# Patient Record
Sex: Male | Born: 1975
Health system: Southern US, Community
[De-identification: ages and names within clinical notes are randomized; demographics above are authoritative.]

## PROBLEM LIST (undated history)

## (undated) ENCOUNTER — Emergency Department (HOSPITAL_COMMUNITY): Discharge status: Against Medical Advice | Payer: Self-pay

## (undated) DIAGNOSIS — G4730 Sleep apnea, unspecified: Secondary | ICD-10-CM

## (undated) DIAGNOSIS — R519 Headache, unspecified: Secondary | ICD-10-CM

## (undated) DIAGNOSIS — N179 Acute kidney failure, unspecified: Secondary | ICD-10-CM

## (undated) DIAGNOSIS — F250 Schizoaffective disorder, bipolar type: Secondary | ICD-10-CM

## (undated) DIAGNOSIS — D352 Benign neoplasm of pituitary gland: Secondary | ICD-10-CM

## (undated) DIAGNOSIS — R569 Unspecified convulsions: Secondary | ICD-10-CM

## (undated) DIAGNOSIS — E119 Type 2 diabetes mellitus without complications: Secondary | ICD-10-CM

## (undated) DIAGNOSIS — I10 Essential (primary) hypertension: Secondary | ICD-10-CM

## (undated) DIAGNOSIS — F1910 Other psychoactive substance abuse, uncomplicated: Secondary | ICD-10-CM

## (undated) DIAGNOSIS — F209 Schizophrenia, unspecified: Secondary | ICD-10-CM

## (undated) DIAGNOSIS — B1920 Unspecified viral hepatitis C without hepatic coma: Secondary | ICD-10-CM

## (undated) DIAGNOSIS — M1990 Unspecified osteoarthritis, unspecified site: Secondary | ICD-10-CM

## (undated) DIAGNOSIS — R51 Headache: Secondary | ICD-10-CM

## (undated) DIAGNOSIS — E220 Acromegaly and pituitary gigantism: Secondary | ICD-10-CM

## (undated) HISTORY — PX: SKIN BIOPSY: SHX1

## (undated) HISTORY — DX: Type 2 diabetes mellitus without complications: E11.9

## (undated) HISTORY — DX: Benign neoplasm of pituitary gland: D35.2

## (undated) HISTORY — PX: PITUITARY SURGERY: SHX203

## (undated) HISTORY — DX: Unspecified osteoarthritis, unspecified site: M19.90

## (undated) HISTORY — DX: Sleep apnea, unspecified: G47.30

---

## 1993-08-30 DIAGNOSIS — F259 Schizoaffective disorder, unspecified: Secondary | ICD-10-CM

## 1993-08-30 DIAGNOSIS — G4730 Sleep apnea, unspecified: Secondary | ICD-10-CM

## 1993-08-30 HISTORY — DX: Sleep apnea, unspecified: G47.30

## 1993-08-30 HISTORY — DX: Schizoaffective disorder, unspecified: F25.9

## 2002-01-12 ENCOUNTER — Emergency Department: Admission: EM | Admit: 2002-01-12 | Discharge: 2002-01-13 | Payer: Self-pay | Admitting: *Deleted

## 2002-01-16 ENCOUNTER — Encounter: Payer: Self-pay | Admitting: *Deleted

## 2002-01-16 ENCOUNTER — Ambulatory Visit (HOSPITAL_COMMUNITY): Admission: RE | Admit: 2002-01-16 | Discharge: 2002-01-16 | Payer: Self-pay | Admitting: *Deleted

## 2002-01-22 ENCOUNTER — Encounter: Admission: RE | Admit: 2002-01-22 | Discharge: 2002-01-22 | Payer: Self-pay | Admitting: Internal Medicine

## 2002-02-12 ENCOUNTER — Encounter: Admission: RE | Admit: 2002-02-12 | Discharge: 2002-02-12 | Payer: Self-pay | Admitting: Internal Medicine

## 2002-03-01 ENCOUNTER — Encounter: Admission: RE | Admit: 2002-03-01 | Discharge: 2002-03-01 | Payer: Self-pay | Admitting: Internal Medicine

## 2002-04-11 ENCOUNTER — Encounter: Admission: RE | Admit: 2002-04-11 | Discharge: 2002-04-11 | Payer: Self-pay | Admitting: Internal Medicine

## 2002-05-27 ENCOUNTER — Emergency Department (HOSPITAL_COMMUNITY): Admission: EM | Admit: 2002-05-27 | Discharge: 2002-05-27 | Payer: Self-pay | Admitting: Emergency Medicine

## 2002-06-06 ENCOUNTER — Encounter: Admission: RE | Admit: 2002-06-06 | Discharge: 2002-06-06 | Payer: Self-pay | Admitting: Internal Medicine

## 2002-08-30 DIAGNOSIS — E220 Acromegaly and pituitary gigantism: Secondary | ICD-10-CM

## 2002-08-30 DIAGNOSIS — D352 Benign neoplasm of pituitary gland: Secondary | ICD-10-CM

## 2002-08-30 HISTORY — DX: Acromegaly and pituitary gigantism: E22.0

## 2002-08-30 HISTORY — DX: Benign neoplasm of pituitary gland: D35.2

## 2003-03-10 ENCOUNTER — Inpatient Hospital Stay (HOSPITAL_COMMUNITY): Admission: AD | Admit: 2003-03-10 | Discharge: 2003-03-12 | Payer: Self-pay | Admitting: Psychiatry

## 2003-06-26 ENCOUNTER — Emergency Department (HOSPITAL_COMMUNITY): Admission: EM | Admit: 2003-06-26 | Discharge: 2003-06-26 | Payer: Self-pay | Admitting: Emergency Medicine

## 2003-09-07 ENCOUNTER — Emergency Department (HOSPITAL_COMMUNITY): Admission: EM | Admit: 2003-09-07 | Discharge: 2003-09-07 | Payer: Self-pay | Admitting: Emergency Medicine

## 2005-04-30 ENCOUNTER — Ambulatory Visit: Payer: Self-pay | Admitting: Internal Medicine

## 2005-05-03 ENCOUNTER — Ambulatory Visit: Payer: Self-pay | Admitting: *Deleted

## 2005-12-08 DIAGNOSIS — E220 Acromegaly and pituitary gigantism: Secondary | ICD-10-CM | POA: Insufficient documentation

## 2005-12-23 DIAGNOSIS — E2740 Unspecified adrenocortical insufficiency: Secondary | ICD-10-CM | POA: Insufficient documentation

## 2008-08-30 DIAGNOSIS — E119 Type 2 diabetes mellitus without complications: Secondary | ICD-10-CM

## 2008-08-30 HISTORY — DX: Type 2 diabetes mellitus without complications: E11.9

## 2011-05-09 ENCOUNTER — Emergency Department (HOSPITAL_COMMUNITY): Payer: Self-pay

## 2011-05-09 ENCOUNTER — Emergency Department (HOSPITAL_COMMUNITY)
Admission: EM | Admit: 2011-05-09 | Discharge: 2011-05-09 | Disposition: A | Discharge status: Home / Self Care | Payer: Self-pay | Attending: Emergency Medicine | Admitting: Emergency Medicine

## 2011-05-09 DIAGNOSIS — IMO0001 Reserved for inherently not codable concepts without codable children: Secondary | ICD-10-CM | POA: Insufficient documentation

## 2011-05-09 DIAGNOSIS — I10 Essential (primary) hypertension: Secondary | ICD-10-CM | POA: Insufficient documentation

## 2011-05-09 DIAGNOSIS — R51 Headache: Secondary | ICD-10-CM | POA: Insufficient documentation

## 2011-05-09 DIAGNOSIS — N390 Urinary tract infection, site not specified: Secondary | ICD-10-CM | POA: Insufficient documentation

## 2011-05-09 DIAGNOSIS — E220 Acromegaly and pituitary gigantism: Secondary | ICD-10-CM | POA: Insufficient documentation

## 2011-05-09 DIAGNOSIS — G4730 Sleep apnea, unspecified: Secondary | ICD-10-CM | POA: Insufficient documentation

## 2011-05-09 DIAGNOSIS — E119 Type 2 diabetes mellitus without complications: Secondary | ICD-10-CM | POA: Insufficient documentation

## 2011-05-09 LAB — POCT I-STAT, CHEM 8
BUN: 14 mg/dL (ref 6–23)
Calcium, Ion: 1.22 mmol/L (ref 1.12–1.32)
Chloride: 108 mEq/L (ref 96–112)
Creatinine, Ser: 1 mg/dL (ref 0.50–1.35)
Glucose, Bld: 106 mg/dL — ABNORMAL HIGH (ref 70–99)
HCT: 44 % (ref 39.0–52.0)
Hemoglobin: 15 g/dL (ref 13.0–17.0)
Potassium: 4 mEq/L (ref 3.5–5.1)
Sodium: 143 mEq/L (ref 135–145)
TCO2: 24 mmol/L (ref 0–100)

## 2011-05-09 LAB — CBC
HCT: 40.1 % (ref 39.0–52.0)
Hemoglobin: 12.7 g/dL — ABNORMAL LOW (ref 13.0–17.0)
MCH: 23.2 pg — ABNORMAL LOW (ref 26.0–34.0)
MCHC: 31.7 g/dL (ref 30.0–36.0)
MCV: 73.2 fL — ABNORMAL LOW (ref 78.0–100.0)
Platelets: 201 10*3/uL (ref 150–400)
RBC: 5.48 MIL/uL (ref 4.22–5.81)
RDW: 14.7 % (ref 11.5–15.5)
WBC: 6.8 10*3/uL (ref 4.0–10.5)

## 2011-05-09 LAB — URINALYSIS, ROUTINE W REFLEX MICROSCOPIC
Glucose, UA: NEGATIVE mg/dL
Hgb urine dipstick: NEGATIVE
Ketones, ur: 15 mg/dL — AB
Nitrite: POSITIVE — AB
Protein, ur: NEGATIVE mg/dL
Specific Gravity, Urine: 1.035 — ABNORMAL HIGH (ref 1.005–1.030)
Urobilinogen, UA: 1 mg/dL (ref 0.0–1.0)
pH: 6 (ref 5.0–8.0)

## 2011-05-09 LAB — DIFFERENTIAL
Basophils Absolute: 0 10*3/uL (ref 0.0–0.1)
Basophils Relative: 1 % (ref 0–1)
Eosinophils Absolute: 0.2 10*3/uL (ref 0.0–0.7)
Eosinophils Relative: 3 % (ref 0–5)
Lymphocytes Relative: 27 % (ref 12–46)
Lymphs Abs: 1.8 10*3/uL (ref 0.7–4.0)
Monocytes Absolute: 0.5 10*3/uL (ref 0.1–1.0)
Monocytes Relative: 8 % (ref 3–12)
Neutro Abs: 4.2 10*3/uL (ref 1.7–7.7)
Neutrophils Relative %: 61 % (ref 43–77)

## 2011-05-09 LAB — URINE MICROSCOPIC-ADD ON

## 2011-06-29 ENCOUNTER — Emergency Department (HOSPITAL_COMMUNITY)
Admission: EM | Admit: 2011-06-29 | Discharge: 2011-06-29 | Disposition: A | Discharge status: Home / Self Care | Payer: Self-pay | Attending: Emergency Medicine | Admitting: Emergency Medicine

## 2011-06-29 ENCOUNTER — Emergency Department (HOSPITAL_COMMUNITY): Payer: Self-pay

## 2011-06-29 ENCOUNTER — Encounter (HOSPITAL_COMMUNITY): Payer: Self-pay | Admitting: Radiology

## 2011-06-29 DIAGNOSIS — E220 Acromegaly and pituitary gigantism: Secondary | ICD-10-CM | POA: Insufficient documentation

## 2011-06-29 DIAGNOSIS — E119 Type 2 diabetes mellitus without complications: Secondary | ICD-10-CM | POA: Insufficient documentation

## 2011-06-29 DIAGNOSIS — R51 Headache: Secondary | ICD-10-CM | POA: Insufficient documentation

## 2011-06-29 DIAGNOSIS — R42 Dizziness and giddiness: Secondary | ICD-10-CM | POA: Insufficient documentation

## 2011-06-29 DIAGNOSIS — I10 Essential (primary) hypertension: Secondary | ICD-10-CM | POA: Insufficient documentation

## 2011-06-29 DIAGNOSIS — Z79899 Other long term (current) drug therapy: Secondary | ICD-10-CM | POA: Insufficient documentation

## 2011-06-29 HISTORY — DX: Essential (primary) hypertension: I10

## 2011-06-29 HISTORY — DX: Acromegaly and pituitary gigantism: E22.0

## 2011-08-11 ENCOUNTER — Emergency Department (HOSPITAL_COMMUNITY)
Admission: EM | Admit: 2011-08-11 | Discharge: 2011-08-11 | Payer: Self-pay | Attending: Emergency Medicine | Admitting: Emergency Medicine

## 2011-08-11 ENCOUNTER — Encounter (HOSPITAL_COMMUNITY): Payer: Self-pay | Admitting: Emergency Medicine

## 2011-08-11 DIAGNOSIS — S61209A Unspecified open wound of unspecified finger without damage to nail, initial encounter: Secondary | ICD-10-CM | POA: Insufficient documentation

## 2011-08-11 DIAGNOSIS — S61219A Laceration without foreign body of unspecified finger without damage to nail, initial encounter: Secondary | ICD-10-CM

## 2011-08-11 DIAGNOSIS — R69 Illness, unspecified: Secondary | ICD-10-CM | POA: Insufficient documentation

## 2011-08-11 DIAGNOSIS — S41109A Unspecified open wound of unspecified upper arm, initial encounter: Secondary | ICD-10-CM | POA: Insufficient documentation

## 2011-08-11 DIAGNOSIS — IMO0002 Reserved for concepts with insufficient information to code with codable children: Secondary | ICD-10-CM | POA: Insufficient documentation

## 2011-08-11 DIAGNOSIS — S41119A Laceration without foreign body of unspecified upper arm, initial encounter: Secondary | ICD-10-CM

## 2011-08-11 MED ORDER — TETANUS-DIPHTH-ACELL PERTUSSIS 5-2.5-18.5 LF-MCG/0.5 IM SUSP
0.5000 mL | Freq: Once | INTRAMUSCULAR | Status: AC
  Administered 2011-08-11: 0.5 mL via INTRAMUSCULAR
  Filled 2011-08-11: qty 0.5
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
## 2011-08-11 NOTE — ED Notes (Signed)
MD at bedside. 
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
## 2011-08-12 ENCOUNTER — Emergency Department (HOSPITAL_COMMUNITY): Payer: Self-pay

## 2011-08-12 ENCOUNTER — Emergency Department (HOSPITAL_COMMUNITY)
Admission: EM | Admit: 2011-08-12 | Discharge: 2011-08-12 | Disposition: A | Discharge status: Home / Self Care | Payer: Self-pay | Attending: Emergency Medicine | Admitting: Emergency Medicine

## 2011-08-12 ENCOUNTER — Encounter (HOSPITAL_COMMUNITY): Payer: Self-pay | Admitting: *Deleted

## 2011-08-12 DIAGNOSIS — I10 Essential (primary) hypertension: Secondary | ICD-10-CM | POA: Insufficient documentation

## 2011-08-12 DIAGNOSIS — S8010XA Contusion of unspecified lower leg, initial encounter: Secondary | ICD-10-CM | POA: Insufficient documentation

## 2011-08-12 DIAGNOSIS — M25469 Effusion, unspecified knee: Secondary | ICD-10-CM | POA: Insufficient documentation

## 2011-08-12 DIAGNOSIS — IMO0002 Reserved for concepts with insufficient information to code with codable children: Secondary | ICD-10-CM | POA: Insufficient documentation

## 2011-08-12 DIAGNOSIS — S8990XA Unspecified injury of unspecified lower leg, initial encounter: Secondary | ICD-10-CM | POA: Insufficient documentation

## 2011-08-12 DIAGNOSIS — E119 Type 2 diabetes mellitus without complications: Secondary | ICD-10-CM | POA: Insufficient documentation

## 2011-08-12 DIAGNOSIS — Z86011 Personal history of benign neoplasm of the brain: Secondary | ICD-10-CM | POA: Insufficient documentation

## 2011-08-12 DIAGNOSIS — E220 Acromegaly and pituitary gigantism: Secondary | ICD-10-CM | POA: Insufficient documentation

## 2011-08-12 DIAGNOSIS — M25569 Pain in unspecified knee: Secondary | ICD-10-CM | POA: Insufficient documentation

## 2011-08-12 MED ORDER — HYDROCODONE-ACETAMINOPHEN 5-325 MG PO TABS: 1.0000 | ORAL_TABLET | Freq: Four times a day (QID) | ORAL | Status: DC | PRN
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
## 2011-08-12 NOTE — ED Notes (Signed)
Right knee swollen and painful, skin tear to the left upper arm, c/o left rib pain
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
## 2011-08-14 ENCOUNTER — Encounter (HOSPITAL_COMMUNITY): Payer: Self-pay

## 2011-08-14 ENCOUNTER — Emergency Department (HOSPITAL_COMMUNITY): Payer: Self-pay

## 2011-08-14 ENCOUNTER — Emergency Department (HOSPITAL_COMMUNITY)
Admission: EM | Admit: 2011-08-14 | Discharge: 2011-08-14 | Disposition: A | Discharge status: Home / Self Care | Payer: Self-pay | Attending: Emergency Medicine | Admitting: Emergency Medicine

## 2011-08-14 DIAGNOSIS — S8990XA Unspecified injury of unspecified lower leg, initial encounter: Secondary | ICD-10-CM | POA: Insufficient documentation

## 2011-08-14 DIAGNOSIS — M25569 Pain in unspecified knee: Secondary | ICD-10-CM | POA: Insufficient documentation

## 2011-08-14 DIAGNOSIS — M2540 Effusion, unspecified joint: Secondary | ICD-10-CM | POA: Insufficient documentation

## 2011-08-14 DIAGNOSIS — S8991XA Unspecified injury of right lower leg, initial encounter: Secondary | ICD-10-CM

## 2011-08-14 DIAGNOSIS — M7989 Other specified soft tissue disorders: Secondary | ICD-10-CM | POA: Insufficient documentation

## 2011-08-14 DIAGNOSIS — S99929A Unspecified injury of unspecified foot, initial encounter: Secondary | ICD-10-CM | POA: Insufficient documentation

## 2011-08-14 DIAGNOSIS — R079 Chest pain, unspecified: Secondary | ICD-10-CM | POA: Insufficient documentation

## 2011-08-14 DIAGNOSIS — S20219A Contusion of unspecified front wall of thorax, initial encounter: Secondary | ICD-10-CM | POA: Insufficient documentation

## 2011-08-14 DIAGNOSIS — E119 Type 2 diabetes mellitus without complications: Secondary | ICD-10-CM | POA: Insufficient documentation

## 2011-08-14 DIAGNOSIS — E220 Acromegaly and pituitary gigantism: Secondary | ICD-10-CM | POA: Insufficient documentation

## 2011-08-14 DIAGNOSIS — I10 Essential (primary) hypertension: Secondary | ICD-10-CM | POA: Insufficient documentation

## 2011-08-14 MED ORDER — OXYCODONE-ACETAMINOPHEN 5-325 MG PO TABS: 2.0000 | ORAL_TABLET | ORAL | Status: DC | PRN

## 2011-08-14 MED ORDER — OXYCODONE-ACETAMINOPHEN 5-325 MG PO TABS: 2.0000 | ORAL_TABLET | ORAL | Status: AC | PRN

## 2011-08-14 MED ORDER — OXYCODONE-ACETAMINOPHEN 5-325 MG PO TABS
2.0000 | ORAL_TABLET | Freq: Once | ORAL | Status: AC
  Administered 2011-08-14: 2 via ORAL
  Filled 2011-08-14: qty 2

## 2011-08-14 NOTE — ED Notes (Signed)
Dr Radford Pax aware pt called RN to room requesting Percocet 7.5mg  for pain.
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
## 2014-10-21 ENCOUNTER — Ambulatory Visit: Payer: Self-pay | Attending: Family Medicine | Admitting: Family Medicine

## 2014-10-21 ENCOUNTER — Encounter: Payer: Self-pay | Admitting: Family Medicine

## 2014-10-21 VITALS — BP 133/101 | HR 85 | Temp 98.2°F | Resp 20 | Ht 75.0 in | Wt 339.0 lb

## 2014-10-21 DIAGNOSIS — F32A Depression, unspecified: Secondary | ICD-10-CM

## 2014-10-21 DIAGNOSIS — M1990 Unspecified osteoarthritis, unspecified site: Secondary | ICD-10-CM

## 2014-10-21 DIAGNOSIS — IMO0002 Reserved for concepts with insufficient information to code with codable children: Secondary | ICD-10-CM | POA: Insufficient documentation

## 2014-10-21 DIAGNOSIS — G4730 Sleep apnea, unspecified: Secondary | ICD-10-CM

## 2014-10-21 DIAGNOSIS — E220 Acromegaly and pituitary gigantism: Secondary | ICD-10-CM

## 2014-10-21 DIAGNOSIS — Z6841 Body Mass Index (BMI) 40.0 and over, adult: Secondary | ICD-10-CM

## 2014-10-21 DIAGNOSIS — E119 Type 2 diabetes mellitus without complications: Secondary | ICD-10-CM

## 2014-10-21 DIAGNOSIS — F329 Major depressive disorder, single episode, unspecified: Secondary | ICD-10-CM

## 2014-10-21 DIAGNOSIS — IMO0001 Reserved for inherently not codable concepts without codable children: Secondary | ICD-10-CM

## 2014-10-21 DIAGNOSIS — R030 Elevated blood-pressure reading, without diagnosis of hypertension: Secondary | ICD-10-CM

## 2014-10-21 DIAGNOSIS — I10 Essential (primary) hypertension: Secondary | ICD-10-CM

## 2014-10-21 DIAGNOSIS — E1165 Type 2 diabetes mellitus with hyperglycemia: Secondary | ICD-10-CM | POA: Insufficient documentation

## 2014-10-21 LAB — LIPID PANEL
Cholesterol: 146 mg/dL (ref 0–200)
HDL: 33 mg/dL — ABNORMAL LOW (ref >=40)
LDL Cholesterol: 96 mg/dL (ref 0–99)
Total CHOL/HDL Ratio: 4.4 Ratio
Triglycerides: 84 mg/dL (ref <150)
VLDL: 17 mg/dL (ref 0–40)

## 2014-10-21 LAB — CBC
HCT: 43.9 % (ref 39.0–52.0)
Hemoglobin: 13.6 g/dL (ref 13.0–17.0)
MCH: 23.2 pg — ABNORMAL LOW (ref 26.0–34.0)
MCHC: 31 g/dL (ref 30.0–36.0)
MCV: 74.9 fL — ABNORMAL LOW (ref 78.0–100.0)
MPV: 10.1 fL (ref 8.6–12.4)
Platelets: 228 10*3/uL (ref 150–400)
RBC: 5.86 MIL/uL — ABNORMAL HIGH (ref 4.22–5.81)
RDW: 15.2 % (ref 11.5–15.5)
WBC: 7.5 10*3/uL (ref 4.0–10.5)

## 2014-10-21 LAB — COMPLETE METABOLIC PANEL WITH GFR
ALT: 17 U/L (ref 0–53)
AST: 19 U/L (ref 0–37)
Albumin: 4 g/dL (ref 3.5–5.2)
Alkaline Phosphatase: 54 U/L (ref 39–117)
BUN: 16 mg/dL (ref 6–23)
CO2: 25 mEq/L (ref 19–32)
Calcium: 9.2 mg/dL (ref 8.4–10.5)
Chloride: 103 mEq/L (ref 96–112)
Creat: 0.88 mg/dL (ref 0.50–1.35)
GFR, Est African American: >89 mL/min
GFR, Est Non African American: >89 mL/min
Glucose, Bld: 144 mg/dL — ABNORMAL HIGH (ref 70–99)
Potassium: 4.3 mEq/L (ref 3.5–5.3)
Sodium: 137 mEq/L (ref 135–145)
Total Bilirubin: 0.7 mg/dL (ref 0.2–1.2)
Total Protein: 6.3 g/dL (ref 6.0–8.3)

## 2014-10-21 LAB — POCT GLYCOSYLATED HEMOGLOBIN (HGB A1C): Hemoglobin A1C: 8.1

## 2014-10-21 LAB — GLUCOSE, POCT (MANUAL RESULT ENTRY): POC Glucose: 157 mg/dl — AB (ref 70–99)

## 2014-10-21 MED ORDER — GABAPENTIN 400 MG PO CAPS: 400.0000 mg | ORAL_CAPSULE | Freq: Three times a day (TID) | ORAL | Status: DC

## 2014-10-21 MED ORDER — PREDNISONE 5 MG PO TABS: 7.5000 mg | ORAL_TABLET | Freq: Every day | ORAL | Status: DC

## 2014-10-21 MED ORDER — CLONIDINE HCL 0.1 MG PO TABS
0.1000 mg | ORAL_TABLET | Freq: Once | ORAL | Status: AC
Start: 2014-10-21 — End: 2014-10-21
  Administered 2014-10-21: 0.1 mg via ORAL

## 2014-10-21 MED ORDER — FUROSEMIDE 40 MG PO TABS: 40.0000 mg | ORAL_TABLET | Freq: Every day | ORAL | Status: DC

## 2014-10-21 MED ORDER — LOSARTAN POTASSIUM 50 MG PO TABS: 50.0000 mg | ORAL_TABLET | Freq: Every day | ORAL | Status: DC

## 2014-10-21 MED ORDER — METFORMIN HCL 500 MG PO TABS: 500.0000 mg | ORAL_TABLET | Freq: Two times a day (BID) | ORAL | Status: DC

## 2014-10-21 MED ORDER — SERTRALINE HCL 100 MG PO TABS: 100.0000 mg | ORAL_TABLET | Freq: Every day | ORAL | Status: DC

## 2014-10-21 MED ORDER — VERAPAMIL HCL ER 240 MG PO TBCR: 240.0000 mg | EXTENDED_RELEASE_TABLET | Freq: Every day | ORAL | Status: DC

## 2014-10-21 MED ORDER — MELOXICAM 15 MG PO TABS: 15.0000 mg | ORAL_TABLET | Freq: Every day | ORAL | Status: DC

## 2014-10-21 NOTE — Assessment & Plan Note (Signed)
Sleep apnea: will obtain records. Getting a new machine w/o insurance will take some time. Please apply for [employer] discount and orange card.

## 2014-10-21 NOTE — Assessment & Plan Note (Addendum)
DM2: A1c goal < 7 Increase metformin to 500 mg twice daily after meals Low carb diet Regular exercise

## 2014-10-21 NOTE — Assessment & Plan Note (Signed)
Patient signed release for medical records

## 2014-10-21 NOTE — Patient Instructions (Addendum)
Mr. Nicolosi,  Thank you for coming in today. It was a pleasure meeting you. I look forward to being your primary doctor.  1. HTN: BP goal < 140/90 Continue lasix, verapamil Add losartan 50 mg daily for BP control If needed I will add back the clonidine since you took it before.   2. DM2: A1c goal < 7 Increase metformin to 500 mg twice daily after meals Low carb diet Regular exercise   3. Sleep apnea: will obtain records. Getting a new machine w/o insurance will take some time. Please apply for [employer] discount and orange card.   Please apply for [employer] discount and orange card, you can also inquire if any of your medications are on the PASS (medications assistance) list.  Abilify is on PASS, so please get a PASS application from the pharmacy.   F/u in 2 weeks for RN BP check F/u with me in 6 weeks  Dr. Adrian Blackwater

## 2014-10-21 NOTE — Assessment & Plan Note (Signed)
HTN: BP goal < 140/90 Continue lasix, verapamil Add losartan 50 mg daily for BP control If needed I will add back the clonidine since you took it before.

## 2014-10-21 NOTE — Progress Notes (Signed)
   Subjective:    Patient ID: Duane Salazar, male    DOB: Mar 13, 1976, 39 y.o.   MRN: [id number] CC: establish care, continue medications, need CPAPhx of acromegaly, DJD, HTN, DM2 HPI 38 yo M establish care:  1. 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 Diagnosis and treatment in [place]. Records at [facility]. Patient on chronic steroid therapy with prednisone 7.5 mg daily since treatment.   2. DM2: taking metformin 500 mg daily. Denies HA, CP, SOB. Has polyuria and polydipsia at times.   3. HTN: taking lasix 40 mg daily and verapamil 240 mg daily. Also took clonidine 0.3 mg BID while incarcerated. ROS as per above.   4. Sleep apnea: without CPAP since release from jail 2 weeks ago.   Soc Hx: current smoker 8 cigs per day  Med Hx: acromegaly dx in 2004  Fam Hx: cancer of unknown type in maternal uncle and MGM Review of Systems As per HPI     Objective:   Physical Exam BP 136/101 mmHg  Pulse 79  Temp(Src) 98.2 F (36.8 C)  Resp 20  Ht 6\' 3"  (1.905 m)  Wt 339 lb (153.769 kg)  BMI 42.37 kg/m2  SpO2 99% General appearance: alert, cooperative and no distress  Lungs: clear to auscultation bilaterally Heart: regular rate and rhythm, S1, S2 normal, no murmur, click, rub or gallop Extremities: extremities normal, atraumatic, no cyanosis or edema  Treated HTN with clonidine 0.1 mg x 1, repeat 133/101 Lab Results  Component Value Date   HGBA1C 8.1 10/21/2014   CBG 152    Assessment & Plan:

## 2014-10-22 LAB — MICROALBUMIN / CREATININE URINE RATIO
Creatinine, Urine: 238 mg/dL
Microalb Creat Ratio: 16 mg/g (ref 0.0–30.0)
Microalb, Ur: 3.8 mg/dL — ABNORMAL HIGH (ref <2.0)

## 2014-10-22 MED ORDER — ATORVASTATIN CALCIUM 40 MG PO TABS: 40.0000 mg | ORAL_TABLET | Freq: Every day | ORAL | Status: DC

## 2014-10-22 NOTE — Addendum Note (Signed)
Addended by: Boykin Nearing on: 10/22/2014 11:17 AM   Modules accepted: Orders

## 2014-10-24 ENCOUNTER — Telehealth: Payer: Self-pay | Admitting: *Deleted

## 2014-10-24 NOTE — Telephone Encounter (Signed)
-----   Message from Minerva Ends, MD sent at 10/22/2014 11:16 AM EST ----- Normal urine microalbumin, Normal hgb with slightly low MCV, ? Iron deficiency  Recommend adding statin therapy, otherwise continue current medication regimen

## 2014-10-24 NOTE — Telephone Encounter (Signed)
Left voice message with male to return call 

## 2014-10-25 NOTE — Telephone Encounter (Signed)
Pt aware of lab results 

## 2014-11-12 ENCOUNTER — Ambulatory Visit: Payer: Self-pay

## 2014-12-19 ENCOUNTER — Ambulatory Visit: Payer: Self-pay | Attending: Family Medicine | Admitting: *Deleted

## 2014-12-19 ENCOUNTER — Other Ambulatory Visit: Payer: Self-pay

## 2014-12-19 ENCOUNTER — Telehealth: Payer: Self-pay | Admitting: Family Medicine

## 2014-12-19 VITALS — BP 148/107 | HR 81 | Temp 98.0°F | Resp 16 | Wt 322.6 lb

## 2014-12-19 DIAGNOSIS — R55 Syncope and collapse: Secondary | ICD-10-CM | POA: Insufficient documentation

## 2014-12-19 LAB — POCT URINALYSIS DIPSTICK
Bilirubin, UA: NEGATIVE
Blood, UA: NEGATIVE
Glucose, UA: NEGATIVE
Ketones, UA: NEGATIVE
Leukocytes, UA: NEGATIVE
Nitrite, UA: NEGATIVE
Protein, UA: NEGATIVE
Spec Grav, UA: 1.015
Urobilinogen, UA: 0.2
pH, UA: 6

## 2014-12-19 LAB — GLUCOSE, POCT (MANUAL RESULT ENTRY): POC Glucose: 105 mg/dl — AB (ref 70–99)

## 2014-12-19 NOTE — Telephone Encounter (Signed)
Pt returning call for nurse, pt not sure who called him.  Please f/u with pt, it is regarding upcoming imaging appt.
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
## 2014-12-19 NOTE — Patient Instructions (Addendum)
DASH Eating Plan DASH stands for "Dietary Approaches to Stop Hypertension." The DASH eating plan is a healthy eating plan that has been shown to reduce high blood pressure (hypertension). Additional health benefits may include reducing the risk of type 2 diabetes mellitus, heart disease, and stroke. The DASH eating plan may also help with weight loss. WHAT DO I NEED TO KNOW ABOUT THE DASH EATING PLAN? For the DASH eating plan, you will follow these general guidelines:  Choose foods with a percent daily value for sodium of less than 5% (as listed on the food label).  Use salt-free seasonings or herbs instead of table salt or sea salt.  Check with your health care provider or pharmacist before using salt substitutes.  Eat lower-sodium products, often labeled as "lower sodium" or "no salt added."  Eat fresh foods.  Eat more vegetables, fruits, and low-fat dairy products.  Choose whole grains. Look for the word "whole" as the first word in the ingredient list.  Choose fish and skinless chicken or turkey more often than red meat. Limit fish, poultry, and meat to 6 oz (170 g) each day.  Limit sweets, desserts, sugars, and sugary drinks.  Choose heart-healthy fats.  Limit cheese to 1 oz (28 g) per day.  Eat more home-cooked food and less restaurant, buffet, and fast food.  Limit fried foods.  Cook foods using methods other than frying.  Limit canned vegetables. If you do use them, rinse them well to decrease the sodium.  When eating at a restaurant, ask that your food be prepared with less salt, or no salt if possible. WHAT FOODS CAN I EAT? Seek help from a dietitian for individual calorie needs. Grains Whole grain or whole wheat bread. Brown rice. Whole grain or whole wheat pasta. Quinoa, bulgur, and whole grain cereals. Low-sodium cereals. Corn or whole wheat flour tortillas. Whole grain cornbread. Whole grain crackers. Low-sodium crackers. Vegetables Fresh or frozen vegetables  (raw, steamed, roasted, or grilled). Low-sodium or reduced-sodium tomato and vegetable juices. Low-sodium or reduced-sodium tomato sauce and paste. Low-sodium or reduced-sodium canned vegetables.  Fruits All fresh, canned (in natural juice), or frozen fruits. Meat and Other Protein Products Ground beef (85% or leaner), grass-fed beef, or beef trimmed of fat. Skinless chicken or turkey. Ground chicken or turkey. Pork trimmed of fat. All fish and seafood. Eggs. Dried beans, peas, or lentils. Unsalted nuts and seeds. Unsalted canned beans. Dairy Low-fat dairy products, such as skim or 1% milk, 2% or reduced-fat cheeses, low-fat ricotta or cottage cheese, or plain low-fat yogurt. Low-sodium or reduced-sodium cheeses. Fats and Oils Tub margarines without trans fats. Light or reduced-fat mayonnaise and salad dressings (reduced sodium). Avocado. Safflower, olive, or canola oils. Natural peanut or almond butter. Other Unsalted popcorn and pretzels. The items listed above may not be a complete list of recommended foods or beverages. Contact your dietitian for more options. WHAT FOODS ARE NOT RECOMMENDED? Grains White bread. White pasta. White rice. Refined cornbread. Bagels and croissants. Crackers that contain trans fat. Vegetables Creamed or fried vegetables. Vegetables in a cheese sauce. Regular canned vegetables. Regular canned tomato sauce and paste. Regular tomato and vegetable juices. Fruits Dried fruits. Canned fruit in light or heavy syrup. Fruit juice. Meat and Other Protein Products Fatty cuts of meat. Ribs, chicken wings, bacon, sausage, bologna, salami, chitterlings, fatback, hot dogs, bratwurst, and packaged luncheon meats. Salted nuts and seeds. Canned beans with salt. Dairy Whole or 2% milk, cream, half-and-half, and cream cheese. Whole-fat or sweetened yogurt. Full-fat   cheeses or blue cheese. Nondairy creamers and whipped toppings. Processed cheese, cheese spreads, or cheese  curds. Condiments Onion and garlic salt, seasoned salt, table salt, and sea salt. Canned and packaged gravies. Worcestershire sauce. Tartar sauce. Barbecue sauce. Teriyaki sauce. Soy sauce, including reduced sodium. Steak sauce. Fish sauce. Oyster sauce. Cocktail sauce. Horseradish. Ketchup and mustard. Meat flavorings and tenderizers. Bouillon cubes. Hot sauce. Tabasco sauce. Marinades. Taco seasonings. Relishes. Fats and Oils Butter, stick margarine, lard, shortening, ghee, and bacon fat. Coconut, palm kernel, or palm oils. Regular salad dressings. Other Pickles and olives. Salted popcorn and pretzels. The items listed above may not be a complete list of foods and beverages to avoid. Contact your dietitian for more information. WHERE CAN I FIND MORE INFORMATION? National Heart, Lung, and Blood Institute: travelstabloid.com Document Released: 08/05/2011 Document Revised: 12/31/2013 Document Reviewed: 06/20/2013 [facility] Patient Information 2015 [place], [place]. This information is not intended to replace advice given to you by your health care provider. Make sure you discuss any questions you have with your health care provider. Syncope Syncope is a medical term for fainting or passing out. This means you lose consciousness and drop to the ground. People are generally unconscious for less than 5 minutes. You may have some muscle twitches for up to 15 seconds before waking up and returning to normal. Syncope occurs more often in older adults, but it can happen to anyone. While most causes of syncope are not dangerous, syncope can be a sign of a serious medical problem. It is important to seek medical care.  CAUSES  Syncope is caused by a sudden drop in blood flow to the brain. The specific cause is often not determined. Factors that can bring on syncope include:  Taking medicines that lower blood pressure.  Sudden changes in posture, such as standing up  quickly.  Taking more medicine than prescribed.  Standing in one place for too long.  Seizure disorders.  Dehydration and excessive exposure to heat.  Low blood sugar (hypoglycemia).  Straining to have a bowel movement.  Heart disease, irregular heartbeat, or other circulatory problems.  Fear, emotional distress, seeing blood, or severe pain. SYMPTOMS  Right before fainting, you may:  Feel dizzy or light-headed.  Feel nauseous.  See all white or all black in your field of vision.  Have cold, clammy skin. DIAGNOSIS  Your health care provider will ask about your symptoms, perform a physical exam, and perform an electrocardiogram (ECG) to record the electrical activity of your heart. Your health care provider may also perform other heart or blood tests to determine the cause of your syncope which may include:  Transthoracic echocardiogram (TTE). During echocardiography, sound waves are used to evaluate how blood flows through your heart.  Transesophageal echocardiogram (TEE).  Cardiac monitoring. This allows your health care provider to monitor your heart rate and rhythm in real time.  Holter monitor. This is a portable device that records your heartbeat and can help diagnose heart arrhythmias. It allows your health care provider to track your heart activity for several days, if needed.  Stress tests by exercise or by giving medicine that makes the heart beat faster. TREATMENT  In most cases, no treatment is needed. Depending on the cause of your syncope, your health care provider may recommend changing or stopping some of your medicines. HOME CARE INSTRUCTIONS  Have someone stay with you until you feel stable.  Do not drive, use machinery, or play sports until your health care provider says it is okay.  Keep all follow-up appointments as directed by your health care provider.  Lie down right away if you start feeling like you might faint. Breathe deeply and steadily.  Wait until all the symptoms have passed.  Drink enough fluids to keep your urine clear or pale yellow.  If you are taking blood pressure or heart medicine, get up slowly and take several minutes to sit and then stand. This can reduce dizziness. SEEK IMMEDIATE MEDICAL CARE IF:   You have a severe headache.  You have unusual pain in the chest, abdomen, or back.  You are bleeding from your mouth or rectum, or you have black or tarry stool.  You have an irregular or very fast heartbeat.  You have pain with breathing.  You have repeated fainting or seizure-like jerking during an episode.  You faint when sitting or lying down.  You have confusion.  You have trouble walking.  You have severe weakness.  You have vision problems. If you fainted, call your local emergency services (911 in [place] Do not drive yourself to the hospital.  MAKE SURE YOU:  Understand these instructions.  Will watch your condition.  Will get help right away if you are not doing well or get worse. Document Released: 08/16/2005 Document Revised: 08/21/2013 Document Reviewed: 10/15/2011 [facility] Patient Information 2015 [place], [place]. This information is not intended to replace advice given to you by your health care provider. Make sure you discuss any questions you have with your health care provider.

## 2014-12-20 ENCOUNTER — Ambulatory Visit (HOSPITAL_COMMUNITY)
Admission: RE | Admit: 2014-12-20 | Discharge: 2014-12-20 | Disposition: A | Discharge status: Home / Self Care | Payer: Self-pay | Source: Ambulatory Visit | Attending: Family Medicine | Admitting: Family Medicine

## 2014-12-20 DIAGNOSIS — E119 Type 2 diabetes mellitus without complications: Secondary | ICD-10-CM | POA: Insufficient documentation

## 2014-12-20 DIAGNOSIS — R55 Syncope and collapse: Secondary | ICD-10-CM | POA: Insufficient documentation

## 2014-12-20 DIAGNOSIS — Z720 Tobacco use: Secondary | ICD-10-CM | POA: Insufficient documentation

## 2014-12-20 DIAGNOSIS — E220 Acromegaly and pituitary gigantism: Secondary | ICD-10-CM | POA: Insufficient documentation

## 2014-12-20 DIAGNOSIS — I10 Essential (primary) hypertension: Secondary | ICD-10-CM | POA: Insufficient documentation

## 2014-12-23 ENCOUNTER — Telehealth: Payer: Self-pay | Admitting: Clinical

## 2014-12-23 ENCOUNTER — Emergency Department (HOSPITAL_COMMUNITY): Payer: Self-pay

## 2014-12-23 ENCOUNTER — Emergency Department (HOSPITAL_COMMUNITY)
Admission: EM | Admit: 2014-12-23 | Discharge: 2014-12-23 | Disposition: A | Discharge status: Home / Self Care | Payer: Self-pay | Attending: Emergency Medicine | Admitting: Emergency Medicine

## 2014-12-23 ENCOUNTER — Encounter (HOSPITAL_COMMUNITY): Payer: Self-pay | Admitting: *Deleted

## 2014-12-23 DIAGNOSIS — R110 Nausea: Secondary | ICD-10-CM | POA: Insufficient documentation

## 2014-12-23 DIAGNOSIS — I10 Essential (primary) hypertension: Secondary | ICD-10-CM | POA: Insufficient documentation

## 2014-12-23 DIAGNOSIS — E119 Type 2 diabetes mellitus without complications: Secondary | ICD-10-CM | POA: Insufficient documentation

## 2014-12-23 DIAGNOSIS — Z7952 Long term (current) use of systemic steroids: Secondary | ICD-10-CM | POA: Insufficient documentation

## 2014-12-23 DIAGNOSIS — R51 Headache: Secondary | ICD-10-CM | POA: Insufficient documentation

## 2014-12-23 DIAGNOSIS — R55 Syncope and collapse: Secondary | ICD-10-CM | POA: Insufficient documentation

## 2014-12-23 DIAGNOSIS — I951 Orthostatic hypotension: Secondary | ICD-10-CM

## 2014-12-23 DIAGNOSIS — Z79899 Other long term (current) drug therapy: Secondary | ICD-10-CM | POA: Insufficient documentation

## 2014-12-23 DIAGNOSIS — R200 Anesthesia of skin: Secondary | ICD-10-CM

## 2014-12-23 DIAGNOSIS — Z720 Tobacco use: Secondary | ICD-10-CM | POA: Insufficient documentation

## 2014-12-23 DIAGNOSIS — G4733 Obstructive sleep apnea (adult) (pediatric): Secondary | ICD-10-CM | POA: Insufficient documentation

## 2014-12-23 DIAGNOSIS — Z8739 Personal history of other diseases of the musculoskeletal system and connective tissue: Secondary | ICD-10-CM | POA: Insufficient documentation

## 2014-12-23 DIAGNOSIS — Z791 Long term (current) use of non-steroidal anti-inflammatories (NSAID): Secondary | ICD-10-CM | POA: Insufficient documentation

## 2014-12-23 DIAGNOSIS — Z9981 Dependence on supplemental oxygen: Secondary | ICD-10-CM | POA: Insufficient documentation

## 2014-12-23 LAB — I-STAT TROPONIN, ED: Troponin i, poc: 0 ng/mL (ref 0.00–0.08)

## 2014-12-23 LAB — I-STAT CHEM 8, ED
BUN: 12 mg/dL (ref 6–23)
Calcium, Ion: 1.11 mmol/L — ABNORMAL LOW (ref 1.12–1.23)
Chloride: 101 mmol/L (ref 96–112)
Creatinine, Ser: 1.6 mg/dL — ABNORMAL HIGH (ref 0.50–1.35)
Glucose, Bld: 140 mg/dL — ABNORMAL HIGH (ref 70–99)
HCT: 44 % (ref 39.0–52.0)
Hemoglobin: 15 g/dL (ref 13.0–17.0)
Potassium: 3.5 mmol/L (ref 3.5–5.1)
Sodium: 136 mmol/L (ref 135–145)
TCO2: 15 mmol/L (ref 0–100)

## 2014-12-23 LAB — URINALYSIS, ROUTINE W REFLEX MICROSCOPIC
Bilirubin Urine: NEGATIVE
Glucose, UA: NEGATIVE mg/dL
Hgb urine dipstick: NEGATIVE
Ketones, ur: NEGATIVE mg/dL
Leukocytes, UA: NEGATIVE
Nitrite: NEGATIVE
Protein, ur: NEGATIVE mg/dL
Specific Gravity, Urine: 1.01 (ref 1.005–1.030)
Urobilinogen, UA: 0.2 mg/dL (ref 0.0–1.0)
pH: 5.5 (ref 5.0–8.0)

## 2014-12-23 LAB — COMPREHENSIVE METABOLIC PANEL
ALT: 33 U/L (ref 0–53)
AST: 25 U/L (ref 0–37)
Albumin: 3.3 g/dL — ABNORMAL LOW (ref 3.5–5.2)
Alkaline Phosphatase: 38 U/L — ABNORMAL LOW (ref 39–117)
Anion gap: 12 (ref 5–15)
BUN: 11 mg/dL (ref 6–23)
CO2: 18 mmol/L — ABNORMAL LOW (ref 19–32)
Calcium: 8.1 mg/dL — ABNORMAL LOW (ref 8.4–10.5)
Chloride: 103 mmol/L (ref 96–112)
Creatinine, Ser: 1.59 mg/dL — ABNORMAL HIGH (ref 0.50–1.35)
GFR calc Af Amer: 61 mL/min — ABNORMAL LOW (ref >90)
GFR calc non Af Amer: 53 mL/min — ABNORMAL LOW (ref >90)
Glucose, Bld: 138 mg/dL — ABNORMAL HIGH (ref 70–99)
Potassium: 3.6 mmol/L (ref 3.5–5.1)
Sodium: 133 mmol/L — ABNORMAL LOW (ref 135–145)
Total Bilirubin: 0.3 mg/dL (ref 0.3–1.2)
Total Protein: 5.3 g/dL — ABNORMAL LOW (ref 6.0–8.3)

## 2014-12-23 LAB — CBC
HCT: 40 % (ref 39.0–52.0)
Hemoglobin: 12.9 g/dL — ABNORMAL LOW (ref 13.0–17.0)
MCH: 23.9 pg — ABNORMAL LOW (ref 26.0–34.0)
MCHC: 32.3 g/dL (ref 30.0–36.0)
MCV: 74.2 fL — ABNORMAL LOW (ref 78.0–100.0)
Platelets: 198 10*3/uL (ref 150–400)
RBC: 5.39 MIL/uL (ref 4.22–5.81)
RDW: 15.1 % (ref 11.5–15.5)
WBC: 9.9 10*3/uL (ref 4.0–10.5)

## 2014-12-23 LAB — PROTIME-INR
INR: 1.02 (ref 0.00–1.49)
Prothrombin Time: 13.5 seconds (ref 11.6–15.2)

## 2014-12-23 LAB — DIFFERENTIAL
Basophils Absolute: 0 10*3/uL (ref 0.0–0.1)
Basophils Relative: 0 % (ref 0–1)
Eosinophils Absolute: 0.2 10*3/uL (ref 0.0–0.7)
Eosinophils Relative: 2 % (ref 0–5)
Lymphocytes Relative: 33 % (ref 12–46)
Lymphs Abs: 3.2 10*3/uL (ref 0.7–4.0)
Monocytes Absolute: 0.9 10*3/uL (ref 0.1–1.0)
Monocytes Relative: 9 % (ref 3–12)
Neutro Abs: 5.5 10*3/uL (ref 1.7–7.7)
Neutrophils Relative %: 56 % (ref 43–77)

## 2014-12-23 LAB — ETHANOL: Alcohol, Ethyl (B): 70 mg/dL — ABNORMAL HIGH (ref 0–9)

## 2014-12-23 LAB — CBG MONITORING, ED: Glucose-Capillary: 118 mg/dL — ABNORMAL HIGH (ref 70–99)

## 2014-12-23 LAB — APTT: aPTT: 27 seconds (ref 24–37)

## 2014-12-23 MED ORDER — KETOROLAC TROMETHAMINE 30 MG/ML IJ SOLN
30.0000 mg | Freq: Once | INTRAMUSCULAR | Status: AC
  Administered 2014-12-23: 30 mg via INTRAVENOUS
  Filled 2014-12-23: qty 1

## 2014-12-23 MED ORDER — ONDANSETRON HCL 4 MG/2ML IJ SOLN
4.0000 mg | Freq: Once | INTRAMUSCULAR | Status: AC
  Administered 2014-12-23: 4 mg via INTRAVENOUS
  Filled 2014-12-23: qty 2

## 2014-12-23 MED ORDER — SODIUM CHLORIDE 0.9 % IV BOLUS (SEPSIS)
1000.0000 mL | Freq: Once | INTRAVENOUS | Status: AC
  Administered 2014-12-23: 1000 mL via INTRAVENOUS

## 2014-12-23 MED ORDER — METOCLOPRAMIDE HCL 5 MG/ML IJ SOLN
10.0000 mg | Freq: Once | INTRAMUSCULAR | Status: AC
  Administered 2014-12-23: 10 mg via INTRAVENOUS
  Filled 2014-12-23: qty 2

## 2014-12-23 NOTE — ED Notes (Signed)
While at bedside with pt. Pt hr noted to decrease to 50. BP systolic at 70. Pt began to vomit. Pt had a period of unresponsiveness lasting approx 20 seconds. Pt then able to answer questions and follow commands. Pt vomited once after episode as well. MD aware.

## 2014-12-23 NOTE — Consult Note (Signed)
Neurology Consultation Reason for Consult: Right sided numbness Referring Physician: Kathrynn Humble, A  CC: Right sided numbness, headache  History is obtained from:patient   HPI: Duane Salazar is a 39 y.o. male with syncope earlier this evening and found to have severely low systolic during a period of unresponsiveness here(systolic 70). He was complaining of bilateral leg tingling and right sided numbness/weakness as well and a code stroke was called. He has had several episodes of syncope lately.   Of note, he has a history of pituitary tumor with acromegaly.   He is currently complaining of bifrontal headache with photophobia as well as nausea and vomiting.    LKW: 4pm tpa given?: no, mild symptoms, IC mass    ROS: A 14 point ROS was performed and is negative except as noted in the HPI.   Past Medical History  Diagnosis Date  . Brain tumor (benign) 2004  . Hypertension   . Acromegaly 2004  . Sleep apnea 1995    on CPAP  . Arthritis   . Diabetes type 2, controlled 2010    Family History: No hx similar  Social History: Tob: current smoker  Exam: Current vital signs: BP 105/55 mmHg  Pulse 63  Temp(Src) 97.9 F (36.6 C)  Resp 16  Ht 6\' 3"  (1.905 m)  Wt 151.048 kg (333 lb)  BMI 41.62 kg/m2  SpO2 92% Vital signs in last 24 hours: Temp:  [97.9 F (36.6 C)] 97.9 F (36.6 C) (04/25 1850) Pulse Rate:  [57-64] 63 (04/25 1930) Resp:  [16-18] 16 (04/25 1930) BP: (92-105)/(50-66) 105/55 mmHg (04/25 1930) SpO2:  [92 %-96 %] 92 % (04/25 1930) Weight:  [151.048 kg (333 lb)] 151.048 kg (333 lb) (04/25 1850)  Physical Exam  Constitutional: Appears well-developed and well-nourished.  Psych: Affect appropriate to situation Eyes: No scleral injection HENT: No OP obstrucion Head: Normocephalic.  Cardiovascular: Normal rate and regular rhythm.  Respiratory: Effort normal and breath sounds normal to anterior ascultation GI: Soft.  No distension. There is no tenderness.  Skin:  WDI  Neuro: Mental Status: Patient is awake, alert, oriented to person, place, month, situation. Patient is able to give a clear and coherent history. No signs of aphasia or neglect Cranial Nerves: II: Visual Fields are full. Pupils are equal, round, and reactive to light.   III,IV, VI: He refuses to look to the left, but when his head is turned causing oculocephalic to the left, he then does saccade to the left as well.  V: Facial sensation is diminished on the right.  VII: Facial movement is intact VIII: hearing is intact to voice X: Uvula elevates symmetrically XI: Shoulder shrug is symmetric. XII: tongue is midline without atrophy or fasciculations.  Motor: Tone is normal. Bulk is normal. He has giev way weakness of the right arm and bilateral legs, unable to lift his right arm, but then when doing FNF, is able to perform normally(including lifting the right arm).  Sensory: Sensation is diminished on the right.  Cerebellar: FNF  intact bilaterally   I have reviewed labs in epic and the results pertinent to this consultation are: Bmp - elevated creatingin  I have reviewed the images obtained: CT head - hyperdense pontine lesion CT 2012 - the hyperdense lesion seen on tonight's CT was present on a study form 08/12/2011(Image #11)   Impression: 39 yo M with inconsistent exam findings which could be suggestive of embellishment, but the description of his headache could suggest a possible complicated migraine. This would not  explain hypotension, but given the unresponsiveness happened in the setting of hypotension, I would strongly suspect that his syncope was related to this rather than a primary neurological cause. His headache could be orthostatic headache as well.   Given the presence on imaging in 2012, I strongly suspect that the lesion seen on tonight's CT is not acute or related to his current presentation.   I do not suspect acute ischemia or TIA as causative in the  patient's current symptoms.   Recommendations: 1) Could treat with compazine/benadryl for complicated migraine.  2) MRI brain with thin cut gradient images through the brainstem.  3) No further neurological workup needed if MRI is negative, if further neurological questions remain, please call.  4) Defer treatment/workup of hypotension to ER physicians.   Roland Rack, MD [facility] [phone]  If 7pm- 7am, please page neurology on call as listed in [place].

## 2014-12-23 NOTE — ED Notes (Signed)
Pt here from home , pt had a syncopal episode at home , pt also had one episode of vomiting

## 2014-12-23 NOTE — Code Documentation (Addendum)
Mr. Doren is a 39yo bm presenting to the [facility] via [facility] for syncope and hypotension.  He was found to have Rt side weakness,exp. Aphasia, & vertical nystagmus by the EDP.  He was taken to CT scan & a code stroke was called. LKW 1600.  On assessment by the stroke team he was found to have fluid speech, predominately rt side sensory deficit though he stated both his feet felt numb.  He stated it was difficult to hold his eyes open due to his HA that felt like a steel band across his eyes and forehead.  He initially had no difficulty holding his left leg up, but upon reassessment he said he had difficulty with both legs due to pain. NIH 4, plan MRI. He stated he recently got out of prison and has not had a physician since then.  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 Hx of HTN, acromegaly, s/p trans sphenoidal pituitary resection, HLD, & T2DM.

## 2014-12-23 NOTE — Telephone Encounter (Signed)
Pt inquired about missing med(Abilify) and CPAP availability/ [facility] recommends pt call pharmacy to obtain meds; he states he will come in to see [facility] in one week to fill out CPAP application. Pt is aware that CPAP machine will cost $100 if additional funding is unavailable.

## 2014-12-23 NOTE — ED Notes (Addendum)
Pt taken to CT by [facility] RN. Rapid response at bedside.

## 2014-12-23 NOTE — Discharge Instructions (Signed)
STOP DRINKING ALCOHOL.  HYDRATE PROPERLY.  Syncope Syncope is a medical term for fainting or passing out. This means you lose consciousness and drop to the ground. People are generally unconscious for less than 5 minutes. You may have some muscle twitches for up to 15 seconds before waking up and returning to normal. Syncope occurs more often in older adults, but it can happen to anyone. While most causes of syncope are not dangerous, syncope can be a sign of a serious medical problem. It is important to seek medical care.  CAUSES  Syncope is caused by a sudden drop in blood flow to the brain. The specific cause is often not determined. Factors that can bring on syncope include:  Taking medicines that lower blood pressure.  Sudden changes in posture, such as standing up quickly.  Taking more medicine than prescribed.  Standing in one place for too long.  Seizure disorders.  Dehydration and excessive exposure to heat.  Low blood sugar (hypoglycemia).  Straining to have a bowel movement.  Heart disease, irregular heartbeat, or other circulatory problems.  Fear, emotional distress, seeing blood, or severe pain. SYMPTOMS  Right before fainting, you may:  Feel dizzy or light-headed.  Feel nauseous.  See all white or all black in your field of vision.  Have cold, clammy skin. DIAGNOSIS  Your health care provider will ask about your symptoms, perform a physical exam, and perform an electrocardiogram (ECG) to record the electrical activity of your heart. Your health care provider may also perform other heart or blood tests to determine the cause of your syncope which may include:  Transthoracic echocardiogram (TTE). During echocardiography, sound waves are used to evaluate how blood flows through your heart.  Transesophageal echocardiogram (TEE).  Cardiac monitoring. This allows your health care provider to monitor your heart rate and rhythm in real time.  Holter monitor. This is  a portable device that records your heartbeat and can help diagnose heart arrhythmias. It allows your health care provider to track your heart activity for several days, if needed.  Stress tests by exercise or by giving medicine that makes the heart beat faster. TREATMENT  In most cases, no treatment is needed. Depending on the cause of your syncope, your health care provider may recommend changing or stopping some of your medicines. HOME CARE INSTRUCTIONS  Have someone stay with you until you feel stable.  Do not drive, use machinery, or play sports until your health care provider says it is okay.  Keep all follow-up appointments as directed by your health care provider.  Lie down right away if you start feeling like you might faint. Breathe deeply and steadily. Wait until all the symptoms have passed.  Drink enough fluids to keep your urine clear or pale yellow.  If you are taking blood pressure or heart medicine, get up slowly and take several minutes to sit and then stand. This can reduce dizziness. SEEK IMMEDIATE MEDICAL CARE IF:   You have a severe headache.  You have unusual pain in the chest, abdomen, or back.  You are bleeding from your mouth or rectum, or you have black or tarry stool.  You have an irregular or very fast heartbeat.  You have pain with breathing.  You have repeated fainting or seizure-like jerking during an episode.  You faint when sitting or lying down.  You have confusion.  You have trouble walking.  You have severe weakness.  You have vision problems. If you fainted, call your local emergency  services (911 in [place] Do not drive yourself to the hospital.  MAKE SURE YOU:  Understand these instructions.  Will watch your condition.  Will get help right away if you are not doing well or get worse. Document Released: 08/16/2005 Document Revised: 08/21/2013 Document Reviewed: 10/15/2011 [facility] Patient Information 2015 [place], [place]. This  information is not intended to replace advice given to you by your health care provider. Make sure you discuss any questions you have with your health care provider.

## 2014-12-23 NOTE — ED Notes (Signed)
Pt did receive 500 NS bolus from EMS

## 2014-12-23 NOTE — ED Notes (Signed)
Pt back to the ED

## 2014-12-23 NOTE — Telephone Encounter (Signed)
First attempt to contact pt about CPAP application

## 2014-12-23 NOTE — ED Notes (Signed)
Pt arrived to CT with RN 

## 2014-12-23 NOTE — ED Provider Notes (Signed)
CSN: [id number]     Arrival date & time 12/23/14  1842 History   First MD Initiated Contact with Patient 12/23/14 1903     Chief Complaint  Patient presents with  . Code Stroke    An emergency department physician performed an initial assessment on this suspected stroke patient at 18. (Consider location/radiation/quality/duration/timing/severity/associated sxs/prior Treatment) HPI   This is a 39 year old male, with a history of benign pituitary adenoma, acromegaly, diabetes, hypertension, recently released from jail, initially presenting with syncope. This happened 3 hours ago, at home, in his front yard.  It lasted an estimated 1 minute. He states that he drank half of a 40 ounce beer at that time. He presents now with dizziness, lightheadedness, right-sided sensory deficit. He states that all this started 3 hours ago, is persistent. He has taken no new medicines for this. He denies chest pain, shortness breath, abdominal pain. Positive for nausea. Negative for frank weakness focally. Positive for aphasia.  Past Medical History  Diagnosis Date  . Brain tumor (benign) 2004  . Hypertension   . Acromegaly 2004  . Sleep apnea 1995    on CPAP  . Arthritis   . Diabetes type 2, controlled 2010   Past Surgical History  Procedure Laterality Date  . Pituitary surgery     Family History  Problem Relation Age of Onset  . Cancer Maternal Uncle   . Cancer Maternal Grandmother   . Diabetes Neg Hx   . Heart disease Neg Hx    History  Substance Use Topics  . Smoking status: Current Every Day Smoker -- 0.50 packs/day for 20 years    Types: Cigarettes  . Smokeless tobacco: Never Used     Comment: Smoking .5 ppd  . Alcohol Use: 1.2 oz/week    0 Standard drinks or equivalent, 2 Cans of beer per week    Review of Systems  Constitutional: Negative for fever and chills.  HENT: Negative for facial swelling.   Eyes: Negative for pain and visual disturbance.  Respiratory: Negative for chest  tightness and shortness of breath.   Cardiovascular: Negative for chest pain.  Gastrointestinal: Positive for nausea.  Genitourinary: Negative for dysuria.  Musculoskeletal: Negative for myalgias and arthralgias.  Neurological: Positive for syncope and headaches.  Psychiatric/Behavioral: Negative for confusion.      Allergies  Review of patient's allergies indicates no known allergies.  Home Medications   Prior to Admission medications   Medication Sig Start Date End Date Taking? Authorizing Provider  ARIPiprazole (ABILIFY) 10 MG tablet Take 10 mg by mouth daily.   Yes Historical Provider, MD  atorvastatin (LIPITOR) 40 MG tablet Take 1 tablet (40 mg total) by mouth daily. 10/22/14  Yes Josalyn Funches, MD  gabapentin (NEURONTIN) 400 MG capsule Take 1 capsule (400 mg total) by mouth 3 (three) times daily. 10/21/14  Yes Josalyn Funches, MD  losartan (COZAAR) 50 MG tablet Take 1 tablet (50 mg total) by mouth daily. 10/21/14  Yes Josalyn Funches, MD  meloxicam (MOBIC) 15 MG tablet Take 1 tablet (15 mg total) by mouth daily. 10/21/14  Yes Boykin Nearing, MD  metFORMIN (GLUCOPHAGE) 500 MG tablet Take 1 tablet (500 mg total) by mouth 2 (two) times daily after a meal. 10/21/14  Yes Josalyn Funches, MD  OLANZapine (ZYPREXA) 5 MG tablet Take 5 mg by mouth at bedtime.   Yes Historical Provider, MD  predniSONE (DELTASONE) 5 MG tablet Take 1.5 tablets (7.5 mg total) by mouth daily with breakfast. 10/21/14  Yes Josalyn Funches,  MD  sertraline (ZOLOFT) 100 MG tablet Take 1 tablet (100 mg total) by mouth daily. 10/21/14  Yes Josalyn Funches, MD  verapamil (CALAN-SR) 240 MG CR tablet Take 1 tablet (240 mg total) by mouth daily. 10/21/14  Yes Josalyn Funches, MD   BP 118/59 mmHg  Pulse 66  Temp(Src) 98.1 F (36.7 C) (Oral)  Resp 18  Ht 6\' 3"  (1.905 m)  Wt 333 lb (151.048 kg)  BMI 41.62 kg/m2  SpO2 98% Physical Exam  Constitutional: He is oriented to person, place, and time. He appears well-developed  and well-nourished. No distress.  HENT:  Head: Normocephalic and atraumatic.  Mouth/Throat: No oropharyngeal exudate.  Eyes: Conjunctivae are normal. Pupils are equal, round, and reactive to light. No scleral icterus.  Neck: Normal range of motion. No tracheal deviation present. No thyromegaly present.  Cardiovascular: Normal rate, regular rhythm and normal heart sounds.  Exam reveals no gallop and no friction rub.   No murmur heard. Pulmonary/Chest: Effort normal and breath sounds normal. No stridor. No respiratory distress. He has no wheezes. He has no rales. He exhibits no tenderness.  Abdominal: Soft. He exhibits no distension and no mass. There is no tenderness. There is no rebound and no guarding.  Musculoskeletal: Normal range of motion. He exhibits no edema.  Neurological: He is alert and oriented to person, place, and time. He has normal strength. GCS eye subscore is 4. GCS verbal subscore is 5. GCS motor subscore is 6.  Reflex Scores:      Patellar reflexes are 2+ on the right side and 2+ on the left side. Positive for vertical nystagmus  Positive for sensory deficit to the right face, right upper cavity, right lower extremity  Positive for ataxia  Skin: Skin is warm and dry. He is not diaphoretic.    ED Course  Procedures (including critical care time) Labs Review Labs Reviewed  ETHANOL - Abnormal; Notable for the following:    Alcohol, Ethyl (B) 70 (*)    All other components within normal limits  CBC - Abnormal; Notable for the following:    Hemoglobin 12.9 (*)    MCV 74.2 (*)    MCH 23.9 (*)    All other components within normal limits  COMPREHENSIVE METABOLIC PANEL - Abnormal; Notable for the following:    Sodium 133 (*)    CO2 18 (*)    Glucose, Bld 138 (*)    Creatinine, Ser 1.59 (*)    Calcium 8.1 (*)    Total Protein 5.3 (*)    Albumin 3.3 (*)    Alkaline Phosphatase 38 (*)    GFR calc non Af Amer 53 (*)    GFR calc Af Amer 61 (*)    All other  components within normal limits  I-STAT CHEM 8, ED - Abnormal; Notable for the following:    Creatinine, Ser 1.60 (*)    Glucose, Bld 140 (*)    Calcium, Ion 1.11 (*)    All other components within normal limits  CBG MONITORING, ED - Abnormal; Notable for the following:    Glucose-Capillary 118 (*)    All other components within normal limits  URINALYSIS, ROUTINE W REFLEX MICROSCOPIC  PROTIME-INR  APTT  DIFFERENTIAL  URINE RAPID DRUG SCREEN (HOSP PERFORMED)  I-STAT TROPOININ, ED  I-STAT TROPOININ, ED  I-STAT TROPOININ, ED    Imaging Review Ct Head Wo Contrast  12/23/2014   CLINICAL DATA:  Code stroke with right-sided weakness and nausea. History of benign pituitary tumor 2000  for post surgery.  EXAM: CT HEAD WITHOUT CONTRAST  TECHNIQUE: Contiguous axial images were obtained from the base of the skull through the vertex without intravenous contrast.  COMPARISON:  12/20/2014 and 08/12/2011  FINDINGS: Ventricles, cisterns and other CSF spaces are within normal. Possible old lacunar infarct over the region inferior to the left lentiform nucleus unchanged from 2012. No evidence of focal mass, mass effect or shift of midline structures. No evidence of acute infarction. There is a tiny round 3-4 mm hyperdense focus over the junction of the right midbrain to pons. This has Hounsfield unit measurements of 50. This is not seen on the prior exams as cannot exclude a small focus of hemorrhage.  Postsurgical change compatible with prior pituitary tumor resection. Small air-fluid level over the right maxillary sinus.  IMPRESSION: Tiny round 3-4 mm hyperdense focus over the junction of the right midbrain to pons not well seen on the prior exams as cannot exclude a small focus of acute hemorrhage. Recommend repeat CT in 24 hours versus MR for further evaluation.  Postsurgical change compatible previous pituitary tumor resection.  Small air-fluid level over the right maxillary sinus which may be due to trauma  versus acute sinusitis.  Critical Value/emergent results were called by telephone at the time of interpretation on 12/23/2014 at 8:05 pm to Dr. Jennye Moccasin , who verbally acknowledged these results.   Electronically Signed   By: Marin Olp M.D.   On: 12/23/2014 20:08   Mr Brain Wo Contrast  12/23/2014   CLINICAL DATA:  39 year old diabetic hypertensive male with acromegaly and pituitary tumor (surgery 2000 with radiation) presenting with right-sided numbness and nausea. Initial encounter.  EXAM: MRI HEAD WITHOUT CONTRAST  TECHNIQUE: Multiplanar, multiecho pulse sequences of the brain and surrounding structures were obtained without intravenous contrast.  COMPARISON:  12/23/2014 CT.  01/16/2002 MR.  FINDINGS: No hemorrhage detected involving the right midbrain to pons as questioned on recent CT.  No acute infarct.  Prior pituitary surgery. Within the expanded sella, the infundibulum is draped to the right and there is soft tissue along the peripheral aspect on the right. This may represent postoperative changes/residual pituitary tissue although prior exams are not available to determine if this represents a change. Stability can be confirmed on follow-up.  Exophthalmos.  Major intracranial vascular structures are patent.  Smaller fluid level right maxillary sinus. Minimal mucosal thickening ethmoid sinus air cells.  Cervical medullary junction and pineal region unremarkable.  IMPRESSION: No hemorrhage detected involving the right midbrain to pons as questioned on recent CT.  No acute infarct.  Prior pituitary surgery. Within the expanded sella, the infundibulum is draped to the right and there is soft tissue along the peripheral aspect on the right. This may represent postoperative changes/residual pituitary tissue although prior exams are not available to determine if this represents a change. Stability can be confirmed on follow-up.  Exophthalmos.  Smaller fluid level right maxillary sinus.   Electronically  Signed   By: Genia Del M.D.   On: 12/23/2014 21:05     EKG Interpretation   Date/Time:  Monday December 23 2014 18:58:11 EDT Ventricular Rate:  60 PR Interval:  188 QRS Duration: 96 QT Interval:  430 QTC Calculation: 430 R Axis:   21 Text Interpretation:  Sinus rhythm normal intervals no acute changes  Confirmed by Kathrynn Humble, MD, Thelma Comp [phone]) on 12/23/2014 7:33:24 PM      MDM   Final diagnoses:  Orthostatic syncope    This is a 39 year old male, with  a history of benign pituitary adenoma, acromegaly, diabetes, hypertension, recently released from jail, initially presenting with syncope. This happened 3 hours ago, at home, in his front yard.  It lasted an estimated 1 minute. He states that he drank half of a 40 ounce beer at that time. He presents now with dizziness, lightheadedness, right-sided sensory deficit. He states that all this started 3 hours ago, is persistent. He has taken no new medicines for this. He denies chest pain, shortness breath, abdominal pain. Positive for nausea. Negative for frank weakness focally. Positive for aphasia.  On examination, the patient is mildly hypotensive. Remainder of vital signs are within normal limits. Patient has positive test askew, vertical nystagmus, ataxic gait, right-sided sensory deficit. Of call code stroke. Blood glucose was in the 200s. Systolic blood pressure is responding well to fluids. Patient is been taken to CT scan.  Negative for acute abnormalities on CT scan, with the exception of undifferentiated area, possibly bleed. Labs are pertinent for alcohol elevation, mild AKI. Negative for additional concerning acute abdomen allergies. Neurology recommends MRI of the brain. If within normal limits, patient is stable for discharge.  MRI is negative for possible intracranial hemorrhage appreciated on CT scan. Negative for acute infarct. Positive for normal postoperative changes associated with resection of pituitary adenoma. After  fluids, patient is now I symptomatically. I believe that this presentation represented Koplik's migraine versus orthostatic syncope. Patient ambulatory without assistance, without competition.  Pt stable for discharge, FU.  All questions answered.  Return precautions given.  I have discussed case and care has been guided by my attending physician, Dr. Kathrynn Humble.  Doy Hutching, MD 12/24/14 0025  Varney Biles, MD 12/24/14 [phone]

## 2014-12-24 ENCOUNTER — Telehealth: Payer: Self-pay | Admitting: *Deleted

## 2014-12-24 LAB — RAPID URINE DRUG SCREEN, HOSP PERFORMED
Amphetamines: NOT DETECTED
Barbiturates: NOT DETECTED
Benzodiazepines: NOT DETECTED
Cocaine: NOT DETECTED
Opiates: NOT DETECTED
Tetrahydrocannabinol: NOT DETECTED

## 2014-12-24 NOTE — Telephone Encounter (Signed)
Pt mom call stating Duane Salazar was in the Ed yesterday with Sx stroke Today he has drainage from his nose yellow color  Advised If Sx persist go to ER

## 2014-12-24 NOTE — Telephone Encounter (Signed)
Pt advised to go to ED or Urgent care if nose still draining  Was transfer to front office for appointment

## 2014-12-25 ENCOUNTER — Telehealth: Payer: Self-pay | Admitting: *Deleted

## 2014-12-25 ENCOUNTER — Other Ambulatory Visit: Payer: Self-pay | Admitting: Family Medicine

## 2014-12-25 DIAGNOSIS — G4730 Sleep apnea, unspecified: Secondary | ICD-10-CM

## 2014-12-25 NOTE — Telephone Encounter (Signed)
Unable to contact pt. No answer.

## 2014-12-25 NOTE — Telephone Encounter (Signed)
-----   Message from Boykin Nearing, MD sent at 12/23/2014  9:03 AM EDT ----- Stable CT head. No bleed of change in previous pituitary resection

## 2015-01-02 ENCOUNTER — Ambulatory Visit: Payer: Self-pay | Attending: Family Medicine | Admitting: Family Medicine

## 2015-01-02 ENCOUNTER — Encounter: Payer: Self-pay | Admitting: Family Medicine

## 2015-01-02 VITALS — BP 130/88 | HR 75 | Temp 98.4°F | Resp 16 | Ht 75.0 in | Wt 317.0 lb

## 2015-01-02 DIAGNOSIS — J320 Chronic maxillary sinusitis: Secondary | ICD-10-CM

## 2015-01-02 DIAGNOSIS — M7661 Achilles tendinitis, right leg: Secondary | ICD-10-CM

## 2015-01-02 DIAGNOSIS — Z8639 Personal history of other endocrine, nutritional and metabolic disease: Secondary | ICD-10-CM | POA: Insufficient documentation

## 2015-01-02 DIAGNOSIS — E893 Postprocedural hypopituitarism: Secondary | ICD-10-CM

## 2015-01-02 DIAGNOSIS — Z9889 Other specified postprocedural states: Secondary | ICD-10-CM

## 2015-01-02 DIAGNOSIS — R55 Syncope and collapse: Secondary | ICD-10-CM | POA: Insufficient documentation

## 2015-01-02 DIAGNOSIS — I10 Essential (primary) hypertension: Secondary | ICD-10-CM

## 2015-01-02 DIAGNOSIS — D352 Benign neoplasm of pituitary gland: Secondary | ICD-10-CM | POA: Insufficient documentation

## 2015-01-02 DIAGNOSIS — R519 Headache, unspecified: Secondary | ICD-10-CM

## 2015-01-02 DIAGNOSIS — R51 Headache: Secondary | ICD-10-CM | POA: Insufficient documentation

## 2015-01-02 DIAGNOSIS — T671XXD Heat syncope, subsequent encounter: Secondary | ICD-10-CM

## 2015-01-02 DIAGNOSIS — E119 Type 2 diabetes mellitus without complications: Secondary | ICD-10-CM

## 2015-01-02 DIAGNOSIS — B351 Tinea unguium: Secondary | ICD-10-CM

## 2015-01-02 DIAGNOSIS — G8929 Other chronic pain: Secondary | ICD-10-CM

## 2015-01-02 LAB — POCT GLYCOSYLATED HEMOGLOBIN (HGB A1C): Hemoglobin A1C: 6.7

## 2015-01-02 LAB — GLUCOSE, POCT (MANUAL RESULT ENTRY): POC Glucose: 149 mg/dl — AB (ref 70–99)

## 2015-01-02 MED ORDER — ACETAMINOPHEN-CODEINE #3 300-30 MG PO TABS: 1.0000 | ORAL_TABLET | Freq: Three times a day (TID) | ORAL | Status: DC | PRN

## 2015-01-02 MED ORDER — AMOXICILLIN-POT CLAVULANATE 500-125 MG PO TABS: 1.0000 | ORAL_TABLET | Freq: Three times a day (TID) | ORAL | Status: DC

## 2015-01-02 MED ORDER — AMOXICILLIN-POT CLAVULANATE 875-125 MG PO TABS: 1.0000 | ORAL_TABLET | Freq: Two times a day (BID) | ORAL | Status: DC

## 2015-01-02 MED ORDER — TRIAMCINOLONE ACETONIDE 55 MCG/ACT NA AERO: 2.0000 | INHALATION_SPRAY | Freq: Every day | NASAL | Status: DC

## 2015-01-02 MED ORDER — VERAPAMIL HCL ER 180 MG PO TBCR: 180.0000 mg | EXTENDED_RELEASE_TABLET | Freq: Every day | ORAL | Status: DC

## 2015-01-02 NOTE — Progress Notes (Signed)
Error

## 2015-01-02 NOTE — Progress Notes (Signed)
F/U MRI results, syncope  Stated had another episode since last visit  No injury

## 2015-01-02 NOTE — Assessment & Plan Note (Signed)
.   R achilles tendon pain: Referral to sports medicine for ultrasound Stop mobic Tylenol #3 for pain

## 2015-01-02 NOTE — Patient Instructions (Addendum)
Duane Salazar,   Thank you for coming in today.  1. Dizziness with HTN: Decrease verapamil to 180 mg daily if taking verapamil only. If you are also taking losartan, STOP losartan and continue verapamil 240 mg daily.   2 . Sinusitis: R maxillary on CT head Augmentin x 3 weeks Continue current prednisone dose Add nasacort   3. R achilles tendon pain: Referral to sports medicine for ultrasound  4. Pituitary adenoma resection now with dizziness, headache, seizure-like activity  Neurosurgery referral placed   5. Diabetes: well controlled   6. Toenail fungus: will be treated after we treat sinusitis and more pressing symptoms.   Tyelenol #3 to replace mobic for pain. Cannot do tramadol with zoloft (can lower seizure threshold)   F/u in 2 weeks with RN for BP check bring all meds  F/u with me in 6 weeks for dizziness   Dr. Adrian Blackwater

## 2015-01-02 NOTE — Assessment & Plan Note (Signed)
Pituitary adenoma resection now with dizziness, headache, ? seizure-like activity. Stable neuroimaging  Neurosurgery referral placed

## 2015-01-02 NOTE — Progress Notes (Signed)
   Subjective:    Patient ID: Duane Salazar, male    DOB: 23-Nov-1975, 39 y.o.   MRN: [id number] CC: f/u syncope, R sided weakness  HPI  1. Syncope: x 2 episodes. While standing. Patient feels dizzy. Mom witnessed last episode, said patient with clenched his fist and was foaming at the mouth. Has CT head x 2 negative for new mass, lesion, growth in residual pituitary tissues. Had MRI head x 1, negative for blood. CT head did reveal R maxillary sinusitis. No fever. Has frontal headache.  2. HTN: taking verapamil 240 mg daily. Losartan on med list but patient believes he is not taking it. Does not feel dizzy when sitting or lying down. Only when standing.   3. R Achilles tendon (AT) pain: reports torn or injured R AT while in prison during basketball injury.  No intervention. Pain is daily. Worse with walking. No improvement with mobic. Associated with weakened plantar flexion.   Soc Hx: no ETOH  Review of Systems  Constitutional: Negative for fever and chills.  Respiratory: Negative for shortness of breath.   Cardiovascular: Negative for chest pain.  Neurological: Positive for headaches.       Objective:   Physical Exam BP 130/88 mmHg  Pulse 75  Temp(Src) 98.4 F (36.9 C) (Oral)  Resp 16  Ht 6\' 3"  (1.905 m)  Wt 317 lb (143.79 kg)  BMI 39.62 kg/m2  SpO2 99%  Wt Readings from Last 3 Encounters:  01/02/15 317 lb (143.79 kg)  12/23/14 333 lb (151.048 kg)  12/19/14 322 lb 9.6 oz (146.33 kg)  General appearance: alert, cooperative and no distress Head: Normocephalic, without obvious abnormality, atraumatic Eyes: conjunctivae/corneas clear. PERRL, EOM's intact.  Ears: cerumen in both ears  Lungs: clear to auscultation bilaterally Heart: regular rate and rhythm, S1, S2 normal, no murmur, click, rub or gallop Extremities: extremities normal, atraumatic, no cyanosis or edema, fullness in R distal AT   Lab Results  Component Value Date   HGBA1C 8.1 10/21/2014   CBG 149       Assessment & Plan:

## 2015-01-02 NOTE — Assessment & Plan Note (Signed)
Dizziness with HTN: Decrease verapamil to 180 mg daily if taking verapamil only. If you are also taking losartan, STOP losartan and continue verapamil 240 mg daily.

## 2015-01-02 NOTE — Assessment & Plan Note (Signed)
Sinusitis: R maxillary on CT head Augmentin x 3 weeks Continue current prednisone dose Add nasacort   3. R achilles tendon pain: Referral to sports medicine for ultrasound  4. Pituitary adenoma resection now with dizziness, headache, seizure-like activity  Neurosurgery referral placed

## 2015-01-02 NOTE — Assessment & Plan Note (Signed)
Diabetes well controlled. 

## 2015-01-02 NOTE — Assessment & Plan Note (Signed)
Toenail fungus: will be treated after we treat sinusitis and more pressing symptoms.

## 2015-01-15 ENCOUNTER — Ambulatory Visit: Payer: Self-pay

## 2015-01-15 ENCOUNTER — Ambulatory Visit: Payer: Self-pay | Admitting: Sports Medicine

## 2015-01-20 ENCOUNTER — Ambulatory Visit: Payer: Self-pay | Attending: Family Medicine

## 2015-03-09 ENCOUNTER — Ambulatory Visit (HOSPITAL_BASED_OUTPATIENT_CLINIC_OR_DEPARTMENT_OTHER): Payer: Self-pay | Attending: Family Medicine | Admitting: Radiology

## 2015-03-09 VITALS — Ht 75.0 in | Wt 306.0 lb

## 2015-03-09 DIAGNOSIS — G4730 Sleep apnea, unspecified: Secondary | ICD-10-CM

## 2015-03-09 DIAGNOSIS — R0683 Snoring: Secondary | ICD-10-CM | POA: Insufficient documentation

## 2015-03-11 ENCOUNTER — Ambulatory Visit (INDEPENDENT_AMBULATORY_CARE_PROVIDER_SITE_OTHER): Payer: Self-pay | Admitting: Neurology

## 2015-03-11 ENCOUNTER — Encounter: Payer: Self-pay | Admitting: Neurology

## 2015-03-11 VITALS — BP 130/94 | HR 87 | Resp 18 | Ht 75.0 in | Wt 299.0 lb

## 2015-03-11 DIAGNOSIS — R55 Syncope and collapse: Secondary | ICD-10-CM | POA: Insufficient documentation

## 2015-03-11 DIAGNOSIS — D352 Benign neoplasm of pituitary gland: Secondary | ICD-10-CM | POA: Insufficient documentation

## 2015-03-11 DIAGNOSIS — E220 Acromegaly and pituitary gigantism: Secondary | ICD-10-CM

## 2015-03-11 DIAGNOSIS — G894 Chronic pain syndrome: Secondary | ICD-10-CM

## 2015-03-11 NOTE — Patient Instructions (Signed)
1. Schedule routine EEG 2. Refer to Pain Medicine for chronic pain 3. As per [place] driving laws, after an episode of loss of consciousness, one should not drive until 6 months event-free 4. Follow-up in 6 months, call for any changes

## 2015-03-11 NOTE — Progress Notes (Signed)
NEUROLOGY CONSULTATION NOTE  Duane Salazar MRN: [id number] DOB: 06-12-1976  Referring provider: Dr. Boykin Nearing Primary care provider: Dr. Boykin Nearing  Reason for consult:  Seizures, syncope  Dear Dr Adrian Blackwater:  Thank you for your kind referral of Duane Salazar for consultation of the above symptoms. Although his history is well known to you, please allow me to reiterate it for the purpose of our medical record.Records and images were personally reviewed where available.  HISTORY OF PRESENT ILLNESS: This is a 39 year old left-handed man with a history of pituitary macroadenoma with acromegaly s/p resection x 2 (2005 and 2012), schizoaffective disorder, hypertension, diabetes, sleep apnea, presenting for concern of seizure last 12/23/14. He has a history of loss of consciousness, the first episode led to the diagnosis of the pituitary macroadenoma in 2003. He had been doing well until last 12/17/14 when he passed out after standing up to get off the city bus. He recalls feeling dizzy that time. He has been diagnosed with orthostatic hypotension, and BP medications are currently being adjusted. He had another episode of loss of consciousness on 12/23/14, however this time he was sitting on a chair and did not have any premonitory symptoms. He cannot recall feeling dizzy, he woke up in the ER with right-sided weakness. On ER exam, he was noted to have sensory deficit in the right face, right arm and leg, vertical nystagmus and ataxia. He was also reporting lightheadedness and headache. He tells me witnesses had told him he looked funny then started shaking and vomiting, however I do not see this mentioned on ER records. He always feels sleep-deprived and will be getting his CPAP machine soon. He denies any alcohol intake, but per ER notes he drank half of a 40-ounce beer at that time. He denies any further episodes of loss of consciousness in the past 2-1/2 months.   He denies any prior history  of seizures. He has been told he occasionally stares off, but denies any gaps in time. He denies any olfactory/gustatory hallucinations, deja vu, rising epigastric sensation, myoclonic jerks.He has had chronic daily headaches for the past 12 years, with frontal throbbing pain usually 6 or 7 over 10 in intensity, some photophobia, no nausea/vomiting/visual obscurations. He still feels dizzy, mostly when changing from sitting to standing. He denies any blurred vision, diplopia, no further focal weakness but has occasional tingling in both hands. He mostly has joint pains and bone spurs/bone growth in his extremities. He reports being followed at [facility] until he was incarcerated. He reports being prescribed Methadone for pain at [facility] (mostly joint pain in knees/arthritis pain), but was taken off when he was incarcerated. He has now been prescribed Tylenol #3 by his PCP. He was started on gabapentin by Behavioral [facility], then continued by his PCP for pain. No bowel/bladder dysfunction, he denies any neck/back pain. He reports having a normal birth and early development. He was diagnosed with acromegaly at age 54 or 66 per patient. There is no history of febrile convulsions, CNS infections such as meningitis/encephalitis, significant traumatic brain injury, or family history of seizures.  MRI: I personally reviewed MRI brain without contrast done 12/23/14 which did not show any acute changes, hippocampi symmetric, there was prior pituitary surgery with expanded sella, the infundibulum is draped to the right and there is soft tissue along the peripheral aspect on the right. This may represent postoperative changes/residual pituitary tissue although prior exams are not available to determine if this represents a change. Stability can  be confirmed on follow-up.   PAST MEDICAL HISTORY: Past Medical History  Diagnosis Date  . Brain tumor (benign) 2004  . Hypertension Dx 2002  . Acromegaly 2004  . Sleep apnea 1995     on CPAP  . Arthritis Dx 2002  . Diabetes type 2, controlled 2010    PAST SURGICAL HISTORY: Past Surgical History  Procedure Laterality Date  . Pituitary surgery  2005 & 2012    MEDICATIONS: Current Outpatient Prescriptions on File Prior to Visit  Medication Sig Dispense Refill  . acetaminophen-codeine (TYLENOL #3) 300-30 MG per tablet Take 1 tablet by mouth every 8 (eight) hours as needed for moderate pain. 60 tablet 2  . ARIPiprazole (ABILIFY) 10 MG tablet Take 10 mg by mouth daily.    Marland Kitchen atorvastatin (LIPITOR) 40 MG tablet Take 1 tablet (40 mg total) by mouth daily. 90 tablet 3  . gabapentin (NEURONTIN) 400 MG capsule Take 1 capsule (400 mg total) by mouth 3 (three) times daily. 270 capsule 1  . metFORMIN (GLUCOPHAGE) 500 MG tablet Take 1 tablet (500 mg total) by mouth 2 (two) times daily after a meal. 180 tablet 1  . OLANZapine (ZYPREXA) 5 MG tablet Take 5 mg by mouth at bedtime.    . predniSONE (DELTASONE) 5 MG tablet Take 1.5 tablets (7.5 mg total) by mouth daily with breakfast. 135 tablet 1  . sertraline (ZOLOFT) 100 MG tablet Take 1 tablet (100 mg total) by mouth daily. 90 tablet 1  . verapamil (CALAN-SR) 180 MG CR tablet Take 1 tablet (180 mg total) by mouth at bedtime. 30 tablet 5   No current facility-administered medications on file prior to visit.    ALLERGIES: No Known Allergies  FAMILY HISTORY: Family History  Problem Relation Age of Onset  . Cancer Maternal Uncle   . Cancer Maternal Grandmother   . Heart disease Neg Hx   . Hypertension Mother   . Diabetes Mother     SOCIAL HISTORY: History   Social History  . Marital Status: Married    Spouse Name: N/A  . Number of Children: 2  . Years of Education: GED   Occupational History  . Not on file.   Social History Main Topics  . Smoking status: Current Every Day Smoker -- 0.50 packs/day for 20 years    Types: Cigarettes  . Smokeless tobacco: Never Used     Comment: Smoking .5 ppd  . Alcohol Use: 1.2  oz/week    0 Standard drinks or equivalent, 2 Cans of beer per week  . Drug Use: No  . Sexual Activity: Yes   Other Topics Concern  . Not on file   Social History Narrative   Lives with mom.   Incarcerated for 22 months in [place], [place]. From 2014-09/2014    REVIEW OF SYSTEMS: Constitutional: No fevers, chills, or sweats, no generalized fatigue, change in appetite Eyes: No visual changes, double vision, eye pain Ear, nose and throat: No hearing loss, ear pain, nasal congestion, sore throat Cardiovascular: No chest pain, palpitations Respiratory:  No shortness of breath at rest or with exertion, wheezes GastrointestinaI: No nausea, vomiting, diarrhea, abdominal pain, fecal incontinence Genitourinary:  No dysuria, urinary retention or frequency Musculoskeletal:  No neck pain, back pain Integumentary: No rash, pruritus, skin lesions Neurological: as above Psychiatric: No depression, insomnia, anxiety Endocrine: No palpitations, fatigue, diaphoresis, mood swings, change in appetite, change in weight, increased thirst Hematologic/Lymphatic:  No anemia, purpura, petechiae. Allergic/Immunologic: no itchy/runny eyes, nasal congestion, recent allergic reactions,  rashes  PHYSICAL EXAM: Filed Vitals:   03/11/15 1354  BP: 130/94  Pulse: 87  Resp: 18   General: No acute distress, acromegalic facies Head:  Normocephalic/atraumatic Eyes: Fundoscopic exam shows bilateral sharp discs, no vessel changes, exudates, or hemorrhages Neck: supple, no paraspinal tenderness, full range of motion Back: No paraspinal tenderness Heart: regular rate and rhythm Lungs: Clear to auscultation bilaterally. Vascular: No carotid bruits. Skin/Extremities: No rash, no edema Neurological Exam: Mental status: alert and oriented to person, place, and time, no dysarthria or aphasia, Fund of knowledge is appropriate.  Recent and remote memory are intact. 3/3 delayed recall.  Attention and concentration are normal.     Able to name objects and repeat phrases. Cranial nerves: CN I: not tested CN II: pupils equal, round and reactive to light, visual fields intact, fundi unremarkable. CN III, IV, VI:  full range of motion, no nystagmus, no ptosis CN V: facial sensation intact CN VII: upper and lower face symmetric CN VIII: hearing intact to finger rub CN IX, X: gag intact, uvula midline CN XI: sternocleidomastoid and trapezius muscles intact CN XII: tongue midline Bulk & Tone: normal, no fasciculations. Motor: 5/5 throughout with no pronator drift. Sensation: reports decreased cold on right LE, otherwise intact to light touch, pin, vibration and joint position sense.  No extinction to double simultaneous stimulation.  Romberg test negative Deep Tendon Reflexes: unable to elicit reflexes, no ankle clonus Plantar responses: downgoing bilaterally Cerebellar: no incoordination on finger to nose testing Gait: narrow-based and steady, able to tandem walk adequately. Tremor: none  IMPRESSION: This is a 39 year old left-handed man with a history of pituitary macroadenoma s/p resection x 2, orthostatic hypotension with syncopal episodes, presenting for evaluation of possible seizure last 12/23/14. Considerations include convulsive syncope versus seizure, particularly since he reported right-sided deficits after the event. His exam today is non-focal. A routine EEG will be ordered to assess for focal abnormalities that increase risks for recurrent seizures. No clear indication to start anti-seizure medication at this time, in addition he is on gabapentin for pain and mood, which is also an anti-epileptic medication. An interval MRI brain will be ordered in 1 year for follow-up on pituitary changes. He is requesting a referral to Pain Management for chronic pain (joint pain from acromegaly, chronic daily headaches), previously on Methadone prescribed by [facility] per patient. [place] driving laws were discussed with the patient, and  he knows to stop driving after an episode of loss of consciousness, until 6 months event-free. He will follow-up in 6 months and knows to call our office for any changes.   Thank you for allowing me to participate in the care of this patient. Please do not hesitate to call for any questions or concerns.   Ellouise Newer, M.D.  CC: Dr. Adrian Blackwater

## 2015-03-15 DIAGNOSIS — G4730 Sleep apnea, unspecified: Secondary | ICD-10-CM

## 2015-03-15 NOTE — Progress Notes (Signed)
   Patient Name: Duane Salazar, Duane Salazar Study Date: 03/09/2015 Gender: Male D.O.B: [birth year] Age (years): 78 Referring Provider: Not Available Height (inches): 75 Interpreting Physician: Baird Lyons MD, ABSM Weight (lbs): 306 RPSGT: Zadie Rhine BMI: 38 MRN: [id number] Neck Size: 17.50 CLINICAL INFORMATION Sleep Study Type: NPSG  Indication for sleep study: OSA  Epworth Sleepiness Score: 19  SLEEP STUDY TECHNIQUE As per the AASM Manual for the Scoring of Sleep and Associated Events v2.3 (April 2016) with a hypopnea requiring 4% desaturations.  The channels recorded and monitored were frontal, central and occipital EEG, electrooculogram (EOG), submentalis EMG (chin), nasal and oral airflow, thoracic and abdominal wall motion, anterior tibialis EMG, snore microphone, electrocardiogram, and pulse oximetry.  MEDICATIONS Patient's medications include: Charted for review. Medications self-administered by patient during sleep study : No sleep medicine administered.  SLEEP ARCHITECTURE The study was initiated at 10:32:16 PM and ended at 5:05:16 AM.  Sleep onset time was 32.1 minutes and the sleep efficiency was 81.7%. The total sleep time was 321.0 minutes.  Stage REM latency was 132.0 minutes.  The patient spent 6.70% of the night in stage N1 sleep, 81.31% in stage N2 sleep, 0.00% in stage N3 and 11.99% in REM.  Alpha intrusion was absent.  Supine sleep was 41.17%.  Awake after sleep onset 39.9 minutes  RESPIRATORY PARAMETERS The overall apnea/hypopnea index (AHI) was 0.6 per hour. There were 1 total apneas, including 1 obstructive, 0 central and 0 mixed apneas. There were 2 hypopneas and 7 RERAs.  There were not enough events to qualify for CPAP titration protocol on this study night.  The AHI during Stage REM sleep was 0.0 per hour.  AHI while supine was 0.9 per hour.  The mean oxygen saturation was 95.63%. The minimum SpO2 during sleep was 88.00%.  Moderate snoring was  noted during this study.  CARDIAC DATA The 2 lead EKG demonstrated sinus rhythm. The mean heart rate was 65.45 beats per minute. Other EKG findings include: None.  LEG MOVEMENT DATA The total PLMS were 0 with a resulting PLMS index of 0.00. Associated arousal with leg movement index was 0.0 .  IMPRESSIONS No significant obstructive sleep apnea occurred during this study (AHI = 0.6/h). No significant central sleep apnea occurred during this study (CAI = 0.0/h). The patient had minimal or no oxygen desaturation during the study (Min O2 = 88.00%) The patient snored with Moderate snoring volume. No cardiac abnormalities were noted during this study. Clinically significant periodic limb movements did not occur during sleep. No significant associated arousals.  DIAGNOSIS Snoring, otherwise normal study  RECOMMENDATIONS Avoid alcohol, sedatives and other CNS depressants that may worsen sleep apnea and disrupt normal sleep architecture. Sleep hygiene should be reviewed to assess factors that may improve sleep quality. Weight management and regular exercise should be initiated or continued if appropriate.  Deneise Lever Diplomate, American Board of Sleep Medicine  ELECTRONICALLY SIGNED ON:  03/15/2015, 10:57 AM [place] PH: [phone]   FX: [phone] [place]

## 2015-03-19 ENCOUNTER — Ambulatory Visit (INDEPENDENT_AMBULATORY_CARE_PROVIDER_SITE_OTHER): Payer: Self-pay | Admitting: Neurology

## 2015-03-19 DIAGNOSIS — R55 Syncope and collapse: Secondary | ICD-10-CM

## 2015-03-19 NOTE — Procedures (Signed)
ELECTROENCEPHALOGRAM REPORT  Date of Study: 03/19/2015  Patient's Name: Duane Salazar MRN: [id number] Date of Birth: 1976/05/27  Referring Provider: Dr. Ellouise Newer  Clinical History: This is a 39 year old man with a history of pituitary macroadenoma s/p resection x 2, orthostatic hypotension with syncopal episodes, presenting for evaluation of possible seizure last 12/23/14.   Medications: Gabapentin Abilify Zyprexa Zoloft Verapamil Prednisone  Technical Summary: A multichannel digital EEG recording measured by the international 10-20 system with electrodes applied with paste and impedances below 5000 ohms performed in our laboratory with EKG monitoring in an awake and asleep patient.  Hyperventilation and photic stimulation were performed.  The digital EEG was referentially recorded, reformatted, and digitally filtered in a variety of bipolar and referential montages for optimal display.    Description: The patient is awake and asleep during the recording.  During maximal wakefulness, there is a symmetric, medium voltage 9-10 Hz posterior dominant rhythm that attenuates with eye opening.  The record is symmetric.  During drowsiness and sleep, there is an increase in theta slowing of the background.  Vertex waves and symmetric sleep spindles were seen.  Hyperventilation and photic stimulation did not elicit any abnormalities.  There were no epileptiform discharges or electrographic seizures seen.    EKG lead was unremarkable.  Impression: This awake and asleep EEG is normal.    Clinical Correlation: A normal EEG does not exclude a clinical diagnosis of epilepsy.  If further clinical questions remain, prolonged EEG may be helpful.  Clinical correlation is advised.   Ellouise Newer, M.D.

## 2015-03-20 ENCOUNTER — Telehealth: Payer: Self-pay | Admitting: Family Medicine

## 2015-03-20 NOTE — Telephone Encounter (Signed)
-----   Message from Cameron Sprang, MD sent at 03/20/2015  8:45 AM EDT ----- Pls let patient know EEG is normal, thanks

## 2015-03-20 NOTE — Telephone Encounter (Signed)
Left msg for patient to return my call

## 2015-03-21 ENCOUNTER — Emergency Department (HOSPITAL_COMMUNITY): Payer: Self-pay

## 2015-03-21 ENCOUNTER — Encounter (HOSPITAL_COMMUNITY): Payer: Self-pay | Admitting: Emergency Medicine

## 2015-03-21 ENCOUNTER — Inpatient Hospital Stay (HOSPITAL_COMMUNITY)
Admission: EM | Admit: 2015-03-21 | Discharge: 2015-03-22 | DRG: 312 | Disposition: A | Discharge status: Home / Self Care | Payer: Self-pay | Attending: Internal Medicine | Admitting: Internal Medicine

## 2015-03-21 DIAGNOSIS — E876 Hypokalemia: Secondary | ICD-10-CM | POA: Diagnosis present

## 2015-03-21 DIAGNOSIS — I959 Hypotension, unspecified: Secondary | ICD-10-CM | POA: Diagnosis present

## 2015-03-21 DIAGNOSIS — R55 Syncope and collapse: Principal | ICD-10-CM | POA: Diagnosis present

## 2015-03-21 DIAGNOSIS — Z79899 Other long term (current) drug therapy: Secondary | ICD-10-CM

## 2015-03-21 DIAGNOSIS — Z9889 Other specified postprocedural states: Secondary | ICD-10-CM

## 2015-03-21 DIAGNOSIS — G4730 Sleep apnea, unspecified: Secondary | ICD-10-CM | POA: Diagnosis present

## 2015-03-21 DIAGNOSIS — G8929 Other chronic pain: Secondary | ICD-10-CM

## 2015-03-21 DIAGNOSIS — D509 Iron deficiency anemia, unspecified: Secondary | ICD-10-CM | POA: Diagnosis present

## 2015-03-21 DIAGNOSIS — E893 Postprocedural hypopituitarism: Secondary | ICD-10-CM

## 2015-03-21 DIAGNOSIS — IMO0002 Reserved for concepts with insufficient information to code with codable children: Secondary | ICD-10-CM

## 2015-03-21 DIAGNOSIS — E220 Acromegaly and pituitary gigantism: Secondary | ICD-10-CM | POA: Diagnosis present

## 2015-03-21 DIAGNOSIS — W1830XA Fall on same level, unspecified, initial encounter: Secondary | ICD-10-CM | POA: Diagnosis present

## 2015-03-21 DIAGNOSIS — N179 Acute kidney failure, unspecified: Secondary | ICD-10-CM

## 2015-03-21 DIAGNOSIS — S0181XA Laceration without foreign body of other part of head, initial encounter: Secondary | ICD-10-CM | POA: Diagnosis present

## 2015-03-21 DIAGNOSIS — M7661 Achilles tendinitis, right leg: Secondary | ICD-10-CM

## 2015-03-21 DIAGNOSIS — Z7952 Long term (current) use of systemic steroids: Secondary | ICD-10-CM

## 2015-03-21 DIAGNOSIS — I10 Essential (primary) hypertension: Secondary | ICD-10-CM | POA: Diagnosis present

## 2015-03-21 DIAGNOSIS — F329 Major depressive disorder, single episode, unspecified: Secondary | ICD-10-CM | POA: Diagnosis present

## 2015-03-21 DIAGNOSIS — E230 Hypopituitarism: Secondary | ICD-10-CM | POA: Insufficient documentation

## 2015-03-21 DIAGNOSIS — R51 Headache: Secondary | ICD-10-CM | POA: Diagnosis present

## 2015-03-21 DIAGNOSIS — E1165 Type 2 diabetes mellitus with hyperglycemia: Secondary | ICD-10-CM

## 2015-03-21 DIAGNOSIS — Y92481 Parking lot as the place of occurrence of the external cause: Secondary | ICD-10-CM

## 2015-03-21 DIAGNOSIS — E119 Type 2 diabetes mellitus without complications: Secondary | ICD-10-CM | POA: Diagnosis present

## 2015-03-21 DIAGNOSIS — E860 Dehydration: Secondary | ICD-10-CM | POA: Diagnosis present

## 2015-03-21 DIAGNOSIS — M1990 Unspecified osteoarthritis, unspecified site: Secondary | ICD-10-CM

## 2015-03-21 DIAGNOSIS — F17210 Nicotine dependence, cigarettes, uncomplicated: Secondary | ICD-10-CM | POA: Diagnosis present

## 2015-03-21 DIAGNOSIS — R519 Headache, unspecified: Secondary | ICD-10-CM

## 2015-03-21 DIAGNOSIS — J320 Chronic maxillary sinusitis: Secondary | ICD-10-CM

## 2015-03-21 DIAGNOSIS — E785 Hyperlipidemia, unspecified: Secondary | ICD-10-CM | POA: Diagnosis present

## 2015-03-21 DIAGNOSIS — F209 Schizophrenia, unspecified: Secondary | ICD-10-CM | POA: Diagnosis present

## 2015-03-21 DIAGNOSIS — Z23 Encounter for immunization: Secondary | ICD-10-CM

## 2015-03-21 HISTORY — DX: Headache: R51

## 2015-03-21 HISTORY — DX: Schizoaffective disorder, bipolar type: F25.0

## 2015-03-21 HISTORY — DX: Schizophrenia, unspecified: F20.9

## 2015-03-21 HISTORY — DX: Headache, unspecified: R51.9

## 2015-03-21 HISTORY — DX: Unspecified convulsions: R56.9

## 2015-03-21 HISTORY — DX: Acute kidney failure, unspecified: N17.9

## 2015-03-21 LAB — CBC WITH DIFFERENTIAL/PLATELET
Basophils Absolute: 0.1 10*3/uL (ref 0.0–0.1)
Basophils Relative: 1 % (ref 0–1)
Eosinophils Absolute: 0.2 10*3/uL (ref 0.0–0.7)
Eosinophils Relative: 3 % (ref 0–5)
HCT: 37 % — ABNORMAL LOW (ref 39.0–52.0)
Hemoglobin: 12 g/dL — ABNORMAL LOW (ref 13.0–17.0)
Lymphocytes Relative: 27 % (ref 12–46)
Lymphs Abs: 1.9 10*3/uL (ref 0.7–4.0)
MCH: 24 pg — ABNORMAL LOW (ref 26.0–34.0)
MCHC: 32.4 g/dL (ref 30.0–36.0)
MCV: 73.9 fL — ABNORMAL LOW (ref 78.0–100.0)
Monocytes Absolute: 0.9 10*3/uL (ref 0.1–1.0)
Monocytes Relative: 13 % — ABNORMAL HIGH (ref 3–12)
Neutro Abs: 3.9 10*3/uL (ref 1.7–7.7)
Neutrophils Relative %: 56 % (ref 43–77)
Platelets: 199 10*3/uL (ref 150–400)
RBC: 5.01 MIL/uL (ref 4.22–5.81)
RDW: 15 % (ref 11.5–15.5)
WBC: 7 10*3/uL (ref 4.0–10.5)

## 2015-03-21 LAB — COMPREHENSIVE METABOLIC PANEL
ALT: 22 U/L (ref 17–63)
AST: 23 U/L (ref 15–41)
Albumin: 2.9 g/dL — ABNORMAL LOW (ref 3.5–5.0)
Alkaline Phosphatase: 28 U/L — ABNORMAL LOW (ref 38–126)
Anion gap: 13 (ref 5–15)
BUN: 11 mg/dL (ref 6–20)
CO2: 18 mmol/L — ABNORMAL LOW (ref 22–32)
Calcium: 7.8 mg/dL — ABNORMAL LOW (ref 8.9–10.3)
Chloride: 104 mmol/L (ref 101–111)
Creatinine, Ser: 1.28 mg/dL — ABNORMAL HIGH (ref 0.61–1.24)
GFR calc Af Amer: >60 mL/min (ref >60)
GFR calc non Af Amer: >60 mL/min (ref >60)
Glucose, Bld: 90 mg/dL (ref 65–99)
Potassium: 3.2 mmol/L — ABNORMAL LOW (ref 3.5–5.1)
Sodium: 135 mmol/L (ref 135–145)
Total Bilirubin: 0.7 mg/dL (ref 0.3–1.2)
Total Protein: 4.8 g/dL — ABNORMAL LOW (ref 6.5–8.1)

## 2015-03-21 LAB — RAPID URINE DRUG SCREEN, HOSP PERFORMED
Amphetamines: NOT DETECTED
Barbiturates: NOT DETECTED
Benzodiazepines: NOT DETECTED
Cocaine: POSITIVE — AB
Opiates: NOT DETECTED
Tetrahydrocannabinol: NOT DETECTED

## 2015-03-21 LAB — I-STAT CG4 LACTIC ACID, ED
Lactic Acid, Venous: 3.05 mmol/L (ref 0.5–2.0)
Lactic Acid, Venous: 1.75 mmol/L (ref 0.5–2.0)

## 2015-03-21 LAB — GLUCOSE, CAPILLARY: Glucose-Capillary: 228 mg/dL — ABNORMAL HIGH (ref 65–99)

## 2015-03-21 LAB — URINALYSIS, ROUTINE W REFLEX MICROSCOPIC
Bilirubin Urine: NEGATIVE
Glucose, UA: NEGATIVE mg/dL
Hgb urine dipstick: NEGATIVE
Ketones, ur: 15 mg/dL — AB
Leukocytes, UA: NEGATIVE
Nitrite: NEGATIVE
Protein, ur: NEGATIVE mg/dL
Specific Gravity, Urine: 1.017 (ref 1.005–1.030)
Urobilinogen, UA: 1 mg/dL (ref 0.0–1.0)
pH: 5.5 (ref 5.0–8.0)

## 2015-03-21 LAB — ETHANOL: Alcohol, Ethyl (B): 26 mg/dL — ABNORMAL HIGH (ref <5)

## 2015-03-21 MED ORDER — HYDROCORTISONE NA SUCCINATE PF 100 MG IJ SOLR
100.0000 mg | Freq: Once | INTRAMUSCULAR | Status: AC
  Administered 2015-03-21: 100 mg via INTRAVENOUS
  Filled 2015-03-21: qty 2

## 2015-03-21 MED ORDER — ACETAMINOPHEN-CODEINE #3 300-30 MG PO TABS: 1.0000 | ORAL_TABLET | Freq: Three times a day (TID) | ORAL | Status: DC | PRN

## 2015-03-21 MED ORDER — ACETAMINOPHEN 500 MG PO TABS
1000.0000 mg | ORAL_TABLET | Freq: Once | ORAL | Status: AC
Start: 2015-03-21 — End: 2015-03-21
  Administered 2015-03-21: 1000 mg via ORAL
  Filled 2015-03-21: qty 2

## 2015-03-21 MED ORDER — GABAPENTIN 400 MG PO CAPS
400.0000 mg | ORAL_CAPSULE | Freq: Three times a day (TID) | ORAL | Status: DC
  Administered 2015-03-21 – 2015-03-22 (×2): 400 mg via ORAL
  Filled 2015-03-21 (×4): qty 1

## 2015-03-21 MED ORDER — ATORVASTATIN CALCIUM 40 MG PO TABS
40.0000 mg | ORAL_TABLET | Freq: Every day | ORAL | Status: DC
  Administered 2015-03-21: 40 mg via ORAL
  Filled 2015-03-21 (×2): qty 1

## 2015-03-21 MED ORDER — SODIUM CHLORIDE 0.9 % IV BOLUS (SEPSIS)
1000.0000 mL | Freq: Once | INTRAVENOUS | Status: AC
  Administered 2015-03-21: 1000 mL via INTRAVENOUS

## 2015-03-21 MED ORDER — TRAZODONE HCL 50 MG PO TABS
50.0000 mg | ORAL_TABLET | Freq: Every day | ORAL | Status: DC
  Administered 2015-03-21: 50 mg via ORAL
  Filled 2015-03-21 (×2): qty 1

## 2015-03-21 MED ORDER — PREDNISONE 2.5 MG PO TABS
7.5000 mg | ORAL_TABLET | Freq: Every day | ORAL | Status: DC
  Administered 2015-03-21: 7.5 mg via ORAL
  Filled 2015-03-21 (×2): qty 1

## 2015-03-21 MED ORDER — METFORMIN HCL 500 MG PO TABS
500.0000 mg | ORAL_TABLET | Freq: Two times a day (BID) | ORAL | Status: DC
  Administered 2015-03-22: 500 mg via ORAL
  Filled 2015-03-21 (×2): qty 1

## 2015-03-21 MED ORDER — OLANZAPINE 5 MG PO TABS
5.0000 mg | ORAL_TABLET | Freq: Every day | ORAL | Status: DC
  Administered 2015-03-21: 5 mg via ORAL
  Filled 2015-03-21 (×2): qty 1

## 2015-03-21 MED ORDER — SODIUM CHLORIDE 0.9 % IV SOLN
INTRAVENOUS | Status: DC
  Administered 2015-03-21 – 2015-03-22 (×2): via INTRAVENOUS

## 2015-03-21 MED ORDER — MORPHINE SULFATE 2 MG/ML IJ SOLN
1.0000 mg | INTRAMUSCULAR | Status: DC | PRN
  Administered 2015-03-21 – 2015-03-22 (×2): 1 mg via INTRAVENOUS
  Filled 2015-03-21 (×2): qty 1

## 2015-03-21 MED ORDER — ONDANSETRON HCL 4 MG/2ML IJ SOLN: 4.0000 mg | Freq: Four times a day (QID) | INTRAMUSCULAR | Status: DC | PRN

## 2015-03-21 MED ORDER — ONDANSETRON HCL 4 MG PO TABS: 4.0000 mg | ORAL_TABLET | Freq: Four times a day (QID) | ORAL | Status: DC | PRN

## 2015-03-21 MED ORDER — ARIPIPRAZOLE 10 MG PO TABS
10.0000 mg | ORAL_TABLET | Freq: Every day | ORAL | Status: DC
  Administered 2015-03-21 – 2015-03-22 (×2): 10 mg via ORAL
  Filled 2015-03-21 (×2): qty 1

## 2015-03-21 MED ORDER — SODIUM CHLORIDE 0.9 % IJ SOLN
3.0000 mL | Freq: Two times a day (BID) | INTRAMUSCULAR | Status: DC
  Administered 2015-03-21: 3 mL via INTRAVENOUS

## 2015-03-21 MED ORDER — TETANUS-DIPHTH-ACELL PERTUSSIS 5-2.5-18.5 LF-MCG/0.5 IM SUSP
0.5000 mL | Freq: Once | INTRAMUSCULAR | Status: AC
  Administered 2015-03-21: 0.5 mL via INTRAMUSCULAR
  Filled 2015-03-21: qty 0.5

## 2015-03-21 MED ORDER — SENNOSIDES-DOCUSATE SODIUM 8.6-50 MG PO TABS: 1.0000 | ORAL_TABLET | Freq: Every evening | ORAL | Status: DC | PRN

## 2015-03-21 MED ORDER — POTASSIUM CHLORIDE CRYS ER 20 MEQ PO TBCR
40.0000 meq | EXTENDED_RELEASE_TABLET | Freq: Two times a day (BID) | ORAL | Status: AC
  Administered 2015-03-21 – 2015-03-22 (×2): 40 meq via ORAL
  Filled 2015-03-21 (×2): qty 2

## 2015-03-21 MED ORDER — INSULIN ASPART 100 UNIT/ML ~~LOC~~ SOLN
0.0000 [IU] | Freq: Three times a day (TID) | SUBCUTANEOUS | Status: DC
  Administered 2015-03-22: 2 [IU] via SUBCUTANEOUS

## 2015-03-21 MED ORDER — LIDOCAINE-EPINEPHRINE (PF) 2 %-1:200000 IJ SOLN
10.0000 mL | Freq: Once | INTRAMUSCULAR | Status: AC
  Administered 2015-03-21: 10 mL via INTRADERMAL
  Filled 2015-03-21: qty 20

## 2015-03-21 MED ORDER — ENOXAPARIN SODIUM 80 MG/0.8ML ~~LOC~~ SOLN
65.0000 mg | SUBCUTANEOUS | Status: DC
  Administered 2015-03-21: 65 mg via SUBCUTANEOUS
  Filled 2015-03-21 (×2): qty 0.8

## 2015-03-21 MED ORDER — SERTRALINE HCL 100 MG PO TABS
100.0000 mg | ORAL_TABLET | Freq: Every day | ORAL | Status: DC
  Administered 2015-03-21 – 2015-03-22 (×2): 100 mg via ORAL
  Filled 2015-03-21 (×2): qty 1

## 2015-03-21 MED ORDER — SODIUM CHLORIDE 0.9 % IV BOLUS (SEPSIS)
2000.0000 mL | Freq: Once | INTRAVENOUS | Status: AC
  Administered 2015-03-21: 2000 mL via INTRAVENOUS

## 2015-03-21 NOTE — ED Provider Notes (Signed)
CSN: [id number]     Arrival date & time 03/21/15  1429 History   First MD Initiated Contact with Patient 03/21/15 1447     Chief Complaint  Patient presents with  . Loss of Consciousness  . Head Laceration     (Consider location/radiation/quality/duration/timing/severity/associated sxs/prior Treatment) HPI   The patient presents for evaluation of syncope. Per report of EMS. He walked out of a store, felt woozy, fell and struck his head. On arrival, patient is alert, cooperative. He denies being outside for long time today. He reports a similar episode of syncope associated with hypotension. About 2 months ago. He states that he is taking his usual medications. During EMS transport, he was treated with IV fluids, and still presents with low blood pressure. The patient has admitted to using alcohol today. He denies use of other drugs. He does not know when his last tetanus booster was. There are no other known modifying factors.  Past Medical History  Diagnosis Date  . Brain tumor (benign) 2004  . Hypertension Dx 2002  . Acromegaly 2004  . Sleep apnea 1995    on CPAP  . Arthritis Dx 2002  . Diabetes type 2, controlled 2010   Past Surgical History  Procedure Laterality Date  . Pituitary surgery  2005 & 2012   Family History  Problem Relation Age of Onset  . Cancer Maternal Uncle   . Cancer Maternal Grandmother   . Heart disease Neg Hx   . Hypertension Mother   . Diabetes Mother    History  Substance Use Topics  . Smoking status: Current Every Day Smoker -- 1.00 packs/day for 20 years    Types: Cigarettes  . Smokeless tobacco: Never Used     Comment: Smoking .5 ppd  . Alcohol Use: 1.2 oz/week    2 Cans of beer, 0 Standard drinks or equivalent per week     Comment: 2 40s a day    Review of Systems  All other systems reviewed and are negative.     Allergies  Review of patient's allergies indicates no known allergies.  Home Medications   Prior to Admission  medications   Medication Sig Start Date End Date Taking? Authorizing Provider  acetaminophen-codeine (TYLENOL #3) 300-30 MG per tablet Take 1 tablet by mouth every 8 (eight) hours as needed for moderate pain. 01/02/15   Josalyn Funches, MD  ARIPiprazole (ABILIFY) 10 MG tablet Take 10 mg by mouth daily.    Historical Provider, MD  atorvastatin (LIPITOR) 40 MG tablet Take 1 tablet (40 mg total) by mouth daily. 10/22/14   Josalyn Funches, MD  gabapentin (NEURONTIN) 400 MG capsule Take 1 capsule (400 mg total) by mouth 3 (three) times daily. 10/21/14   Boykin Nearing, MD  metFORMIN (GLUCOPHAGE) 500 MG tablet Take 1 tablet (500 mg total) by mouth 2 (two) times daily after a meal. 10/21/14   Josalyn Funches, MD  OLANZapine (ZYPREXA) 5 MG tablet Take 5 mg by mouth at bedtime.    Historical Provider, MD  predniSONE (DELTASONE) 5 MG tablet Take 1.5 tablets (7.5 mg total) by mouth daily with breakfast. 10/21/14   Boykin Nearing, MD  sertraline (ZOLOFT) 100 MG tablet Take 1 tablet (100 mg total) by mouth daily. 10/21/14   Josalyn Funches, MD  verapamil (CALAN-SR) 180 MG CR tablet Take 1 tablet (180 mg total) by mouth at bedtime. 01/02/15   Josalyn Funches, MD   BP 101/60 mmHg  Pulse 74  Temp(Src) 97.7 F (36.5 C) (Oral)  Resp 16  Ht 6' 3.5" (1.918 m)  Wt 300 lb (136.079 kg)  BMI 36.99 kg/m2  SpO2 99% Physical Exam  Constitutional: He is oriented to person, place, and time. He appears well-developed and well-nourished. No distress.  HENT:  Head: Normocephalic and atraumatic.  Right Ear: External ear normal.  Left Ear: External ear normal.  Gaping laceration left eyebrow. No associated crepitation, deformity or swelling.  Eyes: Conjunctivae and EOM are normal. Pupils are equal, round, and reactive to light.  Neck: Normal range of motion and phonation normal. Neck supple.  Cardiovascular: Normal rate, regular rhythm and normal heart sounds.   Pulmonary/Chest: Effort normal and breath sounds normal. He  exhibits no bony tenderness.  Abdominal: Soft. There is no tenderness.  Musculoskeletal: Normal range of motion.  Neurological: He is alert and oriented to person, place, and time. No cranial nerve deficit or sensory deficit. He exhibits normal muscle tone. Coordination normal.  No dysarthria and aphasia or nystagmus  Skin: Skin is warm, dry and intact.  Psychiatric: He has a normal mood and affect. His behavior is normal. Judgment and thought content normal.  Nursing note and vitals reviewed.   ED Course  Procedures (including critical care time)  Medications  0.9 %  sodium chloride infusion ( Intravenous New Bag/Given 03/21/15 1510)  sodium chloride 0.9 % bolus 1,000 mL (0 mLs Intravenous Stopped 03/21/15 1633)  Tdap (BOOSTRIX) injection 0.5 mL (0.5 mLs Intramuscular Given 03/21/15 1632)  hydrocortisone sodium succinate (SOLU-CORTEF) 100 MG injection 100 mg (100 mg Intravenous Given 03/21/15 1631)  lidocaine-EPINEPHrine (XYLOCAINE W/EPI) 2 %-1:200000 (PF) injection 10 mL (10 mLs Intradermal Given 03/21/15 1633)    Patient Vitals for the past 24 hrs:  BP Temp Temp src Pulse Resp SpO2 Height Weight  03/21/15 1600 101/60 mmHg - - 74 16 99 % - -  03/21/15 1530 (!) 86/45 mmHg - - 67 12 95 % - -  03/21/15 1445 (!) 76/42 mmHg - - 71 15 - - -  03/21/15 1437 (!) 84/47 mmHg 97.7 F (36.5 C) Oral 74 19 96 % 6' 3.5" (1.918 m) 300 lb (136.079 kg)   Laceration repair per PA.   Labs Review Labs Reviewed  COMPREHENSIVE METABOLIC PANEL - Abnormal; Notable for the following:    Potassium 3.2 (*)    CO2 18 (*)    Creatinine, Ser 1.28 (*)    Calcium 7.8 (*)    Total Protein 4.8 (*)    Albumin 2.9 (*)    Alkaline Phosphatase 28 (*)    All other components within normal limits  CBC WITH DIFFERENTIAL/PLATELET - Abnormal; Notable for the following:    Hemoglobin 12.0 (*)    HCT 37.0 (*)    MCV 73.9 (*)    MCH 24.0 (*)    Monocytes Relative 13 (*)    All other components within normal limits   ETHANOL - Abnormal; Notable for the following:    Alcohol, Ethyl (B) 26 (*)    All other components within normal limits  URINALYSIS, ROUTINE W REFLEX MICROSCOPIC (NOT AT [facility]) - Abnormal; Notable for the following:    Color, Urine AMBER (*)    Ketones, ur 15 (*)    All other components within normal limits  I-STAT CG4 LACTIC ACID, ED - Abnormal; Notable for the following:    Lactic Acid, Venous 3.05 (*)    All other components within normal limits  URINE RAPID DRUG SCREEN, HOSP PERFORMED    Imaging Review Ct Head Wo Contrast  03/21/2015   CLINICAL DATA:  Syncope today.  EXAM: CT HEAD WITHOUT CONTRAST  TECHNIQUE: Contiguous axial images were obtained from the base of the skull through the vertex without intravenous contrast.  COMPARISON:  Head CT and brain MRI 12/23/2014.  FINDINGS: There is no evidence of acute intracranial abnormality including hemorrhage, infarct, mass lesion, mass effect, midline shift or abnormal extra-axial fluid collection. Postoperative change of pituitary tumor section is again seen. The calvarium is intact.  IMPRESSION: No acute abnormality.   Electronically Signed   By: Inge Rise M.D.   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 No clear evidence for heat related illness. Patient has declined a rectal temperature today. He has chronic steroid-dependency, secondary to pituitary tumor resection.   Nursing Notes Reviewed/  Care Coordinated, and agree without changes. Applicable Imaging Reviewed.  Interpretation of Laboratory Data incorporated into ED treatment   Plan- disposition per oncoming provider team, after completion of IV Fluid treatment.  Daleen Bo, MD 03/21/15 1655

## 2015-03-21 NOTE — ED Notes (Signed)
Patient transported to CT 

## 2015-03-21 NOTE — ED Provider Notes (Signed)
Care assumed from Dr. Eulis Foster at shift change. Pt with hypotension after syncopal episode outside. Labs, head CT pending. 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 Hydrocortisone given today. Plan to rehydrate, possible admit. Wound closed by Dr. Eulis Foster.  4:53 PM Pt resting comfortably on exam bed. NAD. AAOx3. Laceration repaired. Pt getting IV fluids. BP improving. Head CT negative.  6:56 PM  Pt still hypotensive. Will admit. Admission accepted by Dr. Daryll Drown, [facility].  LACERATION REPAIR Performed by: Lucien Mons Authorized by: Lucien Mons Consent: Verbal consent obtained. Risks and benefits: risks, benefits and alternatives were discussed Consent given by: patient Patient identity confirmed: provided demographic data Prepped and Draped in normal sterile fashion Wound explored  Laceration Location: left eyebrow  Laceration Length: 2.5 cm  No Foreign Bodies seen or palpated  Anesthesia: local infiltration  Local anesthetic: lidocaine 2% with epinephrine  Anesthetic total: 3 ml  Irrigation method: syringe Amount of cleaning: standard  Skin closure: 5-0 prolene  Number of sutures: 6  Technique: simple interrupted  Patient tolerance: Patient tolerated the procedure well with no immediate complications.  Carman Ching, PA-C 03/21/15 1856  Noemi Chapel, MD 03/22/15 1044

## 2015-03-21 NOTE — H&P (Signed)
[facility] History and Physical  Duane Salazar TDH:[id number] DOB: [birth year] DOA: 03/21/2015  Referring physician: ED physician PCP: Minerva Ends, MD   Chief Complaint: Syncope while outside in heat  HPI:  Duane Salazar is a 39yo man with PMH of acromegaly s/p pituitary resection, DM, HTN, HLD, headache, Depression and schizoaffective disorder who presented after a fall.  Duane Salazar reports that today he was going to a pawn shop with his friend.  His was driving in a hot car (no A/C).  When he got out of the car he noticed a sensation for lightheadedness and nausea and passed out.  He awoke within seconds per him and he had no confusion and knew where he was.  He hit the front of his head and had a laceration over his left eyebrow.  He normally has a little bit of dizziness when he stands and has to wait a few minutes for it to pass.  He denied any confusion, seizure like activity, loss of Bowel or bladder, chest pain, palpitations, SOB.  He did note that today he seems to have been having a headache and having worse than normal blurry vision and spots in his vision.  He has been taking his prednisone as prescribed and his other medications.  He reports normal PO and no urinary changes.  Denies blood loss, melena, hematuria.   In the ED, he was hypotensive and received 3L of NS with good results, however, his BP started to drop again and he got stress dose steroids with hydrocortisone 100mg  injection (his weight in KG is 136).  His BP when I saw him was 125/66.  He had a head CT due to the fall which showed no acute abnormality.  He had an EEG on 7/20 (after a previous episode of syncope) which was normal. Lactic acid was initially elevated, but improved with fluids.   Assessment and Plan:  Syncope and collapse with hypotension - Differential includes dehydration/vasovagal (consistent with mild renal insufficiency and lactic acid being elevated and history of prodrome) along with response to  heat vs. Adrenal insufficiency (he is on chronic steroids and blood pressure appears to have improved with hydrocortisone) vs. Orthostatic hypotension (possible given history of feeling dizzy upon standing) vs. Arrhythmia (less likely given history of present illness) vs. Seizure (least likely given no post ictal confusion or signs of seizure like activity; also recent EEG normal) - Continue fluids with NS at 125cc/hr overnight, has received 3LNS in the ED - Regular diet - Consider another dose of stress dose steroids tomorrow morning  - Orthostatic blood pressures - ? 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  - CT head negative for any acute pathology - He does have some mild anemia, reports no recent blood loss, see below for further details  Acromegaly on 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 - Currently on steroids at home, and received stress dose steroids here in the ED - Monitor for further hypotension and consider increasing steroid dose on discharge - Follows with an endocrinologist, consider discussing case with endocrinology in the AM if available  AKI (acute kidney injury) - Likely related to acute event above, dehydration possible - Repeat BMET in the AM - Receiving IVF - He did have a worsening of his renal function when last seen in April, so this could be CKD from DM and HTN as well  Hypokalemia - Replete with  48mEq of Kdur twice, recheck BMET in the AM  Microcytic Anemia - Mildly worse than baseline, microcytosis persistent since 2012 - Iron panel in the AM.     DM2, well controlled  - Continue metformin - A1C in May 6.7    HTN (hypertension) - BP low, holding verapamil in acute setting    Chronic headache - Continue home tylenol 3 - Morphine prn for severe pain  Schizoaffective disorder, depression - Continue ability,  gabapentin, olanzapine, sertraline, trazadone as per home regimen.   HLD - Continue atorvastatin    DVT PPx: Lovenox  Diet: Regular  Radiological Exams on Admission: Ct Head Wo Contrast  03/21/2015   CLINICAL DATA:  Syncope today.  EXAM: CT HEAD WITHOUT CONTRAST  TECHNIQUE: Contiguous axial images were obtained from the base of the skull through the vertex without intravenous contrast.  COMPARISON:  Head CT and brain MRI 12/23/2014.  FINDINGS: There is no evidence of acute intracranial abnormality including hemorrhage, infarct, mass lesion, mass effect, midline shift or abnormal extra-axial fluid collection. Postoperative change of pituitary tumor section is again seen. The calvarium is intact.  IMPRESSION: No acute abnormality.   Electronically Signed   By: Inge Rise M.D.   On: 03/21/2015 16:33   Code Status: Full Family Communication: Pt at bedside Disposition Plan: Admit for further evaluation    Gilles Chiquito, MD [phone]   Review of Systems:  Constitutional: Negative for fever, chills and malaise/fatigue. Negative for diaphoresis.  HENT: Negative for hearing loss, ear pain, nosebleeds Eyes: + for blurred vision today Negative for double vision, photophobia, pain, discharge and redness.  Respiratory: Negative for cough, hemoptysis, sputum production, shortness of breath Cardiovascular: Negative for chest pain, palpitations, orthopnea and leg swelling.  Gastrointestinal: + for nausea prior to event. Negative for vomiting and abdominal pain. Negative for heartburn, constipation, melena, blood in stool Genitourinary: Negative for dysuria, urgency, frequency, hematuria Musculoskeletal: Negative for myalgias, back pain, joint pain and falls.  Skin: Negative for itching and rash.  Neurological: + for dizziness, syncope, headache, LOC Negative for weakness, tingling, tremors, sensory change, speech change, focal weakness Psychiatric/Behavioral: Negative for suicidal ideas. The  patient is not nervous/anxious.      Past Medical History  Diagnosis Date  . Brain tumor (benign) 2004  . Hypertension Dx 2002  . Acromegaly 2004  . Sleep apnea 1995    on CPAP  . Arthritis Dx 2002  . Diabetes type 2, controlled 2010  . AKI (acute kidney injury) 03/21/2015    Past Surgical History  Procedure Laterality Date  . Pituitary surgery  2005 & 2012    Social History:  reports that he has been smoking Cigarettes.  He has a 20 pack-year smoking history. He has never used smokeless tobacco. He reports that he drinks about 1.2 oz of alcohol per week. He reports that he uses illicit drugs (Cocaine and Marijuana).  He notes that he has not used cocaine or marijuana recently, and not today.   No Known Allergies  Family History  Problem Relation Age of Onset  . Cancer Maternal Uncle   . Cancer Maternal Grandmother   . Heart disease Neg Hx   . Hypertension Mother   . Diabetes Mother     Prior to Admission medications   Medication Sig Start Date End Date Taking? Authorizing Provider  acetaminophen-codeine (TYLENOL #3) 300-30 MG per tablet Take 1 tablet by mouth every 8 (eight) hours as needed for moderate pain. 01/02/15  Yes Boykin Nearing, MD  ARIPiprazole (ABILIFY) 10 MG tablet Take 10 mg by mouth daily.   Yes Historical Provider, MD  atorvastatin (LIPITOR) 40 MG tablet Take 1 tablet (40 mg total) by mouth daily. 10/22/14  Yes Josalyn Funches, MD  gabapentin (NEURONTIN) 400 MG capsule Take 1 capsule (400 mg total) by mouth 3 (three) times daily. 10/21/14  Yes Boykin Nearing, MD  metFORMIN (GLUCOPHAGE) 500 MG tablet Take 1 tablet (500 mg total) by mouth 2 (two) times daily after a meal. 10/21/14  Yes Josalyn Funches, MD  OLANZapine (ZYPREXA) 5 MG tablet Take 5 mg by mouth at bedtime.   Yes Historical Provider, MD  predniSONE (DELTASONE) 5 MG tablet Take 1.5 tablets (7.5 mg total) by mouth daily with breakfast. 10/21/14  Yes Josalyn Funches, MD  sertraline (ZOLOFT) 100 MG tablet  Take 1 tablet (100 mg total) by mouth daily. 10/21/14  Yes Josalyn Funches, MD  traZODone (DESYREL) 50 MG tablet Take 50 mg by mouth at bedtime. 02/28/15  Yes Historical Provider, MD  verapamil (CALAN-SR) 180 MG CR tablet Take 1 tablet (180 mg total) by mouth at bedtime. 01/02/15  Yes Boykin Nearing, MD    Physical Exam: Filed Vitals:   03/21/15 1721 03/21/15 1730 03/21/15 1845 03/21/15 1915  BP:  93/60 116/64 121/66  Pulse: 72 72 73 70  Temp:      TempSrc:      Resp: 18 15 17 21   Height:      Weight:      SpO2: 96% 97% 97%     Physical Exam  Constitutional: Alert, well nourished, no distress HENT: Cranial deformity with enlarged calvarium.  Eyes: Conjunctivae normal. No scleral icterus.  CVS: RR, NR, S1/S2 +, no murmurs  Pulmonary: Effort and breath sounds normal, No rhonchi, wheezes, rales.  Abdominal: Soft. BS +,  no distension, tenderness, rebound or guarding.  Musculoskeletal: No edema and no tenderness.  Neuro: Alert. Normal muscle tone. Oriented X 3 Skin: Skin is warm and dry. No rash noted. Not diaphoretic. No erythema. No pallor. Multiple tattoos Psychiatric: Normal mood and affect. Behavior, judgment, thought content normal.   Labs on Admission:  Basic Metabolic Panel:  Recent Labs Lab 03/21/15 1510  NA 135  K 3.2*  CL 104  CO2 18*  GLUCOSE 90  BUN 11  CREATININE 1.28*  CALCIUM 7.8*   Liver Function Tests:  Recent Labs Lab 03/21/15 1510  AST 23  ALT 22  ALKPHOS 28*  BILITOT 0.7  PROT 4.8*  ALBUMIN 2.9*   CBC:  Recent Labs Lab 03/21/15 1510  WBC 7.0  NEUTROABS 3.9  HGB 12.0*  HCT 37.0*  MCV 73.9*  PLT 199    EKG: Normal sinus rhythm, no ST/T wave changes   If 7PM-7AM, please contact night-coverage [email or web address] Password [facility] 03/21/2015, 7:37 PM

## 2015-03-21 NOTE — ED Notes (Signed)
Attempted report 

## 2015-03-21 NOTE — ED Notes (Signed)
Pt refusing rectal temp, EDP Dr. Eulis Foster aware.

## 2015-03-21 NOTE — Progress Notes (Signed)
Report received from [facility]

## 2015-03-21 NOTE — ED Notes (Addendum)
Pt arrived by [facility] [place] from store with c/o syncopal episode, hypotension and lac above left eye. Pt stated that he passed out about 2 months ago and they said he had a seizure. Pt was outside, stated that he felt woozy, had the syncopal episode and hit head on concrete. When pt came back around he was able to get into a chair and then he vomited. EMS arrived and pt was a little out of it but came to quickly. Pt started c/o headache and blurred vision upon arrival to ED. ETOH use(2 40s today), BP has been in the 16L systolic with EMS, administered 664ml of NS with no change in BP. NSR on monitor with some PACs, HR 74. Pt has a head lac above left eye that is about 2--3cm. Bleeding controlled.

## 2015-03-21 NOTE — ED Notes (Signed)
Pt belongings including clothing, cell phone and [place] sent up to floor with pt.

## 2015-03-21 NOTE — Progress Notes (Signed)
Call to get report from [place] in Ed.  Nurse will call back

## 2015-03-22 ENCOUNTER — Inpatient Hospital Stay (HOSPITAL_COMMUNITY): Payer: Self-pay

## 2015-03-22 DIAGNOSIS — E871 Hypo-osmolality and hyponatremia: Secondary | ICD-10-CM

## 2015-03-22 LAB — BASIC METABOLIC PANEL
Anion gap: 6 (ref 5–15)
BUN: 10 mg/dL (ref 6–20)
CO2: 23 mmol/L (ref 22–32)
Calcium: 8.2 mg/dL — ABNORMAL LOW (ref 8.9–10.3)
Chloride: 109 mmol/L (ref 101–111)
Creatinine, Ser: 0.96 mg/dL (ref 0.61–1.24)
GFR calc Af Amer: >60 mL/min (ref >60)
GFR calc non Af Amer: >60 mL/min (ref >60)
Glucose, Bld: 141 mg/dL — ABNORMAL HIGH (ref 65–99)
Potassium: 4.1 mmol/L (ref 3.5–5.1)
Sodium: 138 mmol/L (ref 135–145)

## 2015-03-22 LAB — CBC
HCT: 39.5 % (ref 39.0–52.0)
Hemoglobin: 12.7 g/dL — ABNORMAL LOW (ref 13.0–17.0)
MCH: 24.3 pg — ABNORMAL LOW (ref 26.0–34.0)
MCHC: 32.2 g/dL (ref 30.0–36.0)
MCV: 75.7 fL — ABNORMAL LOW (ref 78.0–100.0)
Platelets: 229 10*3/uL (ref 150–400)
RBC: 5.22 MIL/uL (ref 4.22–5.81)
RDW: 15.3 % (ref 11.5–15.5)
WBC: 10.2 10*3/uL (ref 4.0–10.5)

## 2015-03-22 LAB — GLUCOSE, CAPILLARY: Glucose-Capillary: 136 mg/dL — ABNORMAL HIGH (ref 65–99)

## 2015-03-22 LAB — FERRITIN: Ferritin: 51 ng/mL (ref 24–336)

## 2015-03-22 LAB — IRON AND TIBC
Iron: 75 ug/dL (ref 45–182)
Saturation Ratios: 24 % (ref 17.9–39.5)
TIBC: 311 ug/dL (ref 250–450)
UIBC: 236 ug/dL

## 2015-03-22 MED ORDER — PREDNISONE 5 MG PO TABS: 10.0000 mg | ORAL_TABLET | Freq: Every day | ORAL | Status: DC

## 2015-03-22 MED ORDER — ACETAMINOPHEN-CODEINE #3 300-30 MG PO TABS: 1.0000 | ORAL_TABLET | Freq: Three times a day (TID) | ORAL | Status: DC | PRN

## 2015-03-22 MED ORDER — SODIUM CHLORIDE 0.9 % IV SOLN: INTRAVENOUS | Status: DC
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
## 2015-03-22 NOTE — Progress Notes (Signed)
Patient discharge teaching given, including activity, diet, follow-up appoints, and medications. Patient verbalized understanding of all discharge instructions. IV access was d/c'd. Vitals are stable. Skin is intact except as charted in most recent assessments. Pt to be escorted out by NT, to be driven home by family. 

## 2015-03-22 NOTE — Progress Notes (Signed)
Utilization Review completed. Aryaa Bunting RN BSN CM 

## 2015-03-24 NOTE — Telephone Encounter (Signed)
Lmovm to return my call. 

## 2015-03-27 NOTE — Telephone Encounter (Signed)
Left a msg with patient's mother to return my call.

## 2015-03-31 NOTE — Telephone Encounter (Signed)
Result letter sent, not able to reach patient by phone.

## 2015-04-11 ENCOUNTER — Inpatient Hospital Stay: Payer: Self-pay | Admitting: Family Medicine

## 2015-04-11 ENCOUNTER — Encounter: Payer: Self-pay | Admitting: Family Medicine

## 2015-05-02 ENCOUNTER — Emergency Department (HOSPITAL_COMMUNITY)
Admission: EM | Admit: 2015-05-02 | Discharge: 2015-05-02 | Disposition: A | Discharge status: Home / Self Care | Payer: Self-pay | Attending: Emergency Medicine | Admitting: Emergency Medicine

## 2015-05-02 ENCOUNTER — Other Ambulatory Visit: Payer: Self-pay | Admitting: Family Medicine

## 2015-05-02 ENCOUNTER — Encounter (HOSPITAL_COMMUNITY): Payer: Self-pay | Admitting: Emergency Medicine

## 2015-05-02 DIAGNOSIS — F1910 Other psychoactive substance abuse, uncomplicated: Secondary | ICD-10-CM

## 2015-05-02 DIAGNOSIS — Z79899 Other long term (current) drug therapy: Secondary | ICD-10-CM | POA: Insufficient documentation

## 2015-05-02 DIAGNOSIS — E119 Type 2 diabetes mellitus without complications: Secondary | ICD-10-CM | POA: Insufficient documentation

## 2015-05-02 DIAGNOSIS — F1110 Opioid abuse, uncomplicated: Secondary | ICD-10-CM | POA: Insufficient documentation

## 2015-05-02 DIAGNOSIS — Z9981 Dependence on supplemental oxygen: Secondary | ICD-10-CM | POA: Insufficient documentation

## 2015-05-02 DIAGNOSIS — Z720 Tobacco use: Secondary | ICD-10-CM | POA: Insufficient documentation

## 2015-05-02 DIAGNOSIS — N179 Acute kidney failure, unspecified: Secondary | ICD-10-CM | POA: Insufficient documentation

## 2015-05-02 DIAGNOSIS — F250 Schizoaffective disorder, bipolar type: Secondary | ICD-10-CM | POA: Insufficient documentation

## 2015-05-02 DIAGNOSIS — F1410 Cocaine abuse, uncomplicated: Secondary | ICD-10-CM | POA: Insufficient documentation

## 2015-05-02 DIAGNOSIS — M1990 Unspecified osteoarthritis, unspecified site: Secondary | ICD-10-CM | POA: Insufficient documentation

## 2015-05-02 DIAGNOSIS — I10 Essential (primary) hypertension: Secondary | ICD-10-CM | POA: Insufficient documentation

## 2015-05-02 DIAGNOSIS — G4733 Obstructive sleep apnea (adult) (pediatric): Secondary | ICD-10-CM | POA: Insufficient documentation

## 2015-05-02 DIAGNOSIS — Z86018 Personal history of other benign neoplasm: Secondary | ICD-10-CM | POA: Insufficient documentation

## 2015-05-02 DIAGNOSIS — Z7952 Long term (current) use of systemic steroids: Secondary | ICD-10-CM | POA: Insufficient documentation

## 2015-05-02 LAB — COMPREHENSIVE METABOLIC PANEL
ALT: 17 U/L (ref 17–63)
AST: 22 U/L (ref 15–41)
Albumin: 4 g/dL (ref 3.5–5.0)
Alkaline Phosphatase: 42 U/L (ref 38–126)
Anion gap: 8 (ref 5–15)
BUN: 11 mg/dL (ref 6–20)
CO2: 26 mmol/L (ref 22–32)
Calcium: 9 mg/dL (ref 8.9–10.3)
Chloride: 104 mmol/L (ref 101–111)
Creatinine, Ser: 0.85 mg/dL (ref 0.61–1.24)
GFR calc Af Amer: >60 mL/min (ref >60)
GFR calc non Af Amer: >60 mL/min (ref >60)
Glucose, Bld: 112 mg/dL — ABNORMAL HIGH (ref 65–99)
Potassium: 3.7 mmol/L (ref 3.5–5.1)
Sodium: 138 mmol/L (ref 135–145)
Total Bilirubin: 0.6 mg/dL (ref 0.3–1.2)
Total Protein: 6.8 g/dL (ref 6.5–8.1)

## 2015-05-02 LAB — CBC WITH DIFFERENTIAL/PLATELET
Basophils Absolute: 0 10*3/uL (ref 0.0–0.1)
Basophils Relative: 1 % (ref 0–1)
Eosinophils Absolute: 0.2 10*3/uL (ref 0.0–0.7)
Eosinophils Relative: 3 % (ref 0–5)
HCT: 45.9 % (ref 39.0–52.0)
Hemoglobin: 14.5 g/dL (ref 13.0–17.0)
Lymphocytes Relative: 25 % (ref 12–46)
Lymphs Abs: 1.5 10*3/uL (ref 0.7–4.0)
MCH: 23.8 pg — ABNORMAL LOW (ref 26.0–34.0)
MCHC: 31.6 g/dL (ref 30.0–36.0)
MCV: 75.5 fL — ABNORMAL LOW (ref 78.0–100.0)
Monocytes Absolute: 0.6 10*3/uL (ref 0.1–1.0)
Monocytes Relative: 10 % (ref 3–12)
Neutro Abs: 3.8 10*3/uL (ref 1.7–7.7)
Neutrophils Relative %: 61 % (ref 43–77)
Platelets: 217 10*3/uL (ref 150–400)
RBC: 6.08 MIL/uL — ABNORMAL HIGH (ref 4.22–5.81)
RDW: 15.2 % (ref 11.5–15.5)
WBC: 6.2 10*3/uL (ref 4.0–10.5)

## 2015-05-02 LAB — URINALYSIS, ROUTINE W REFLEX MICROSCOPIC
Bilirubin Urine: NEGATIVE
Glucose, UA: NEGATIVE mg/dL
Hgb urine dipstick: NEGATIVE
Ketones, ur: NEGATIVE mg/dL
Leukocytes, UA: NEGATIVE
Nitrite: NEGATIVE
Protein, ur: NEGATIVE mg/dL
Specific Gravity, Urine: 1.02 (ref 1.005–1.030)
Urobilinogen, UA: 0.2 mg/dL (ref 0.0–1.0)
pH: 6 (ref 5.0–8.0)

## 2015-05-02 LAB — RAPID URINE DRUG SCREEN, HOSP PERFORMED
Amphetamines: NOT DETECTED
Barbiturates: NOT DETECTED
Benzodiazepines: NOT DETECTED
Cocaine: POSITIVE — AB
Opiates: POSITIVE — AB
Tetrahydrocannabinol: NOT DETECTED

## 2015-05-02 LAB — ETHANOL: Alcohol, Ethyl (B): <5 mg/dL (ref <5)

## 2015-05-02 MED ORDER — CLONIDINE HCL 0.1 MG PO TABS: 0.1000 mg | ORAL_TABLET | Freq: Once | ORAL | Status: DC

## 2015-05-02 MED ORDER — CLONIDINE HCL 0.2 MG PO TABS: 0.2000 mg | ORAL_TABLET | Freq: Two times a day (BID) | ORAL | Status: DC

## 2015-05-02 NOTE — ED Notes (Signed)
Pt reports that he is using multiple drugs including cocaine Heroin and benzos. Pt reports smoking crack and doing heroin on Sunday and OD. Pt reports friends were able to give him Narcan and he did not seek further treatment. Pt has not used in 4 days. Pt states "I want help to get off of these drugs".  Pt ambulatory. Pt has Education officer, museum with him. Pt in NAD.

## 2015-05-02 NOTE — Discharge Instructions (Signed)
You are medically cleared to proceed with detox  Opioid Withdrawal Opioids are a group of narcotic drugs. They include the street drug heroin. They also include pain medicines, such as morphine, hydrocodone, oxycodone, and fentanyl. Opioid withdrawal is a group of characteristic physical and mental signs and symptoms. It typically occurs if you have been using opioids daily for several weeks or longer and stop using or rapidly decrease use. Opioid withdrawal can also occur if you have used opioids daily for a long time and are given a medicine to block the effect.  SIGNS AND SYMPTOMS Opioid withdrawal includes three or more of the following symptoms:   Depressed, anxious, or irritable mood.  Nausea or vomiting.  Muscle aches or spasms.   Watery eyes.   Runny nose.  Dilated pupils, sweating, or hairs standing on end.  Diarrhea or intestinal cramping.  Yawning.   Fever.  Increased blood pressure.  Fast pulse.  Restlessness or trouble sleeping. These signs and symptoms occur within several hours of stopping or reducing short-acting opioids, such as heroin. They can occur within 3 days of stopping or reducing long-acting opioids, such as methadone. Withdrawal begins within minutes of receiving a drug that blocks the effects of opioids, such as naltrexone or naloxone. DIAGNOSIS  Opioid use disorder is diagnosed by your health care provider. You will be asked about your symptoms, drug and alcohol use, medical history, and use of medicines. A physical exam may be done. Lab tests may be ordered. Your health care provider may have you see a mental health professional.  TREATMENT  The treatment for opioid withdrawal is usually provided by medical doctors with special training in substance use disorders (addiction specialists). The following medicines may be included in treatment:  Opioids given in place of the abused opioid. They turn on opioid receptors in the brain and lessen or  prevent withdrawal symptoms. They are gradually decreased (opioid substitution and taper).  Non-opioids that can lessen certain opioid withdrawal symptoms. They may be used alone or with opioid substitution and taper. Successful long-term recovery usually requires medicine, counseling, and group support. HOME CARE INSTRUCTIONS   Take medicines only as directed by your health care provider.  Check with your health care provider before starting new medicines.  Keep all follow-up visits as directed by your health care provider. SEEK MEDICAL CARE IF:  You are not able to take your medicines as directed.  Your symptoms get worse.  You relapse. SEEK IMMEDIATE MEDICAL CARE IF:  You have serious thoughts about hurting yourself or others.  You have a seizure.  You lose consciousness. Document Released: 08/19/2003 Document Revised: 12/31/2013 Document Reviewed: 08/29/2013 [facility] Patient Information 2015 [place], [place]. This information is not intended to replace advice given to you by your health care provider. Make sure you discuss any questions you have with your health care provider. Substance Abuse Treatment Programs  Intensive Outpatient Programs [facility]     [address], [place]       [facility] [place] [address] [place], [place] Outpatient     (Inpatient and outpatient)     [address]           [place] [phone] (Suboxone and Methadone)  [place]  [place], [place] [address]      [phone]       [address] [place], [place]  [facility] (Outpatient/Inpatient, [place])    (insurance only) [phone]             [facility] ([place]) [place], [place]     [facility]     [address]     [place], [place]       [facility] (for caregivers and family) [phone] [address] Kristeen [place]. [place], [place]      Residential Treatment Programs [facility]      [address], [place], [place] [address]  [phone]       [facility] [address], [place], [place] [address] [phone]  [facility] [place]        [phone]       [facility] [phone]  [facility] ([place]             [place], [place] or [place] of [place], [address] [phone]  [facility]    [address]      [place], [place]       [facility] [address] [place], [place]  [place]   [address] [place], [place] [address]     [phone]      Admissions: 8am-3pm M-F  [facility] (RTS) [address] [place], [place]  [facility] Program: Residential Program [phone] Days)   [place], [place] or [phone]     [facility]: [facility] [place], [place] (Walk in Hours over the weekend or by referral)  [facility] [place], [place], [place] [address] [phone]  Crisis Mobile: [facility]:  [phone] (for crisis response 24 hours a day) [facility] Hotline:      [phone] Outpatient Psychiatry and Counseling  [facility]: Mobile Crisis Management 24 hours:  [phone]  [facility] of the [employer] sliding scale fee and walk in schedule: M-F 8am-12pm/1pm-3pm [place], [place] [address] [place], [place] [address] [phone]  [facility] (Formerly known as [facility])- new patient walk-in appointments available Monday - Friday 8am -3pm.          [address] [place], [place] [address] [phone] or crisis line- [facility]/ Intensive Outpatient Therapy Program [place], [place] [address] [place]  Crisis Services      [phone] [address] [place], [place] [address]                 [place]   [facility] [phone]. [place], [place] [address]   [employer] of Care          [address] Johnette Abraham  [place], [place] [address]       [phone]  [facility] [address] [place], [place] [address] [phone]  [facility]    [address] [place], [place] [address]     [place], [place] Joycelyn Man     [place] [address]     [phone]       [facility] [place] [address]  [facility]     [address], [place], [place], [place] [address] [place]     [address]     [place], [place] [address]     [phone]       [facility] [address] [place], [place] [address] [phone] Resources for Temporary Residential Assistance/Crisis [facility]) M-F 8am-3pm   [address], [place] [address]   [phone] Services include: laundry, barbering, support groups, case management, phone  & computer access, showers, AA/NA mtgs, mental health/substance abuse nurse, job skills class, disability information, VA assistance,  spiritual classes, etc.   [facility]   [address] [place]     [place]              [employer] (women and children)       [place]. [place], [place] [address] [phone] [email or web address] for application and process Application Required  [facility]   [address]    [place] [address]     [phone]                    [employer] [place], [place] [address] [phone]([email or web address] application appt.) Application Required  [facility] (women only)    [address]     [place], [place] [address]     [phone]      Intake starts 6pm daily Need valid ID, SSC, & Police report [employer] [address] [place], [place] [phone] Application Required  [employer] (men only)     [place].      [place], [place]       [place] (Pregnant women only) [address] [place], [place]  [facility] [place]      [place] [address]i [address].      [place],  [address]     [phone]             [place]  [place], [place] 90 day commitment/SA/Application process  [facility](men only)     [address]     [place], [name]       Check-in at [facility] of [facility] [address] [place], [place] [address] [phone] Men/Women/Women and Children must be there by 7 pm  [place], [place]

## 2015-05-02 NOTE — ED Provider Notes (Signed)
CSN: [id number]     Arrival date & time 05/02/15  1093 History   First MD Initiated Contact with Patient 05/02/15 [phone]     No chief complaint on file.    (Consider location/radiation/quality/duration/timing/severity/associated sxs/prior Treatment) HPI Comments: Patient here requesting detox from cocaine and benzodiazepine use. Patient also uses heroin. Last use was 4 days ago and has had some body aches and diarrhea which is improving. Denies any suicidal or homicidal ideations. Has not been compliant with his diabetic medication. Recently started on psychiatric medications. No command delusions. Denies any abdominal or chest pain.  The history is provided by the patient.    Past Medical History  Diagnosis Date  . Pituitary macroadenoma 2004  . Hypertension Dx 2002  . Acromegaly 2004  . Sleep apnea 1995    on CPAP  . Arthritis Dx 2002  . Diabetes type 2, controlled 2010  . AKI (acute kidney injury) 03/21/2015  . Schizophrenia   . Schizo affective schizophrenia 1995  . Seizures   . Headache    Past Surgical History  Procedure Laterality Date  . Pituitary surgery  2005 & 2012   Family History  Problem Relation Age of Onset  . Cancer Maternal Uncle   . Cancer Maternal Grandmother   . Heart disease Neg Hx   . Hypertension Mother   . Diabetes Mother    Social History  Substance Use Topics  . Smoking status: Current Every Day Smoker -- 1.00 packs/day for 20 years    Types: Cigarettes  . Smokeless tobacco: Never Used     Comment: Smoking .5 ppd  . Alcohol Use: 1.2 oz/week    2 Cans of beer, 0 Standard drinks or equivalent per week     Comment: 2 40s a day    Review of Systems  All other systems reviewed and are negative.     Allergies  Review of patient's allergies indicates no known allergies.  Home Medications   Prior to Admission medications   Medication Sig Start Date End Date Taking? Authorizing Provider  acetaminophen-codeine (TYLENOL #3) 300-30 MG per  tablet Take 1 tablet by mouth every 8 (eight) hours as needed for moderate pain. 03/22/15   Reyne Dumas, MD  ARIPiprazole (ABILIFY) 10 MG tablet Take 10 mg by mouth daily.    Historical Provider, MD  atorvastatin (LIPITOR) 40 MG tablet Take 1 tablet (40 mg total) by mouth daily. 10/22/14   Josalyn Funches, MD  gabapentin (NEURONTIN) 400 MG capsule Take 1 capsule (400 mg total) by mouth 3 (three) times daily. 10/21/14   Boykin Nearing, MD  metFORMIN (GLUCOPHAGE) 500 MG tablet Take 1 tablet (500 mg total) by mouth 2 (two) times daily after a meal. 10/21/14   Josalyn Funches, MD  OLANZapine (ZYPREXA) 5 MG tablet Take 5 mg by mouth at bedtime.    Historical Provider, MD  predniSONE (DELTASONE) 5 MG tablet Take 2 tablets (10 mg total) by mouth daily with breakfast. 03/22/15   Reyne Dumas, MD  sertraline (ZOLOFT) 100 MG tablet Take 1 tablet (100 mg total) by mouth daily. 10/21/14   Boykin Nearing, MD  traZODone (DESYREL) 50 MG tablet Take 50 mg by mouth at bedtime. 02/28/15   Historical Provider, MD  verapamil (CALAN-SR) 180 MG CR tablet Take 1 tablet (180 mg total) by mouth at bedtime. 01/02/15   Josalyn Funches, MD   BP 138/103 mmHg  Pulse 73  Temp(Src) 98.1 F (36.7 C) (Oral)  Resp 16  Ht 6' 3.5" (1.918 m)  Wt 294 lb (133.358 kg)  BMI 36.25 kg/m2  SpO2 99% Physical Exam  Constitutional: He is oriented to person, place, and time. He appears well-developed and well-nourished.  Non-toxic appearance. No distress.  HENT:  Head: Normocephalic and atraumatic.  Eyes: Conjunctivae, EOM and lids are normal. Pupils are equal, round, and reactive to light.  Neck: Normal range of motion. Neck supple. No tracheal deviation present. No thyroid mass present.  Cardiovascular: Normal rate, regular rhythm and normal heart sounds.  Exam reveals no gallop.   No murmur heard. Pulmonary/Chest: Effort normal and breath sounds normal. No stridor. No respiratory distress. He has no decreased breath sounds. He has no  wheezes. He has no rhonchi. He has no rales.  Abdominal: Soft. Normal appearance and bowel sounds are normal. He exhibits no distension. There is no tenderness. There is no rebound and no CVA tenderness.  Musculoskeletal: Normal range of motion. He exhibits no edema or tenderness.  Neurological: He is alert and oriented to person, place, and time. He has normal strength. No cranial nerve deficit or sensory deficit. GCS eye subscore is 4. GCS verbal subscore is 5. GCS motor subscore is 6.  Skin: Skin is warm and dry. No abrasion and no rash noted.  Psychiatric: He has a normal mood and affect. His speech is normal and behavior is normal. He expresses no homicidal and no suicidal ideation. He expresses no suicidal plans and no homicidal plans.  Nursing note and vitals reviewed.   ED Course  Procedures (including critical care time) Labs Review Labs Reviewed  CBC WITH DIFFERENTIAL/PLATELET  URINALYSIS, ROUTINE W REFLEX MICROSCOPIC (NOT AT [facility])  ETHANOL  URINE RAPID DRUG SCREEN, HOSP PERFORMED  COMPREHENSIVE METABOLIC PANEL    Imaging Review No results found. I have personally reviewed and evaluated these images and lab results as part of my medical decision-making.   EKG Interpretation None      MDM   Final diagnoses:  None   Lab work was reviewed and patient is medically cleared     Lacretia Leigh, MD 05/02/15 1126

## 2015-05-07 DIAGNOSIS — F1920 Other psychoactive substance dependence, uncomplicated: Secondary | ICD-10-CM | POA: Diagnosis present

## 2015-05-07 DIAGNOSIS — R45851 Suicidal ideations: Secondary | ICD-10-CM | POA: Insufficient documentation

## 2015-05-09 ENCOUNTER — Other Ambulatory Visit: Payer: Self-pay | Admitting: *Deleted

## 2015-05-09 DIAGNOSIS — E119 Type 2 diabetes mellitus without complications: Secondary | ICD-10-CM

## 2015-05-09 MED ORDER — METFORMIN HCL 500 MG PO TABS: 500.0000 mg | ORAL_TABLET | Freq: Two times a day (BID) | ORAL | Status: DC

## 2015-05-09 NOTE — Telephone Encounter (Signed)
Patient called asking for refills on medications. He received some but does not remember the names of the medications that he's missing. Patient spoke with pharmacy; patient was told that they were waiting on approval from provider. Mentioned that he has treatment coming up and needs those meds. Please f/u with patient ASAP.

## 2015-06-27 ENCOUNTER — Encounter: Payer: Self-pay | Admitting: Psychiatry

## 2015-06-27 ENCOUNTER — Inpatient Hospital Stay
Admission: EM | Admit: 2015-06-27 | Discharge: 2015-06-30 | DRG: 885 | Disposition: A | Discharge status: Home / Self Care | Payer: Medicaid Other | Source: Other Acute Inpatient Hospital | Attending: Psychiatry | Admitting: Psychiatry

## 2015-06-27 DIAGNOSIS — M1990 Unspecified osteoarthritis, unspecified site: Secondary | ICD-10-CM | POA: Diagnosis present

## 2015-06-27 DIAGNOSIS — F17200 Nicotine dependence, unspecified, uncomplicated: Secondary | ICD-10-CM | POA: Diagnosis present

## 2015-06-27 DIAGNOSIS — I10 Essential (primary) hypertension: Secondary | ICD-10-CM | POA: Diagnosis present

## 2015-06-27 DIAGNOSIS — F329 Major depressive disorder, single episode, unspecified: Secondary | ICD-10-CM | POA: Diagnosis present

## 2015-06-27 DIAGNOSIS — G4700 Insomnia, unspecified: Secondary | ICD-10-CM | POA: Diagnosis present

## 2015-06-27 DIAGNOSIS — Z8249 Family history of ischemic heart disease and other diseases of the circulatory system: Secondary | ICD-10-CM

## 2015-06-27 DIAGNOSIS — Z818 Family history of other mental and behavioral disorders: Secondary | ICD-10-CM

## 2015-06-27 DIAGNOSIS — G894 Chronic pain syndrome: Secondary | ICD-10-CM | POA: Diagnosis present

## 2015-06-27 DIAGNOSIS — F1290 Cannabis use, unspecified, uncomplicated: Secondary | ICD-10-CM | POA: Diagnosis present

## 2015-06-27 DIAGNOSIS — Z6841 Body Mass Index (BMI) 40.0 and over, adult: Secondary | ICD-10-CM

## 2015-06-27 DIAGNOSIS — R45850 Homicidal ideations: Secondary | ICD-10-CM | POA: Diagnosis present

## 2015-06-27 DIAGNOSIS — E118 Type 2 diabetes mellitus with unspecified complications: Secondary | ICD-10-CM | POA: Diagnosis present

## 2015-06-27 DIAGNOSIS — R45851 Suicidal ideations: Secondary | ICD-10-CM | POA: Diagnosis present

## 2015-06-27 DIAGNOSIS — Z7289 Other problems related to lifestyle: Secondary | ICD-10-CM

## 2015-06-27 DIAGNOSIS — R440 Auditory hallucinations: Secondary | ICD-10-CM | POA: Diagnosis present

## 2015-06-27 DIAGNOSIS — Z79899 Other long term (current) drug therapy: Secondary | ICD-10-CM | POA: Diagnosis not present

## 2015-06-27 DIAGNOSIS — F259 Schizoaffective disorder, unspecified: Principal | ICD-10-CM | POA: Diagnosis present

## 2015-06-27 DIAGNOSIS — Z833 Family history of diabetes mellitus: Secondary | ICD-10-CM | POA: Diagnosis not present

## 2015-06-27 DIAGNOSIS — Z809 Family history of malignant neoplasm, unspecified: Secondary | ICD-10-CM | POA: Diagnosis not present

## 2015-06-27 DIAGNOSIS — E119 Type 2 diabetes mellitus without complications: Secondary | ICD-10-CM

## 2015-06-27 MED ORDER — HYDROXYZINE HCL 50 MG PO TABS
50.0000 mg | ORAL_TABLET | Freq: Three times a day (TID) | ORAL | Status: DC | PRN
  Administered 2015-06-28: 50 mg via ORAL
  Filled 2015-06-27: qty 1

## 2015-06-27 MED ORDER — ALUM & MAG HYDROXIDE-SIMETH 200-200-20 MG/5ML PO SUSP: 30.0000 mL | ORAL | Status: DC | PRN

## 2015-06-27 MED ORDER — MAGNESIUM HYDROXIDE 400 MG/5ML PO SUSP: 30.0000 mL | Freq: Every day | ORAL | Status: DC | PRN

## 2015-06-27 MED ORDER — ACETAMINOPHEN 325 MG PO TABS
650.0000 mg | ORAL_TABLET | Freq: Four times a day (QID) | ORAL | Status: DC | PRN
  Administered 2015-06-28: 650 mg via ORAL
  Filled 2015-06-27: qty 2

## 2015-06-27 NOTE — BHH Counselor (Signed)
Pt. has been accepted to [facility] from [facility]. Accepting physician is Dr. Faith Rogue. Attending Physician will be Dr. Faith Rogue. Pt. has been assigned to room 304, by [place].   Patient Access [facility]) is aware of the admission.

## 2015-06-27 NOTE — Tx Team (Signed)
Initial Interdisciplinary Treatment Plan   PATIENT STRESSORS: Financial difficulties Health problems Legal issue Marital or family conflict   PATIENT STRENGTHS: Ability for insight Average or above average intelligence Capable of independent living Communication skills   PROBLEM LIST: Problem List/Patient Goals Date to be addressed Date deferred Reason deferred Estimated date of resolution  Depression      Suicide ideation      Poly substance abuse      Homeless                                     DISCHARGE CRITERIA:  Ability to meet basic life and health needs Improved stabilization in mood, thinking, and/or behavior  PRELIMINARY DISCHARGE PLAN: Outpatient therapy Participate in family therapy  PATIENT/FAMIILY INVOLVEMENT: This treatment plan has been presented to and reviewed with the patient, Duane Salazar, The  patient have been given the opportunity to ask questions and make suggestions.  Jebidiah Baggerly A Idris Edmundson 06/27/2015, 11:00 PM

## 2015-06-27 NOTE — BH Assessment (Signed)
Assessment Note  Duane Salazar is an 39 y.o. male who was referred to the [place], as a direct admit from [facility]. He was seen due to voicing SI with a plan to overdose on his medication and or jump off a bridge. Most recent stressor is his material problems. Per the referral of [name], the patient's wife, recently filed a 50B against him. The details of what happened is unclear.  He has a history of psychosis and it is unclear if he is in treatment for it. Patient is endorsing A/H, with commands and they are telling him to kill himself.  He admits to abusing; Alcohol, Cocaine & THC.  At this time, patient is unable to contract for safety.  Patient is recommended Psychtatric Inpatient Treatment for stabilization.   Diagnosis: Schizoaffective Disorder; Depressive Type  Past Medical History:  Past Medical History  Diagnosis Date  . Pituitary macroadenoma 2004  . Hypertension Dx 2002  . Acromegaly 2004  . Sleep apnea 1995    on CPAP  . Arthritis Dx 2002  . Diabetes type 2, controlled 2010  . AKI (acute kidney injury) 03/21/2015  . Schizophrenia   . Schizo affective schizophrenia 1995  . Seizures   . Headache     Past Surgical History  Procedure Laterality Date  . Pituitary surgery  2005 & 2012    Family History:  Family History  Problem Relation Age of Onset  . Cancer Maternal Uncle   . Cancer Maternal Grandmother   . Heart disease Neg Hx   . Hypertension Mother   . Diabetes Mother     Social History:  reports that he has been smoking Cigarettes.  He has a 20 pack-year smoking history. He has never used smokeless tobacco. He reports that he drinks about 1.2 oz of alcohol per week. He reports that he uses illicit drugs (Cocaine and Marijuana).  Additional Social History:  Alcohol / Drug Use History of alcohol / drug use?: Yes Longest period of sobriety (when/how long): Unknown Negative Consequences of Use: Personal relationships, Financial, Work /  School Withdrawal Symptoms:  (None Reported) Substance #1 Name of Substance 1: Cocaine Substance #2 Name of Substance 2: Amphetamines Substance #3 Name of Substance 3:  THC Substance #4 Name of Substance 4:  Benzodiazepines  CIWA:   COWS:    Allergies: No Known Allergies  Home Medications:  No prescriptions prior to admission    OB/GYN Status:  No LMP for male patient.  General Assessment Data Location of Assessment: [facility] ED TTS Assessment: Out of system [facility] Referral) Is this a Tele or Face-to-Face Assessment?: Tele Assessment (Direct Admit Referral) Is this an Initial Assessment or a Re-assessment for this encounter?: Initial Assessment (Direct Admit Referral) Marital status: Married [place] name: n/a Is patient pregnant?: No Pregnancy Status: No Living Arrangements: Spouse/significant other Can pt return to current living arrangement?: Yes Admission Status: Involuntary Is patient capable of signing voluntary admission?: No Referral Source: Other Consulting civil engineer) Insurance type: None  Medical Screening Exam ([place]) Medical Exam completed: Yes  Crisis Care Plan Living Arrangements: Spouse/significant other Name of Psychiatrist: None Reported Name of Therapist: None Reported  Education Status Is patient currently in school?: No Current Grade: n/a Highest grade of school patient has completed: GED Name of school: n/a Contact person: n/a  Risk to self with the past 6 months Suicidal Ideation: Yes-Currently Present Suicidal Intent: Yes-Currently Present Has patient had any suicidal intent within the past 6 months prior to admission? :  Yes Is patient at risk for suicide?: Yes Suicidal Plan?: Yes-Currently Present Has patient had any suicidal plan within the past 6 months prior to admission? : Yes Specify Current Suicidal Plan: Medication Overdose Access to Means: Yes Specify Access to Suicidal Means: Overdose on medication What has been your use of  drugs/alcohol within the last 12 months?: Alcohol, Amphetamines, THC & Benzodiazepines Previous Attempts/Gestures: No How many times?: 0 Other Self Harm Risks: None Reported Triggers for Past Attempts: Hallucinations, Other (Comment), Family contact, Other personal contacts (Depression) Intentional Self Injurious Behavior: None Family Suicide History: Unknown Recent stressful life event(s): Conflict (Comment), Financial Problems, Legal Issues, Other (Comment) (Martial Problems) Persecutory voices/beliefs?: No Depression: Yes Depression Symptoms: Tearfulness, Feeling angry/irritable, Guilt, Loss of interest in usual pleasures, Feeling worthless/self pity Substance abuse history and/or treatment for substance abuse?: Yes Suicide prevention information given to non-admitted patients: Not applicable  Risk to Others within the past 6 months Homicidal Ideation: No Does patient have any lifetime risk of violence toward others beyond the six months prior to admission? : No Thoughts of Harm to Others: No Current Homicidal Intent: No Current Homicidal Plan: No Access to Homicidal Means: No Identified Victim: None Reported History of harm to others?: Yes Assessment of Violence: In distant past Violent Behavior Description: Wife took out a 50B Does patient have access to weapons?: No Criminal Charges Pending?: Yes Describe Pending Criminal Charges: Misdemeanor Larceny  Does patient have a court date: Yes Court Date: 07/08/15 Is patient on probation?: No  Psychosis Hallucinations: Auditory Delusions:  (Paranoid )  Mental Status Report Appearance/Hygiene: Unremarkable (Per the report of [name]) Eye Contact: Fair (Per the report of [name]) Motor Activity: Freedom of movement (Per the report of [name]) Speech: Logical/coherent (Per the report of [name]) Level of Consciousness: Alert (Per the report of [name]) Mood: Depressed, Anxious, Helpless, Sad, Pleasant (Per the report of  [name]) Affect: Anxious, Depressed, Sad, Blunted (Per the report of [name]) Anxiety Level: Moderate Thought Processes: Circumstantial (Per the report of [name]) Judgement: Impaired Orientation: Person, Place, Time, Situation, Appropriate for developmental age Obsessive Compulsive Thoughts/Behaviors: Minimal  Cognitive Functioning Concentration: Decreased Memory: Recent Intact, Remote Intact IQ: Average Insight: Poor Impulse Control: Poor Appetite: Good Weight Loss: 0 Weight Gain: 0 Sleep: No Change Total Hours of Sleep: 8 Vegetative Symptoms: None  ADLScreening [facility] Assessment Services) Patient's cognitive ability adequate to safely complete daily activities?: Yes Patient able to express need for assistance with ADLs?: Yes Independently performs ADLs?: Yes (appropriate for developmental age)  Prior Inpatient Therapy Prior Inpatient Therapy: Yes Prior Therapy Dates: 2006 Prior Therapy Facilty/Provider(s): Unknown Reason for Treatment: Depression and SI  Prior Outpatient Therapy Prior Outpatient Therapy: No Prior Therapy Dates: n/a Prior Therapy Facilty/Provider(s): n/a Reason for Treatment: n/a Does patient have an ACCT team?: No Does patient have Intensive In-House Services?  : No Does patient have [name] services? : No [facility]) Does patient have P4CC services?: No  ADL Screening (condition at time of admission) Patient's cognitive ability adequate to safely complete daily activities?: Yes Patient able to express need for assistance with ADLs?: Yes Independently performs ADLs?: Yes (appropriate for developmental age)       Abuse/Neglect Assessment (Assessment to be complete while patient is alone) Physical Abuse: Denies Verbal Abuse: Denies Sexual Abuse: Denies Exploitation of patient/patient's resources: Denies Self-Neglect: Denies Values / Beliefs Cultural Requests During Hospitalization: None Spiritual Requests During Hospitalization:  None Consults Spiritual Care Consult Needed: No Social Work Consult Needed: No  Additional Information 1:1 In Past 12 Months?: No CIRT Risk: No Elopement Risk: No Does patient have medical clearance?: Yes  Child/Adolescent Assessment Running Away Risk: Denies (Patient is an adult)  Disposition:  Disposition Initial Assessment Completed for this Encounter: Yes Disposition of Patient: Inpatient treatment program Type of inpatient treatment program: Adult  On Site Evaluation by:   Reviewed with Physician:     Gunnar Fusi, MS, LCAS, LPC, [place], CCSI 06/27/2015 8:29 PM

## 2015-06-27 NOTE — Plan of Care (Signed)
Problem: Consults Goal: [facility] General Treatment Patient Education Outcome: Progressing General education completed, pt verbalizes understanding.

## 2015-06-28 DIAGNOSIS — F251 Schizoaffective disorder, depressive type: Secondary | ICD-10-CM

## 2015-06-28 LAB — GLUCOSE, CAPILLARY: Glucose-Capillary: 311 mg/dL — ABNORMAL HIGH (ref 65–99)

## 2015-06-28 MED ORDER — TRAZODONE HCL 100 MG PO TABS
100.0000 mg | ORAL_TABLET | Freq: Every day | ORAL | Status: DC
  Administered 2015-06-28 – 2015-06-29 (×2): 100 mg via ORAL
  Filled 2015-06-28 (×2): qty 1

## 2015-06-28 MED ORDER — PREDNISONE 5 MG PO TABS
7.5000 mg | ORAL_TABLET | Freq: Every day | ORAL | Status: DC
  Administered 2015-06-29 – 2015-06-30 (×2): 7.5 mg via ORAL
  Filled 2015-06-28 (×3): qty 1

## 2015-06-28 MED ORDER — OLANZAPINE 10 MG PO TABS
15.0000 mg | ORAL_TABLET | Freq: Every day | ORAL | Status: DC
  Administered 2015-06-28 – 2015-06-29 (×2): 15 mg via ORAL
  Filled 2015-06-28 (×2): qty 2

## 2015-06-28 MED ORDER — GABAPENTIN 600 MG PO TABS
600.0000 mg | ORAL_TABLET | Freq: Three times a day (TID) | ORAL | Status: DC
  Administered 2015-06-28 – 2015-06-29 (×5): 600 mg via ORAL
  Filled 2015-06-28 (×5): qty 1

## 2015-06-28 MED ORDER — CLONIDINE HCL 0.1 MG PO TABS
0.2000 mg | ORAL_TABLET | Freq: Every day | ORAL | Status: DC
  Administered 2015-06-28 – 2015-06-30 (×3): 0.2 mg via ORAL
  Filled 2015-06-28 (×3): qty 2

## 2015-06-28 MED ORDER — NICOTINE 21 MG/24HR TD PT24
21.0000 mg | MEDICATED_PATCH | Freq: Every day | TRANSDERMAL | Status: DC
  Administered 2015-06-28 – 2015-06-30 (×3): 21 mg via TRANSDERMAL
  Filled 2015-06-28 (×4): qty 1

## 2015-06-28 MED ORDER — SERTRALINE HCL 25 MG PO TABS
150.0000 mg | ORAL_TABLET | Freq: Every day | ORAL | Status: DC
  Administered 2015-06-28 – 2015-06-30 (×3): 150 mg via ORAL
  Filled 2015-06-28 (×3): qty 1

## 2015-06-28 MED ORDER — VERAPAMIL HCL ER 240 MG PO TBCR
240.0000 mg | EXTENDED_RELEASE_TABLET | Freq: Every day | ORAL | Status: DC
  Administered 2015-06-29 – 2015-06-30 (×2): 240 mg via ORAL
  Filled 2015-06-28 (×3): qty 1

## 2015-06-28 NOTE — Progress Notes (Signed)
Pt os a 39year old male received under IVC from [facility] for depression and suicide ideations. Pt is alert and oriented x3, calm and cooperative with admission process. Pt's vital is stable, pt said he has degenerative disc and is always on  Joint pain; pt refused PRN for the pain. Pt is a polysubstance abuser, uses cocaine daily and drinks alcohol daily. Pt currently contracting to safety. Pt said currently lives with his mother whom he's not going to return home with and prior to living with his mother was in jail. Pt searched for contraband and none found, skin assessment is unremarkable besides the tattoos to the back, chest and hands. Pt oriented to the unit and room, no concerns voiced, safety maintained.

## 2015-06-28 NOTE — BHH Group Notes (Signed)
[place] LCSW Group Therapy  06/28/2015 2:35 PM  Type of Therapy:  Group Therapy  Participation Level:  None  Participation Quality:  Attentive  Affect:  Flat  Cognitive:  Alert  Insight:  Limited  Engagement in Therapy:  Limited  Modes of Intervention:  Discussion, Education, Socialization and Support  Summary of Progress/Problems: Pt will identify unhealthy thoughts and how they impact their emotions and behavior. Pt will be encouraged to discuss these thoughts, emotions and behaviors with the group. Pt attended group and stayed the entire time. He sat quietly and listened to other group members share.   [place] MSW, [place]  06/28/2015, 2:35 PM

## 2015-06-28 NOTE — Progress Notes (Signed)
Patient with depressed affect, cooperative behavior with meals, meds and plan of care. No SI/HI at this time. Reports auditory hallucinations are present. Patient social with select male peer, verbalizes needs appropriately with staff. No meds scheduled at this time, takes prn Tylenol and Atarax with good effect. Safety maintained.

## 2015-06-28 NOTE — BHH Suicide Risk Assessment (Signed)
[facility] Admission Suicide Risk Assessment   Nursing information obtained from:    Demographic factors:    Current Mental Status:    Loss Factors:    Historical Factors:    Risk Reduction Factors:    Total Time spent with patient: 1 hour Principal Problem: <principal problem not specified> Diagnosis:   Patient Active Problem List   Diagnosis Date Noted  . Schizoaffective disorder ([place]) [F25.9] 06/27/2015  . Syncope and collapse [R55] 03/21/2015  . AKI (acute kidney injury) ([place]) [N17.9] 03/21/2015  . Hypokalemia [E87.6] 03/21/2015  . Hypotension [I95.9] 03/21/2015  . 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 Arterial hypotension [I95.9]   . Laceration of forehead [S01.81XA]   . Faintness [R55] 03/11/2015  . Chronic pain syndrome [G89.4] 03/11/2015  . Pituitary macroadenoma ([place]) [D35.2] 03/11/2015  . Right Achilles tendinitis [M76.61] 01/02/2015  . Chronic headache [R51] 01/02/2015  . Right maxillary sinusitis, chronic [J32.0] 01/02/2015  . 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 Syncope [R55] 01/02/2015  . Onychomycosis of toenail [B35.1] 01/02/2015  . HTN (hypertension) [I10] 10/21/2014  . Depression [F32.9] 10/21/2014  . Morbid obesity with BMI of 40.0-44.9, adult ([place]) [E66.01, Z68.41] 10/21/2014  . Sleep apnea [G47.30]   . 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[place]) [E22.0]   . Arthritis [M19.90]   . DM2 (diabetes mellitus, type 2) ([place]) [E11.9]      Continued Clinical Symptoms:  Alcohol Use Disorder Identification Test Final Score (AUDIT): 31 The "Alcohol Use Disorders Identification Test", Guidelines for Use in Primary Care, Second Edition.  World Pharmacologist [facility]). Score between 0-7:  no or low risk or alcohol related problems. Score between 8-15:  moderate risk of alcohol related problems. Score between 16-19:  high risk of alcohol related problems. Score 20 or above:  warrants further diagnostic evaluation for alcohol dependence and  treatment.   CLINICAL FACTORS:   Depression:   Aggression Comorbid alcohol abuse/dependence Alcohol/Substance Abuse/Dependencies Chronic Pain   Musculoskeletal: Strength & Muscle Tone: within normal limits Gait & Station: normal Patient leans: N/A  Psychiatric Specialty Exam: Physical Exam  ROS  Blood pressure 158/108, pulse 57, temperature 98.2 F (36.8 C), temperature source Oral, resp. rate 20, height 6' 3.5" (1.918 m), weight 134.718 kg (297 lb), SpO2 99 %.Body mass index is 36.62 kg/(m^2).  See H and P                                                       COGNITIVE FEATURES THAT CONTRIBUTE TO RISK:  None    SUICIDE RISK:   Mild:  Suicidal ideation of limited frequency, intensity, duration, and specificity.  There are no identifiable plans, no associated intent, mild dysphoria and related symptoms, good self-control (both objective and subjective assessment), few other risk factors, and identifiable protective factors, including available and accessible social support. Patient denies any past suicide attempts. He does abuse substances. He is having auditory hallucinations. We will address the auditory hallucinations through antipsychotic medication. We will try to arrange for patient to have counseling regards to substance abuse issues. We will adjust his medications to address his depressed mood.  PLAN OF CARE:  Daily contact with patient to assess and evaluate symptoms and progress in treatment, Medication management and Plan   Schizoaffective disorder-we're going to restart patient on Zyprexa. However per records it appears  in computers been on 5 mg a day. We are going to increase his dose to 15 mg a day. We will continue his Zoloft at 150 mg daily.   Insomnia-trazodone.   Hypertension-continue verapamil and clonidine. 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 We'll also provide ibuprofen as needed.  Substance use disorder(opioid and cocaine)-patient will participate in milieu to develop insight and healthy habits. Consider referral to a residential or intensive substance abuse outpatient program.  Medical Decision Making:  Established Problem, Worsening (2), Review of Medication Regimen & Side Effects (2) and Review of New Medication or Change in Dosage (2)  I certify that inpatient services furnished can reasonably be expected to improve the patient's condition.   Faith Rogue 06/28/2015, 10:48 AM

## 2015-06-28 NOTE — Progress Notes (Signed)
Urine sample obtained with results pending.  
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
## 2015-06-28 NOTE — Progress Notes (Signed)
Blood sugar result of 311 obtained by MHT. Patient refused any treatment for blood sugar. Stated on no circumstance would he take anything by mouth or SQ injection. Stated he used to take PO Metformin but doesn't anymore. Dr. Jimmye Norman paged and notified. Dr. Jimmye Norman stated he will speak to him in the am concerning blood glucose and treatment.

## 2015-06-28 NOTE — Plan of Care (Signed)
Problem: Consults Goal: Suicide Risk Patient Education (See Patient Education module for education specifics)  Outcome: Progressing Denies SI/HI at this time. Auditory hallucinations are manageable and meds are initiated.MD in to evaluate and assess.

## 2015-06-29 LAB — CBC WITH DIFFERENTIAL/PLATELET
Basophils Absolute: 0.1 10*3/uL (ref 0–0.1)
Basophils Relative: 1 %
Eosinophils Absolute: 0.4 10*3/uL (ref 0–0.7)
Eosinophils Relative: 6 %
HCT: 44.6 % (ref 40.0–52.0)
Hemoglobin: 14.5 g/dL (ref 13.0–18.0)
Lymphocytes Relative: 33 %
Lymphs Abs: 2.2 10*3/uL (ref 1.0–3.6)
MCH: 23.7 pg — ABNORMAL LOW (ref 26.0–34.0)
MCHC: 32.5 g/dL (ref 32.0–36.0)
MCV: 72.8 fL — ABNORMAL LOW (ref 80.0–100.0)
Monocytes Absolute: 0.8 10*3/uL (ref 0.2–1.0)
Monocytes Relative: 12 %
Neutro Abs: 3.2 10*3/uL (ref 1.4–6.5)
Neutrophils Relative %: 48 %
Platelets: 237 10*3/uL (ref 150–440)
RBC: 6.12 MIL/uL — ABNORMAL HIGH (ref 4.40–5.90)
RDW: 16.2 % — ABNORMAL HIGH (ref 11.5–14.5)
WBC: 6.8 10*3/uL (ref 3.8–10.6)

## 2015-06-29 LAB — GLUCOSE, CAPILLARY: Glucose-Capillary: 211 mg/dL — ABNORMAL HIGH (ref 65–99)

## 2015-06-29 LAB — COMPREHENSIVE METABOLIC PANEL
ALT: 753 U/L — ABNORMAL HIGH (ref 17–63)
AST: 366 U/L — ABNORMAL HIGH (ref 15–41)
Albumin: 4 g/dL (ref 3.5–5.0)
Alkaline Phosphatase: 161 U/L — ABNORMAL HIGH (ref 38–126)
Anion gap: 6 (ref 5–15)
BUN: 9 mg/dL (ref 6–20)
CO2: 29 mmol/L (ref 22–32)
Calcium: 9.2 mg/dL (ref 8.9–10.3)
Chloride: 102 mmol/L (ref 101–111)
Creatinine, Ser: 0.84 mg/dL (ref 0.61–1.24)
GFR calc Af Amer: >60 mL/min (ref >60)
GFR calc non Af Amer: >60 mL/min (ref >60)
Glucose, Bld: 218 mg/dL — ABNORMAL HIGH (ref 65–99)
Potassium: 4 mmol/L (ref 3.5–5.1)
Sodium: 137 mmol/L (ref 135–145)
Total Bilirubin: 0.9 mg/dL (ref 0.3–1.2)
Total Protein: 6.9 g/dL (ref 6.5–8.1)

## 2015-06-29 LAB — TSH: TSH: 0.87 u[IU]/mL (ref 0.350–4.500)

## 2015-06-29 MED ORDER — GABAPENTIN 300 MG PO CAPS
600.0000 mg | ORAL_CAPSULE | Freq: Three times a day (TID) | ORAL | Status: DC
  Administered 2015-06-29 – 2015-06-30 (×3): 600 mg via ORAL
  Filled 2015-06-29 (×3): qty 2

## 2015-06-29 NOTE — B[facility] Group Notes (Signed)
Protection Group Notes:  (Nursing/MHT/Case Management/Adjunct)  Date:  06/29/2015  Time:  9:27 AM  Type of Therapy:  Goal-Setting  Participation Level:  Active  Participation Quality:  Appropriate and Attentive  Affect:  Appropriate  Cognitive:  Alert and Appropriate  Insight:  Good  Engagement in Group:  Engaged  Modes of Intervention:  Discussion  Summary of Progress/Problems:  Cheryll Dessert 06/29/2015, 9:27 AM

## 2015-06-29 NOTE — Progress Notes (Signed)
Was in TV room watching TV and interacting with peers at onset of shift. Had a Blood Glucose of 311 and had no coverage ordered. Did state would refuse any intervention. Dr Jimmye Norman paged and notified. Hamish denied and A,V,H and SI, HI. Was pleasant to interact with. Medication compliant with scheduled meds. Retreated to room for rest. Had an uneventful night.

## 2015-06-29 NOTE — BHH Group Notes (Signed)
BHH LCSW Group Therapy  06/29/2015 6:34 PM  Type of Therapy:  Group Therapy  Participation Level:  Did Not Attend  Modes of Intervention:  Discussion, Education, Socialization and Support  Summary of Progress/Problems: Todays topic: Grudges  Patients will be encouraged to discuss their thoughts, feelings, and behaviors as to why one holds on to grudges and reasons why people have grudges. Patients will process the impact of grudges on their daily lives and identify thoughts and feelings related to holding grudges. Patients will identify feelings and thoughts related to what life would look like without grudges.   Ebert Forrester L Yazid Pop MSW, LCSWA  06/29/2015, 6:34 PM 
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
## 2015-06-29 NOTE — Progress Notes (Signed)
Patient with depressed affect and cooperative behavior with meals, meds and plan of care. No SI/HI at this time, auditory hallucinations present. Good appetite and good adls. Social with peers, verbalizes needs appropriately with staff. Safety maintained.

## 2015-06-29 NOTE — Progress Notes (Signed)
[facility] MD Progress Note  06/29/2015 1:13 PM Duane Salazar  MRN:  [id number] Subjective:  Patient indicates today his mood is improved. He is interested in being discharged tomorrow. Last night nursing and inadvertently checked his blood sugar even though there were no orders to check his blood sugar. At that time it was 311. 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 I spent time talking with patient about perhaps getting a fasting blood sugar to more accurately assess any status of his diabetes. Patient is adamant he does not want any studies. I talked some about the consequences of uncontrolled diabetes. He states he'll just follow his primary care physician. He is not interested in monitoring his diabetes at this time nor interested in any treatment.  Feels like his medications are appropriate and feels like he is ready for discharge. Principal Problem: <principal problem not specified> Diagnosis:   Patient Active Problem List   Diagnosis Date Noted  . Schizoaffective disorder ([place]) [F25.9] 06/27/2015  . Syncope and collapse [R55] 03/21/2015  . AKI (acute kidney injury) ([place]) [N17.9] 03/21/2015  . Hypokalemia [E87.6] 03/21/2015  . Hypotension [I95.9] 03/21/2015  . 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 Arterial hypotension [I95.9]   . Laceration of forehead [S01.81XA]   . Faintness [R55] 03/11/2015  . Chronic pain syndrome [G89.4] 03/11/2015  . 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 Right Achilles tendinitis [M76.61] 01/02/2015  . Chronic headache [R51] 01/02/2015  . Right maxillary sinusitis, chronic [J32.0] 01/02/2015  . 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 Syncope [R55] 01/02/2015  . Onychomycosis of toenail [B35.1] 01/02/2015  . HTN (hypertension) [I10] 10/21/2014  . Depression [F32.9] 10/21/2014  . Morbid obesity with BMI of  40.0-44.9, adult ([place]) [E66.01, Z68.41] 10/21/2014  . Sleep apnea [G47.30]   . 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 Arthritis [M19.90]   . DM2 (diabetes mellitus, type 2) ([facility]) [E11.9]    Total Time spent with patient: 20 minutes  Past Psychiatric History:   Past Medical History:  Past Medical History  Diagnosis Date  . 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 Hypertension Dx 2002  . 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 Sleep apnea 1995    on CPAP  . Arthritis Dx 2002  . Diabetes type 2, controlled ([place]) 2010  . AKI (acute kidney injury) ([place]) 03/21/2015  . Schizophrenia ([place])   . Schizo affective schizophrenia ([place]) 1995  . Seizures ([place])   . Headache     Past Surgical History  Procedure Laterality Date  . Pituitary surgery  2005 & 2012   Family History:  Family History  Problem Relation Age of Onset  . Cancer Maternal Uncle   . Cancer Maternal Grandmother   . Heart disease Neg Hx   . Hypertension Mother   . Diabetes Mother    Family Psychiatric  History:  Social History:  History  Alcohol Use  . 1.2 oz/week  . 2 Cans of beer, 0 Standard drinks or equivalent per week    Comment: 2 40s a day     History  Drug Use  . Yes  . Special: Cocaine, Marijuana    Comment: Reports he has not used recently    Social History   Social History  . Marital Status: Married    Spouse Name: N/A  . Number of Children: 2  . Years of Education: GED   Social History Main  Topics  . Smoking status: Current Every Day Smoker -- 1.00 packs/day for 20 years    Types: Cigarettes  . Smokeless tobacco: Never Used     Comment: Smoking .5 ppd  . Alcohol Use: 1.2 oz/week    2 Cans of beer, 0 Standard drinks or equivalent per week     Comment: 2 40s a day  . Drug Use: Yes    Special: Cocaine, Marijuana     Comment: Reports he has not used recently  . Sexual Activity: Yes   Other Topics Concern  . None   Social History Narrative   Lives with mom.   Incarcerated for 22 months in [place], [place]. From  2014-09/2014   Additional Social History:    History of alcohol / drug use?: Yes Longest period of sobriety (when/how long): Unknown Negative Consequences of Use: Personal relationships, Financial, Work / School Withdrawal Symptoms:  (None Reported) Name of Substance 1: Cocaine Name of Substance 2: Amphetamines Name of Substance 3:  [place] Name of Substance 4:  Benzodiazepines            Sleep: Good  Appetite:  Good  Current Medications: Current Facility-Administered Medications  Medication Dose Route Frequency Provider Last Rate Last Dose  . acetaminophen (TYLENOL) tablet 650 mg  650 mg Oral Q6H PRN Marjie Skiff, MD   650 mg at 06/28/15 9629  . alum & mag hydroxide-simeth (MAALOX/MYLANTA) 200-200-20 MG/5ML suspension 30 mL  30 mL Oral Q4H PRN Marjie Skiff, MD      . cloNIDine (CATAPRES) tablet 0.2 mg  0.2 mg Oral Daily Marjie Skiff, MD   0.2 mg at 06/29/15 0919  . gabapentin (NEURONTIN) tablet 600 mg  600 mg Oral TID Marjie Skiff, MD   600 mg at 06/29/15 5284  . hydrOXYzine (ATARAX/VISTARIL) tablet 50 mg  50 mg Oral TID PRN Marjie Skiff, MD   50 mg at 06/28/15 1324  . magnesium hydroxide (MILK OF MAGNESIA) suspension 30 mL  30 mL Oral Daily PRN Marjie Skiff, MD      . nicotine (NICODERM CQ - dosed in mg/24 hours) patch 21 mg  21 mg Transdermal Daily Marjie Skiff, MD   21 mg at 06/29/15 4010  . OLANZapine (ZYPREXA) tablet 15 mg  15 mg Oral QHS Marjie Skiff, MD   15 mg at 06/28/15 2139  . predniSONE (DELTASONE) tablet 7.5 mg  7.5 mg Oral Q breakfast Marjie Skiff, MD   7.5 mg at 06/29/15 0919  . sertraline (ZOLOFT) tablet 150 mg  150 mg Oral Daily Marjie Skiff, MD   150 mg at 06/29/15 0919  . traZODone (DESYREL) tablet 100 mg  100 mg Oral QHS Marjie Skiff, MD   100 mg at 06/28/15 2139  . verapamil (CALAN-SR) CR tablet 240 mg  240 mg Oral Daily Marjie Skiff, MD   240 mg at 06/29/15 0919    Lab Results:  Results for orders placed or  performed during the hospital encounter of 06/27/15 (from the past 48 hour(s))  Glucose, capillary     Status: Abnormal   Collection Time: 06/28/15  8:03 PM  Result Value Ref Range   Glucose-Capillary 311 (H) 65 - 99 mg/dL  CBC with Differential/Platelet     Status: Abnormal   Collection Time: 06/29/15  6:36 AM  Result Value Ref Range   WBC 6.8 3.8 - 10.6 K/uL   RBC 6.12 (H) 4.40 - 5.90 MIL/uL   Hemoglobin  14.5 13.0 - 18.0 g/dL   HCT 44.6 40.0 - 52.0 %   MCV 72.8 (L) 80.0 - 100.0 fL   MCH 23.7 (L) 26.0 - 34.0 pg   MCHC 32.5 32.0 - 36.0 g/dL   RDW 16.2 (H) 11.5 - 14.5 %   Platelets 237 150 - 440 K/uL   Neutrophils Relative % 48 %   Neutro Abs 3.2 1.4 - 6.5 K/uL   Lymphocytes Relative 33 %   Lymphs Abs 2.2 1.0 - 3.6 K/uL   Monocytes Relative 12 %   Monocytes Absolute 0.8 0.2 - 1.0 K/uL   Eosinophils Relative 6 %   Eosinophils Absolute 0.4 0 - 0.7 K/uL   Basophils Relative 1 %   Basophils Absolute 0.1 0 - 0.1 K/uL  Comprehensive metabolic panel     Status: Abnormal   Collection Time: 06/29/15  6:36 AM  Result Value Ref Range   Sodium 137 135 - 145 mmol/L   Potassium 4.0 3.5 - 5.1 mmol/L   Chloride 102 101 - 111 mmol/L   CO2 29 22 - 32 mmol/L   Glucose, Bld 218 (H) 65 - 99 mg/dL   BUN 9 6 - 20 mg/dL   Creatinine, Ser 0.84 0.61 - 1.24 mg/dL   Calcium 9.2 8.9 - 10.3 mg/dL   Total Protein 6.9 6.5 - 8.1 g/dL   Albumin 4.0 3.5 - 5.0 g/dL   AST 366 (H) 15 - 41 U/L   ALT 753 (H) 17 - 63 U/L   Alkaline Phosphatase 161 (H) 38 - 126 U/L   Total Bilirubin 0.9 0.3 - 1.2 mg/dL   GFR calc non Af Amer >60 >60 mL/min   GFR calc Af Amer >60 >60 mL/min    Comment: (NOTE) The eGFR has been calculated using the CKD EPI equation. This calculation has not been validated in all clinical situations. eGFR's persistently <60 mL/min signify possible Chronic Kidney Disease.    Anion gap 6 5 - 15  TSH     Status: None   Collection Time: 06/29/15  6:36 AM  Result Value Ref Range   TSH 0.870  0.350 - 4.500 uIU/mL  Glucose, capillary     Status: Abnormal   Collection Time: 06/29/15  7:51 AM  Result Value Ref Range   Glucose-Capillary 211 (H) 65 - 99 mg/dL    Physical Findings: AIMS:  , ,  ,  ,    CIWA:    COWS:     Musculoskeletal: Strength & Muscle Tone: within normal limits Gait & Station: normal Patient leans: N/A  Psychiatric Specialty Exam: Review of Systems  Psychiatric/Behavioral: Positive for substance abuse. Negative for depression, suicidal ideas, hallucinations and memory loss. The patient is not nervous/anxious and does not have insomnia.   All other systems reviewed and are negative.   Blood pressure 158/108, pulse 66, temperature 98.6 F (37 C), temperature source Oral, resp. rate 20, height 6' 3.5" (1.918 m), weight 134.718 kg (297 lb), SpO2 99 %.Body mass index is 36.62 kg/(m^2).  General Appearance: Fairly Groomed  Engineer, water::  Good  Speech:  Normal Rate  Volume:  Normal  Mood:  Good  Affect:  Congruent, able to smile   Thought Process:  Negative  Orientation:  Full (Time, Place, and Person)  Thought Content:  Negative  Suicidal Thoughts:  No  Homicidal Thoughts:  No  Memory:  Immediate;   Good Recent;   Good Remote;   Good  Judgement:  Fair  Insight:  Fair  Psychomotor  Activity:  Negative  Concentration:  Good  Recall:  Good  Fund of Knowledge:Good  Language: Good  Akathisia:  Negative  Handed:    AIMS (if indicated):     Assets:  Desire for Improvement  ADL's:  Intact  Cognition: WNL  Sleep:  Number of Hours: 7.25   Treatment Plan Summary: Daily contact with patient to assess and evaluate symptoms and progress in treatment, Medication management and Plan   Schizoaffective disorder-we're going to restart patient on Zyprexa. However per records it appears in computers been on 5 mg a day. We are going to increase his dose to 15 mg a day. We will continue his Zoloft at 150 mg daily.  Insomnia-trazodone.   Hypertension-continue  verapamil and clonidine. 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 We'll also provide ibuprofen as needed.  Substance use disorder(opioid and cocaine)-patient will participate in milieu to develop insight and healthy habits. Consider referral to a residential or intensive substance abuse outpatient program.   Diabetes-patient has had some elevated blood sugars although they have not been fasting and have been after meals and this morning's was after he had to use. He declines any assessment of his diabetes and his fasting blood sugar levels are hemoglobin A1c at this time. He indicates he does have a plan a follow-up is primary care physician.  Faith Rogue 06/29/2015, 1:13 PM

## 2015-06-30 MED ORDER — ATORVASTATIN CALCIUM 40 MG PO TABS: 40.0000 mg | ORAL_TABLET | Freq: Every day | ORAL | Status: DC

## 2015-06-30 MED ORDER — ARIPIPRAZOLE ER 400 MG IM SUSR: 400.0000 mg | INTRAMUSCULAR | Status: DC

## 2015-06-30 MED ORDER — TRAZODONE HCL 100 MG PO TABS: 100.0000 mg | ORAL_TABLET | Freq: Every day | ORAL | Status: DC

## 2015-06-30 MED ORDER — PREDNISONE 2.5 MG PO TABS: 7.5000 mg | ORAL_TABLET | Freq: Every day | ORAL | Status: DC

## 2015-06-30 MED ORDER — OLANZAPINE 15 MG PO TABS: 15.0000 mg | ORAL_TABLET | Freq: Every day | ORAL | Status: DC

## 2015-06-30 MED ORDER — CLONIDINE HCL 0.2 MG PO TABS: 0.2000 mg | ORAL_TABLET | Freq: Two times a day (BID) | ORAL | Status: DC

## 2015-06-30 MED ORDER — GABAPENTIN 300 MG PO CAPS: 600.0000 mg | ORAL_CAPSULE | Freq: Three times a day (TID) | ORAL | Status: DC

## 2015-06-30 MED ORDER — VERAPAMIL HCL ER 240 MG PO TBCR: 240.0000 mg | EXTENDED_RELEASE_TABLET | Freq: Every day | ORAL | Status: DC

## 2015-06-30 MED ORDER — METFORMIN HCL 500 MG PO TABS: 500.0000 mg | ORAL_TABLET | Freq: Two times a day (BID) | ORAL | Status: DC

## 2015-06-30 MED ORDER — SERTRALINE HCL 50 MG PO TABS: 150.0000 mg | ORAL_TABLET | Freq: Every day | ORAL | Status: DC

## 2015-06-30 NOTE — BHH Suicide Risk Assessment (Signed)
[place] INPATIENT:  Family/Significant Other Suicide Prevention Education  Suicide Prevention Education:  Education Completed; Legrande Hao (mother) [phone] has been identified by the patient as the family member/significant other with whom the patient will be residing, and identified as the person(s) who will aid the patient in the event of a mental health crisis (suicidal ideations/suicide attempt).  With written consent from the patient, the family member/significant other has been provided the following suicide prevention education, prior to the and/or following the discharge of the patient.  The suicide prevention education provided includes the following:  Suicide risk factors  Suicide prevention and interventions  National Suicide Hotline telephone number  [facility] assessment telephone number  [facility] Psychiatric Emergency Assistance [place] and/or Residential Mobile Crisis Unit telephone number  Request made of family/significant other to:  Remove weapons (e.g., guns, rifles, knives), all items previously/currently identified as safety concern.    Remove drugs/medications (over-the-counter, prescriptions, illicit drugs), all items previously/currently identified as a safety concern.  The family member/significant other verbalizes understanding of the suicide prevention education information provided.  The family member/significant other agrees to remove the items of safety concern listed above.  [name], MSW, LSWA 06/30/2015, 11:18 AM

## 2015-06-30 NOTE — BHH Group Notes (Signed)
[facility] LCSW Group Therapy  06/30/2015 3:33 PM  Type of Therapy:  Group Therapy  Participation Level:  Active  Participation Quality:  Appropriate and Attentive  Affect:  Appropriate  Cognitive:  Alert, Appropriate and Oriented  Insight:  Engaged  Engagement in Therapy:  Engaged  Modes of Intervention:  Discussion, Socialization and Support  Summary of Progress/Problems: Patient attended and participated in group discussion appropriately. Patient introduced himself and participated in introductions appropriately. Patient shared that he has been institutionalized and can see how the trauma from that has kept him from maturing mentally but wants to look towards the future and not worry so much about his past and focus on his family and bettering himself.  Patient shared that his faith is important to him and related to several other group members in that area who were able to connect and offer support to each other.  [name], MSW, LCSWA 06/30/2015, 3:33 PM

## 2015-06-30 NOTE — BHH Suicide Risk Assessment (Signed)
[facility] Discharge Suicide Risk Assessment   Demographic Factors:  Male, Low socioeconomic status and Unemployed  Total Time spent with patient: 30 minutes   Psychiatric Specialty Exam: Physical Exam  Review of Systems  Constitutional: Negative.   HENT: Negative.   Eyes: Negative.   Respiratory: Negative.   Cardiovascular: Negative.   Gastrointestinal: Negative.   Genitourinary: Negative.   Musculoskeletal: Negative.   Skin: Negative.   Neurological: Negative.   Endo/Heme/Allergies: Negative.   Psychiatric/Behavioral: Negative.     Blood pressure 143/98, pulse 57, temperature 97.4 F (36.3 C), temperature source Oral, resp. rate 20, height 6' 3.5" (1.918 m), weight 134.718 kg (297 lb), SpO2 99 %.Body mass index is 36.62 kg/(m^2).                                                       Have you used any form of tobacco in the last 30 days? (Cigarettes, Smokeless Tobacco, Cigars, and/or Pipes): Yes  Has this patient used any form of tobacco in the last 30 days? (Cigarettes, Smokeless Tobacco, Cigars, and/or Pipes) Yes, A prescription for an FDA-approved tobacco cessation medication was offered at discharge and the patient refused  Mental Status Per Nursing Assessment::   On Admission:     Current Mental Status by Physician: denies SI, HI or hallucinations. Calm, pleasant, cooperative.  Denies anxiety, irritability or depression.  Hopeful and future oriented  Loss Factors: Decline in physical health and Financial problems/change in socioeconomic status  Historical Factors: Impulsivity  Risk Reduction Factors:   Living with another person, especially a relative and Positive social support  Continued Clinical Symptoms:  Alcohol/Substance Abuse/Dependencies Schizophrenia:   Paranoid or undifferentiated type  Cognitive Features That Contribute To Risk:  None    Suicide Risk:  Minimal: No identifiable suicidal ideation.  Patients presenting with no  risk factors but with morbid ruminations; may be classified as minimal risk based on the severity of the depressive symptoms  Principal Problem: <principal problem not specified> Discharge Diagnoses:  Patient Active Problem List   Diagnosis Date Noted  . Schizoaffective disorder ([place]) [F25.9] 06/27/2015  . Syncope and collapse [R55] 03/21/2015  . AKI (acute kidney injury) ([place]) [N17.9] 03/21/2015  . Hypokalemia [E87.6] 03/21/2015  . Hypotension [I95.9] 03/21/2015  . 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 Arterial hypotension [I95.9]   . Laceration of forehead [S01.81XA]   . Faintness [R55] 03/11/2015  . Chronic pain syndrome [G89.4] 03/11/2015  . 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 Right Achilles tendinitis [M76.61] 01/02/2015  . Chronic headache [R51] 01/02/2015  . Right maxillary sinusitis, chronic [J32.0] 01/02/2015  . 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 Syncope [R55] 01/02/2015  . Onychomycosis of toenail [B35.1] 01/02/2015  . HTN (hypertension) [I10] 10/21/2014  . Depression [F32.9] 10/21/2014  . Morbid obesity with BMI of 40.0-44.9, adult ([place]) [E66.01, Z68.41] 10/21/2014  . Sleep apnea [G47.30]   . 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 Arthritis [M19.90]   . DM2 (diabetes mellitus, type 2) ([place]) [E11.9]      Is patient on multiple antipsychotic therapies at discharge:  Yes,   Do you recommend tapering to monotherapy for antipsychotics?  Yes   Has Patient had three or more failed trials of antipsychotic monotherapy by history:  No  Recommended Plan for Multiple Antipsychotic Therapies: Taper  to monotherapy as described:  d/c olanzapine and continue with injectable abilify    Hildred Priest 06/30/2015, 10:05 AM

## 2015-06-30 NOTE — Progress Notes (Signed)
Recreation Therapy Notes  Date: 10.31.16 Time: 3:00 pm Location: Craft Room  Group Topic: Wellness  Goal Area(s) Addresses:  Patient will identify at least one item per dimension of health. Patient will examine areas they are deficient in.  Behavioral Response: Attentive, Interactive  Intervention: 6 Dimensions of Health  Activity: Patients were given a worksheet with the definitions of the 6 dimensions of health. Patients were given a second worksheet with the 6 dimensions of health on it and instructed to write out at least one item they are currently doing in each dimension.  Education: LRT educated patients on way they can improve each dimension.  Education Outcome: Acknowledges education/In group clarification offered  Clinical Observations/Feedback: Patient completed activity by writing at least one item in each dimension. Patient contributed to group discussion by stating how he can improve certain wellness areas.  Leonette [name], LRT/CTRS 06/30/2015 4:06 PM
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
## 2015-06-30 NOTE — Progress Notes (Signed)
  [facility] Adult Case Management Discharge Plan :  Will you be returning to the same living situation after discharge:  Yes,  home with his mother At discharge, do you have transportation home?: No. Patient is provided $3 PART bus fare to [place] and a [place] ticket from the [employer] to his home Do you have the ability to pay for your medications: Yes,  patient receives medications through [facility]  Release of information consent forms completed and in the chart;  Patient's signature needed at discharge.  Patient to Follow up at: Follow-up Information    Follow up with [name]. Go on 07/02/2015.   Why:  For follow-up care appt Wednesday 07/02/15 at 8:00am (walk in appt M-F 8-3)   Contact information:   [address], [place] Ph [phone] Fax [phone]      Patient denies SI/HI: Yes,  patient denies SI/HI    Safety Planning and Suicide Prevention discussed: Yes,  SPE discussed with patient and his mother Yardley Lekas [phone]  Have you used any form of tobacco in the last 30 days? (Cigarettes, Smokeless Tobacco, Cigars, and/or Pipes): Yes  Has patient been referred to the Quitline?: Patient refused referral  [name], MSW, LCSWA 06/30/2015, 12:09 PM

## 2015-06-30 NOTE — Progress Notes (Signed)
In TV room interacting with peers at onset of shift. Medication compliant and pleasant to interact with. Denied A,V,H, SI, HI. Had an uneventful night.

## 2015-06-30 NOTE — Progress Notes (Signed)
Patient denies SI/HI, denies A/V hallucinations. Patient verbalizes understanding of discharge instructions, follow up care and prescriptions. Patient given all belongings from  locker. Patient escorted out by staff to the bus stop.

## 2015-07-06 ENCOUNTER — Encounter (HOSPITAL_COMMUNITY): Payer: Self-pay | Admitting: Oncology

## 2015-07-06 ENCOUNTER — Emergency Department (HOSPITAL_COMMUNITY)
Admission: EM | Admit: 2015-07-06 | Discharge: 2015-07-06 | Disposition: A | Discharge status: Other Acute Inpatient Hospital | Payer: Federal, State, Local not specified - Other | Attending: Emergency Medicine | Admitting: Emergency Medicine

## 2015-07-06 ENCOUNTER — Encounter (HOSPITAL_COMMUNITY): Payer: Self-pay | Admitting: *Deleted

## 2015-07-06 ENCOUNTER — Observation Stay (HOSPITAL_COMMUNITY)
Admission: AD | Admit: 2015-07-06 | Discharge: 2015-07-08 | Disposition: A | Discharge status: Home / Self Care | Payer: Federal, State, Local not specified - Other | Source: Intra-hospital | Attending: Psychiatry | Admitting: Psychiatry

## 2015-07-06 DIAGNOSIS — F17210 Nicotine dependence, cigarettes, uncomplicated: Secondary | ICD-10-CM | POA: Insufficient documentation

## 2015-07-06 DIAGNOSIS — G894 Chronic pain syndrome: Secondary | ICD-10-CM | POA: Insufficient documentation

## 2015-07-06 DIAGNOSIS — R7401 Elevation of levels of liver transaminase levels: Secondary | ICD-10-CM

## 2015-07-06 DIAGNOSIS — E230 Hypopituitarism: Secondary | ICD-10-CM | POA: Insufficient documentation

## 2015-07-06 DIAGNOSIS — F1210 Cannabis abuse, uncomplicated: Secondary | ICD-10-CM | POA: Insufficient documentation

## 2015-07-06 DIAGNOSIS — R748 Abnormal levels of other serum enzymes: Secondary | ICD-10-CM | POA: Insufficient documentation

## 2015-07-06 DIAGNOSIS — F1410 Cocaine abuse, uncomplicated: Secondary | ICD-10-CM | POA: Insufficient documentation

## 2015-07-06 DIAGNOSIS — F251 Schizoaffective disorder, depressive type: Secondary | ICD-10-CM | POA: Diagnosis not present

## 2015-07-06 DIAGNOSIS — G4730 Sleep apnea, unspecified: Secondary | ICD-10-CM | POA: Insufficient documentation

## 2015-07-06 DIAGNOSIS — Z6841 Body Mass Index (BMI) 40.0 and over, adult: Secondary | ICD-10-CM | POA: Insufficient documentation

## 2015-07-06 DIAGNOSIS — M1990 Unspecified osteoarthritis, unspecified site: Secondary | ICD-10-CM | POA: Insufficient documentation

## 2015-07-06 DIAGNOSIS — I10 Essential (primary) hypertension: Secondary | ICD-10-CM | POA: Insufficient documentation

## 2015-07-06 DIAGNOSIS — Z86018 Personal history of other benign neoplasm: Secondary | ICD-10-CM | POA: Insufficient documentation

## 2015-07-06 DIAGNOSIS — F1120 Opioid dependence, uncomplicated: Secondary | ICD-10-CM | POA: Diagnosis present

## 2015-07-06 DIAGNOSIS — Z9981 Dependence on supplemental oxygen: Secondary | ICD-10-CM | POA: Insufficient documentation

## 2015-07-06 DIAGNOSIS — Z720 Tobacco use: Secondary | ICD-10-CM | POA: Insufficient documentation

## 2015-07-06 DIAGNOSIS — F1920 Other psychoactive substance dependence, uncomplicated: Secondary | ICD-10-CM

## 2015-07-06 DIAGNOSIS — Z79899 Other long term (current) drug therapy: Secondary | ICD-10-CM | POA: Insufficient documentation

## 2015-07-06 DIAGNOSIS — F1910 Other psychoactive substance abuse, uncomplicated: Secondary | ICD-10-CM

## 2015-07-06 DIAGNOSIS — F1020 Alcohol dependence, uncomplicated: Secondary | ICD-10-CM | POA: Insufficient documentation

## 2015-07-06 DIAGNOSIS — N179 Acute kidney failure, unspecified: Secondary | ICD-10-CM | POA: Insufficient documentation

## 2015-07-06 DIAGNOSIS — F259 Schizoaffective disorder, unspecified: Secondary | ICD-10-CM | POA: Diagnosis not present

## 2015-07-06 DIAGNOSIS — E119 Type 2 diabetes mellitus without complications: Secondary | ICD-10-CM | POA: Insufficient documentation

## 2015-07-06 DIAGNOSIS — R740 Nonspecific elevation of levels of transaminase and lactic acid dehydrogenase [LDH]: Secondary | ICD-10-CM | POA: Insufficient documentation

## 2015-07-06 DIAGNOSIS — R45851 Suicidal ideations: Secondary | ICD-10-CM | POA: Diagnosis not present

## 2015-07-06 DIAGNOSIS — Z7952 Long term (current) use of systemic steroids: Secondary | ICD-10-CM | POA: Insufficient documentation

## 2015-07-06 LAB — COMPREHENSIVE METABOLIC PANEL
ALT: 632 U/L — ABNORMAL HIGH (ref 17–63)
AST: 510 U/L — ABNORMAL HIGH (ref 15–41)
Albumin: 4.2 g/dL (ref 3.5–5.0)
Alkaline Phosphatase: 150 U/L — ABNORMAL HIGH (ref 38–126)
Anion gap: 11 (ref 5–15)
BUN: 12 mg/dL (ref 6–20)
CO2: 21 mmol/L — ABNORMAL LOW (ref 22–32)
Calcium: 9 mg/dL (ref 8.9–10.3)
Chloride: 102 mmol/L (ref 101–111)
Creatinine, Ser: 0.79 mg/dL (ref 0.61–1.24)
GFR calc Af Amer: >60 mL/min (ref >60)
GFR calc non Af Amer: >60 mL/min (ref >60)
Glucose, Bld: 152 mg/dL — ABNORMAL HIGH (ref 65–99)
Potassium: 3.9 mmol/L (ref 3.5–5.1)
Sodium: 134 mmol/L — ABNORMAL LOW (ref 135–145)
Total Bilirubin: 3.2 mg/dL — ABNORMAL HIGH (ref 0.3–1.2)
Total Protein: 7.2 g/dL (ref 6.5–8.1)

## 2015-07-06 LAB — CBC
HCT: 44.3 % (ref 39.0–52.0)
Hemoglobin: 15 g/dL (ref 13.0–17.0)
MCH: 23.5 pg — ABNORMAL LOW (ref 26.0–34.0)
MCHC: 33.9 g/dL (ref 30.0–36.0)
MCV: 69.4 fL — ABNORMAL LOW (ref 78.0–100.0)
Platelets: 180 10*3/uL (ref 150–400)
RBC: 6.38 MIL/uL — ABNORMAL HIGH (ref 4.22–5.81)
RDW: 16.7 % — ABNORMAL HIGH (ref 11.5–15.5)
WBC: 6.3 10*3/uL (ref 4.0–10.5)

## 2015-07-06 LAB — SALICYLATE LEVEL: Salicylate Lvl: <4 mg/dL (ref 2.8–30.0)

## 2015-07-06 LAB — ACETAMINOPHEN LEVEL: Acetaminophen (Tylenol), Serum: <10 ug/mL — ABNORMAL LOW (ref 10–30)

## 2015-07-06 LAB — RAPID URINE DRUG SCREEN, HOSP PERFORMED
Amphetamines: NOT DETECTED
Barbiturates: NOT DETECTED
Benzodiazepines: NOT DETECTED
Cocaine: POSITIVE — AB
Opiates: POSITIVE — AB
Tetrahydrocannabinol: POSITIVE — AB

## 2015-07-06 LAB — AMMONIA: Ammonia: 54 umol/L — ABNORMAL HIGH (ref 9–35)

## 2015-07-06 LAB — ETHANOL: Alcohol, Ethyl (B): 75 mg/dL — ABNORMAL HIGH (ref <5)

## 2015-07-06 MED ORDER — ACETAMINOPHEN 325 MG PO TABS
650.0000 mg | ORAL_TABLET | Freq: Four times a day (QID) | ORAL | Status: DC | PRN
  Administered 2015-07-08: 650 mg via ORAL
  Filled 2015-07-06: qty 2

## 2015-07-06 MED ORDER — VERAPAMIL HCL ER 120 MG PO TBCR
120.0000 mg | EXTENDED_RELEASE_TABLET | Freq: Every day | ORAL | Status: DC
  Administered 2015-07-07 – 2015-07-08 (×2): 120 mg via ORAL
  Filled 2015-07-06 (×3): qty 1

## 2015-07-06 MED ORDER — ALUM & MAG HYDROXIDE-SIMETH 200-200-20 MG/5ML PO SUSP: 30.0000 mL | ORAL | Status: DC | PRN

## 2015-07-06 MED ORDER — GABAPENTIN 300 MG PO CAPS
600.0000 mg | ORAL_CAPSULE | Freq: Three times a day (TID) | ORAL | Status: DC
  Administered 2015-07-07 – 2015-07-08 (×4): 600 mg via ORAL
  Filled 2015-07-06 (×4): qty 2

## 2015-07-06 MED ORDER — ATORVASTATIN CALCIUM 40 MG PO TABS
40.0000 mg | ORAL_TABLET | Freq: Every day | ORAL | Status: DC
  Administered 2015-07-06: 40 mg via ORAL
  Filled 2015-07-06: qty 1

## 2015-07-06 MED ORDER — ARIPIPRAZOLE ER 400 MG IM SUSR: 400.0000 mg | INTRAMUSCULAR | Status: DC

## 2015-07-06 MED ORDER — GABAPENTIN 300 MG PO CAPS
600.0000 mg | ORAL_CAPSULE | Freq: Three times a day (TID) | ORAL | Status: DC
  Administered 2015-07-06: 600 mg via ORAL
  Filled 2015-07-06: qty 2

## 2015-07-06 MED ORDER — TRAZODONE HCL 100 MG PO TABS
100.0000 mg | ORAL_TABLET | Freq: Every day | ORAL | Status: DC
  Administered 2015-07-06 – 2015-07-07 (×2): 100 mg via ORAL
  Filled 2015-07-06 (×2): qty 1

## 2015-07-06 MED ORDER — VITAMIN B-1 100 MG PO TABS
100.0000 mg | ORAL_TABLET | Freq: Every day | ORAL | Status: DC
  Administered 2015-07-06: 100 mg via ORAL
  Filled 2015-07-06: qty 1

## 2015-07-06 MED ORDER — TRAZODONE HCL 100 MG PO TABS: 100.0000 mg | ORAL_TABLET | Freq: Every day | ORAL | Status: DC

## 2015-07-06 MED ORDER — THIAMINE HCL 100 MG/ML IJ SOLN: 100.0000 mg | Freq: Every day | INTRAMUSCULAR | Status: DC

## 2015-07-06 MED ORDER — LACTULOSE 10 GM/15ML PO SOLN
20.0000 g | Freq: Three times a day (TID) | ORAL | Status: DC
  Administered 2015-07-06: 20 g via ORAL
  Filled 2015-07-06 (×3): qty 30

## 2015-07-06 MED ORDER — SERTRALINE HCL 50 MG PO TABS
150.0000 mg | ORAL_TABLET | Freq: Every day | ORAL | Status: DC
  Administered 2015-07-06: 150 mg via ORAL
  Filled 2015-07-06: qty 3

## 2015-07-06 MED ORDER — LORAZEPAM 1 MG PO TABS: 0.0000 mg | ORAL_TABLET | Freq: Two times a day (BID) | ORAL | Status: DC

## 2015-07-06 MED ORDER — MAGNESIUM HYDROXIDE 400 MG/5ML PO SUSP: 30.0000 mL | Freq: Every day | ORAL | Status: DC | PRN

## 2015-07-06 MED ORDER — LOSARTAN POTASSIUM 50 MG PO TABS
50.0000 mg | ORAL_TABLET | Freq: Every day | ORAL | Status: DC
  Administered 2015-07-06: 50 mg via ORAL
  Filled 2015-07-06: qty 1

## 2015-07-06 MED ORDER — VERAPAMIL HCL ER 240 MG PO TBCR
240.0000 mg | EXTENDED_RELEASE_TABLET | Freq: Every day | ORAL | Status: DC
  Administered 2015-07-06: 240 mg via ORAL
  Filled 2015-07-06: qty 1

## 2015-07-06 MED ORDER — OLANZAPINE 5 MG PO TABS: 15.0000 mg | ORAL_TABLET | Freq: Every day | ORAL | Status: DC

## 2015-07-06 MED ORDER — VERAPAMIL HCL ER 240 MG PO TBCR: 240.0000 mg | EXTENDED_RELEASE_TABLET | Freq: Every day | ORAL | Status: DC

## 2015-07-06 MED ORDER — OLANZAPINE 7.5 MG PO TABS
15.0000 mg | ORAL_TABLET | Freq: Every day | ORAL | Status: DC
  Administered 2015-07-06 – 2015-07-07 (×2): 15 mg via ORAL
  Filled 2015-07-06 (×2): qty 2

## 2015-07-06 MED ORDER — SERTRALINE HCL 50 MG PO TABS
150.0000 mg | ORAL_TABLET | Freq: Every day | ORAL | Status: DC
  Administered 2015-07-07 – 2015-07-08 (×2): 150 mg via ORAL
  Filled 2015-07-06 (×4): qty 1

## 2015-07-06 MED ORDER — CLONIDINE HCL 0.1 MG PO TABS
0.2000 mg | ORAL_TABLET | Freq: Two times a day (BID) | ORAL | Status: DC
  Administered 2015-07-06: 0.2 mg via ORAL
  Filled 2015-07-06: qty 2

## 2015-07-06 MED ORDER — PREDNISONE 5 MG PO TABS
7.5000 mg | ORAL_TABLET | Freq: Every day | ORAL | Status: DC
  Administered 2015-07-06: 7.5 mg via ORAL
  Filled 2015-07-06 (×2): qty 1

## 2015-07-06 MED ORDER — METFORMIN HCL 500 MG PO TABS
500.0000 mg | ORAL_TABLET | Freq: Two times a day (BID) | ORAL | Status: DC
  Administered 2015-07-06: 500 mg via ORAL
  Filled 2015-07-06 (×3): qty 1

## 2015-07-06 MED ORDER — METFORMIN HCL 500 MG PO TABS
500.0000 mg | ORAL_TABLET | Freq: Two times a day (BID) | ORAL | Status: DC
  Administered 2015-07-07 – 2015-07-08 (×3): 500 mg via ORAL
  Filled 2015-07-06 (×3): qty 1

## 2015-07-06 MED ORDER — ATORVASTATIN CALCIUM 40 MG PO TABS
40.0000 mg | ORAL_TABLET | Freq: Every day | ORAL | Status: DC
  Administered 2015-07-07 – 2015-07-08 (×2): 40 mg via ORAL
  Filled 2015-07-06 (×2): qty 1

## 2015-07-06 MED ORDER — LORAZEPAM 1 MG PO TABS: 0.0000 mg | ORAL_TABLET | Freq: Four times a day (QID) | ORAL | Status: DC

## 2015-07-06 NOTE — ED Notes (Signed)
Dr a and Theodoro Clock into see

## 2015-07-06 NOTE — ED Provider Notes (Addendum)
CSN: [id number]     Arrival date & time 07/06/15  [id number] History   First MD Initiated Contact with Patient 07/06/15 [phone]     Chief Complaint  Patient presents with  . Suicidal    Level 5 caveat due to altered mental status. (Consider location/radiation/quality/duration/timing/severity/associated sxs/prior Treatment) The history is provided by the patient.   patient is reportedly suicidal or hallucinating. History of same. Also history of substance abuse. Patient is not very cooperative with his history.  Past Medical History  Diagnosis Date  . 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 Hypertension Dx 2002  . 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 Sleep apnea 1995    on CPAP  . Arthritis Dx 2002  . Diabetes type 2, controlled ([place]) 2010  . AKI (acute kidney injury) ([place]) 03/21/2015  . Schizophrenia ([place])   . Schizo affective schizophrenia ([place]) 1995  . Seizures ([place])   . Headache    Past Surgical History  Procedure Laterality Date  . Pituitary surgery  2005 & 2012   Family History  Problem Relation Age of Onset  . Cancer Maternal Uncle   . Cancer Maternal Grandmother   . Heart disease Neg Hx   . Hypertension Mother   . Diabetes Mother    Social History  Substance Use Topics  . Smoking status: Current Every Day Smoker -- 1.00 packs/day for 20 years    Types: Cigarettes  . Smokeless tobacco: Never Used     Comment: Smoking .5 ppd  . Alcohol Use: 1.2 oz/week    2 Cans of beer, 0 Standard drinks or equivalent per week     Comment: 2 40s a day    Review of Systems  Unable to perform ROS: Psychiatric disorder  Endocrine: Negative for polyuria.      Allergies  Review of patient's allergies indicates no known allergies.  Home Medications   Prior to Admission medications   Medication Sig Start Date End Date Taking? Authorizing Provider  ARIPiprazole (ABILIFY MAINTENA) 400 MG SUSR Inject 400 mg into the muscle every 30 (thirty) days. 07/25/15  Yes Hildred Priest, MD   atorvastatin (LIPITOR) 40 MG tablet Take 1 tablet (40 mg total) by mouth daily. 06/30/15  Yes Hildred Priest, MD  cloNIDine (CATAPRES) 0.2 MG tablet Take 1 tablet (0.2 mg total) by mouth 2 (two) times daily. 06/30/15  Yes Hildred Priest, MD  gabapentin (NEURONTIN) 300 MG capsule Take 2 capsules (600 mg total) by mouth 3 (three) times daily. 06/30/15  Yes Hildred Priest, MD  losartan (COZAAR) 50 MG tablet Take 50 mg by mouth daily. 05/02/15  Yes Historical Provider, MD  metFORMIN (GLUCOPHAGE) 500 MG tablet Take 1 tablet (500 mg total) by mouth 2 (two) times daily after a meal. 06/30/15  Yes Hildred Priest, MD  MOBIC 15 MG tablet Take 15 mg by mouth daily as needed. For inflammation/pain. 05/02/15  Yes Historical Provider, MD  OLANZapine (ZYPREXA) 15 MG tablet Take 1 tablet (15 mg total) by mouth at bedtime. 06/30/15  Yes Hildred Priest, MD  predniSONE (DELTASONE) 2.5 MG tablet Take 3 tablets (7.5 mg total) by mouth daily with breakfast. 06/30/15  Yes Hildred Priest, MD  sertraline (ZOLOFT) 50 MG tablet Take 3 tablets (150 mg total) by mouth daily. 06/30/15  Yes Hildred Priest, MD  traZODone (DESYREL) 100 MG tablet Take 1 tablet (100 mg total) by mouth at bedtime. 06/30/15  Yes Hildred Priest, MD  verapamil (CALAN-SR) 240 MG CR tablet Take 1 tablet (240 mg total) by mouth daily.  06/30/15  Yes Hildred Priest, MD   BP 121/78 mmHg  Pulse 81  Temp(Src) 97.5 F (36.4 C) (Oral)  Resp 15  Ht 6\' 3"  (1.905 m)  Wt 300 lb (136.079 kg)  BMI 37.50 kg/m2  SpO2 98% Physical Exam  Constitutional: He appears well-developed.  HENT:  Head: Atraumatic.  Eyes:  Conjunctival injection  Neck: Neck supple.  Cardiovascular: Normal rate.   Pulmonary/Chest: Effort normal.  Abdominal: There is no tenderness.  Musculoskeletal: Normal range of motion.  Neurological: He is alert.  Skin: Skin is warm.  Psychiatric:   Flat affect and somewhat uncooperative.    ED Course  Procedures (including critical care time) Labs Review Labs Reviewed  COMPREHENSIVE METABOLIC PANEL - Abnormal; Notable for the following:    Sodium 134 (*)    CO2 21 (*)    Glucose, Bld 152 (*)    AST 510 (*)    ALT 632 (*)    Alkaline Phosphatase 150 (*)    Total Bilirubin 3.2 (*)    All other components within normal limits  ETHANOL - Abnormal; Notable for the following:    Alcohol, Ethyl (B) 75 (*)    All other components within normal limits  ACETAMINOPHEN LEVEL - Abnormal; Notable for the following:    Acetaminophen (Tylenol), Serum <10 (*)    All other components within normal limits  CBC - Abnormal; Notable for the following:    RBC 6.38 (*)    MCV 69.4 (*)    MCH 23.5 (*)    RDW 16.7 (*)    All other components within normal limits  URINE RAPID DRUG SCREEN, HOSP PERFORMED - Abnormal; Notable for the following:    Opiates POSITIVE (*)    Cocaine POSITIVE (*)    Tetrahydrocannabinol POSITIVE (*)    All other components within normal limits  AMMONIA - Abnormal; Notable for the following:    Ammonia 54 (*)    All other components within normal limits  SALICYLATE LEVEL  HEPATITIS PANEL, ACUTE    Imaging Review No results found. I have personally reviewed and evaluated these images and lab results as part of my medical decision-making.   EKG Interpretation None      MDM   Final diagnoses:  Schizoaffective disorder, depressive type ([place])  Transaminitis  Suicidal ideations  Substance abuse  Polysubstance dependence including opioid type drug, continuous use (HCC)  Schizoaffective disorder, unspecified ([place])    Patient with suicidal thoughts substance abuse. Cocaine and marijuana positive. Patient medically cleared and will be admitted to behavioral health. Ammonia slightly elevated LFTs also elevated. More elevated than it has been in the past. Will need evaluation for this but could be done as an  outpatient or at behavioral health Hospital.   Davonna Belling, MD 07/06/15 [place], MD 07/06/15 1655

## 2015-07-06 NOTE — ED Notes (Signed)
Pt ambulatory w/o difficulty to [facility] w/ phelam, belongings given to driver.

## 2015-07-06 NOTE — Progress Notes (Signed)
D: no complaints.  Wanted something to drink.  Strong body odor. Could not remain awake for temp probe to stay in his mouth. A: gave Gatorade.  Refused shower. R: will continue to offer shower.  Monitor for safety.  Denies SI/HI.

## 2015-07-06 NOTE — H&P (Signed)
[place] Assessment Adult    Patient Identification: Duane Salazar MRN: [id number] Principal Diagnosis: Schizoaffective disorder, unspecified ([place]) Diagnosis:  Patient Active Problem List   Diagnosis Date Noted  . Schizoaffective disorder, unspecified ([place]) [F25.9] 07/06/2015    Priority: High  . Polysubstance dependence including opioid type drug, continuous use ([place]) [F11.20, F19.20] 07/06/2015    Priority: High  . Schizoaffective disorder ([place]) [F25.9] 06/27/2015  . Syncope and collapse [R55] 03/21/2015  . AKI (acute kidney injury) ([place]) [N17.9] 03/21/2015  . Hypokalemia [E87.6] 03/21/2015  . Hypotension [I95.9] 03/21/2015  . 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 Arterial hypotension [I95.9]   . Laceration of forehead [S01.81XA]   . Faintness [R55] 03/11/2015  . Chronic pain syndrome [G89.4] 03/11/2015  . 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 Right Achilles tendinitis [M76.61] 01/02/2015  . Chronic headache [R51] 01/02/2015  . Right maxillary sinusitis, chronic [J32.0] 01/02/2015  . 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 Syncope [R55] 01/02/2015  . Onychomycosis of toenail [B35.1] 01/02/2015  . HTN (hypertension) [I10] 10/21/2014  . Depression [F32.9] 10/21/2014  . Morbid obesity with BMI of 40.0-44.9, adult ([place]) [E66.01, Z68.41] 10/21/2014  . Sleep apnea [G47.30]   . 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 Arthritis [M19.90]   . DM2 (diabetes mellitus, type 2) ([place]) [E11.9]     Total Time spent with patient: 45 minutes  Subjective:  Duane Salazar is a 39 y.o. male patient admitted to [facility] Unit for stabilization.   *From previous assessment by Waylan Boga DNP consult note:  DEY:CXKGYJ [name] is a  39 yo male presented [facility] the [facility] after using heroin, cocaine, marijuana, and alcohol with intermittent, passive  suicidal ideations. History of schizoaffective disorder. Patient discharged from [facility] last Monday, 10/31. On assessment, he denies suicidal/homicidal ideations, hallucinations, and withdrawal symptoms. He also did not want to go to detox or rehab. However, when the psychiatrist asked if he was ready to go home, he quickly said no and he needed help of his mental health. He had been living with his mother and does not want to return. [name] has been on a drug binge the past week and did not attend his follow-up appointment or take his medications.   Patient appeared sleepy during his assessment and was a poor historian during assessment in the [place] [facility]. Patient reported that cocaine was his main drug of choice. [name] denied any daily use of alcohol. Patient during ROS denied symptoms of fatigue, nausea, abdominal or dark urine, which would be consistent with acute viral hepatitis. However, his liver enzymes are noted to have sharply increased since his previous chemistry panel from 06/29/2015. His ammonia level is currently slightly elevated at 54 but patient is currently alert and oriented. He currently is denying any suicidal ideation stating "It's passive. I don't have a plan" or psychotic symptoms.   Past Psychiatric History: schizoaffective disorder  Risk to Self: Suicidal Ideation: No Suicidal Intent: No Is patient at risk for suicide?: No Suicidal Plan?: No Specify Current Suicidal Plan: Denies Access to Means: No Specify Access to Suicidal Means: Denies What has been your use of drugs/alcohol within the last 12 months?: THC, Alcohol, Heroin How many times?: 0 Other Self Harm Risks: Denies Triggers for Past Attempts: None known Intentional Self Injurious Behavior: None Risk to Others: Homicidal Ideation: No Thoughts of Harm to Others: No Current Homicidal Intent: No Current Homicidal Plan: No Access to Homicidal Means: No Identified  Victim: None  reported History of harm to others?: No Assessment of Violence: None Noted Violent Behavior Description: Denies Does patient have access to weapons?: No Criminal Charges Pending?: No Describe Pending Criminal Charges: N/A Does patient have a court date: No Prior Inpatient Therapy: Prior Inpatient Therapy: Yes Prior Therapy Dates: 2016 Prior Therapy Facilty/Provider(s): [facility] Reason for Treatment: Depression Prior Outpatient Therapy: Prior Outpatient Therapy: Yes Prior Therapy Dates: [facility] Prior Therapy Facilty/Provider(s): [facility] Reason for Treatment: [facility] Does patient have an ACCT team?: No Does patient have Intensive In-House Services? : No Does patient have [name] services? : Yes Does patient have P4CC services?: No  Past Medical History:  Past Medical History  Diagnosis Date  . 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 Hypertension Dx 2002  . 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[place]) 2004  . Sleep apnea 1995    on CPAP  . Arthritis Dx 2002  . Diabetes type 2, controlled ([place]) 2010  . AKI (acute kidney injury) ([place]) 03/21/2015  . Schizophrenia ([place])   . Schizo affective schizophrenia ([place]) 1995  . Seizures ([place])   . Headache     Past Surgical History  Procedure Laterality Date  . Pituitary surgery  2005 & 2012   Family History:  Family History  Problem Relation Age of Onset  . Cancer Maternal Uncle   . Cancer Maternal Grandmother   . Heart disease Neg Hx   . Hypertension Mother   . Diabetes Mother    Family Psychiatric History: None Social History:  History  Alcohol Use  . 1.2 oz/week  . 2 Cans of beer, 0 Standard drinks or equivalent per week    Comment: 2 40s a day    History  Drug Use  . Yes  . Special: Cocaine, Marijuana    Comment: Reports he has not used recently    Social History   Social History  . Marital Status: Married    Spouse Name: N/A  . Number  of Children: 2  . Years of Education: GED   Social History Main Topics  . Smoking status: Current Every Day Smoker -- 1.00 packs/day for 20 years    Types: Cigarettes  . Smokeless tobacco: Never Used     Comment: Smoking .5 ppd  . Alcohol Use: 1.2 oz/week    2 Cans of beer, 0 Standard drinks or equivalent per week     Comment: 2 40s a day  . Drug Use: Yes    Special: Cocaine, Marijuana     Comment: Reports he has not used recently  . Sexual Activity: Yes   Other Topics Concern  . None   Social History Narrative   Lives with mom.   Incarcerated for 22 months in [place], [place]. From 2014-09/2014   Additional Social History:   Pain Medications: See PTA Prescriptions: See PTA Over the Counter: See PTA History of alcohol / drug use?: Yes Name of Substance 1: THC 1 - Amount (size/oz): "a joint" 1 - Frequency: 2x/weekly 1 - Last Use / Amount: yesterday Name of Substance 2: Heroin 2 - Age of First Use: 38 2 - Frequency: Daily Name of Substance 3: Alcohol 3 - Age of First Use: 14 3 - Amount (size/oz): 40 ounce 3 - Frequency: daily 3 - Last Use / Amount: today               Allergies: No Known Allergies  Labs:   Lab Results Last 48 Hours    Results for orders placed or performed during the hospital encounter of 07/06/15 (from  the past 48 hour(s))  Comprehensive metabolic panel Status: Abnormal   Collection Time: 07/06/15 5:36 AM  Result Value Ref Range   Sodium 134 (L) 135 - 145 mmol/L   Potassium 3.9 3.5 - 5.1 mmol/L   Chloride 102 101 - 111 mmol/L   CO2 21 (L) 22 - 32 mmol/L   Glucose, Bld 152 (H) 65 - 99 mg/dL   BUN 12 6 - 20 mg/dL   Creatinine, Ser 0.79 0.61 - 1.24 mg/dL   Calcium 9.0 8.9 - 10.3 mg/dL   Total Protein 7.2 6.5 - 8.1 g/dL   Albumin 4.2 3.5 - 5.0 g/dL   AST 510 (H) 15 - 41 U/L   ALT 632 (H) 17 - 63 U/L   Alkaline  Phosphatase 150 (H) 38 - 126 U/L   Total Bilirubin 3.2 (H) 0.3 - 1.2 mg/dL   GFR calc non Af Amer >60 >60 mL/min   GFR calc Af Amer >60 >60 mL/min    Comment: (NOTE) The eGFR has been calculated using the CKD EPI equation. This calculation has not been validated in all clinical situations. eGFR's persistently <60 mL/min signify possible Chronic Kidney Disease.    Anion gap 11 5 - 15  Ethanol (ETOH) Status: Abnormal   Collection Time: 07/06/15 5:36 AM  Result Value Ref Range   Alcohol, Ethyl (B) 75 (H) <5 mg/dL    Comment:   LOWEST DETECTABLE LIMIT FOR SERUM ALCOHOL IS 5 mg/dL FOR MEDICAL PURPOSES ONLY   Salicylate level Status: None   Collection Time: 07/06/15 5:36 AM  Result Value Ref Range   Salicylate Lvl <5.4 2.8 - 30.0 mg/dL  Acetaminophen level Status: Abnormal   Collection Time: 07/06/15 5:36 AM  Result Value Ref Range   Acetaminophen (Tylenol), Serum <10 (L) 10 - 30 ug/mL    Comment:   THERAPEUTIC CONCENTRATIONS VARY SIGNIFICANTLY. A RANGE OF 10-30 ug/mL MAY BE AN EFFECTIVE CONCENTRATION FOR MANY PATIENTS. HOWEVER, SOME ARE BEST TREATED AT CONCENTRATIONS OUTSIDE THIS RANGE. ACETAMINOPHEN CONCENTRATIONS >150 ug/mL AT 4 HOURS AFTER INGESTION AND >50 ug/mL AT 12 HOURS AFTER INGESTION ARE OFTEN ASSOCIATED WITH TOXIC REACTIONS.   CBC Status: Abnormal   Collection Time: 07/06/15 5:36 AM  Result Value Ref Range   WBC 6.3 4.0 - 10.5 K/uL   RBC 6.38 (H) 4.22 - 5.81 MIL/uL   Hemoglobin 15.0 13.0 - 17.0 g/dL   HCT 44.3 39.0 - 52.0 %   MCV 69.4 (L) 78.0 - 100.0 fL   MCH 23.5 (L) 26.0 - 34.0 pg   MCHC 33.9 30.0 - 36.0 g/dL   RDW 16.7 (H) 11.5 - 15.5 %   Platelets 180 150 - 400 K/uL  Urine rapid drug screen (hosp performed) (Not at [facility]) Status: Abnormal   Collection Time: 07/06/15 6:34 AM  Result Value Ref Range    Opiates POSITIVE (A) NONE DETECTED   Cocaine POSITIVE (A) NONE DETECTED   Benzodiazepines NONE DETECTED NONE DETECTED   Amphetamines NONE DETECTED NONE DETECTED   Tetrahydrocannabinol POSITIVE (A) NONE DETECTED   Barbiturates NONE DETECTED NONE DETECTED    Comment:   DRUG SCREEN FOR MEDICAL PURPOSES ONLY. IF CONFIRMATION IS NEEDED FOR ANY PURPOSE, NOTIFY LAB WITHIN 5 DAYS.   LOWEST DETECTABLE LIMITS FOR URINE DRUG SCREEN Drug Class Cutoff (ng/mL) Amphetamine 1000 Barbiturate 200 Benzodiazepine 650 Tricyclics 354 Opiates 656 Cocaine 300 THC 50   Ammonia Status: Abnormal   Collection Time: 07/06/15 7:37 AM  Result Value Ref Range   Ammonia 54 (H) 9 -  35 umol/L      Current Facility-Administered Medications  Medication Dose Route Frequency Provider Last Rate Last Dose  . [START ON 07/25/2015] ARIPiprazole SUSR 400 mg 400 mg Intramuscular Q30 days Davonna Belling, MD    . atorvastatin (LIPITOR) tablet 40 mg 40 mg Oral Daily Davonna Belling, MD  40 mg at 07/06/15 1028  . cloNIDine (CATAPRES) tablet 0.2 mg 0.2 mg Oral BID Davonna Belling, MD  0.2 mg at 07/06/15 1029  . gabapentin (NEURONTIN) capsule 600 mg 600 mg Oral TID Davonna Belling, MD  600 mg at 07/06/15 1028  . lactulose (CHRONULAC) 10 GM/15ML solution 20 g 20 g Oral TID Patrecia Pour, NP    . LORazepam (ATIVAN) tablet 0-4 mg 0-4 mg Oral 4 times per day Davonna Belling, MD  0 mg at 07/06/15 0725   Followed by  . [START ON 07/08/2015] LORazepam (ATIVAN) tablet 0-4 mg 0-4 mg Oral Q12H Davonna Belling, MD    . losartan (COZAAR) tablet 50 mg 50 mg Oral Daily Davonna Belling, MD  50 mg at 07/06/15 1029  . metFORMIN (GLUCOPHAGE) tablet 500 mg 500 mg Oral BID PC Davonna Belling, MD  500 mg at 07/06/15 1028  .  OLANZapine (ZYPREXA) tablet 15 mg 15 mg Oral QHS Davonna Belling, MD    . predniSONE (DELTASONE) tablet 7.5 mg 7.5 mg Oral Q breakfast Davonna Belling, MD  7.5 mg at 07/06/15 1028  . sertraline (ZOLOFT) tablet 150 mg 150 mg Oral Daily Davonna Belling, MD  150 mg at 07/06/15 1029  . thiamine (VITAMIN B-1) tablet 100 mg 100 mg Oral Daily Davonna Belling, MD  100 mg at 07/06/15 1029   Or  . thiamine (B-1) injection 100 mg 100 mg Intravenous Daily Davonna Belling, MD    . traZODone (DESYREL) tablet 100 mg 100 mg Oral QHS Davonna Belling, MD    . verapamil (CALAN-SR) CR tablet 240 mg 240 mg Oral Daily Davonna Belling, MD  240 mg at 07/06/15 1029   Current Outpatient Prescriptions  Medication Sig Dispense Refill  . [START ON 07/25/2015] ARIPiprazole (ABILIFY MAINTENA) 400 MG SUSR Inject 400 mg into the muscle every 30 (thirty) days. 1 each   . atorvastatin (LIPITOR) 40 MG tablet Take 1 tablet (40 mg total) by mouth daily. 7 tablet 0  . cloNIDine (CATAPRES) 0.2 MG tablet Take 1 tablet (0.2 mg total) by mouth 2 (two) times daily. 14 tablet 0  . gabapentin (NEURONTIN) 300 MG capsule Take 2 capsules (600 mg total) by mouth 3 (three) times daily. 42 capsule 0  . losartan (COZAAR) 50 MG tablet Take 50 mg by mouth daily.  3  . metFORMIN (GLUCOPHAGE) 500 MG tablet Take 1 tablet (500 mg total) by mouth 2 (two) times daily after a meal. 14 tablet 0  . MOBIC 15 MG tablet Take 15 mg by mouth daily as needed. For inflammation/pain.  1  . OLANZapine (ZYPREXA) 15 MG tablet Take 1 tablet (15 mg total) by mouth at bedtime. 7 tablet 0  . predniSONE (DELTASONE) 2.5 MG tablet Take 3 tablets (7.5 mg total) by mouth daily with breakfast. 21 tablet 0  . sertraline (ZOLOFT) 50 MG tablet Take 3 tablets (150 mg total) by mouth daily. 21 tablet 0  . traZODone (DESYREL) 100 MG tablet Take 1  tablet (100 mg total) by mouth at bedtime. 7 tablet 0  . verapamil (CALAN-SR) 240 MG CR tablet Take 1 tablet (240 mg total) by mouth daily. 7 tablet 0    Musculoskeletal:  Strength & Muscle Tone: within normal limits Gait & Station: normal Patient leans: N/A  Psychiatric Specialty Exam: Review of Systems  Constitutional: Negative.  HENT: Negative.  Eyes: Negative.  Respiratory: Negative.  Cardiovascular: Negative.  Gastrointestinal: Negative.  Genitourinary: Negative.  Musculoskeletal: Negative.  Skin: Negative.  Neurological: Negative.  Endo/Heme/Allergies: Negative.  Psychiatric/Behavioral: Positive for substance abuse.    Blood pressure 151/89, pulse 82, temperature 98.1 F (36.7 C), temperature source Oral, resp. rate 16, height _0  (1.905 m), weight 136.079 kg (300 lb), SpO2 96 %.Body mass index is 37.5 kg/(m^2).  General Appearance: Disheveled  Eye Sport and exercise psychologist:: Fair  Speech: Normal Rate  Volume: Normal  Mood: Irritable  Affect: Blunt  Thought Process: Coherent  Orientation: Full (Time, Place, and Person)  Thought Content: WDL  Suicidal Thoughts: Yes. without intent/plan  Homicidal Thoughts: No  Memory: Immediate; Fair Recent; Fair Remote; Fair  Judgement: Fair  Insight: Fair  Psychomotor Activity: Decreased  Concentration: Fair  Recall: [employer] of Knowledge:Fair  Language: Fair  Akathisia: No  Handed: Right  AIMS (if indicated):    Assets: Leisure Time Physical Health Resilience Social Support  ADL's: Intact  Cognition: WNL  Sleep:     Treatment Plan Summary: Daily contact with patient to assess and evaluate symptoms and progress in treatment, Medication management and Plan schizoaffective disorder, undifferentiated  -Crisis stabilization -Medication management: Home medications restarted along with his gabapentin 600 mg BID for mood stabilization, Zoloft 150 mg daily for  depression, Zyprexa 15 mg at bedtime for psychosis and mood, Trazodone 100 mg at bedtime for sleep, Abilify injection monthly -Individual and substance abuse counseling -Order acute hepatitis panel due to severely elevated liver enzymes  Disposition: Supportive therapy provided about ongoing stressors.  Elmarie Shiley, NP-C 07/06/2015 15:30

## 2015-07-06 NOTE — ED Notes (Signed)
Per [employer] pt is brought to the ED d/t suicidal ideation.

## 2015-07-06 NOTE — Progress Notes (Signed)
Eye contact minimal.  Very drowsey

## 2015-07-06 NOTE — ED Notes (Signed)
Mental health specialist at bedside.

## 2015-07-06 NOTE — Clinical Social Work Note (Signed)
CSW met with pt upon his arrival to OBS unit. Pt states that he and wife recently separated and he subsequently moved in with his mother in [place]. He stated that prior to coming to the hospital, he was experiencing passive SI with thoughts of jumping off a bridge. Pt reports minimal alcohol use (about 1 40oz beer every few days). Pt reports that his biggest issue is cocaine abuse "I use as much as I can all day and have been doing that for a long time." Pt denies prior treatment for substance abuse and reports interest in long term treatment. Pt reports that he is now living in [place] but has no ID, making him ineligible for [facility]. Pt was provided with information to [facility] and verbalized understanding of provided info. He reports that he goes to [facility] for outpatient services. Pt denies SI/HI/AVH. Pt lethargic and asked to rest. Per Elmarie Shiley NP, pt to be reevaluated in the AM. She is concerned about pt's high liver enzymes.   Maxie Better, LCSWA Clinical Social Worker 07/06/2015 4:10 PM

## 2015-07-06 NOTE — ED Notes (Signed)
Up to the bathroom 

## 2015-07-06 NOTE — BH Assessment (Addendum)
Assessment Note  Duane Salazar is an 39 y.o. male who present to [facility] voluntarily by [facility]. Patient states that he does not know who called the police and he does not know who called the police, Patient states that he also does not remember where he was picked up from. Patient was alert but drowsy and nodded off several times during the assessment.  Patient states that he is suicidal "off and on" and states that he is not currently suicidal. He states that he has had thoughts in the past six months.  Patient denies HI and history of being violent towards others. Patient states that he has seen [name] in the past but states that his last appointment was "a while ago." Patient states that he has auditory and visual hallucinations of his family. Patient states "sometimes the voices, not my family tell me to punch people and stuff." Patient states that the voices do not tell him to kill himself or anyone else. Patient denies having AVH at this time.  Patient denies current SI/HI and AVH. Patient was discharged from [facility] on 06/30/2015.  Patient is alert and oriented to person and place but not date/time. Patient was nodding off during the assessment and had to be woken up every few questions by calling his name. Patient was dressed in scrubs and sat upright in the chair. Patient had a body odor and his eyes were red. Patient made poor eye contact and had slurred speech. Patient was cooperative and coherent when awake.  Patient  States that he uses THC, Heroin, and alcohol daily.  Patient was unable to specify many of his using habits. Patient states that he uses an unspecified amount of heroin daily and 40 ounces of alcohol daily.  Patient BAL + opiates, +cocaine, + THC, BAL 75.  Disposition pending face-to-face psychiatric consult.   Diagnosis: Polysubstance Abuse  Past Medical History:  Past Medical History  Diagnosis Date  . 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 Hypertension Dx 2002  . 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 Sleep apnea 1995    on CPAP  . Arthritis Dx 2002  . Diabetes type 2, controlled ([place]) 2010  . AKI (acute kidney injury) ([place]) 03/21/2015  . Schizophrenia ([place])   . Schizo affective schizophrenia ([place]) 1995  . Seizures ([place])   . Headache     Past Surgical History  Procedure Laterality Date  . Pituitary surgery  2005 & 2012    Family History:  Family History  Problem Relation Age of Onset  . Cancer Maternal Uncle   . Cancer Maternal Grandmother   . Heart disease Neg Hx   . Hypertension Mother   . Diabetes Mother     Social History:  reports that he has been smoking Cigarettes.  He has a 20 pack-year smoking history. He has never used smokeless tobacco. He reports that he drinks about 1.2 oz of alcohol per week. He reports that he uses illicit drugs (Cocaine and Marijuana).  Additional Social History:  Alcohol / Drug Use Pain Medications: See PTA Prescriptions: See PTA Over the Counter: See PTA History of alcohol / drug use?: Yes Substance #1 Name of Substance 1: THC 1 - Amount (size/oz): "a joint" 1 - Frequency: 2x/weekly 1 - Last Use / Amount: yesterday Substance #2 Name of Substance 2: Heroin 2 - Age of First Use: 38 2 - Frequency: Daily Substance #3 Name of Substance 3: Alcohol 3 - Age of First Use: 14 3 - Amount (size/oz): 40 ounce  3 - Frequency: daily 3 - Last Use / Amount: today  CIWA: CIWA-Ar BP: 152/92 mmHg Pulse Rate: 96 Nausea and Vomiting: no nausea and no vomiting Tactile Disturbances: none Tremor: no tremor Auditory Disturbances: not present Paroxysmal Sweats: no sweat visible Visual Disturbances: not present Anxiety: no anxiety, at ease Headache, Fullness in Head: none present Agitation: normal activity Orientation and Clouding of Sensorium: cannot do serial additions or is uncertain about date CIWA-Ar Total: 1 COWS:    Allergies: No Known Allergies  Home Medications:  (Not in a hospital admission)  OB/GYN Status:  No LMP  for male patient.  General Assessment Data [facility] Assessment: In system Is this a Tele or Face-to-Face Assessment?: Face-to-Face Is this an Initial Assessment or a Re-assessment for this encounter?: Initial Assessment Marital status: Separated (9 months) Is patient pregnant?: No Pregnancy Status: No Living Arrangements: Parent Can pt return to current living arrangement?: Yes Admission Status: Voluntary Is patient capable of signing voluntary admission?: Yes Referral Source: Self/Family/Friend     Crisis Care Plan Living Arrangements: Parent     Risk to self with the past 6 months Suicidal Ideation: No Has patient been a risk to self within the past 6 months prior to admission? : Yes Suicidal Intent: No Has patient had any suicidal intent within the past 6 months prior to admission? : No Is patient at risk for suicide?: No Suicidal Plan?: No Has patient had any suicidal plan within the past 6 months prior to admission? : No Access to Means: No Previous Attempts/Gestures: No Other Self Harm Risks: Denies Triggers for Past Attempts: None known Intentional Self Injurious Behavior: None Recent stressful life event(s): Conflict (Comment), Divorce ("family and wife leaving") Depression: No Depression Symptoms: Insomnia, Tearfulness, Isolating Substance abuse history and/or treatment for substance abuse?: Yes Suicide prevention information given to non-admitted patients: Not applicable  Risk to Others within the past 6 months Homicidal Ideation: No Does patient have any lifetime risk of violence toward others beyond the six months prior to admission? : No Thoughts of Harm to Others: No Current Homicidal Intent: No Current Homicidal Plan: No Access to Homicidal Means: No History of harm to others?: No Assessment of Violence: None Noted Does patient have access to weapons?: No Criminal Charges Pending?: No Does patient have a court date: No Is  patient on probation?: No  Psychosis Hallucinations: Auditory, Visual (family "hit somebody or something") Delusions: None noted  Mental Status Report Motor Activity: Freedom of movement  Cognitive Functioning Appetite: Fair Sleep: Decreased Total Hours of Sleep: 2 Vegetative Symptoms: None  ADLScreening [facility] Assessment Services) Patient's cognitive ability adequate to safely complete daily activities?: Yes Patient able to express need for assistance with ADLs?: Yes Independently performs ADLs?: Yes (appropriate for developmental age)  Prior Inpatient Therapy Prior Inpatient Therapy: Yes Prior Therapy Dates: 2016 Reason for Treatment: Depression  Prior Outpatient Therapy Prior Outpatient Therapy: Yes Does patient have an ACCT team?: No Does patient have Intensive In-House Services?  : No Does patient have [name] services? : Yes Does patient have [facility] services?: No  ADL Screening (condition at time of admission) Patient's cognitive ability adequate to safely complete daily activities?: Yes Is the patient deaf or have difficulty hearing?: No Does the patient have difficulty seeing, even when wearing glasses/contacts?: No Does the patient have difficulty concentrating, remembering, or making decisions?: No Patient able to express need for assistance with ADLs?: Yes Does the patient have difficulty dressing or bathing?: No Independently performs  ADLs?: Yes (appropriate for developmental age) Does the patient have difficulty walking or climbing stairs?: No Weakness of Legs: None Weakness of Arms/Hands: None  Home Assistive Devices/Equipment Home Assistive Devices/Equipment: None  Therapy Consults (therapy consults require a physician order) PT Evaluation Needed: No SLP Evaluation Needed: No Abuse/Neglect Assessment (Assessment to be complete while patient is alone) Physical Abuse: Denies Verbal Abuse: Denies Sexual Abuse: Denies Exploitation of patient/patient's  resources: Denies Self-Neglect: Denies Values / Beliefs Cultural Requests During Hospitalization: None Spiritual Requests During Hospitalization: None Consults Spiritual Care Consult Needed: No Social Work Consult Needed: No Regulatory affairs officer (For Healthcare) Does patient have an advance directive?: No Would patient like information on creating an advanced directive?: No - patient declined information    Additional Information 1:1 In Past 12 Months?: No CIRT Risk: No Elopement Risk: No Does patient have medical clearance?: Yes     Disposition:  Disposition Initial Assessment Completed for this Encounter: Yes  On Site Evaluation by:   Reviewed with Physician:    Tirrell Buchberger 07/06/2015 7:33 AM

## 2015-07-06 NOTE — ED Notes (Signed)
tts into see 

## 2015-07-06 NOTE — ED Notes (Signed)
Pt ambulatory w/o difficulty from TCU 

## 2015-07-06 NOTE — ED Notes (Signed)
Sleeping, easily aroused. Pt denies si/hi/avh at this time.  Pt reports that he drinks ETOH on a daily basis,but does not know how much.  Pt also reports that he uses heroin and pot.  Pt reports that he has never been thru detox before.

## 2015-07-06 NOTE — Consult Note (Signed)
[facility]iat[facility] Consult   Reason for Consult:  Polysubstance abuse with passive suicidal ideation, intermittent Referring Physician:  EDP Patient Identification: Duane Salazar MRN:  [id number] Principal Diagnosis: Schizoaffective disorder, unspecified ([place]) Diagnosis:   Patient Active Problem List   Diagnosis Date Noted  . Schizoaffective disorder, unspecified ([place]) [F25.9] 07/06/2015    Priority: High  . Polysubstance dependence including opioid type drug, continuous use ([place]) [F11.20, F19.20] 07/06/2015    Priority: High  . Schizoaffective disorder ([place]) [F25.9] 06/27/2015  . Syncope and collapse [R55] 03/21/2015  . AKI (acute kidney injury) ([place]) [N17.9] 03/21/2015  . Hypokalemia [E87.6] 03/21/2015  . Hypotension [I95.9] 03/21/2015  . 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 Arterial hypotension [I95.9]   . Laceration of forehead [S01.81XA]   . Faintness [R55] 03/11/2015  . Chronic pain syndrome [G89.4] 03/11/2015  . Pituitary macroadenoma ([place]) [D35.2] 03/11/2015  . Right Achilles tendinitis [M76.61] 01/02/2015  . Chronic headache [R51] 01/02/2015  . Right maxillary sinusitis, chronic [J32.0] 01/02/2015  . 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 Syncope [R55] 01/02/2015  . Onychomycosis of toenail [B35.1] 01/02/2015  . HTN (hypertension) [I10] 10/21/2014  . Depression [F32.9] 10/21/2014  . Morbid obesity with BMI of 40.0-44.9, adult ([place]) [E66.01, Z68.41] 10/21/2014  . Sleep apnea [G47.30]   . 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[place]) [E22.0]   . Arthritis [M19.90]   . DM2 (diabetes mellitus, type 2) ([place]) [E11.9]     Total Time spent with patient: 45 minutes  Subjective:   Duane Salazar is a 39 y.o. male patient admitted to [facility] for stabilization.  HPI:  39 yo male presented to the ED after using heroin, cocaine, marijuana, and alcohol with intermittent, passive suicidal ideations.  History of schizoaffective disorder.   Patient discharged from [facility] last Monday, 10/31.  On assessment, he denies suicidal/homicidal ideations, hallucinations, and withdrawal symptoms.  He also did not want to go to detox or rehab.  However, when the psychiatrist asked if he was ready to go home, he quickly said no and he needed help of his mental health.  He had been living with his mother and does not want to return.  Duane Salazar has been on a drug binge the past week and did not attend his follow-up appointment or take his medications.  Past Psychiatric History: schizoaffective disorder  Risk to Self: Suicidal Ideation: No Suicidal Intent: No Is patient at risk for suicide?: No Suicidal Plan?: No Specify Current Suicidal Plan: Denies Access to Means: No Specify Access to Suicidal Means: Denies What has been your use of drugs/alcohol within the last 12 months?: THC, Alcohol, Heroin How many times?: 0 Other Self Harm Risks: Denies Triggers for Past Attempts: None known Intentional Self Injurious Behavior: None Risk to Others: Homicidal Ideation: No Thoughts of Harm to Others: No Current Homicidal Intent: No Current Homicidal Plan: No Access to Homicidal Means: No Identified Victim: None reported History of harm to others?: No Assessment of Violence: None Noted Violent Behavior Description: Denies Does patient have access to weapons?: No Criminal Charges Pending?: No Describe Pending Criminal Charges: N/A Does patient have a court date: No Prior Inpatient Therapy: Prior Inpatient Therapy: Yes Prior Therapy Dates: 2016 Prior Therapy Facilty/Provider(s): [facility] Reason for Treatment: Depression Prior Outpatient Therapy: Prior Outpatient Therapy: Yes Prior Therapy Dates: [facility] Prior Therapy Facilty/Provider(s): [facility] Reason for Treatment: [facility] Does patient have an ACCT team?: No Does patient have Intensive In-House Services?  : No Does patient have [name] services? :  Yes Does patient have [facility]  services?: No  Past Medical History:  Past Medical History  Diagnosis Date  . 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 Hypertension Dx 2002  . 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[place]) 2004  . Sleep apnea 1995    on CPAP  . Arthritis Dx 2002  . Diabetes type 2, controlled ([place]) 2010  . AKI (acute kidney injury) ([place]) 03/21/2015  . Schizophrenia ([place])   . Schizo affective schizophrenia ([place]) 1995  . Seizures ([place])   . Headache     Past Surgical History  Procedure Laterality Date  . Pituitary surgery  2005 & 2012   Family History:  Family History  Problem Relation Age of Onset  . Cancer Maternal Uncle   . Cancer Maternal Grandmother   . Heart disease Neg Hx   . Hypertension Mother   . Diabetes Mother    Family Psychiatric  History: None Social History:  History  Alcohol Use  . 1.2 oz/week  . 2 Cans of beer, 0 Standard drinks or equivalent per week    Comment: 2 40s a day     History  Drug Use  . Yes  . Special: Cocaine, Marijuana    Comment: Reports he has not used recently    Social History   Social History  . Marital Status: Married    Spouse Name: N/A  . Number of Children: 2  . Years of Education: GED   Social History Main Topics  . Smoking status: Current Every Day Smoker -- 1.00 packs/day for 20 years    Types: Cigarettes  . Smokeless tobacco: Never Used     Comment: Smoking .5 ppd  . Alcohol Use: 1.2 oz/week    2 Cans of beer, 0 Standard drinks or equivalent per week     Comment: 2 40s a day  . Drug Use: Yes    Special: Cocaine, Marijuana     Comment: Reports he has not used recently  . Sexual Activity: Yes   Other Topics Concern  . None   Social History Narrative   Lives with mom.   Incarcerated for 22 months in [place], [place]. From 2014-09/2014   Additional Social History:    Pain Medications: See PTA Prescriptions: See PTA Over the Counter: See PTA History of alcohol / drug use?: Yes Name of Substance 1: THC 1 - Amount (size/oz): "a joint" 1 - Frequency:  2x/weekly 1 - Last Use / Amount: yesterday Name of Substance 2: Heroin 2 - Age of First Use: 38 2 - Frequency: Daily Name of Substance 3: Alcohol 3 - Age of First Use: 14 3 - Amount (size/oz): 40 ounce 3 - Frequency: daily 3 - Last Use / Amount: today               Allergies:  No Known Allergies  Labs:  Results for orders placed or performed during the hospital encounter of 07/06/15 (from the past 48 hour(s))  Comprehensive metabolic panel     Status: Abnormal   Collection Time: 07/06/15  5:36 AM  Result Value Ref Range   Sodium 134 (L) 135 - 145 mmol/L   Potassium 3.9 3.5 - 5.1 mmol/L   Chloride 102 101 - 111 mmol/L   CO2 21 (L) 22 - 32 mmol/L   Glucose, Bld 152 (H) 65 - 99 mg/dL   BUN 12 6 - 20 mg/dL   Creatinine, Ser 0.79 0.61 - 1.24 mg/dL   Calcium 9.0 8.9 - 10.3 mg/dL   Total Protein 7.2 6.5 -  8.1 g/dL   Albumin 4.2 3.5 - 5.0 g/dL   AST 510 (H) 15 - 41 U/L   ALT 632 (H) 17 - 63 U/L   Alkaline Phosphatase 150 (H) 38 - 126 U/L   Total Bilirubin 3.2 (H) 0.3 - 1.2 mg/dL   GFR calc non Af Amer >60 >60 mL/min   GFR calc Af Amer >60 >60 mL/min    Comment: (NOTE) The eGFR has been calculated using the CKD EPI equation. This calculation has not been validated in all clinical situations. eGFR's persistently <60 mL/min signify possible Chronic Kidney Disease.    Anion gap 11 5 - 15  Ethanol (ETOH)     Status: Abnormal   Collection Time: 07/06/15  5:36 AM  Result Value Ref Range   Alcohol, Ethyl (B) 75 (H) <5 mg/dL    Comment:        LOWEST DETECTABLE LIMIT FOR SERUM ALCOHOL IS 5 mg/dL FOR MEDICAL PURPOSES ONLY   Salicylate level     Status: None   Collection Time: 07/06/15  5:36 AM  Result Value Ref Range   Salicylate Lvl <2.9 2.8 - 30.0 mg/dL  Acetaminophen level     Status: Abnormal   Collection Time: 07/06/15  5:36 AM  Result Value Ref Range   Acetaminophen (Tylenol), Serum <10 (L) 10 - 30 ug/mL    Comment:        THERAPEUTIC CONCENTRATIONS  VARY SIGNIFICANTLY. A RANGE OF 10-30 ug/mL MAY BE AN EFFECTIVE CONCENTRATION FOR MANY PATIENTS. HOWEVER, SOME ARE BEST TREATED AT CONCENTRATIONS OUTSIDE THIS RANGE. ACETAMINOPHEN CONCENTRATIONS >150 ug/mL AT 4 HOURS AFTER INGESTION AND >50 ug/mL AT 12 HOURS AFTER INGESTION ARE OFTEN ASSOCIATED WITH TOXIC REACTIONS.   CBC     Status: Abnormal   Collection Time: 07/06/15  5:36 AM  Result Value Ref Range   WBC 6.3 4.0 - 10.5 K/uL   RBC 6.38 (H) 4.22 - 5.81 MIL/uL   Hemoglobin 15.0 13.0 - 17.0 g/dL   HCT 44.3 39.0 - 52.0 %   MCV 69.4 (L) 78.0 - 100.0 fL   MCH 23.5 (L) 26.0 - 34.0 pg   MCHC 33.9 30.0 - 36.0 g/dL   RDW 16.7 (H) 11.5 - 15.5 %   Platelets 180 150 - 400 K/uL  Urine rapid drug screen (hosp performed) (Not at [facility])     Status: Abnormal   Collection Time: 07/06/15  6:34 AM  Result Value Ref Range   Opiates POSITIVE (A) NONE DETECTED   Cocaine POSITIVE (A) NONE DETECTED   Benzodiazepines NONE DETECTED NONE DETECTED   Amphetamines NONE DETECTED NONE DETECTED   Tetrahydrocannabinol POSITIVE (A) NONE DETECTED   Barbiturates NONE DETECTED NONE DETECTED    Comment:        DRUG SCREEN FOR MEDICAL PURPOSES ONLY.  IF CONFIRMATION IS NEEDED FOR ANY PURPOSE, NOTIFY LAB WITHIN 5 DAYS.        LOWEST DETECTABLE LIMITS FOR URINE DRUG SCREEN Drug Class       Cutoff (ng/mL) Amphetamine      1000 Barbiturate      200 Benzodiazepine   518 Tricyclics       841 Opiates          300 Cocaine          300 THC              50   Ammonia     Status: Abnormal   Collection Time: 07/06/15  7:37 AM  Result Value Ref Range  Ammonia 54 (H) 9 - 35 umol/L    Current Facility-Administered Medications  Medication Dose Route Frequency Provider Last Rate Last Dose  . [START ON 07/25/2015] ARIPiprazole SUSR 400 mg  400 mg Intramuscular Q30 days Davonna Belling, MD      . atorvastatin (LIPITOR) tablet 40 mg  40 mg Oral Daily Davonna Belling, MD   40 mg at 07/06/15 1028  . cloNIDine  (CATAPRES) tablet 0.2 mg  0.2 mg Oral BID Davonna Belling, MD   0.2 mg at 07/06/15 1029  . gabapentin (NEURONTIN) capsule 600 mg  600 mg Oral TID Davonna Belling, MD   600 mg at 07/06/15 1028  . lactulose (CHRONULAC) 10 GM/15ML solution 20 g  20 g Oral TID Patrecia Pour, NP      . LORazepam (ATIVAN) tablet 0-4 mg  0-4 mg Oral 4 times per day Davonna Belling, MD   0 mg at 07/06/15 0725   Followed by  . [START ON 07/08/2015] LORazepam (ATIVAN) tablet 0-4 mg  0-4 mg Oral Q12H Davonna Belling, MD      . losartan (COZAAR) tablet 50 mg  50 mg Oral Daily Davonna Belling, MD   50 mg at 07/06/15 1029  . metFORMIN (GLUCOPHAGE) tablet 500 mg  500 mg Oral BID PC Davonna Belling, MD   500 mg at 07/06/15 1028  . OLANZapine (ZYPREXA) tablet 15 mg  15 mg Oral QHS Davonna Belling, MD      . predniSONE (DELTASONE) tablet 7.5 mg  7.5 mg Oral Q breakfast Davonna Belling, MD   7.5 mg at 07/06/15 1028  . sertraline (ZOLOFT) tablet 150 mg  150 mg Oral Daily Davonna Belling, MD   150 mg at 07/06/15 1029  . thiamine (VITAMIN B-1) tablet 100 mg  100 mg Oral Daily Davonna Belling, MD   100 mg at 07/06/15 1029   Or  . thiamine (B-1) injection 100 mg  100 mg Intravenous Daily Davonna Belling, MD      . traZODone (DESYREL) tablet 100 mg  100 mg Oral QHS Davonna Belling, MD      . verapamil (CALAN-SR) CR tablet 240 mg  240 mg Oral Daily Davonna Belling, MD   240 mg at 07/06/15 1029   Current Outpatient Prescriptions  Medication Sig Dispense Refill  . [START ON 07/25/2015] ARIPiprazole (ABILIFY MAINTENA) 400 MG SUSR Inject 400 mg into the muscle every 30 (thirty) days. 1 each   . atorvastatin (LIPITOR) 40 MG tablet Take 1 tablet (40 mg total) by mouth daily. 7 tablet 0  . cloNIDine (CATAPRES) 0.2 MG tablet Take 1 tablet (0.2 mg total) by mouth 2 (two) times daily. 14 tablet 0  . gabapentin (NEURONTIN) 300 MG capsule Take 2 capsules (600 mg total) by mouth 3 (three) times daily. 42 capsule 0  . losartan (COZAAR)  50 MG tablet Take 50 mg by mouth daily.  3  . metFORMIN (GLUCOPHAGE) 500 MG tablet Take 1 tablet (500 mg total) by mouth 2 (two) times daily after a meal. 14 tablet 0  . MOBIC 15 MG tablet Take 15 mg by mouth daily as needed. For inflammation/pain.  1  . OLANZapine (ZYPREXA) 15 MG tablet Take 1 tablet (15 mg total) by mouth at bedtime. 7 tablet 0  . predniSONE (DELTASONE) 2.5 MG tablet Take 3 tablets (7.5 mg total) by mouth daily with breakfast. 21 tablet 0  . sertraline (ZOLOFT) 50 MG tablet Take 3 tablets (150 mg total) by mouth daily. 21 tablet 0  . traZODone (  DESYREL) 100 MG tablet Take 1 tablet (100 mg total) by mouth at bedtime. 7 tablet 0  . verapamil (CALAN-SR) 240 MG CR tablet Take 1 tablet (240 mg total) by mouth daily. 7 tablet 0    Musculoskeletal: Strength & Muscle Tone: within normal limits Gait & Station: normal Patient leans: N/A  Psychiatric Specialty Exam: Review of Systems  Constitutional: Negative.   HENT: Negative.   Eyes: Negative.   Respiratory: Negative.   Cardiovascular: Negative.   Gastrointestinal: Negative.   Genitourinary: Negative.   Musculoskeletal: Negative.   Skin: Negative.   Neurological: Negative.   Endo/Heme/Allergies: Negative.   Psychiatric/Behavioral: Positive for substance abuse.    Blood pressure 151/89, pulse 82, temperature 98.1 F (36.7 C), temperature source Oral, resp. rate 16, height '6\' 3"'  (1.905 m), weight 136.079 kg (300 lb), SpO2 96 %.Body mass index is 37.5 kg/(m^2).  General Appearance: Disheveled  Eye Sport and exercise psychologist::  Fair  Speech:  Normal Rate  Volume:  Normal  Mood:  Irritable  Affect:  Blunt  Thought Process:  Coherent  Orientation:  Full (Time, Place, and Person)  Thought Content:  WDL  Suicidal Thoughts:  Yes.  without intent/plan  Homicidal Thoughts:  No  Memory:  Immediate;   Fair Recent;   Fair Remote;   Fair  Judgement:  Fair  Insight:  Fair  Psychomotor Activity:  Decreased  Concentration:  Fair  Recall:  [employer] of Knowledge:Fair  Language: Fair  Akathisia:  No  Handed:  Right  AIMS (if indicated):     Assets:  Leisure Time Physical Health Resilience Social Support  ADL's:  Intact  Cognition: WNL  Sleep:      Treatment Plan Summary: Daily contact with patient to assess and evaluate symptoms and progress in treatment, Medication management and Plan schizoaffective disorder, undifferentiated  -Crisis stabilization -Medication management:  Home medications restarted along with his gabapentin 600 mg BID for mood stabilization, Zoloft 150 mg daily for depression, Zyprexa 15 mg at bedtime for psychosis and mood, Trazodone 100 mg at bedtime for sleep, Abilify injection monthly -Individual and substance abuse counseling  Disposition: Supportive therapy provided about ongoing stressors.  Duane Salazar, [place] 07/06/2015 10:44 AM Patient seen face-to-face for psychiatric evaluation, chart reviewed and case discussed with the physician extender and developed treatment plan. Reviewed the information documented and agree with the treatment plan. Corena Pilgrim, MD

## 2015-07-06 NOTE — Progress Notes (Signed)
Nursing admit note- Patient admitted for observation following medical clearance from [facility]. Chief c/o depression r/t multiple family issues and polysubstance use.  Denies current SI but states he desires inpatient treatment.  Reports non-compliance with home meds "for a while".  Last alcohol use yesterday and a "siff" of herion 3 days ago.  Please refer to alcohol screen.  Presents depressed and disheveled with body odor.  Food/fluids given.  Escorted to obs unit where he is resting with eyes closed and resp even and unlabored.  Provider contacted for medicaion orders.

## 2015-07-06 NOTE — BHH Counselor (Signed)
This writer was able to speak with patient briefly. This Probation officer introduced self and discussed what pt. Plans are upon d/c Patient stated that he wanted to go to in-patient residential treatment on [place] as his primary choice of follow-up/ continued care. This Probation officer contacted [facility] residential, but was only able to confirm that pt . May meet criteria for treatment even w/o identification card on person, and that there may be some arrangements possible for the pt., but will have to call in a.m. During open hours for intake after 8 a.m.Marland Kitchen Furthermore patient did express that he would prefer long-term treatment residential as an option to help with his recovery.    This Probation officer explored further resources for pt. And made print outs which can be discussed with pt. In a.m. When he is alert and oriented/ willing to discuss follow up care/ plans which will involve [facility] as possibilities for placement. Print out placed in pt. File in OBS. Shaneeka Scarboro K. Nash Shearer, LPC-A, [facility] Counselor 07/06/2015 10:23 PM

## 2015-07-06 NOTE — ED Notes (Addendum)
Resting quietly with eye closed. Easily arousable. Verbally responsive. Resp even and unlabored. ABC's intact. No behavior problems noted. NAD noted. Pt close to NS. Pt and personal items have been wanded via security.

## 2015-07-06 NOTE — BHH Counselor (Signed)
Patient has been accepted to Observation bed 5 per Debarah Crape, RN, [facility]. Patient completed voluntary paperwork and faxed it to [facility]. Patient declined ROI and signed it. Patient denies any questions or concerns at this time. Patients information provided to the nurse for transportation.  Rosalin Hawking, LCSW Therapeutic Triage Specialist [place] 07/06/2015 11:34 AM

## 2015-07-07 DIAGNOSIS — R45851 Suicidal ideations: Secondary | ICD-10-CM | POA: Diagnosis not present

## 2015-07-07 DIAGNOSIS — F1920 Other psychoactive substance dependence, uncomplicated: Secondary | ICD-10-CM | POA: Diagnosis not present

## 2015-07-07 DIAGNOSIS — F251 Schizoaffective disorder, depressive type: Secondary | ICD-10-CM | POA: Insufficient documentation

## 2015-07-07 LAB — HEPATITIS PANEL, ACUTE
HCV Ab: >11 s/co ratio — ABNORMAL HIGH (ref 0.0–0.9)
Hep A IgM: NEGATIVE
Hep B C IgM: NEGATIVE
Hepatitis B Surface Ag: NEGATIVE

## 2015-07-07 LAB — GLUCOSE, CAPILLARY: Glucose-Capillary: 155 mg/dL — ABNORMAL HIGH (ref 65–99)

## 2015-07-07 NOTE — Progress Notes (Signed)
BHH-Observation Unit Progress Note  07/07/2015 2:25 PM Duane Salazar  MRN:  [id number] Subjective:   Patient states "I am still very suicidal. I have plans to jump off a bridge. I know I will relapse again if I leave here. I want to get help. I still feel unstable. I took the medicines for a while but started missing doses when I was using drugs."   Objective:   Patient seen and chart is reviewed. On admission his urine drug screen was positive for opiates, cocaine, and marijuana. Previous notes in epic indicate that the patient was not compliant with follow up or medications after being released from [place] [facility] on 06/30/2015. He left the hospital and began to heavily use drugs after discharge, which he agrees worsens his mental health symptoms. Patient has continued to endorse suicidal thoughts today and yesterday with plan. He continues to report minimal amounts of daily alcohol use but this is a likely explanation for his elevated liver enzymes. His alcohol level was 75 on admission.   Principal Problem: Polysubstance dependence including opioid type drug, continuous use ([place]) Diagnosis:   Patient Active Problem List   Diagnosis Date Noted  . Schizoaffective disorder, unspecified ([place]) [F25.9] 07/06/2015  . Polysubstance dependence including opioid type drug, continuous use ([place]) [F11.20, F19.20] 07/06/2015  . Schizoaffective disorder ([place]) [F25.9] 06/27/2015  . Syncope and collapse [R55] 03/21/2015  . AKI (acute kidney injury) ([place]) [N17.9] 03/21/2015  . Hypokalemia [E87.6] 03/21/2015  . Hypotension [I95.9] 03/21/2015  . 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 Arterial hypotension [I95.9]   . Laceration of forehead [S01.81XA]   . Faintness [R55] 03/11/2015  . Chronic pain syndrome [G89.4] 03/11/2015  . 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 Right Achilles tendinitis [M76.61] 01/02/2015  . Chronic headache [R51] 01/02/2015  . Right  maxillary sinusitis, chronic [J32.0] 01/02/2015  . 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 Syncope [R55] 01/02/2015  . Onychomycosis of toenail [B35.1] 01/02/2015  . HTN (hypertension) [I10] 10/21/2014  . Depression [F32.9] 10/21/2014  . Morbid obesity with BMI of 40.0-44.9, adult ([place]) [E66.01, Z68.41] 10/21/2014  . Sleep apnea [G47.30]   . 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 Arthritis [M19.90]   . DM2 (diabetes mellitus, type 2) (HCC) [E11.9]    Total Time spent with patient: 30 minutes  Past Medical History:  Past Medical History  Diagnosis Date  . 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 Hypertension Dx 2002  . 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 Sleep apnea 1995    on CPAP  . Arthritis Dx 2002  . Diabetes type 2, controlled ([place]) 2010  . AKI (acute kidney injury) ([place]) 03/21/2015  . Schizophrenia ([place])   . Schizo affective schizophrenia ([place]) 1995  . Seizures ([place])   . Headache     Past Surgical History  Procedure Laterality Date  . Pituitary surgery  2005 & 2012   Family History:  Family History  Problem Relation Age of Onset  . Cancer Maternal Uncle   . Cancer Maternal Grandmother   . Heart disease Neg Hx   . Hypertension Mother   . Diabetes Mother    Social History:  History  Alcohol Use  . 1.2 oz/week  . 2 Cans of beer, 0 Standard drinks or equivalent per week    Comment: 2 40s a day     History  Drug Use  . Yes  . Special: Cocaine, Marijuana    Comment: Reports he has not used  recently    Social History   Social History  . Marital Status: Married    Spouse Name: N/A  . Number of Children: 2  . Years of Education: GED   Social History Main Topics  . Smoking status: Current Every Day Smoker -- 1.00 packs/day for 20 years    Types: Cigarettes  . Smokeless tobacco: Never Used     Comment: Smoking .5 ppd  . Alcohol Use: 1.2 oz/week    2 Cans of beer, 0 Standard drinks or equivalent per week     Comment: 2 40s a day  . Drug Use: Yes     Special: Cocaine, Marijuana     Comment: Reports he has not used recently  . Sexual Activity: Yes   Other Topics Concern  . None   Social History Narrative   Lives with mom.   Incarcerated for 22 months in [place], [place]. From 2014-09/2014   Additional Social History:                         Sleep: Fair  Appetite:  Good  Current Medications: Current Facility-Administered Medications  Medication Dose Route Frequency Provider Last Rate Last Dose  . acetaminophen (TYLENOL) tablet 650 mg  650 mg Oral Q6H PRN Niel Hummer, NP      . alum & mag hydroxide-simeth (MAALOX/MYLANTA) 200-200-20 MG/5ML suspension 30 mL  30 mL Oral Q4H PRN Niel Hummer, NP      . Derrill Memo ON 07/25/2015] ARIPiprazole SUSR 400 mg  400 mg Intramuscular Q30 days Niel Hummer, NP      . atorvastatin (LIPITOR) tablet 40 mg  40 mg Oral Daily Niel Hummer, NP   40 mg at 07/07/15 0839  . gabapentin (NEURONTIN) capsule 600 mg  600 mg Oral TID Niel Hummer, NP   600 mg at 07/07/15 1301  . magnesium hydroxide (MILK OF MAGNESIA) suspension 30 mL  30 mL Oral Daily PRN Niel Hummer, NP      . metFORMIN (GLUCOPHAGE) tablet 500 mg  500 mg Oral BID PC Niel Hummer, NP   500 mg at 07/07/15 0840  . OLANZapine (ZYPREXA) tablet 15 mg  15 mg Oral QHS Niel Hummer, NP   15 mg at 07/06/15 2114  . sertraline (ZOLOFT) tablet 150 mg  150 mg Oral Daily Niel Hummer, NP   150 mg at 07/07/15 0839  . traZODone (DESYREL) tablet 100 mg  100 mg Oral QHS Niel Hummer, NP   100 mg at 07/06/15 2114  . verapamil (CALAN-SR) CR tablet 120 mg  120 mg Oral Daily Niel Hummer, NP   120 mg at 07/07/15 6213    Lab Results:  Results for orders placed or performed during the hospital encounter of 07/06/15 (from the past 48 hour(s))  Glucose, capillary     Status: Abnormal   Collection Time: 07/07/15  7:06 AM  Result Value Ref Range   Glucose-Capillary 155 (H) 65 - 99 mg/dL    Physical Findings: AIMS: Facial and Oral  Movements Muscles of Facial Expression: None, normal Lips and Perioral Area: None, normal Jaw: None, normal Tongue: None, normal,Extremity Movements Upper (arms, wrists, hands, fingers): None, normal Lower (legs, knees, ankles, toes): None, normal, Trunk Movements Neck, shoulders, hips: None, normal, Overall Severity Severity of abnormal movements (highest score from questions above): None, normal Incapacitation due to abnormal movements: None, normal Patient's awareness of abnormal movements (rate only patient's report):  No Awareness, Dental Status Current problems with teeth and/or dentures?: Yes (multiple missing teeth) Does patient usually wear dentures?: No  CIWA:  CIWA-Ar Total: 0 COWS:     Musculoskeletal: Strength & Muscle Tone: within normal limits Gait & Station: normal Patient leans: N/A  Psychiatric Specialty Exam: Review of Systems  Constitutional: Negative.   HENT: Negative.   Eyes: Negative.   Respiratory: Negative.   Cardiovascular: Negative.   Gastrointestinal: Negative.   Genitourinary: Negative.   Musculoskeletal: Negative.   Skin: Negative.   Neurological: Negative.   Endo/Heme/Allergies: Negative.   Psychiatric/Behavioral: Positive for depression, suicidal ideas and substance abuse. Negative for hallucinations and memory loss. The patient is not nervous/anxious and does not have insomnia.     Blood pressure 115/70, pulse 65, temperature 97.9 F (36.6 C), temperature source Oral, resp. rate 16, height 6\' 3"  (1.905 m), weight 129.275 kg (285 lb), SpO2 96 %.Body mass index is 35.62 kg/(m^2).  General Appearance: Disheveled  Eye Sport and exercise psychologist::  Fair  Speech:  Clear and Coherent  Volume:  Normal  Mood:  Depressed  Affect:  Restricted  Thought Process:  Goal Directed and Intact  Orientation:  Full (Time, Place, and Person)  Thought Content:  Concerns about relapsing  Suicidal Thoughts:  Yes.  with intent/plan  Homicidal Thoughts:  No  Memory:  Immediate;    Good Recent;   Fair Remote;   Fair  Judgement:  Poor  Insight:  Lacking  Psychomotor Activity:  Decreased  Concentration:  Good  Recall:  Duane Salazar of Knowledge:Good  Language: Good  Akathisia:  No  Handed:  Right  AIMS (if indicated):     Assets:  Communication Skills Desire for Improvement Leisure Time Resilience Social Support  ADL's:  Intact  Cognition: WNL  Sleep:      Treatment Plan Summary: Daily contact with patient to assess and evaluate symptoms and progress in treatment and Medication management  -Continue to observe in the [place] [facility] due to continued suicidal ideation. -Education officer, museum contacted patient's case manager who can pick him up tomorrow for morning for transportation to [facility] in the am for substance abuse treatment.  -Results of acute hepatitis panel are currently pending  -Continue daily CBG due to history of Diabetes with reading of 155 this morning  Loc Feinstein, NP-C 07/07/2015, 2:25 PM

## 2015-07-07 NOTE — Progress Notes (Signed)
Patient ID: Duane Salazar, male   DOB: February 03, 1976, 39 y.o.   MRN: [id number]  At am med pass stated he is still suicidal, and has plans to jump off bridge.  He would like to go into a treatment program after he leaves here. He is pleasant and appropriate. Slept much of the am. Ate well at meal time. He denies any thoughts to hurt others. He did call someone, but states only support is his mom.

## 2015-07-07 NOTE — BHH Counselor (Signed)
[place] Assessment Progress Note  Pt has a case mgr with [facility] PATH program, [place] [phone]). Counselor called [place], w/ pt's permission, to coordinate pt disposition. [place] shared that pt has a screening appt with [facility] on 11/10 at 8am. Counselor explained that pt would not be allowed to stay in OBS until his appt. [place] indicated that if pt could stay the night, she would be able to pick him up in the AM (around 9am) and take him to [facility] to see if he could get a bed there (counselor called [facility] earlier and they indicated they may have beds in the AM). [place] requested that counselor send referral information to [facility] to make for an easier screening appt if pt doesn't get into [facility].   Pt consulted with Elmarie Shiley, NP, and pt, about tentative d/c plan and they were both in agreement with the plan.  Pt called [facility] [phone]) and ver'd that pt does have a screening on 11/10 at 8am. Counselor will fax referral information to attn of Hoyle Sauer [phone]).   Kenna Gilbert. Lovena Le, [place], [place], [facility] Counselor

## 2015-07-07 NOTE — Progress Notes (Signed)
Patient ID: Duane Salazar, male   DOB: November 20, 1975, 39 y.o.   MRN: [id number] D-at first contact with writer this am, offered he was suicidal with thoughts to jump off a bridge. He states he feels he needs treatment after his stay here in OBS. He is soft spoken and pleasant. He has slept most of shift except for meals and when writer interacts with him for meds. A-Seen by NP Mickel Baas and social worker to coordinate treatment for him once discharged from here. He has a Interior and spatial designer at [facility] and she reached out to counselor here.Support offered monitored for safety and medications as ordered R-Plan for him to stay tonight and leave in am at 9am with [facility] staff person for an evaluation at [facility]. He is in agreement with this plan.

## 2015-07-08 ENCOUNTER — Telehealth (HOSPITAL_BASED_OUTPATIENT_CLINIC_OR_DEPARTMENT_OTHER): Payer: Self-pay | Admitting: Emergency Medicine

## 2015-07-08 DIAGNOSIS — F251 Schizoaffective disorder, depressive type: Secondary | ICD-10-CM | POA: Diagnosis not present

## 2015-07-08 DIAGNOSIS — F1920 Other psychoactive substance dependence, uncomplicated: Secondary | ICD-10-CM | POA: Diagnosis not present

## 2015-07-08 DIAGNOSIS — F1120 Opioid dependence, uncomplicated: Secondary | ICD-10-CM | POA: Diagnosis not present

## 2015-07-08 LAB — HEPATITIS PANEL, ACUTE
HCV Ab: >11 s/co ratio — ABNORMAL HIGH (ref 0.0–0.9)
Hep A IgM: NEGATIVE
Hep B C IgM: NEGATIVE
Hepatitis B Surface Ag: NEGATIVE

## 2015-07-08 LAB — GLUCOSE, CAPILLARY: Glucose-Capillary: 108 mg/dL — ABNORMAL HIGH (ref 65–99)

## 2015-07-08 MED ORDER — ATORVASTATIN CALCIUM 40 MG PO TABS: 40.0000 mg | ORAL_TABLET | Freq: Every day | ORAL | Status: DC

## 2015-07-08 MED ORDER — GABAPENTIN 300 MG PO CAPS: 600.0000 mg | ORAL_CAPSULE | Freq: Three times a day (TID) | ORAL | Status: DC

## 2015-07-08 MED ORDER — SERTRALINE HCL 50 MG PO TABS: 150.0000 mg | ORAL_TABLET | Freq: Every day | ORAL | Status: DC

## 2015-07-08 MED ORDER — METFORMIN HCL 500 MG PO TABS: 500.0000 mg | ORAL_TABLET | Freq: Two times a day (BID) | ORAL | Status: DC

## 2015-07-08 MED ORDER — CLONIDINE HCL 0.2 MG PO TABS
0.2000 mg | ORAL_TABLET | Freq: Two times a day (BID) | ORAL | Status: DC
Start: 2015-07-08 — End: 2015-09-23

## 2015-07-08 MED ORDER — TRAZODONE HCL 100 MG PO TABS: 100.0000 mg | ORAL_TABLET | Freq: Every day | ORAL | Status: DC

## 2015-07-08 MED ORDER — LOSARTAN POTASSIUM 50 MG PO TABS: 50.0000 mg | ORAL_TABLET | Freq: Every day | ORAL | Status: DC

## 2015-07-08 MED ORDER — VERAPAMIL HCL ER 240 MG PO TBCR: 240.0000 mg | EXTENDED_RELEASE_TABLET | Freq: Every day | ORAL | Status: DC

## 2015-07-08 MED ORDER — OLANZAPINE 15 MG PO TABS: 15.0000 mg | ORAL_TABLET | Freq: Every day | ORAL | Status: DC

## 2015-07-08 MED ORDER — ARIPIPRAZOLE ER 400 MG IM SUSR: 400.0000 mg | INTRAMUSCULAR | Status: DC

## 2015-07-08 NOTE — Discharge Instructions (Signed)
Patient will follow up with [facility] on 11/10 to be evaluated for inpatient treatment. Patient was discharged this date to his case manager from "[facility]" who will link him to his mother's residence where he will reside until he has his appointment at [facility] on 11/10. This writer and case manager met with patient on discharge to discuss relapse prevention and medication management. Patient was discharged with information on outpatient services with case worker stating patient was also going to be linked to [facility] this date to investigate substance abuse programs.   °

## 2015-07-08 NOTE — Progress Notes (Signed)
D: Pt d/c home as per MD's orders. Pt was picked up by his case worker. Denies SI, HI and AVH when assessed. Reported low-mid back pain 8/10.  A: Scheduled and PRN medications administered as prescribed. D/C instructions reviewed with pt including outside appointment or  Follow up care. All belongings in locker 24 returned to pt at time of d/c.Continued support, availability and encouragement provided to pt throughout his stay in the observation unit. Q 15 minutes checks maintained for safety as ordered till time of D/C. R: Pt cooperative with care. Denies adverse drug reactions when assessed. Reported pain relief from back pain when reassessed. Verbalized understanding of d/c instructions. Pt signed belonging sheet in agreement with items received from locker 24 prior to departure from facility. Remained safe in observation unit till time of d/c.

## 2015-07-08 NOTE — Telephone Encounter (Signed)
Chart handoff to EDP for treatment plan for +AntiHCV >11.0

## 2015-07-08 NOTE — BH Assessment (Signed)
[place] Assessment Progress Note  Patient will follow up with [facility] on 11/10 to be evaluated for inpatient treatment. Patient was discharged this date to his case manager from "[facility]" who will link him to his mother's residence where he will reside until he has his appointment at [facility] on 11/10. This Probation officer and Tourist information centre manager met with patient on discharge to discuss relapse prevention and medication management. Patient was discharged with information on outpatient services with case worker stating patient was also going to be linked to [facility] this date to investigate substance abuse programs.

## 2015-07-08 NOTE — Progress Notes (Signed)
Patient ID: Duane Salazar, male   DOB: 05/11/1976, 38 y.o.   MRN: [id number]  Pleasant and cooperative. Reports and observed slept well through the night. Denies si/hi/ this am. Contracts for safety. Pt urinated blood this am, showed this Probation officer. Pt reports that he has slight pain in abdomen and lower back, PA aware, follow up with outpatient this am when discharged. Made pt aware, pt receptive.

## 2015-07-08 NOTE — Discharge Summary (Signed)
[facility] Discharge Summary Note  Patient:  Duane Salazar is an 39 y.o., male MRN:  [id number] DOB:  Jan 15, 1976 Patient phone:  [phone] (home)  Patient address:   [place] [address],  Total Time spent with patient: 30 minutes  Date of Admission:  07/06/2015 Date of Discharge: 07/08/15  Reason for Admission:  Suicidal thoughts, substance abuse  Principal Problem: Polysubstance dependence including opioid type drug, continuous use [facility]) Discharge Diagnoses: Patient Active Problem List   Diagnosis Date Noted  . Schizoaffective disorder, depressive type ([place]) [F25.1]   . Schizoaffective disorder, unspecified ([place]) [F25.9] 07/06/2015  . Polysubstance dependence including opioid type drug, continuous use ([place]) [F11.20, F19.20] 07/06/2015  . Schizoaffective disorder ([place]) [F25.9] 06/27/2015  . Syncope and collapse [R55] 03/21/2015  . AKI (acute kidney injury) ([place]) [N17.9] 03/21/2015  . Hypokalemia [E87.6] 03/21/2015  . Hypotension [I95.9] 03/21/2015  . 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 Arterial hypotension [I95.9]   . Laceration of forehead [S01.81XA]   . Faintness [R55] 03/11/2015  . Chronic pain syndrome [G89.4] 03/11/2015  . 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 Right Achilles tendinitis [M76.61] 01/02/2015  . Chronic headache [R51] 01/02/2015  . Right maxillary sinusitis, chronic [J32.0] 01/02/2015  . 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 Syncope [R55] 01/02/2015  . Onychomycosis of toenail [B35.1] 01/02/2015  . HTN (hypertension) [I10] 10/21/2014  . Depression [F32.9] 10/21/2014  . Morbid obesity with BMI of 40.0-44.9, adult ([place]) [E66.01, Z68.41] 10/21/2014  . Sleep apnea [G47.30]   . 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 Arthritis [M19.90]   . DM2 (diabetes mellitus, type 2) ([place]) [E11.9]     Musculoskeletal: Strength & Muscle Tone: within normal limits Gait & Station:  normal Patient leans: N/A  Psychiatric Specialty Exam: Physical Exam  Constitutional: He appears well-nourished.  HENT:  Head: Atraumatic.    Review of Systems  Constitutional: Negative.   HENT: Negative.   Eyes: Negative.   Respiratory: Negative.   Cardiovascular: Negative.   Gastrointestinal: Negative.   Genitourinary: Negative.   Musculoskeletal: Negative.   Skin: Negative.   Neurological: Negative.   Endo/Heme/Allergies: Negative.   Psychiatric/Behavioral: Positive for depression (Stable ) and substance abuse. Negative for suicidal ideas, hallucinations and memory loss. The patient is not nervous/anxious and does not have insomnia.     Blood pressure 138/87, pulse 87, temperature 97.8 F (36.6 C), temperature source Oral, resp. rate 16, height 6\' 3"  (1.905 m), weight 129.275 kg (285 lb), SpO2 99 %.Body mass index is 35.62 kg/(m^2).  General Appearance: Fairly Groomed  Engineer, water::  Good  Speech:  Clear and Coherent  Volume:  Normal  Mood:  Euthymic  Affect:  Appropriate  Thought Process:  Linear  Orientation:  Full (Time, Place, and Person)  Thought Content:  WDL  Suicidal Thoughts:  No  Homicidal Thoughts:  No  Memory:  Immediate;   Good Recent;   Good Remote;   Good  Judgement:  Good  Insight:  Good  Psychomotor Activity:  Normal  Concentration:  Good  Recall:  NA  Fund of Knowledge:Good  Language: Good  Akathisia:  No  Handed:    AIMS (if indicated):     Assets:  Communication Skills Housing Social Support  ADL's:  Intact  Cognition: WNL  Sleep:       Past Psychiatric History: Patient indicates that he has been hospitalized about 3 times. He states they have been at [facility]. He states he  was at [facility] for detox. He states his most recent hospitalization was 2-3 months ago at [facility].  He denies any past suicide attempts.    Substance Abuse History in the last 12 months: Yes.  patient indicates he smokes one pack of  cigarettes a day since age 84. He states he's use cocaine in powder and crack form, first using at age 1 and using about one quarter of an ounce a day. He states his last use was about 7-8 days ago. Alcohol use he drinks 40 ounces a day but denies it's ever been heavy, drinking totally blacks out her morning use. Marijuana use he states he might use 2-3 blunts a week. He states he has used heroin when and last used regularly 1-2 years ago but states that now it's begun to pick up where he might use it 3-4 times a month using 3 $20 bags. He states occasionally his use Percocet or OxyContin pills. Consequences of Substance Abuse: NA Previous Psychotropic Medications: Yes  Psychological Evaluations: Yes  Past Medical History:  Past Medical History  Diagnosis Date  . 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 Hypertension Dx 2002  . 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[place]) 2004  . Sleep apnea 1995    on CPAP  . Arthritis Dx 2002  . Diabetes type 2, controlled ([place]) 2010  . AKI (acute kidney injury) ([place]) 03/21/2015  . Schizophrenia (Home)   . Schizo affective schizophrenia ([place]) 1995  . Seizures ([place])   . Headache     Past Surgical History  Procedure Laterality Date  . Pituitary surgery  2005 & 2012   Family History:  Family History  Problem Relation Age of Onset  . Cancer Maternal Uncle   . Cancer Maternal Grandmother   . Heart disease Neg Hx   . Hypertension Mother   . Diabetes Mother    Family Psychiatric History: Patient indicates his mother is treated for depression Social History:  History  Alcohol Use  . 1.2 oz/week  . 2 Cans of beer, 0 Standard drinks or equivalent per week    Comment: 2 40s a day    History  Drug Use  . Yes  . Special: Cocaine, Marijuana    Comment: Reports he has not used recently    Social History   Social History  . Marital Status: Married    Spouse Name: N/A  .  Number of Children: 2  . Years of Education: GED   Social History Main Topics  . Smoking status: Current Every Day Smoker -- 1.00 packs/day for 20 years    Types: Cigarettes  . Smokeless tobacco: Never Used     Comment: Smoking .5 ppd  . Alcohol Use: 1.2 oz/week    2 Cans of beer, 0 Standard drinks or equivalent per week     Comment: 2 40s a day  . Drug Use: Yes    Special: Cocaine, Marijuana     Comment: Reports he has not used recently  . Sexual Activity: Yes   Other Topics Concern  . None   Social History Narrative   Lives with mom.   Incarcerated for 22 months in [place], [place]. From 2014-09/2014       Hospital Course:   Kasten Leveque is an 39 y.o. male who presented to [facility] voluntarily by [employer]. Patient stated that he did not know who called the police. Patient states that he also does not remember where he was picked up from.  Patient stated that he is suicidal "off  and on" and with plan to jump from a bridge. He stated that he has had thoughts in the past six months but no recent attempts. Patient denies HI and history of being violent towards others. Patient stated that he has seen [name] in the past but states that his last appointment was "a while ago." Patient states that he has auditory and visual hallucinations of his family.  Patient states that the voices do not tell him to kill himself or anyone else.  Patient was discharged from [facility] on 06/30/2015 but was not compliant with his prescribed medications. The patient used multiple substances after his discharge to include alcohol, cocaine, and heroine.   Patient was admitted to the [facility] for further monitoring and evaluation. The patient reported suicidal ideation to jump from a bridge due to stress from his mental illness and substance abuse. [place] was restarted on his most recent medication regimen and the social worker explored options for  residential treatment. His case worker was contacted who agreed to pick the patient up for transportation to [facility] to check for bed availability. On the day of discharge the patient denied any active suicidal ideation and reported he would feel safe going back to live with mother. The patient per his case worker also has a screen appointment with [facility] on 11/10 at 8 am. Earnestine was found stable for discharge on 07/08/2015 and reported that he still had his prescriptions for his psychiatric medications as he did not fill them after his recent discharge. Patient reported following up at the [facility] for medical concerns and was informed of his elevated liver enzymes that were noted. Patient agreed to follow up for this concern as soon as possible.   Discharge Vitals:   Blood pressure 138/87, pulse 87, temperature 97.8 F (36.6 C), temperature source Oral, resp. rate 16, height 6\' 3"  (1.905 m), weight 129.275 kg (285 lb), SpO2 99 %. Body mass index is 35.62 kg/(m^2).  Lab Results:   07/06/2015 07:33 HCV Ab greater than eleven UDS positive for opiates, cocaine, and marijuana, alcohol level 75 Chemistry panel unremarkable except for AST 510 and ALT 632      Discharge Instructions    Discharge instructions    Complete by:  As directed   Please follow up with your Primary Care Provider regarding management of further medical problems. Also for further evaluation for acute elevation of liver enzymes. Your Hepatitis panel showed evidence of Hepatitis C infection, which will require further work up.            Medication List    STOP taking these medications        predniSONE 2.5 MG tablet  Commonly known as:  DELTASONE      TAKE these medications      Indication   ARIPiprazole 400 MG Susr  Commonly known as:  ABILIFY MAINTENA  Inject 400 mg into the muscle every 30 (thirty) days.  Start taking on:  07/25/2015   Indication:  Schizoaffective Disorder     atorvastatin 40 MG  tablet  Commonly known as:  LIPITOR  Take 1 tablet (40 mg total) by mouth daily.   Indication:  Elevation of Both Cholesterol and Triglycerides in Blood     cloNIDine 0.2 MG tablet  Commonly known as:  CATAPRES  Take 1 tablet (0.2 mg total) by mouth 2 (two) times daily.   Indication:  High Blood Pressure     gabapentin 300 MG capsule  Commonly known  as:  NEURONTIN  Take 2 capsules (600 mg total) by mouth 3 (three) times daily.   Indication:  Aggressive Behavior, Cocaine Dependence     losartan 50 MG tablet  Commonly known as:  COZAAR  Take 1 tablet (50 mg total) by mouth daily.   Indication:  High Blood Pressure     metFORMIN 500 MG tablet  Commonly known as:  GLUCOPHAGE  Take 1 tablet (500 mg total) by mouth 2 (two) times daily after a meal.   Indication:  Type 2 Diabetes     MOBIC 15 MG tablet  Generic drug:  meloxicam  Take 15 mg by mouth daily as needed. For inflammation/pain.      OLANZapine 15 MG tablet  Commonly known as:  ZYPREXA  Take 1 tablet (15 mg total) by mouth at bedtime.   Indication:  Schizoaffective Disorder     sertraline 50 MG tablet  Commonly known as:  ZOLOFT  Take 3 tablets (150 mg total) by mouth daily.   Indication:  Major Depressive Disorder     traZODone 100 MG tablet  Commonly known as:  DESYREL  Take 1 tablet (100 mg total) by mouth at bedtime.   Indication:  Trouble Sleeping     verapamil 240 MG CR tablet  Commonly known as:  CALAN-SR  Take 1 tablet (240 mg total) by mouth daily.   Indication:  High Blood Pressure of Unknown Cause        Total Discharge Time: 30 minutes  Signed: Elmarie Shiley, NP-C 07/08/2015, 12:07 PM

## 2015-07-11 ENCOUNTER — Telehealth (HOSPITAL_BASED_OUTPATIENT_CLINIC_OR_DEPARTMENT_OTHER): Payer: Self-pay | Admitting: Emergency Medicine

## 2015-08-04 ENCOUNTER — Ambulatory Visit: Payer: Self-pay | Admitting: Family Medicine

## 2015-09-11 ENCOUNTER — Emergency Department (HOSPITAL_COMMUNITY)
Admission: EM | Admit: 2015-09-11 | Discharge: 2015-09-11 | Discharge status: Against Medical Advice | Payer: Self-pay | Attending: Emergency Medicine | Admitting: Emergency Medicine

## 2015-09-11 ENCOUNTER — Encounter (HOSPITAL_COMMUNITY): Payer: Self-pay | Admitting: Emergency Medicine

## 2015-09-11 ENCOUNTER — Emergency Department (HOSPITAL_COMMUNITY): Payer: Self-pay

## 2015-09-11 DIAGNOSIS — F17210 Nicotine dependence, cigarettes, uncomplicated: Secondary | ICD-10-CM | POA: Insufficient documentation

## 2015-09-11 DIAGNOSIS — I10 Essential (primary) hypertension: Secondary | ICD-10-CM | POA: Insufficient documentation

## 2015-09-11 DIAGNOSIS — R51 Headache: Secondary | ICD-10-CM | POA: Insufficient documentation

## 2015-09-11 DIAGNOSIS — D496 Neoplasm of unspecified behavior of brain: Secondary | ICD-10-CM | POA: Insufficient documentation

## 2015-09-11 DIAGNOSIS — E119 Type 2 diabetes mellitus without complications: Secondary | ICD-10-CM | POA: Insufficient documentation

## 2015-09-11 LAB — CBC
HCT: 44.4 % (ref 39.0–52.0)
Hemoglobin: 14.1 g/dL (ref 13.0–17.0)
MCH: 23.4 pg — ABNORMAL LOW (ref 26.0–34.0)
MCHC: 31.8 g/dL (ref 30.0–36.0)
MCV: 73.8 fL — ABNORMAL LOW (ref 78.0–100.0)
Platelets: 255 10*3/uL (ref 150–400)
RBC: 6.02 MIL/uL — ABNORMAL HIGH (ref 4.22–5.81)
RDW: 15.4 % (ref 11.5–15.5)
WBC: 7.4 10*3/uL (ref 4.0–10.5)

## 2015-09-11 LAB — BASIC METABOLIC PANEL
Anion gap: 12 (ref 5–15)
BUN: 6 mg/dL (ref 6–20)
CO2: 19 mmol/L — ABNORMAL LOW (ref 22–32)
Calcium: 9.6 mg/dL (ref 8.9–10.3)
Chloride: 101 mmol/L (ref 101–111)
Creatinine, Ser: 0.98 mg/dL (ref 0.61–1.24)
GFR calc Af Amer: >60 mL/min (ref >60)
GFR calc non Af Amer: >60 mL/min (ref >60)
Glucose, Bld: 486 mg/dL — ABNORMAL HIGH (ref 65–99)
Potassium: 4.2 mmol/L (ref 3.5–5.1)
Sodium: 132 mmol/L — ABNORMAL LOW (ref 135–145)

## 2015-09-11 NOTE — ED Notes (Addendum)
Pt states he was diagnosed with a brain tumor in 2006. Pt states for the last 2 weeks he has been having a headache and having blurry vision. No dizziness. Denies and n/v. No other neuro deficits. Pt states up until 2 weeks ago he was taking long term prednisone but ran out.

## 2015-09-12 ENCOUNTER — Encounter (HOSPITAL_COMMUNITY): Payer: Self-pay | Admitting: Family Medicine

## 2015-09-12 ENCOUNTER — Emergency Department (HOSPITAL_COMMUNITY)
Admission: EM | Admit: 2015-09-12 | Discharge: 2015-09-12 | Discharge status: Against Medical Advice | Payer: Self-pay | Attending: Emergency Medicine | Admitting: Emergency Medicine

## 2015-09-12 ENCOUNTER — Ambulatory Visit: Payer: Self-pay | Admitting: Neurology

## 2015-09-12 DIAGNOSIS — F209 Schizophrenia, unspecified: Secondary | ICD-10-CM | POA: Insufficient documentation

## 2015-09-12 DIAGNOSIS — E119 Type 2 diabetes mellitus without complications: Secondary | ICD-10-CM | POA: Insufficient documentation

## 2015-09-12 DIAGNOSIS — F17210 Nicotine dependence, cigarettes, uncomplicated: Secondary | ICD-10-CM | POA: Insufficient documentation

## 2015-09-12 DIAGNOSIS — Z86018 Personal history of other benign neoplasm: Secondary | ICD-10-CM | POA: Insufficient documentation

## 2015-09-12 DIAGNOSIS — G4730 Sleep apnea, unspecified: Secondary | ICD-10-CM | POA: Insufficient documentation

## 2015-09-12 DIAGNOSIS — Z9981 Dependence on supplemental oxygen: Secondary | ICD-10-CM | POA: Insufficient documentation

## 2015-09-12 DIAGNOSIS — Z79899 Other long term (current) drug therapy: Secondary | ICD-10-CM | POA: Insufficient documentation

## 2015-09-12 DIAGNOSIS — R519 Headache, unspecified: Secondary | ICD-10-CM

## 2015-09-12 DIAGNOSIS — Z87448 Personal history of other diseases of urinary system: Secondary | ICD-10-CM | POA: Insufficient documentation

## 2015-09-12 DIAGNOSIS — R51 Headache: Secondary | ICD-10-CM | POA: Insufficient documentation

## 2015-09-12 DIAGNOSIS — I10 Essential (primary) hypertension: Secondary | ICD-10-CM | POA: Insufficient documentation

## 2015-09-12 DIAGNOSIS — M1990 Unspecified osteoarthritis, unspecified site: Secondary | ICD-10-CM | POA: Insufficient documentation

## 2015-09-12 DIAGNOSIS — Z7984 Long term (current) use of oral hypoglycemic drugs: Secondary | ICD-10-CM | POA: Insufficient documentation

## 2015-09-12 NOTE — ED Provider Notes (Signed)
CSN: [id number]     Arrival date & time 09/12/15  1340 History   First MD Initiated Contact with Patient 09/12/15 1357     Chief Complaint  Patient presents with  . Headache   HPI   Duane Salazar is a 40 y.o. M PMH significant for pituitary macroadenoma (2006), HTN, acromegaly, DM, AKI, schizophrenia, seizures presenting with a 2 week history of headaches. He endorses blurred vision and sweats, and states his symptoms are similar to when he had a brain tumor. He describes his headaches as diffuse, non-radiating, achy, constant, 8/10 pain scale. He has been off of methadone for 2 weeks. He denies fevers, chest pain, abdominal pain, SOB, N/V.   Past Medical History  Diagnosis Date  . 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 Hypertension Dx 2002  . 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 Sleep apnea 1995    on CPAP  . Arthritis Dx 2002  . Diabetes type 2, controlled ([place]) 2010  . AKI (acute kidney injury) ([place]) 03/21/2015  . Schizophrenia ([place])   . Schizo affective schizophrenia ([place]) 1995  . Seizures ([place])   . Headache    Past Surgical History  Procedure Laterality Date  . Pituitary surgery  2005 & 2012  . Skin biopsy     Family History  Problem Relation Age of Onset  . Cancer Maternal Uncle   . Cancer Maternal Grandmother   . Heart disease Neg Hx   . Hypertension Mother   . Diabetes Mother    Social History  Substance Use Topics  . Smoking status: Current Every Day Smoker -- 1.00 packs/day for 20 years    Types: Cigarettes  . Smokeless tobacco: Never Used     Comment: Smoking .5 ppd  . Alcohol Use: 1.2 oz/week    2 Cans of beer, 0 Standard drinks or equivalent per week     Comment: 2 40s a day    Review of Systems  Ten systems are reviewed and are negative for acute change except as noted in the HPI  Allergies  Review of patient's allergies indicates no known allergies.  Home Medications   Prior to Admission medications   Medication Sig Start Date End Date Taking? Authorizing  Provider  ARIPiprazole (ABILIFY MAINTENA) 400 MG SUSR Inject 400 mg into the muscle every 30 (thirty) days. 07/25/15   Niel Hummer, NP  atorvastatin (LIPITOR) 40 MG tablet Take 1 tablet (40 mg total) by mouth daily. 07/08/15   Niel Hummer, NP  cloNIDine (CATAPRES) 0.2 MG tablet Take 1 tablet (0.2 mg total) by mouth 2 (two) times daily. 07/08/15   Niel Hummer, NP  gabapentin (NEURONTIN) 300 MG capsule Take 2 capsules (600 mg total) by mouth 3 (three) times daily. 07/08/15   Niel Hummer, NP  losartan (COZAAR) 50 MG tablet Take 1 tablet (50 mg total) by mouth daily. 07/08/15   Niel Hummer, NP  metFORMIN (GLUCOPHAGE) 500 MG tablet Take 1 tablet (500 mg total) by mouth 2 (two) times daily after a meal. 07/08/15   Niel Hummer, NP  MOBIC 15 MG tablet Take 15 mg by mouth daily as needed. For inflammation/pain. 05/02/15   Historical Provider, MD  OLANZapine (ZYPREXA) 15 MG tablet Take 1 tablet (15 mg total) by mouth at bedtime. 07/08/15   Niel Hummer, NP  sertraline (ZOLOFT) 50 MG tablet Take 3 tablets (150 mg total) by mouth daily. 07/08/15   Niel Hummer, NP  traZODone (DESYREL) 100 MG tablet Take  1 tablet (100 mg total) by mouth at bedtime. 07/08/15   Niel Hummer, NP  verapamil (CALAN-SR) 240 MG CR tablet Take 1 tablet (240 mg total) by mouth daily. 07/08/15   Niel Hummer, NP   BP 125/80 mmHg  Pulse 112  Temp(Src) 97.7 F (36.5 C) (Oral)  Resp 18  SpO2 98% Physical Exam  Constitutional: He is oriented to person, place, and time. He appears well-developed and well-nourished. No distress.  HENT:  Head: Normocephalic and atraumatic.  Eyes: Conjunctivae are normal. Right eye exhibits no discharge. Left eye exhibits no discharge. No scleral icterus.  Neck: No tracheal deviation present.  Cardiovascular: Normal rate.   Pulmonary/Chest: Effort normal. No respiratory distress.  Abdominal: Soft. Bowel sounds are normal. He exhibits no distension.  Musculoskeletal: He exhibits no edema.   Neurological: He is alert and oriented to person, place, and time. Coordination normal.  Skin: Skin is warm and dry. No rash noted. He is not diaphoretic. No erythema.  Psychiatric: He has a normal mood and affect. His behavior is normal.  Nursing note and vitals reviewed.   ED Course  Procedures   MDM   Final diagnoses:  Headache, unspecified headache type   Head CT from last night was unremarkable.  During patient interview, prior to a full neuro exam, I told him I would not be able to give him methadone, but I would gladly give him a headache cocktail today to treat his pain. He states that he's had a headache cocktail before, and this did nothing for him. He got up and stated "I'll just go to [facility] so I can get pain medication." Patient left AMA. Ambulated out of ED without difficulty.   [place], PA-C 09/13/15 [place], MD 09/16/15 [phone]

## 2015-09-12 NOTE — ED Notes (Addendum)
Pt left AMA, PA aware and spoke with pt prior to leaving.  NAD, VSS, ambulatory at departure.

## 2015-09-12 NOTE — ED Notes (Signed)
Pt here for reoccurring symptoms of headaches and sweats that were similar to his previous brain tumor. Was seen here last night.

## 2015-09-12 NOTE — ED Notes (Signed)
Pt reports when he was seen here last night he had to leave before he was given his results of all his testing or his discharge papers.

## 2015-09-15 ENCOUNTER — Encounter (HOSPITAL_COMMUNITY): Payer: Self-pay | Admitting: *Deleted

## 2015-09-15 ENCOUNTER — Emergency Department (HOSPITAL_COMMUNITY)
Admission: EM | Admit: 2015-09-15 | Discharge: 2015-09-18 | Disposition: A | Discharge status: Other Acute Inpatient Hospital | Payer: Federal, State, Local not specified - Other | Attending: Emergency Medicine | Admitting: Emergency Medicine

## 2015-09-15 ENCOUNTER — Ambulatory Visit: Payer: Self-pay | Admitting: Family Medicine

## 2015-09-15 DIAGNOSIS — G4730 Sleep apnea, unspecified: Secondary | ICD-10-CM | POA: Insufficient documentation

## 2015-09-15 DIAGNOSIS — F259 Schizoaffective disorder, unspecified: Secondary | ICD-10-CM | POA: Insufficient documentation

## 2015-09-15 DIAGNOSIS — Z86011 Personal history of benign neoplasm of the brain: Secondary | ICD-10-CM | POA: Insufficient documentation

## 2015-09-15 DIAGNOSIS — F329 Major depressive disorder, single episode, unspecified: Secondary | ICD-10-CM | POA: Diagnosis present

## 2015-09-15 DIAGNOSIS — I10 Essential (primary) hypertension: Secondary | ICD-10-CM | POA: Insufficient documentation

## 2015-09-15 DIAGNOSIS — Z046 Encounter for general psychiatric examination, requested by authority: Secondary | ICD-10-CM

## 2015-09-15 DIAGNOSIS — F1420 Cocaine dependence, uncomplicated: Secondary | ICD-10-CM | POA: Diagnosis present

## 2015-09-15 DIAGNOSIS — Z87448 Personal history of other diseases of urinary system: Secondary | ICD-10-CM | POA: Insufficient documentation

## 2015-09-15 DIAGNOSIS — Z79899 Other long term (current) drug therapy: Secondary | ICD-10-CM | POA: Insufficient documentation

## 2015-09-15 DIAGNOSIS — M1990 Unspecified osteoarthritis, unspecified site: Secondary | ICD-10-CM | POA: Insufficient documentation

## 2015-09-15 DIAGNOSIS — Z7984 Long term (current) use of oral hypoglycemic drugs: Secondary | ICD-10-CM | POA: Insufficient documentation

## 2015-09-15 DIAGNOSIS — R45851 Suicidal ideations: Secondary | ICD-10-CM | POA: Insufficient documentation

## 2015-09-15 DIAGNOSIS — F17210 Nicotine dependence, cigarettes, uncomplicated: Secondary | ICD-10-CM | POA: Insufficient documentation

## 2015-09-15 DIAGNOSIS — F1410 Cocaine abuse, uncomplicated: Secondary | ICD-10-CM | POA: Insufficient documentation

## 2015-09-15 DIAGNOSIS — Z9981 Dependence on supplemental oxygen: Secondary | ICD-10-CM | POA: Insufficient documentation

## 2015-09-15 DIAGNOSIS — E119 Type 2 diabetes mellitus without complications: Secondary | ICD-10-CM | POA: Insufficient documentation

## 2015-09-15 DIAGNOSIS — F251 Schizoaffective disorder, depressive type: Secondary | ICD-10-CM | POA: Diagnosis present

## 2015-09-15 DIAGNOSIS — F22 Delusional disorders: Secondary | ICD-10-CM | POA: Diagnosis present

## 2015-09-15 LAB — CBG MONITORING, ED
Glucose-Capillary: 479 mg/dL — ABNORMAL HIGH (ref 65–99)
Glucose-Capillary: 369 mg/dL — ABNORMAL HIGH (ref 65–99)
Glucose-Capillary: 384 mg/dL — ABNORMAL HIGH (ref 65–99)
Glucose-Capillary: 387 mg/dL — ABNORMAL HIGH (ref 65–99)

## 2015-09-15 LAB — COMPREHENSIVE METABOLIC PANEL
ALT: 31 U/L (ref 17–63)
AST: 31 U/L (ref 15–41)
Albumin: 4 g/dL (ref 3.5–5.0)
Alkaline Phosphatase: 78 U/L (ref 38–126)
Anion gap: 10 (ref 5–15)
BUN: 13 mg/dL (ref 6–20)
CO2: 22 mmol/L (ref 22–32)
Calcium: 9.3 mg/dL (ref 8.9–10.3)
Chloride: 99 mmol/L — ABNORMAL LOW (ref 101–111)
Creatinine, Ser: 1 mg/dL (ref 0.61–1.24)
GFR calc Af Amer: >60 mL/min (ref >60)
GFR calc non Af Amer: >60 mL/min (ref >60)
Glucose, Bld: 498 mg/dL — ABNORMAL HIGH (ref 65–99)
Potassium: 4.7 mmol/L (ref 3.5–5.1)
Sodium: 131 mmol/L — ABNORMAL LOW (ref 135–145)
Total Bilirubin: 1.1 mg/dL (ref 0.3–1.2)
Total Protein: 6.8 g/dL (ref 6.5–8.1)

## 2015-09-15 LAB — CBC WITH DIFFERENTIAL/PLATELET
Basophils Absolute: 0.1 10*3/uL (ref 0.0–0.1)
Basophils Relative: 1 %
Eosinophils Absolute: 0.2 10*3/uL (ref 0.0–0.7)
Eosinophils Relative: 3 %
HCT: 43.4 % (ref 39.0–52.0)
Hemoglobin: 13.7 g/dL (ref 13.0–17.0)
Lymphocytes Relative: 28 %
Lymphs Abs: 1.8 10*3/uL (ref 0.7–4.0)
MCH: 23.4 pg — ABNORMAL LOW (ref 26.0–34.0)
MCHC: 31.6 g/dL (ref 30.0–36.0)
MCV: 74.1 fL — ABNORMAL LOW (ref 78.0–100.0)
Monocytes Absolute: 0.5 10*3/uL (ref 0.1–1.0)
Monocytes Relative: 8 %
Neutro Abs: 3.8 10*3/uL (ref 1.7–7.7)
Neutrophils Relative %: 60 %
Platelets: 245 10*3/uL (ref 150–400)
RBC: 5.86 MIL/uL — ABNORMAL HIGH (ref 4.22–5.81)
RDW: 15.1 % (ref 11.5–15.5)
WBC: 6.4 10*3/uL (ref 4.0–10.5)

## 2015-09-15 LAB — RAPID URINE DRUG SCREEN, HOSP PERFORMED
Amphetamines: NOT DETECTED
Barbiturates: NOT DETECTED
Benzodiazepines: NOT DETECTED
Cocaine: POSITIVE — AB
Opiates: NOT DETECTED
Tetrahydrocannabinol: NOT DETECTED

## 2015-09-15 LAB — URINALYSIS, ROUTINE W REFLEX MICROSCOPIC
Bilirubin Urine: NEGATIVE
Glucose, UA: >1000 mg/dL — AB
Hgb urine dipstick: NEGATIVE
Ketones, ur: NEGATIVE mg/dL
Leukocytes, UA: NEGATIVE
Nitrite: NEGATIVE
Protein, ur: NEGATIVE mg/dL
Specific Gravity, Urine: 1.043 — ABNORMAL HIGH (ref 1.005–1.030)
pH: 6.5 (ref 5.0–8.0)

## 2015-09-15 LAB — TSH: TSH: 0.449 u[IU]/mL (ref 0.350–4.500)

## 2015-09-15 LAB — URINE MICROSCOPIC-ADD ON
Bacteria, UA: NONE SEEN
RBC / HPF: NONE SEEN RBC/hpf (ref 0–5)

## 2015-09-15 LAB — ETHANOL: Alcohol, Ethyl (B): <5 mg/dL (ref <5)

## 2015-09-15 LAB — SALICYLATE LEVEL: Salicylate Lvl: <4 mg/dL (ref 2.8–30.0)

## 2015-09-15 LAB — ACETAMINOPHEN LEVEL: Acetaminophen (Tylenol), Serum: <10 ug/mL — ABNORMAL LOW (ref 10–30)

## 2015-09-15 MED ORDER — LOSARTAN POTASSIUM 50 MG PO TABS
50.0000 mg | ORAL_TABLET | Freq: Every day | ORAL | Status: DC
  Administered 2015-09-16 – 2015-09-18 (×3): 50 mg via ORAL
  Filled 2015-09-15 (×3): qty 1

## 2015-09-15 MED ORDER — ACETAMINOPHEN 325 MG PO TABS: 650.0000 mg | ORAL_TABLET | ORAL | Status: DC | PRN

## 2015-09-15 MED ORDER — ATORVASTATIN CALCIUM 40 MG PO TABS
40.0000 mg | ORAL_TABLET | Freq: Every day | ORAL | Status: DC
  Administered 2015-09-16 – 2015-09-18 (×3): 40 mg via ORAL
  Filled 2015-09-15 (×3): qty 1

## 2015-09-15 MED ORDER — INSULIN ASPART 100 UNIT/ML ~~LOC~~ SOLN
0.0000 [IU] | Freq: Three times a day (TID) | SUBCUTANEOUS | Status: DC
  Administered 2015-09-16 (×2): 7 [IU] via SUBCUTANEOUS
  Administered 2015-09-16: 5 [IU] via SUBCUTANEOUS
  Administered 2015-09-17: 2 [IU] via SUBCUTANEOUS
  Administered 2015-09-17: 3 [IU] via SUBCUTANEOUS
  Administered 2015-09-17: 5 [IU] via SUBCUTANEOUS
  Administered 2015-09-18: 3 [IU] via SUBCUTANEOUS
  Filled 2015-09-15 (×7): qty 1

## 2015-09-15 MED ORDER — SODIUM CHLORIDE 0.9 % IV BOLUS (SEPSIS)
1000.0000 mL | Freq: Once | INTRAVENOUS | Status: AC
  Administered 2015-09-15: 1000 mL via INTRAVENOUS

## 2015-09-15 MED ORDER — TRAZODONE HCL 100 MG PO TABS
100.0000 mg | ORAL_TABLET | Freq: Every day | ORAL | Status: DC
  Administered 2015-09-15 – 2015-09-17 (×3): 100 mg via ORAL
  Filled 2015-09-15 (×3): qty 1

## 2015-09-15 MED ORDER — LORAZEPAM 1 MG PO TABS: 1.0000 mg | ORAL_TABLET | Freq: Three times a day (TID) | ORAL | Status: DC | PRN

## 2015-09-15 MED ORDER — OLANZAPINE 5 MG PO TABS
15.0000 mg | ORAL_TABLET | Freq: Every day | ORAL | Status: DC
  Administered 2015-09-15 – 2015-09-17 (×3): 15 mg via ORAL
  Filled 2015-09-15 (×3): qty 1

## 2015-09-15 MED ORDER — VERAPAMIL HCL ER 240 MG PO TBCR
240.0000 mg | EXTENDED_RELEASE_TABLET | Freq: Every day | ORAL | Status: DC
  Administered 2015-09-16 – 2015-09-18 (×3): 240 mg via ORAL
  Filled 2015-09-15 (×3): qty 1

## 2015-09-15 MED ORDER — ONDANSETRON HCL 4 MG PO TABS: 4.0000 mg | ORAL_TABLET | Freq: Three times a day (TID) | ORAL | Status: DC | PRN

## 2015-09-15 MED ORDER — SERTRALINE HCL 50 MG PO TABS
150.0000 mg | ORAL_TABLET | Freq: Every day | ORAL | Status: DC
  Administered 2015-09-15 – 2015-09-18 (×4): 150 mg via ORAL
  Filled 2015-09-15 (×4): qty 3

## 2015-09-15 MED ORDER — METFORMIN HCL 500 MG PO TABS
500.0000 mg | ORAL_TABLET | Freq: Two times a day (BID) | ORAL | Status: DC
  Administered 2015-09-15 – 2015-09-18 (×6): 500 mg via ORAL
  Filled 2015-09-15 (×8): qty 1

## 2015-09-15 MED ORDER — IBUPROFEN 200 MG PO TABS: 600.0000 mg | ORAL_TABLET | Freq: Three times a day (TID) | ORAL | Status: DC | PRN

## 2015-09-15 MED ORDER — CLONIDINE HCL 0.1 MG PO TABS
0.2000 mg | ORAL_TABLET | Freq: Two times a day (BID) | ORAL | Status: DC
  Administered 2015-09-15 – 2015-09-18 (×6): 0.2 mg via ORAL
  Filled 2015-09-15 (×6): qty 2

## 2015-09-15 MED ORDER — GABAPENTIN 300 MG PO CAPS
600.0000 mg | ORAL_CAPSULE | Freq: Three times a day (TID) | ORAL | Status: DC
  Administered 2015-09-15 – 2015-09-18 (×9): 600 mg via ORAL
  Filled 2015-09-15 (×9): qty 2

## 2015-09-15 MED ORDER — ALUM & MAG HYDROXIDE-SIMETH 200-200-20 MG/5ML PO SUSP: 30.0000 mL | ORAL | Status: DC | PRN

## 2015-09-15 NOTE — Progress Notes (Signed)
Patient has referred at: [facility] - spoke with Alda Berthold, if bed available will take a look at referral. [facility] - per Ander Purpura, fax referral for review. [facility] - per Sydell Axon, fax referral, d/c in am. [facility] - no answer. [facility] - left voicemail [facility] - per [place], fax referral for the waitlist. [facility] - per Olivia Mackie, 1 adolescent male and 1 adult male bed.  At capacity: [facility]   Verlon Setting, [place] Disposition staff 09/15/2015 10:46 PM

## 2015-09-15 NOTE — ED Notes (Signed)
Dr. Oleta Mouse informed of elevated BS.

## 2015-09-15 NOTE — Progress Notes (Signed)
Per chart review patient has ben seen in the ED 5 times within the last six months. Patient's pcp listed as Dr. Adrian Blackwater at [facility].  Patient was scheduled an appointment today 09/15/2015 at 1145am.  [facility] will notify [facility], patient is in the ED.  No further EDCM needs at this time.
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
## 2015-09-15 NOTE — ED Notes (Signed)
Dr. Oleta Mouse informed of pt's CBG of 384.
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
## 2015-09-16 DIAGNOSIS — F1420 Cocaine dependence, uncomplicated: Secondary | ICD-10-CM | POA: Diagnosis present

## 2015-09-16 DIAGNOSIS — F329 Major depressive disorder, single episode, unspecified: Secondary | ICD-10-CM

## 2015-09-16 DIAGNOSIS — F22 Delusional disorders: Secondary | ICD-10-CM | POA: Diagnosis present

## 2015-09-16 DIAGNOSIS — R45850 Homicidal ideations: Secondary | ICD-10-CM

## 2015-09-16 DIAGNOSIS — R45851 Suicidal ideations: Secondary | ICD-10-CM

## 2015-09-16 LAB — CBG MONITORING, ED
Glucose-Capillary: 318 mg/dL — ABNORMAL HIGH (ref 65–99)
Glucose-Capillary: 335 mg/dL — ABNORMAL HIGH (ref 65–99)
Glucose-Capillary: 254 mg/dL — ABNORMAL HIGH (ref 65–99)
Glucose-Capillary: 295 mg/dL — ABNORMAL HIGH (ref 65–99)
Glucose-Capillary: 233 mg/dL — ABNORMAL HIGH (ref 65–99)

## 2015-09-16 NOTE — ED Notes (Signed)
Resting quietly with eye closed. Easily arousable. Verbally responsive. Resp even and unlabored. ABC's intact. No behavior problems noted. NAD noted. No s/s of hypo/hyperglycemia noted. Sitter at bedside.

## 2015-09-16 NOTE — ED Notes (Signed)
[facility] called and reported having a bed available for pt but CBG has to be less than 350 for 24 hrs prior to acceptance.

## 2015-09-16 NOTE — ED Notes (Signed)
Resting quietly with eye closed. Easily arousable. Verbally responsive. Resp even and unlabored. ABC's intact. No behavior problems noted. NAD noted. Sitter at bedside.  

## 2015-09-16 NOTE — [facility] Counselor (Signed)
[facility] called stating that they will consider pt once glucose is under 200. They plan to call back for updated value later this morning. RN made aware. RN states that as of 23:25, BG was 233.
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
## 2015-09-16 NOTE — ED Notes (Signed)
Awake. Verbally responsive. Resp even and unlabored. ABC's intact. No behavior problems noted. NAD noted. Sitter at bedside. Psych MD and NP at bedside.

## 2015-09-16 NOTE — ED Notes (Signed)
Resting quietly with eye closed. Easily arousable. Verbally responsive. Pt eaten 100% of lunch. Pt denies s/s of hypo/hyperglycemia noted. Resp even and unlabored. ABC's intact. No behavior problems noted. NAD noted. Sitter at bedside.

## 2015-09-16 NOTE — ED Notes (Signed)
Resting quietly with eye closed. Easily arousable. Verbally responsive. Resp even and unlabored. ABC's intact. Pt denies SI/HI and audible/visual hallucinations. No behavior problems noted. NAD noted. Sitter at bedside.

## 2015-09-16 NOTE — ED Notes (Signed)
Awake. Eaten 100% of breakfast. Verbally responsive. Resp even and unlabored. ABC's intact. No behavior problems noted. NAD noted. Sitter at bedside.

## 2015-09-17 LAB — CBG MONITORING, ED
Glucose-Capillary: 238 mg/dL — ABNORMAL HIGH (ref 65–99)
Glucose-Capillary: 300 mg/dL — ABNORMAL HIGH (ref 65–99)
Glucose-Capillary: 182 mg/dL — ABNORMAL HIGH (ref 65–99)
Glucose-Capillary: 255 mg/dL — ABNORMAL HIGH (ref 65–99)

## 2015-09-17 NOTE — [facility] Counselor (Signed)
[place] Assessment Progress Note  Counselor spoke with pt today to re-assess. Pt indicates that he still feels suicidal, as well as homicidal towards his wife. TTS will continue to seek placement for pt.   Duane Salazar. Lovena Le, [place], [place], [facility] Counselor

## 2015-09-18 ENCOUNTER — Encounter (HOSPITAL_COMMUNITY): Payer: Self-pay | Admitting: Psychiatry

## 2015-09-18 ENCOUNTER — Inpatient Hospital Stay (HOSPITAL_COMMUNITY)
Admission: AD | Admit: 2015-09-18 | Discharge: 2015-09-23 | DRG: 885 | Disposition: A | Discharge status: Home / Self Care | Payer: Federal, State, Local not specified - Other | Attending: Psychiatry | Admitting: Psychiatry

## 2015-09-18 DIAGNOSIS — F1210 Cannabis abuse, uncomplicated: Secondary | ICD-10-CM | POA: Diagnosis not present

## 2015-09-18 DIAGNOSIS — F333 Major depressive disorder, recurrent, severe with psychotic symptoms: Secondary | ICD-10-CM | POA: Diagnosis present

## 2015-09-18 DIAGNOSIS — E7800 Pure hypercholesterolemia, unspecified: Secondary | ICD-10-CM | POA: Diagnosis present

## 2015-09-18 DIAGNOSIS — E1165 Type 2 diabetes mellitus with hyperglycemia: Secondary | ICD-10-CM

## 2015-09-18 DIAGNOSIS — R45850 Homicidal ideations: Secondary | ICD-10-CM | POA: Diagnosis present

## 2015-09-18 DIAGNOSIS — F1110 Opioid abuse, uncomplicated: Secondary | ICD-10-CM | POA: Diagnosis present

## 2015-09-18 DIAGNOSIS — R45851 Suicidal ideations: Secondary | ICD-10-CM | POA: Diagnosis present

## 2015-09-18 DIAGNOSIS — E119 Type 2 diabetes mellitus without complications: Secondary | ICD-10-CM | POA: Diagnosis present

## 2015-09-18 DIAGNOSIS — G4700 Insomnia, unspecified: Secondary | ICD-10-CM | POA: Diagnosis present

## 2015-09-18 DIAGNOSIS — E1169 Type 2 diabetes mellitus with other specified complication: Secondary | ICD-10-CM | POA: Insufficient documentation

## 2015-09-18 DIAGNOSIS — Z8249 Family history of ischemic heart disease and other diseases of the circulatory system: Secondary | ICD-10-CM

## 2015-09-18 DIAGNOSIS — G894 Chronic pain syndrome: Secondary | ICD-10-CM | POA: Diagnosis present

## 2015-09-18 DIAGNOSIS — Z7984 Long term (current) use of oral hypoglycemic drugs: Secondary | ICD-10-CM

## 2015-09-18 DIAGNOSIS — E669 Obesity, unspecified: Secondary | ICD-10-CM | POA: Insufficient documentation

## 2015-09-18 DIAGNOSIS — IMO0002 Reserved for concepts with insufficient information to code with codable children: Secondary | ICD-10-CM

## 2015-09-18 DIAGNOSIS — F1120 Opioid dependence, uncomplicated: Secondary | ICD-10-CM | POA: Clinically undetermined

## 2015-09-18 DIAGNOSIS — F17210 Nicotine dependence, cigarettes, uncomplicated: Secondary | ICD-10-CM | POA: Diagnosis present

## 2015-09-18 DIAGNOSIS — F251 Schizoaffective disorder, depressive type: Principal | ICD-10-CM | POA: Diagnosis present

## 2015-09-18 DIAGNOSIS — M1990 Unspecified osteoarthritis, unspecified site: Secondary | ICD-10-CM | POA: Diagnosis present

## 2015-09-18 DIAGNOSIS — Z833 Family history of diabetes mellitus: Secondary | ICD-10-CM | POA: Diagnosis not present

## 2015-09-18 DIAGNOSIS — F1010 Alcohol abuse, uncomplicated: Secondary | ICD-10-CM | POA: Diagnosis not present

## 2015-09-18 DIAGNOSIS — F17200 Nicotine dependence, unspecified, uncomplicated: Secondary | ICD-10-CM | POA: Clinically undetermined

## 2015-09-18 DIAGNOSIS — F1420 Cocaine dependence, uncomplicated: Secondary | ICD-10-CM | POA: Diagnosis present

## 2015-09-18 DIAGNOSIS — I10 Essential (primary) hypertension: Secondary | ICD-10-CM | POA: Diagnosis present

## 2015-09-18 DIAGNOSIS — F419 Anxiety disorder, unspecified: Secondary | ICD-10-CM | POA: Diagnosis present

## 2015-09-18 LAB — CBG MONITORING, ED
Glucose-Capillary: 220 mg/dL — ABNORMAL HIGH (ref 65–99)
Glucose-Capillary: 231 mg/dL — ABNORMAL HIGH (ref 65–99)

## 2015-09-18 LAB — GLUCOSE, CAPILLARY: Glucose-Capillary: 347 mg/dL — ABNORMAL HIGH (ref 65–99)

## 2015-09-18 MED ORDER — LORAZEPAM 1 MG PO TABS: 1.0000 mg | ORAL_TABLET | Freq: Four times a day (QID) | ORAL | Status: DC | PRN

## 2015-09-18 MED ORDER — HALOPERIDOL 5 MG PO TABS
5.0000 mg | ORAL_TABLET | Freq: Every evening | ORAL | Status: DC
  Administered 2015-09-18: 5 mg via ORAL
  Filled 2015-09-18 (×3): qty 1

## 2015-09-18 MED ORDER — METFORMIN HCL 500 MG PO TABS
500.0000 mg | ORAL_TABLET | Freq: Two times a day (BID) | ORAL | Status: DC
  Administered 2015-09-18 – 2015-09-23 (×10): 500 mg via ORAL
  Filled 2015-09-18 (×8): qty 1
  Filled 2015-09-18: qty 14
  Filled 2015-09-18 (×5): qty 1
  Filled 2015-09-18: qty 14

## 2015-09-18 MED ORDER — INSULIN ASPART 100 UNIT/ML ~~LOC~~ SOLN
0.0000 [IU] | Freq: Three times a day (TID) | SUBCUTANEOUS | Status: DC
  Administered 2015-09-18: 7 [IU] via SUBCUTANEOUS
  Administered 2015-09-19 (×3): 5 [IU] via SUBCUTANEOUS
  Administered 2015-09-20: 9 [IU] via SUBCUTANEOUS
  Administered 2015-09-20: 7 [IU] via SUBCUTANEOUS
  Administered 2015-09-20: 5 [IU] via SUBCUTANEOUS

## 2015-09-18 MED ORDER — BENZTROPINE MESYLATE 0.5 MG PO TABS
0.5000 mg | ORAL_TABLET | Freq: Every evening | ORAL | Status: DC
  Administered 2015-09-18: 0.5 mg via ORAL
  Filled 2015-09-18 (×3): qty 1

## 2015-09-18 MED ORDER — ATORVASTATIN CALCIUM 40 MG PO TABS
40.0000 mg | ORAL_TABLET | Freq: Every day | ORAL | Status: DC
  Administered 2015-09-19 – 2015-09-23 (×5): 40 mg via ORAL
  Filled 2015-09-18: qty 7
  Filled 2015-09-18 (×6): qty 1

## 2015-09-18 MED ORDER — LORAZEPAM 2 MG/ML IJ SOLN: 1.0000 mg | Freq: Four times a day (QID) | INTRAMUSCULAR | Status: DC | PRN

## 2015-09-18 MED ORDER — OLANZAPINE 5 MG PO TABS: 5.0000 mg | ORAL_TABLET | Freq: Four times a day (QID) | ORAL | Status: DC | PRN

## 2015-09-18 MED ORDER — VERAPAMIL HCL ER 240 MG PO TBCR
240.0000 mg | EXTENDED_RELEASE_TABLET | Freq: Every day | ORAL | Status: DC
  Administered 2015-09-19 – 2015-09-23 (×5): 240 mg via ORAL
  Filled 2015-09-18: qty 7
  Filled 2015-09-18 (×7): qty 1

## 2015-09-18 MED ORDER — ACETAMINOPHEN 325 MG PO TABS
650.0000 mg | ORAL_TABLET | ORAL | Status: DC | PRN
  Administered 2015-09-20: 650 mg via ORAL
  Filled 2015-09-18: qty 2

## 2015-09-18 MED ORDER — ONDANSETRON HCL 4 MG PO TABS: 4.0000 mg | ORAL_TABLET | Freq: Three times a day (TID) | ORAL | Status: DC | PRN

## 2015-09-18 MED ORDER — NICOTINE 21 MG/24HR TD PT24
21.0000 mg | MEDICATED_PATCH | Freq: Every day | TRANSDERMAL | Status: DC
  Administered 2015-09-18 – 2015-09-21 (×4): 21 mg via TRANSDERMAL
  Filled 2015-09-18 (×10): qty 1

## 2015-09-18 MED ORDER — OLANZAPINE 10 MG IM SOLR: 5.0000 mg | Freq: Four times a day (QID) | INTRAMUSCULAR | Status: DC | PRN

## 2015-09-18 MED ORDER — CLONIDINE HCL 0.2 MG PO TABS
0.2000 mg | ORAL_TABLET | Freq: Two times a day (BID) | ORAL | Status: DC
  Administered 2015-09-18 – 2015-09-23 (×10): 0.2 mg via ORAL
  Filled 2015-09-18 (×5): qty 1
  Filled 2015-09-18: qty 2
  Filled 2015-09-18 (×3): qty 1
  Filled 2015-09-18: qty 2
  Filled 2015-09-18: qty 14
  Filled 2015-09-18 (×3): qty 1
  Filled 2015-09-18: qty 14
  Filled 2015-09-18: qty 1

## 2015-09-18 MED ORDER — OLANZAPINE 7.5 MG PO TABS
15.0000 mg | ORAL_TABLET | Freq: Every day | ORAL | Status: DC
  Filled 2015-09-18: qty 2

## 2015-09-18 MED ORDER — GABAPENTIN 300 MG PO CAPS
600.0000 mg | ORAL_CAPSULE | Freq: Three times a day (TID) | ORAL | Status: DC
  Administered 2015-09-18 – 2015-09-23 (×15): 600 mg via ORAL
  Filled 2015-09-18: qty 42
  Filled 2015-09-18 (×3): qty 2
  Filled 2015-09-18: qty 42
  Filled 2015-09-18 (×6): qty 2
  Filled 2015-09-18: qty 42
  Filled 2015-09-18 (×9): qty 2

## 2015-09-18 MED ORDER — TRAZODONE HCL 100 MG PO TABS
100.0000 mg | ORAL_TABLET | Freq: Every day | ORAL | Status: DC
  Administered 2015-09-18 – 2015-09-22 (×5): 100 mg via ORAL
  Filled 2015-09-18: qty 7
  Filled 2015-09-18 (×8): qty 1

## 2015-09-18 MED ORDER — ALUM & MAG HYDROXIDE-SIMETH 200-200-20 MG/5ML PO SUSP: 30.0000 mL | ORAL | Status: DC | PRN

## 2015-09-18 MED ORDER — IBUPROFEN 600 MG PO TABS: 600.0000 mg | ORAL_TABLET | Freq: Three times a day (TID) | ORAL | Status: DC | PRN

## 2015-09-18 MED ORDER — LAMOTRIGINE 25 MG PO TABS
25.0000 mg | ORAL_TABLET | Freq: Every day | ORAL | Status: DC
  Administered 2015-09-18 – 2015-09-23 (×6): 25 mg via ORAL
  Filled 2015-09-18 (×4): qty 1
  Filled 2015-09-18: qty 7
  Filled 2015-09-18 (×3): qty 1

## 2015-09-18 MED ORDER — LOSARTAN POTASSIUM 50 MG PO TABS
50.0000 mg | ORAL_TABLET | Freq: Every day | ORAL | Status: DC
  Administered 2015-09-19 – 2015-09-23 (×5): 50 mg via ORAL
  Filled 2015-09-18 (×6): qty 1
  Filled 2015-09-18: qty 7

## 2015-09-18 MED ORDER — OLANZAPINE 5 MG PO TABS
5.0000 mg | ORAL_TABLET | Freq: Every day | ORAL | Status: DC
  Administered 2015-09-18: 5 mg via ORAL
  Filled 2015-09-18: qty 2
  Filled 2015-09-18 (×2): qty 1

## 2015-09-18 MED ORDER — SERTRALINE HCL 50 MG PO TABS
150.0000 mg | ORAL_TABLET | Freq: Every day | ORAL | Status: DC
  Administered 2015-09-19 – 2015-09-23 (×5): 150 mg via ORAL
  Filled 2015-09-18: qty 3
  Filled 2015-09-18: qty 21
  Filled 2015-09-18 (×5): qty 3

## 2015-09-18 MED ORDER — LORAZEPAM 1 MG PO TABS: 1.0000 mg | ORAL_TABLET | Freq: Three times a day (TID) | ORAL | Status: DC | PRN

## 2015-09-18 NOTE — Progress Notes (Signed)
Pt did not attend wrap up group meeting.
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
## 2015-09-18 NOTE — ED Notes (Signed)
Gave report to Warden/ranger at [facility], tx called

## 2015-09-18 NOTE — BHH Suicide Risk Assessment (Signed)
[facility] Admission Suicide Risk Assessment   Nursing information obtained from:    Demographic factors:    Current Mental Status:    Loss Factors:    Historical Factors:    Risk Reduction Factors:     Total Time spent with patient: 30 minutes Principal Problem: Schizoaffective disorder, depressive type ([place]) Diagnosis:   Patient Active Problem List   Diagnosis Date Noted  . Alcohol use disorder, mild, abuse [F10.10] 09/18/2015  . Cannabis use disorder, mild, abuse [F12.10] 09/18/2015  . Opioid use disorder, moderate, dependence ([place]) [F11.20] 09/18/2015  . Tobacco use disorder [F17.200] 09/18/2015  . Cocaine use disorder, severe, dependence ([place]) [F14.20] 09/16/2015  . Schizoaffective disorder, depressive type ([place]) [F25.1]   . 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 Chronic pain syndrome [G89.4] 03/11/2015  . 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 Chronic headache [R51] 01/02/2015  . Right maxillary sinusitis, chronic [J32.0] 01/02/2015  . 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 HTN (hypertension) [I10] 10/21/2014  . Morbid obesity with BMI of 40.0-44.9, adult ([place]) [E66.01, Z68.41] 10/21/2014  . Sleep apnea [G47.30]   . 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 Arthritis [M19.90]   . DM2 (diabetes mellitus, type 2) ([place]) [E11.9]    Subjective Data: Please see H&P.   Continued Clinical Symptoms:    The "Alcohol Use Disorders Identification Test", Guidelines for Use in Primary Care, Second Edition.  World Pharmacologist [facility]). Score between 0-7:  no or low risk or alcohol related problems. Score between 8-15:  moderate risk of alcohol related problems. Score between 16-19:  high risk of alcohol related problems. Score 20 or above:  warrants further diagnostic evaluation for alcohol dependence and treatment.   CLINICAL FACTORS:   Alcohol/Substance Abuse/Dependencies Previous Psychiatric Diagnoses and  Treatments   Musculoskeletal: Strength & Muscle Tone: within normal limits Gait & Station: normal Patient leans: N/A  Psychiatric Specialty Exam: ROS  Blood pressure 123/78, pulse 78, temperature 97.6 F (36.4 C), temperature source Oral, resp. rate 18, height 6\' 3"  (1.905 m), weight 127.461 kg (281 lb), SpO2 99 %.Body mass index is 35.12 kg/(m^2).                        Please see H&P.                                 COGNITIVE FEATURES THAT CONTRIBUTE TO RISK:  Closed-mindedness, Polarized thinking and Thought constriction (tunnel vision)    SUICIDE RISK:   Moderate:  Frequent suicidal ideation with limited intensity, and duration, some specificity in terms of plans, no associated intent, good self-control, limited dysphoria/symptomatology, some risk factors present, and identifiable protective factors, including available and accessible social support.  PLAN OF CARE: Please see H&P.   I certify that inpatient services furnished can reasonably be expected to improve the patient's condition.   Merina Behrendt, MD 09/18/2015, 1:58 PM

## 2015-09-18 NOTE — Progress Notes (Signed)
D: Duane Salazar has been asleep most of the evening with minimal interaction. He did not attend group. He denies SI/HI/AVH and pain. Contracts for safety. He is minimally communicative with this Probation officer and appearing sleepy. He remains stable and safe on the unit.  A: Q 15 minute checks for patient safety. Medications administered as prescribed. Encouragement and support given.  R: Continue to monitor for patient safety and medication effectiveness.

## 2015-09-18 NOTE — BHH Counselor (Signed)
Adult Comprehensive Assessment  Patient ID: Duane Salazar, male DOB: 1975/09/24, 40 y.o. MRN: [id number]  Information Source: Information source: Patient  Current Stressors:  Educational / Learning stressors: None reported  Employment / Job issues: Pt is unemployed and applying for SSDI with the help of [place] at [employer]: Stays with mother if it is the only place he has to Engineering geologist / Lack of resources (include bankruptcy): No income.  Housing / Lack of housing: Homeless Physical health (include injuries & life threatening diseases): None reported  Social relationships: None reported  Substance abuse: Pt reports using cocaine, marijuana and alcohol.  Bereavement / Loss: None reported   Living/Environment/Situation:  Living Arrangements: Homeless Living conditions (as described by patient or guardian): "Just moved back in with wife 3 weeks ago, she was texting another guy, and so I left" How long has patient lived in current situation?: 3 days ago What is atmosphere in current home: Temporary  Family History:  Marital status: Separated Separated, when?: Feb. 2016, then again 3 days ago after a brief reunion What types of issues is patient dealing with in the relationship?: "She was seeing other guys while I was in prison, and now she was texting guys in front of me."  Does patient have children?: Yes How many children?: 2 How is patient's relationship with their children?: 2 sons, 57 and 87, close relationship.   Childhood History:  By whom was/is the patient raised?: Mother Additional childhood history information: Father was not involved at all.  Description of patient's relationship with caregiver when they were a child: Close with mother  Patient's description of current relationship with people who raised him/her: Close with mother  Does patient have siblings?: No Did patient suffer any verbal/emotional/physical/sexual abuse  as a child?: No Did patient suffer from severe childhood neglect?: No Has patient ever been sexually abused/assaulted/raped as an adolescent or adult?: No Was the patient ever a victim of a crime or a disaster?: No Witnessed domestic violence?: No Has patient been effected by domestic violence as an adult?: No  Education:  Highest grade of school patient has completed: GED Currently a student?: No Name of school: n/a Sport and exercise psychologist person: n/a Learning disability?: No  Employment/Work Situation:  Employment situation: Unemployed Patient's job has been impacted by current illness: No What is the longest time patient has a held a job?: 2 years 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  If attempted suicide, did drugs/alcohol play a role in this?: No Alcohol/Substance Abuse Treatment Hx: Past Tx, Outpatient Has alcohol/substance abuse ever caused legal problems?: No  Social Support System:  Patient's Community Support System: Fair Dietitian Support System: Mother, IRC  Type of faith/religion: NA  How does patient's faith help to cope with current illness?: NA   Leisure/Recreation:  Leisure and Hobbies: Unable to answer   Strengths/Needs:  What things does the patient do well?: Unable to answer  In what areas does patient struggle / problems for patient: substance abuse, depression, money   Discharge Plan:  Does patient have access to transportation?: Yes Will patient be returning to same living situation after discharge?: Yes Currently receiving community  mental health services: Yes (From Whom) [facility] ) Does  patient have financial barriers related to discharge medications?: Yes Patient description of barriers related to discharge medications: No insurance, no income.   Summary/Recommendations:  Fenix is a 40 year old AA male with a diagnosis of Schizoaffective D/O, substance use and multiple medical issues. He presents with SI and HI, stating this started when he and his wife reunitied briefly and she is not trustworthy. He receives outpatient services at [facility] and case management services at [facility]. Although he is homeless, he is optimistic that he will have housing soon through the [facility], and does not appear to be here for a place to stay.  Although he admits to polysubstance use, he declined a referral to rehab or half way house. Recommendations include; crisis stabilization, medication management, therapeutic milieu, and encourage group attendance and participation.   Barbaraann Rondo [place] LCSW 09/18/2015

## 2015-09-18 NOTE — Tx Team (Addendum)
Initial Interdisciplinary Treatment Plan   PATIENT STRESSORS: Marital or family conflict Medication change or noncompliance Substance abuse   PATIENT STRENGTHS: Ability for insight Average or above average intelligence General fund of knowledge   PROBLEM LIST: Problem List/Patient Goals Date to be addressed Date deferred Reason deferred Estimated date of resolution  "need help with long term facility" 09/18/15           "need anger management" 09/18/15           depression 09/18/15           Substance Abuse 09/18/15                  DISCHARGE CRITERIA:  Ability to meet basic life and health needs Adequate post-discharge living arrangements Improved stabilization in mood, thinking, and/or behavior Verbal commitment to aftercare and medication compliance  PRELIMINARY DISCHARGE PLAN: Attend aftercare/continuing care group Outpatient therapy Return to previous living arrangement  PATIENT/FAMIILY INVOLVEMENT: This treatment plan has been presented to and reviewed with the patient, Duane Salazar.  The patient and family have been given the opportunity to ask questions and make suggestions.  Mart Piggs 09/18/2015, 4:07 PM

## 2015-09-19 LAB — GLUCOSE, CAPILLARY
Glucose-Capillary: 298 mg/dL — ABNORMAL HIGH (ref 65–99)
Glucose-Capillary: 484 mg/dL — ABNORMAL HIGH (ref 65–99)

## 2015-09-19 MED ORDER — HALOPERIDOL 5 MG PO TABS
10.0000 mg | ORAL_TABLET | Freq: Every day | ORAL | Status: DC
  Administered 2015-09-19 – 2015-09-22 (×4): 10 mg via ORAL
  Filled 2015-09-19 (×4): qty 2
  Filled 2015-09-19: qty 14
  Filled 2015-09-19: qty 2

## 2015-09-19 MED ORDER — BENZTROPINE MESYLATE 0.5 MG PO TABS
0.5000 mg | ORAL_TABLET | Freq: Every day | ORAL | Status: DC
  Administered 2015-09-19 – 2015-09-22 (×4): 0.5 mg via ORAL
  Filled 2015-09-19: qty 1
  Filled 2015-09-19: qty 7
  Filled 2015-09-19 (×4): qty 1

## 2015-09-19 MED ORDER — INSULIN ASPART 100 UNIT/ML ~~LOC~~ SOLN: 9.0000 [IU] | Freq: Once | SUBCUTANEOUS | Status: DC

## 2015-09-19 MED ORDER — MENTHOL 3 MG MT LOZG: 1.0000 | LOZENGE | OROMUCOSAL | Status: DC | PRN

## 2015-09-19 MED ORDER — LIDOCAINE-PRILOCAINE 2.5-2.5 % EX CREA
TOPICAL_CREAM | Freq: Once | CUTANEOUS | Status: AC
  Administered 2015-09-22: 12:00:00 via TOPICAL
  Filled 2015-09-19: qty 5

## 2015-09-19 MED ORDER — BIOTENE DRY MOUTH MT LIQD
15.0000 mL | OROMUCOSAL | Status: DC | PRN
  Filled 2015-09-19: qty 15

## 2015-09-19 MED ORDER — DIPHENHYDRAMINE HCL 25 MG PO CAPS
25.0000 mg | ORAL_CAPSULE | Freq: Four times a day (QID) | ORAL | Status: DC | PRN
  Administered 2015-09-19 – 2015-09-21 (×5): 25 mg via ORAL
  Filled 2015-09-19 (×5): qty 1

## 2015-09-19 MED ORDER — HYDROCERIN EX CREA
TOPICAL_CREAM | Freq: Two times a day (BID) | CUTANEOUS | Status: DC
  Administered 2015-09-19 – 2015-09-23 (×6): via TOPICAL
  Filled 2015-09-19: qty 113

## 2015-09-19 MED ORDER — HALOPERIDOL DECANOATE 100 MG/ML IM SOLN
100.0000 mg | INTRAMUSCULAR | Status: DC
  Administered 2015-09-22: 100 mg via INTRAMUSCULAR
  Filled 2015-09-19: qty 1

## 2015-09-19 NOTE — BHH Group Notes (Signed)
[place] LCSW Group Therapy   09/19/2015 1:26 PM  Type of Therapy: Group Therapy  Participation Level:  Active  Participation Quality:  Attentive  Affect:  Flat  Cognitive:  Oriented  Insight:  Limited  Engagement in Therapy:  Engaged  Modes of Intervention:  Discussion and Socialization  Summary of Progress/Problems: Chaplain was here to lead a group on themes of hope and/or courage.  Pt spoke about finding purpose in staying focused on his goals and helping others.  Duane Salazar 09/19/2015 1:26 PM

## 2015-09-19 NOTE — Progress Notes (Signed)
DAR: Patient is safe on the unit. Medications administered as prescribed. Continue to monitor for safety and medication effectiveness. Q 15 minute checks continue throughout the night.

## 2015-09-19 NOTE — Progress Notes (Signed)
[facility] Group Notes:  (Nursing/MHT/Case Management/Adjunct)  Date:  09/19/2015  Time:  9:17 PM  Type of Therapy:  Psychoeducational Skills  Participation Level:  Active  Participation Quality:  Appropriate  Affect:  Appropriate  Cognitive:  Appropriate  Insight:  Appropriate  Engagement in Group:  Engaged  Modes of Intervention:  Education  Summary of Progress/Problems: Patient described his day as having been "all right" since he had a good talk with his family and because he had "no arguments". In terms of the theme of the day, his relapse prevention will be to change the people he is around along with changing his environment.   Archie Balboa S 09/19/2015, 9:17 PM

## 2015-09-19 NOTE — Progress Notes (Signed)
Inpatient Diabetes Program Recommendations  AACE/ADA: New Consensus Statement on Inpatient Glycemic Control (2015)  Target Ranges:  Prepandial:   less than 140 mg/dL      Peak postprandial:   less than 180 mg/dL (1-2 hours)      Critically ill patients:  140 - 180 mg/dL   Review of Glycemic Control  Diabetes history: DM2 Outpatient Diabetes medications: metformin 500 mg bid,  Current orders for Inpatient glycemic control: Novolog resistant tidwc and hs  Results for LEGACY, RAFFETTO (MRN [id number]) as of 09/22/2015 10:04  Ref. Range 09/21/2015 05:59 09/21/2015 11:45 09/21/2015 17:15 09/21/2015 20:26 09/22/2015 05:50  Glucose-Capillary Latest Ref Range: 65-99 mg/dL 286 (H) 346 (H) 363 (H) 493 (H) 328 (H)    Inpatient Diabetes Program Recommendations:    Consider addition of Lantus 20 units Q24H Add Novolog 4 units tidwc for meal coverage insulin. F/U with PCP for management of DM at discharge.  Will continue to follow. Thank you. Lorenda Peck, RD, LDN, CDE Inpatient Diabetes Coordinator [phone]

## 2015-09-19 NOTE — Progress Notes (Signed)
Duane Salazar's blood glucose level tonight is >400. On call provider notified. New orders given. Will continue to monitor. I discussed with Duane Salazar the need to monitor what he is eating throughout the day while he is admitted on the unit as he may have access to certain foods not considered ADA approved. He verbalized understanding.

## 2015-09-19 NOTE — BHH Group Notes (Signed)
[facility] LCSW Aftercare Discharge Planning Group Note   09/19/2015 1:26 PM  Participation Quality:  Invited. Chose not to attend.   Georga Kaufmann

## 2015-09-19 NOTE — Progress Notes (Signed)
[facility] MD Progress Note  09/19/2015 11:32 AM Duane Salazar  MRN:  [id number] Subjective:  Patient states " I feel better today. "   Objective:Duane Salazar is a 40 y.o. AA male, who is married , lives in [place] with his wife, has a hx of Schizoaffective disorder, polysubstance abuse , pituitary macroadenoma , DM , who presented to [facility] IVC ed per [place]. Patient seen and chart reviewed.Discussed patient with treatment team.  Pt today seen as calm , reports his depressive sx are improving. Pt reports he is no longer having HI towards his wife , his irritability is improving. Pt tolerating medications well. Pt denies any ADRs. Will continue treatment.     Principal Problem: Schizoaffective disorder, depressive type ([place]) Diagnosis:   Patient Active Problem List   Diagnosis Date Noted  . Alcohol use disorder, mild, abuse [F10.10] 09/18/2015  . Cannabis use disorder, mild, abuse [F12.10] 09/18/2015  . Opioid use disorder, moderate, dependence ([place]) [F11.20] 09/18/2015  . Tobacco use disorder [F17.200] 09/18/2015  . Cocaine use disorder, severe, dependence ([place]) [F14.20] 09/16/2015  . Schizoaffective disorder, depressive type ([place]) [F25.1]   . 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 Chronic pain syndrome [G89.4] 03/11/2015  . 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 Chronic headache [R51] 01/02/2015  . Right maxillary sinusitis, chronic [J32.0] 01/02/2015  . 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 HTN (hypertension) [I10] 10/21/2014  . Morbid obesity with BMI of 40.0-44.9, adult ([place]) [E66.01, Z68.41] 10/21/2014  . Sleep apnea [G47.30]   . 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 Arthritis [M19.90]   . DM2 (diabetes mellitus, type 2) (HCC) [E11.9]    Total Time spent with patient: 25 minutes  Past Psychiatric History: Pt has a hx of schizoaffective do as well as polysubstance abuse. Pt was in a rehab facility 2 weeks ago. Pt reports being  admitted at [facility] in the past. Pt denies hx of suicide attempts  Past Medical History:  Past Medical History  Diagnosis Date  . Pituitary macroadenoma ([place]) 2004  . Hypertension Dx 2002  . 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 Sleep apnea 1995    on CPAP  . Arthritis Dx 2002  . Diabetes type 2, controlled ([place]) 2010  . AKI (acute kidney injury) ([place]) 03/21/2015  . Schizophrenia ([place])   . Schizo affective schizophrenia ([place]) 1995  . Seizures ([place])   . Headache     Past Surgical History  Procedure Laterality Date  . Pituitary surgery  2005 & 2012  . Skin biopsy     Family History:  Family History  Problem Relation Age of Onset  . Cancer Maternal Uncle   . Alcoholism Maternal Uncle   . Cancer Maternal Grandmother   . Heart disease Neg Hx   . Hypertension Mother   . Diabetes Mother   . Heart Problems Mother    Family Psychiatric  History: Pt reports that his uncle has hx of alcoholism. Pt denied hx of mental illness, suicide in family. Social History: Pt is married since the past 85 years , lives with wife in [place] , is unemployed . Pt has a hx of legal problems - was in prison for armed robbery a year ago History  Alcohol Use  . 1.2 oz/week  . 2 Cans of beer, 0 Standard drinks or equivalent per week    Comment: 2 40s a day     History  Drug Use  . Yes  . Special: Cocaine, Marijuana, Heroin  Comment: Reports used "last night about 1800"  (09/14/15)    Social History   Social History  . Marital Status: Married    Spouse Name: N/A  . Number of Children: 2  . Years of Education: GED   Social History Main Topics  . Smoking status: Current Every Day Smoker -- 1.00 packs/day for 20 years    Types: Cigarettes  . Smokeless tobacco: Never Used     Comment: Smoking .5 ppd  . Alcohol Use: 1.2 oz/week    2 Cans of beer, 0 Standard drinks or equivalent per week     Comment: 2 40s a day  . Drug Use: Yes    Special: Cocaine, Marijuana, Heroin     Comment: Reports used "last  night about 1800"  (09/14/15)  . Sexual Activity: Yes   Other Topics Concern  . None   Social History Narrative   Lives with mom.   Incarcerated for 22 months in [place], [place]. From 2014-09/2014   Additional Social History:                         Sleep: Fair  Appetite:  Fair  Current Medications: Current Facility-Administered Medications  Medication Dose Route Frequency Provider Last Rate Last Dose  . acetaminophen (TYLENOL) tablet 650 mg  650 mg Oral Q4H PRN Duane Gant, NP      . alum & mag hydroxide-simeth (MAALOX/MYLANTA) 200-200-20 MG/5ML suspension 30 mL  30 mL Oral PRN Duane Gant, NP      . atorvastatin (LIPITOR) tablet 40 mg  40 mg Oral Daily Duane Gant, NP   40 mg at 09/19/15 0834  . benztropine (COGENTIN) tablet 0.5 mg  0.5 mg Oral QHS Duane Tomich, MD      . cloNIDine (CATAPRES) tablet 0.2 mg  0.2 mg Oral BID Duane Gant, NP   0.2 mg at 09/19/15 0834  . gabapentin (NEURONTIN) capsule 600 mg  600 mg Oral TID Duane Gant, NP   600 mg at 09/19/15 0834  . haloperidol (HALDOL) tablet 10 mg  10 mg Oral QHS Duane Glaza, MD      . ibuprofen (ADVIL,MOTRIN) tablet 600 mg  600 mg Oral Q8H PRN Duane Gant, NP      . insulin aspart (novoLOG) injection 0-9 Units  0-9 Units Subcutaneous TID WC Duane Gant, NP   5 Units at 09/19/15 [phone]  . lamoTRIgine (LAMICTAL) tablet 25 mg  25 mg Oral Daily Duane Alert, MD   25 mg at 09/19/15 0835  . LORazepam (ATIVAN) tablet 1 mg  1 mg Oral Q6H PRN Duane Alert, MD       Or  . LORazepam (ATIVAN) injection 1 mg  1 mg Intramuscular Q6H PRN Duane Alert, MD      . losartan (COZAAR) tablet 50 mg  50 mg Oral Daily Duane Gant, NP   50 mg at 09/19/15 0834  . metFORMIN (GLUCOPHAGE) tablet 500 mg  500 mg Oral BID PC Duane Gant, NP   500 mg at 09/19/15 0836  . nicotine (NICODERM CQ - dosed in mg/24 hours) patch 21 mg  21 mg Transdermal Daily Duane Alert, MD   21 mg at  09/19/15 0840  . OLANZapine (ZYPREXA) tablet 5 mg  5 mg Oral Q6H PRN Duane Alert, MD       Or  . OLANZapine (ZYPREXA) injection 5 mg  5 mg Intramuscular Q6H PRN Duane Alert, MD      .  ondansetron (ZOFRAN) tablet 4 mg  4 mg Oral Q8H PRN Duane Gant, NP      . sertraline (ZOLOFT) tablet 150 mg  150 mg Oral Daily Duane Gant, NP   150 mg at 09/19/15 0834  . traZODone (DESYREL) tablet 100 mg  100 mg Oral QHS Duane Gant, NP   100 mg at 09/18/15 2255  . verapamil (CALAN-SR) CR tablet 240 mg  240 mg Oral Daily Duane Gant, NP   240 mg at 09/19/15 1028    Lab Results:  Results for orders placed or performed during the hospital encounter of 09/18/15 (from the past 48 hour(s))  Glucose, capillary     Status: Abnormal   Collection Time: 09/18/15  4:47 PM  Result Value Ref Range   Glucose-Capillary 347 (H) 65 - 99 mg/dL    Physical Findings: AIMS: Facial and Oral Movements Muscles of Facial Expression: None, normal Lips and Perioral Area: None, normal Jaw: None, normal Tongue: None, normal,Extremity Movements Upper (arms, wrists, hands, fingers): None, normal Lower (legs, knees, ankles, toes): None, normal, Trunk Movements Neck, shoulders, hips: None, normal, Overall Severity Severity of abnormal movements (highest score from questions above): None, normal Incapacitation due to abnormal movements: None, normal Patient's awareness of abnormal movements (rate only patient's report): No Awareness, Dental Status Current problems with teeth and/or dentures?: No Does patient usually wear dentures?: No  CIWA:  CIWA-Ar Total: 3 COWS:  COWS Total Score: 0  Musculoskeletal: Strength & Muscle Tone: within normal limits Gait & Station: normal Patient leans: N/A  Psychiatric Specialty Exam: Review of Systems  Psychiatric/Behavioral: Positive for depression. The patient is nervous/anxious.   All other systems reviewed and are negative.   Blood pressure 119/84,  pulse 78, temperature 97.7 F (36.5 C), temperature source Oral, resp. rate 20, height 6\' 3"  (1.905 m), weight 127.461 kg (281 lb), SpO2 99 %.Body mass index is 35.12 kg/(m^2).  General Appearance: Fairly Groomed  Engineer, water::  Fair  Speech:  Clear and Coherent  Volume:  Normal  Mood:  Anxious, Depressed and Irritable  Affect:  Appropriate  Thought Process:  Goal Directed  Orientation:  Full (Time, Place, and Person)  Thought Content:  Rumination  Suicidal Thoughts:  No  Homicidal Thoughts:  No  Memory:  Immediate;   Fair Recent;   Fair Remote;   Fair  Judgement:  Impaired  Insight:  Shallow  Psychomotor Activity:  Restlessness  Concentration:  Poor  Recall:  [employer] of Knowledge:Fair  Language: Fair  Akathisia:  No  Handed:  Right  AIMS (if indicated):     Assets:  Communication Skills  ADL's:  Intact  Cognition: WNL  Sleep:  Number of Hours: 6.75   Treatment Plan Summary: Patient is a 65 y old AAM with hx of schizoaffective do, polysubstance abuse - who presented to [facility] , after being IVCed by [facility] for SI/HI. Pt tolerating his medications well. will continue to need inpatient stabilization.  Daily contact with patient to assess and evaluate symptoms and progress in treatment and Medication management Will increase Haldol to 10  mg po qhs for psychosis/mood lability. Taper off Zyprexa for lack of efficacy. Will continue Zoloft 150 mg po daily for affective sx. Will continue Lamictal 25 mg po daily for mood lability. Will continue Trazodone 100 mg po qhs for sleep. Will make available PRN medications as per agitation protocol. Will continue Home medications where indicated. Diabetic consult/CBG monitoring /Carb modified diet . Will continue to monitor  vitals ,medication compliance and treatment side effects while patient is here.  Will monitor for medical issues as well as call consult as needed.  Reviewed labs UDS- pos for cocaine, Hba1c- 01/02/15- 6.7 , cbc- wnl,  CMP - Na+ low , BAL ,5, CT scan head - no acute abnormalities , TSH - wnl , pending lipid panel. CSW will start working on disposition.  Patient to participate in therapeutic milieu .   Kassi Esteve MD 09/19/2015, 11:32 AM

## 2015-09-19 NOTE — Progress Notes (Signed)
DAR NOTE: Patient presents with anxious affect and depressed mood.  Denies pain, auditory and visual hallucinations.  Rates depression at 0, hopelessness at 0, and anxiety at 0.  Describes energy level as low ans concentration as good.  Maintained on routine safety checks.  Medications given as prescribed.  Support and encouragement offered as needed.  Attended group and participated.  States goal for today is "stay focused."  Patient observed socializing with peers in the dayroom.  Offered no complaint.  No signs of hypo/hyperglycemic reaction noted.

## 2015-09-19 NOTE — Tx Team (Signed)
Interdisciplinary Treatment Plan Update (Adult)  Date:  09/19/2015   Time Reviewed:  8:23 AM   Progress in Treatment: Attending groups: Yes. Participating in groups:  Yes. Taking medication as prescribed:  Yes. Tolerating medication:  Yes. Family/Significant other contact made:  Yes, [employer] worker Patient understands diagnosis:  Yes  As evidenced by seeking help with SI and HI Discussing patient identified problems/goals with staff:  Yes, see initial care plan. Medical problems stabilized or resolved:  Yes. Denies suicidal/homicidal ideation: Yes. Issues/concerns per patient self-inventory:  No. Other:  New problem(s) identified:  Discharge Plan or Barriers: see below  Reason for Continuation of Hospitalization: Depression Hallucinations Medication stabilization Other; describe Mood instability  Aggression   Comments:  Pt brought in by [employer], with IVC papers. Pt stated "me and my wife got in an argument last night. I had thoughts of cutting my wrists but then I had thoughts of hurting her." Patient is a 41 y old AAM with hx of schizoaffective do, polysubstance abuse - who presented to [facility] , after being IVCed by [facility] for SI/HI. Pt tolerating his medications well. will continue to need inpatient stabilization.  Daily contact with patient to assess and evaluate symptoms and progress in treatment and Medication management Will increase Haldol to 10 mg po qhs for psychosis/mood lability. Taper off Zyprexa for lack of efficacy. Will continue Zoloft 150 mg po daily for affective sx. Will continue Lamictal 25 mg po daily for mood lability. Will continue Trazodone 100 mg po qhs for sleep.  Estimated length of stay: 3-5 days  New goal(s):  Review of initial/current patient goals per problem list:   Review of initial/current patient goals per problem list:  1. Goal(s): Patient will participate in aftercare plan   Met: Yes   Target date: 3-5 days post admission date   As  evidenced by: Patient will participate within aftercare plan AEB aftercare provider and housing plan at discharge being identified. 09/19/15:  Plans on going to the [facility], following up with his workers there and at [employer]  2. Goal (s): Patient will exhibit decreased depressive symptoms and suicidal ideations.   Met: No   Target date: 3-5 days post admission date   As evidenced by: Patient will utilize self rating of depression at 3 or below and demonstrate decreased signs of depression or be deemed stable for discharge by MD. 09/19/15:  Rates his depression a 6 today.             6. Goal (s): Patient will demonstrate decreased signs of mania  * Met: No  * Target date: 3-5 days post admission date  * As evidenced by: Patient demonstrate decreased signs of mania AEB decreased mood instability and return to baseline functioning 1/20/117:  Pt identifies that his mood has not been stable, and he needs help with that.      Attendees: Patient:  09/19/2015 8:23 AM   Family:   09/19/2015 8:23 AM   Physician:  Ursula Alert, MD 09/19/2015 8:23 AM   Nursing:   Hedy Jacob, RN 09/19/2015 8:23 AM   CSW:    Roque Lias, LCSW   09/19/2015 8:23 AM   Other:  09/19/2015 8:23 AM   Other:   09/19/2015 8:23 AM   Other:  Lars Pinks, Nurse CM 09/19/2015 8:23 AM   Other:   09/19/2015 8:23 AM   Other:  Norberto Sorenson, Malcolm  09/19/2015 8:23 AM   Other:  09/19/2015 8:23 AM   Other:  09/19/2015 8:23 AM  Other:  09/19/2015 8:23 AM   Other:  09/19/2015 8:23 AM   Other:  09/19/2015 8:23 AM   Other:   09/19/2015 8:23 AM    Scribe for Treatment Team:   Trish Mage, 09/19/2015 8:23 AM

## 2015-09-20 LAB — GLUCOSE, CAPILLARY
Glucose-Capillary: 253 mg/dL — ABNORMAL HIGH (ref 65–99)
Glucose-Capillary: 276 mg/dL — ABNORMAL HIGH (ref 65–99)
Glucose-Capillary: 293 mg/dL — ABNORMAL HIGH (ref 65–99)
Glucose-Capillary: 302 mg/dL — ABNORMAL HIGH (ref 65–99)
Glucose-Capillary: 257 mg/dL — ABNORMAL HIGH (ref 65–99)
Glucose-Capillary: 389 mg/dL — ABNORMAL HIGH (ref 65–99)
Glucose-Capillary: 405 mg/dL — ABNORMAL HIGH (ref 65–99)

## 2015-09-20 MED ORDER — INSULIN ASPART 100 UNIT/ML ~~LOC~~ SOLN
0.0000 [IU] | Freq: Every day | SUBCUTANEOUS | Status: DC
  Administered 2015-09-20: 5 [IU] via SUBCUTANEOUS

## 2015-09-20 MED ORDER — INSULIN ASPART 100 UNIT/ML ~~LOC~~ SOLN
0.0000 [IU] | Freq: Three times a day (TID) | SUBCUTANEOUS | Status: DC
  Administered 2015-09-21: 11 [IU] via SUBCUTANEOUS
  Administered 2015-09-21 – 2015-09-22 (×4): 15 [IU] via SUBCUTANEOUS
  Administered 2015-09-22 – 2015-09-23 (×3): 11 [IU] via SUBCUTANEOUS

## 2015-09-20 NOTE — Progress Notes (Signed)
Psychoeducational Group Note  Date:  09/20/2015 Time: 2130  Group Topic/Focus:  Wrap-Up Group:   The focus of this group is to help patients review their daily goal of treatment and discuss progress on daily workbooks.  Participation Level: Did Not Attend  Participation Quality:  Not Applicable  Affect:  Not Applicable  Cognitive:  Not Applicable  Insight:  Not Applicable  Engagement in Group: Not Applicable  Additional Comments:  The patient did not attend group this evening since he was asleep in his room.   Archie Balboa S 09/20/2015, 9:29 PM

## 2015-09-20 NOTE — Progress Notes (Signed)
DAR NOTE: Patient presents with anxious affect and depressed mood.  Denies auditory and visual hallucinations.  Rates depression at 0, hopelessness at 0, and anxiety at 0.  Describes energy level as normal and concentration as good.  Maintained on routine safety checks.  Medications given as prescribed.  Support and encouragement offered as needed.  Attended group and participated.   Patient observed socializing with peers in the dayroom.  Received Tylenol 650 mg for complain of foot pain with good effect.

## 2015-09-20 NOTE — Progress Notes (Signed)
Patient ID: Duane Salazar, male   DOB: 10/24/1975, 40 y.o.   MRN: [id number] [facility] MD Progress Note  09/20/2015 4:37 PM Duane Salazar  MRN:  [id number] Subjective:  Patient states, " I feel better today.  I feel less angry towards my wife.  I even talked with my family."  Objective:Duane Salazar is a 40 y.o. AA male, who is married , lives in [place] with his wife, has a hx of Schizoaffective disorder, polysubstance abuse , pituitary macroadenoma , DM , who presented to [facility] IV[facility] ed per [place]. Patient seen and chart reviewed.Discussed patient with treatment team.  Pt today seen as calm , reports his depressive sx are improving. Pt reports he is no longer having HI towards his wife , his irritability is improving.  He states that he will go to [facility] for his outpatient treatments. Pt tolerating medications well. Pt denies any ADRs. Will continue treatment.  Principal Problem: Schizoaffective disorder, depressive type ([place]) Diagnosis:   Patient Active Problem List   Diagnosis Date Noted  . Alcohol use disorder, mild, abuse [F10.10] 09/18/2015  . Cannabis use disorder, mild, abuse [F12.10] 09/18/2015  . Opioid use disorder, moderate, dependence ([place]) [F11.20] 09/18/2015  . Tobacco use disorder [F17.200] 09/18/2015  . Cocaine use disorder, severe, dependence ([place]) [F14.20] 09/16/2015  . Schizoaffective disorder, depressive type ([place]) [F25.1]   . 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 Chronic pain syndrome [G89.4] 03/11/2015  . 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 Chronic headache [R51] 01/02/2015  . Right maxillary sinusitis, chronic [J32.0] 01/02/2015  . 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 HTN (hypertension) [I10] 10/21/2014  . Morbid obesity with BMI of 40.0-44.9, adult ([place]) [E66.01, Z68.41] 10/21/2014  . Sleep apnea [G47.30]   . 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 Arthritis [M19.90]   . DM2 (diabetes mellitus, type 2) (HCC) [E11.9]     Total Time spent with patient: 25 minutes  Past Psychiatric History: Pt has a hx of schizoaffective do as well as polysubstance abuse. Pt was in a rehab facility 2 weeks ago. Pt reports being admitted at [facility] in the past. Pt denies hx of suicide attempts  Past Medical History:  Past Medical History  Diagnosis Date  . Pituitary macroadenoma ([place]) 2004  . Hypertension Dx 2002  . 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[place]) 2004  . Sleep apnea 1995    on CPAP  . Arthritis Dx 2002  . Diabetes type 2, controlled ([place]) 2010  . AKI (acute kidney injury) ([place]) 03/21/2015  . Schizophrenia ([place])   . Schizo affective schizophrenia ([place]) 1995  . Seizures ([place])   . Headache     Past Surgical History  Procedure Laterality Date  . Pituitary surgery  2005 & 2012  . Skin biopsy     Family History:  Family History  Problem Relation Age of Onset  . Cancer Maternal Uncle   . Alcoholism Maternal Uncle   . Cancer Maternal Grandmother   . Heart disease Neg Hx   . Hypertension Mother   . Diabetes Mother   . Heart Problems Mother    Family Psychiatric  History: Pt reports that his uncle has hx of alcoholism. Pt denied hx of mental illness, suicide in family. Social History: Pt is married since the past 26 years , lives with wife in [place] , is unemployed . Pt has a hx of legal problems - was in prison for armed robbery a year ago History  Alcohol Use  . 1.2 oz/week  .  2 Cans of beer, 0 Standard drinks or equivalent per week    Comment: 2 40s a day     History  Drug Use  . Yes  . Special: Cocaine, Marijuana, Heroin    Comment: Reports used "last night about 1800"  (09/14/15)    Social History   Social History  . Marital Status: Married    Spouse Name: N/A  . Number of Children: 2  . Years of Education: GED   Social History Main Topics  . Smoking status: Current Every Day Smoker -- 1.00 packs/day for 20 years    Types: Cigarettes  . Smokeless tobacco: Never Used     Comment: Smoking .5 ppd  . Alcohol  Use: 1.2 oz/week    2 Cans of beer, 0 Standard drinks or equivalent per week     Comment: 2 40s a day  . Drug Use: Yes    Special: Cocaine, Marijuana, Heroin     Comment: Reports used "last night about 1800"  (09/14/15)  . Sexual Activity: Yes   Other Topics Concern  . None   Social History Narrative   Lives with mom.   Incarcerated for 22 months in [place], [place]. From 2014-09/2014   Additional Social History:       Sleep: Fair  Appetite:  Fair  Current Medications: Current Facility-Administered Medications  Medication Dose Route Frequency Provider Last Rate Last Dose  . acetaminophen (TYLENOL) tablet 650 mg  650 mg Oral Q4H PRN Delfin Gant, NP      . alum & mag hydroxide-simeth (MAALOX/MYLANTA) 200-200-20 MG/5ML suspension 30 mL  30 mL Oral PRN Delfin Gant, NP      . antiseptic oral rinse (BIOTENE) solution 15 mL  15 mL Mouth Rinse PRN Saramma Eappen, MD      . atorvastatin (LIPITOR) tablet 40 mg  40 mg Oral Daily Delfin Gant, NP   40 mg at 09/20/15 0813  . benztropine (COGENTIN) tablet 0.5 mg  0.5 mg Oral QHS Ursula Alert, MD   0.5 mg at 09/19/15 2118  . cloNIDine (CATAPRES) tablet 0.2 mg  0.2 mg Oral BID Delfin Gant, NP   0.2 mg at 09/20/15 0813  . diphenhydrAMINE (BENADRYL) capsule 25 mg  25 mg Oral Q6H PRN Ursula Alert, MD   25 mg at 09/19/15 2118  . gabapentin (NEURONTIN) capsule 600 mg  600 mg Oral TID Delfin Gant, NP   600 mg at 09/20/15 1158  . haloperidol (HALDOL) tablet 10 mg  10 mg Oral QHS Ursula Alert, MD   10 mg at 09/19/15 2118  . [START ON 09/22/2015] haloperidol decanoate (HALDOL DECANOATE) 100 MG/ML injection 100 mg  100 mg Intramuscular Q28 days Ursula Alert, MD      . hydrocerin (EUCERIN) cream   Topical BID Ursula Alert, MD      . ibuprofen (ADVIL,MOTRIN) tablet 600 mg  600 mg Oral Q8H PRN Delfin Gant, NP      . insulin aspart (novoLOG) injection 0-9 Units  0-9 Units Subcutaneous TID WC Delfin Gant, NP    5 Units at 09/20/15 1210  . insulin aspart (novoLOG) injection 9 Units  9 Units Subcutaneous Once Harriet [place]te, NP      . lamoTRIgine (LAMICTAL) tablet 25 mg  25 mg Oral Daily Ursula Alert, MD   25 mg at 09/20/15 0814  . [START ON 09/22/2015] lidocaine-prilocaine (EMLA) cream   Topical Once Ursula Alert, MD      . LORazepam (  ATIVAN) tablet 1 mg  1 mg Oral Q6H PRN Ursula Alert, MD       Or  . LORazepam (ATIVAN) injection 1 mg  1 mg Intramuscular Q6H PRN Ursula Alert, MD      . losartan (COZAAR) tablet 50 mg  50 mg Oral Daily Delfin Gant, NP   50 mg at 09/20/15 0800  . menthol-cetylpyridinium (CEPACOL) lozenge 3 mg  1 lozenge Oral PRN Ursula Alert, MD      . metFORMIN (GLUCOPHAGE) tablet 500 mg  500 mg Oral BID PC Delfin Gant, NP   500 mg at 09/20/15 0814  . nicotine (NICODERM CQ - dosed in mg/24 hours) patch 21 mg  21 mg Transdermal Daily Ursula Alert, MD   21 mg at 09/20/15 [id number]  . OLANZapine (ZYPREXA) tablet 5 mg  5 mg Oral Q6H PRN Ursula Alert, MD       Or  . OLANZapine (ZYPREXA) injection 5 mg  5 mg Intramuscular Q6H PRN Saramma Eappen, MD      . ondansetron (ZOFRAN) tablet 4 mg  4 mg Oral Q8H PRN Delfin Gant, NP      . sertraline (ZOLOFT) tablet 150 mg  150 mg Oral Daily Delfin Gant, NP   150 mg at 09/20/15 0814  . traZODone (DESYREL) tablet 100 mg  100 mg Oral QHS Delfin Gant, NP   100 mg at 09/19/15 2118  . verapamil (CALAN-SR) CR tablet 240 mg  240 mg Oral Daily Delfin Gant, NP   240 mg at 09/20/15 [id number]    Lab Results:  Results for orders placed or performed during the hospital encounter of 09/18/15 (from the past 48 hour(s))  Glucose, capillary     Status: Abnormal   Collection Time: 09/18/15  4:47 PM  Result Value Ref Range   Glucose-Capillary 347 (H) 65 - 99 mg/dL  Glucose, capillary     Status: Abnormal   Collection Time: 09/19/15 11:50 AM  Result Value Ref Range   Glucose-Capillary 298 (H) 65 - 99 mg/dL  Glucose,  capillary     Status: Abnormal   Collection Time: 09/19/15  8:23 PM  Result Value Ref Range   Glucose-Capillary 484 (H) 65 - 99 mg/dL  Glucose, capillary     Status: Abnormal   Collection Time: 09/20/15  5:44 AM  Result Value Ref Range   Glucose-Capillary 302 (H) 65 - 99 mg/dL   Comment 1 Notify RN   Glucose, capillary     Status: Abnormal   Collection Time: 09/20/15 12:00 PM  Result Value Ref Range   Glucose-Capillary 257 (H) 65 - 99 mg/dL    Physical Findings: AIMS: Facial and Oral Movements Muscles of Facial Expression: None, normal Lips and Perioral Area: None, normal Jaw: None, normal Tongue: None, normal,Extremity Movements Upper (arms, wrists, hands, fingers): None, normal Lower (legs, knees, ankles, toes): None, normal, Trunk Movements Neck, shoulders, hips: None, normal, Overall Severity Severity of abnormal movements (highest score from questions above): None, normal Incapacitation due to abnormal movements: None, normal Patient's awareness of abnormal movements (rate only patient's report): No Awareness, Dental Status Current problems with teeth and/or dentures?: No Does patient usually wear dentures?: No  CIWA:  CIWA-Ar Total: 3 COWS:  COWS Total Score: 0  Musculoskeletal: Strength & Muscle Tone: within normal limits Gait & Station: normal Patient leans: N/A  Psychiatric Specialty Exam: Review of Systems  Psychiatric/Behavioral: Positive for depression. The patient is nervous/anxious.   All other systems reviewed and are  negative.   Blood pressure 120/83, pulse 92, temperature 98 F (36.7 C), temperature source Oral, resp. rate 16, height 6\' 3"  (1.905 m), weight 127.461 kg (281 lb), SpO2 99 %.Body mass index is 35.12 kg/(m^2).  General Appearance: Fairly Groomed  Engineer, water::  Fair  Speech:  Clear and Coherent  Volume:  Normal  Mood:  Anxious, Depressed and Irritable  Affect:  Appropriate  Thought Process:  Goal Directed  Orientation:  Full (Time,  Place, and Person)  Thought Content:  Rumination  Suicidal Thoughts:  No  Homicidal Thoughts:  No  Memory:  Immediate;   Fair Recent;   Fair Remote;   Fair  Judgement:  Impaired  Insight:  Shallow  Psychomotor Activity:  Restlessness  Concentration:  Poor  Recall:  [employer] of Knowledge:Fair  Language: Fair  Akathisia:  No  Handed:  Right  AIMS (if indicated):     Assets:  Communication Skills  ADL's:  Intact  Cognition: WNL  Sleep:  Number of Hours: 6.5   Treatment Plan Summary: Patient is a 71 y old AAM with hx of schizoaffective do, polysubstance abuse - who presented to [facility], after being IVCed by [facility] for SI/HI. Pt tolerating his medications well. will continue to need inpatient stabilization.  Daily contact with patient to assess and evaluate symptoms and progress in treatment and Medication management Continue Haldol to 10  mg po qhs for psychosis/mood lability.  Will continue Zoloft 150 mg po daily for affective sx. Will continue Lamictal 25 mg po daily for mood lability. Will continue Trazodone 100 mg po qhs for sleep. Will make available PRN medications as per agitation protocol. Will continue Home medications where indicated. Diabetic consult/CBG monitoring /Carb modified diet . Will continue to monitor vitals ,medication compliance and treatment side effects while patient is here.  Will monitor for medical issues as well as call consult as needed.  labs UDS- pos for cocaine, Hba1c- 01/02/15- 6.7 , cbc- wnl, CMP - Na+ low , BAL ,5, CT scan head - no acute abnormalities , TSH - wnl , pending lipid panel. CSW will start working on disposition.  Patient to participate in therapeutic milieu .   Freda Munro May Agustin  AGNP-BC 09/20/2015, 4:37 PM Agree with NP Progress Note, as above

## 2015-09-20 NOTE — BHH Group Notes (Signed)
[name] Group Notes:  (Nursing/MHT/Case Management/Adjunct)  Date:  09/20/2015  Time:  12:47 PM  Type of Therapy:  Nurse Education  Participation Level:  Active  Participation Quality:  Appropriate and Attentive  Affect:  Appropriate  Cognitive:  Alert and Appropriate  Insight:  Appropriate and Good  Engagement in Group:  Engaged  Modes of Intervention:  Activity, Discussion and Education  Summary of Progress/Problems: Topic was on healthy coping skills. Discussed the importance of surrounding oneself with a good support system. Support systems are there to motivate, encourage, and assist with learning new coping skills that leads to a healthy lifestyle.  Patient was attentive and receptive.    Mart Piggs 09/20/2015, 12:47 PM

## 2015-09-20 NOTE — BHH Group Notes (Signed)
[place] Group Notes:  (Clinical Social Work)  09/20/2015  9:00-9:45AM  Summary of Progress/Problems:   Today's process group involved patients discussing their feelings related to being hospitalized, as well as how they can use their present feelings to create a goal for the day. The patient expressed his primary feeling about being hospitalized is "good because I can feel the healing that is taking place."  He stated he is in the process of preparing himself to be able to go out of the hospital.  Type of Therapy:  Group Therapy - Process  Participation Level:  Active  Participation Quality:  Attentive  Affect:  Blunted and Depressed  Cognitive:  Appropriate  Insight:  Developing/Improving  Engagement in Therapy:  Developing/Improving  Modes of Intervention:  Exploration, Discussion  Selmer Dominion, LCSW 09/20/2015, 1:05 PM

## 2015-09-21 LAB — GLUCOSE, CAPILLARY
Glucose-Capillary: 286 mg/dL — ABNORMAL HIGH (ref 65–99)
Glucose-Capillary: 346 mg/dL — ABNORMAL HIGH (ref 65–99)
Glucose-Capillary: 363 mg/dL — ABNORMAL HIGH (ref 65–99)
Glucose-Capillary: 493 mg/dL — ABNORMAL HIGH (ref 65–99)

## 2015-09-21 MED ORDER — INSULIN ASPART 100 UNIT/ML ~~LOC~~ SOLN
6.0000 [IU] | Freq: Once | SUBCUTANEOUS | Status: AC
  Administered 2015-09-21: 6 [IU] via SUBCUTANEOUS

## 2015-09-21 NOTE — Progress Notes (Signed)
Patient ID: Duane Salazar, male   DOB: 09/24/1975, 40 y.o.   MRN: [id number] D: Patient stated he is doing well and had a good day. Pt thought process is organized and behavior is appropriate. Pt denies SI/HI/AVH and pain. Evening CBG was 493. NP notified. On time novolog of 6 unit given. Pt attended evening wrap up group and engaged in discussion. Pt denies any needs or concerns. Cooperative with assessment.    A: Met with pt 1:1. Medications administered as prescribed. Writer encouraged pt to discuss feelings. Pt educated on diabetic foods to decrease blood sugar level.   R: Patient is safe on the unit and complaint with medications.

## 2015-09-21 NOTE — Progress Notes (Signed)
D: Pt is very flat, isolative and withdrawn to room, making minimal eye contact. Pt denies any form of depression, anxiety, pain, SI, HI and AVH; he states, "I went to be early because I was a little tired." Pt remained nonviolent and calm A: Medications offered as prescribed.  Support, encouragement, and safe environment provided.  15-minute safety checks continue. R: Pt was med compliant.  Pt did not attend wrap up group meeting. Safety checks continue

## 2015-09-21 NOTE — BHH Group Notes (Signed)
[place] Group Notes:  (Clinical Social Work)  09/21/2015  9:15AM-10:20AM  Summary of Progress/Problems:  The main focus of today's process group was to listen to a variety of genres of music and to identify that different types of music provoke different responses.  The patient then was able to identify personally what was soothing, energizing, depressing, inspiring.   The patient expressed understanding of concepts, as well as knowledge of how each type of music affected him and how this can be used at home as a wellness/recovery tool.  He could not be persuaded by other patients to dance, and appeared very sleepy.  Type of Therapy:  Music Therapy   Participation Level:  Active  Participation Quality:  Drowsy  Affect:  Blunted  Cognitive:  Oriented  Insight:  Engaged  Engagement in Therapy: Improving  Modes of Intervention:   Activity, Exploration  Selmer Dominion, LCSW 09/21/2015 12:58 PM

## 2015-09-21 NOTE — Progress Notes (Signed)
[facility] MD Progress Note  09/21/2015 2:20 PM Trevyon Doud  MRN:  [id number] Subjective:  Patient reports feeling less angry.  Patient also spoke over phone and had a good conversation with his wife.  Objective:Brandell Casini is a 40 y.o. AA male, who is married , lives in [place] with his wife, has a hx of Schizoaffective disorder, polysubstance abuse , pituitary macroadenoma , DM , who presented to [facility] IVC ed per [place]. Patient seen and chart reviewed.Discussed patient with treatment team.  Pt today seen as calm , reports his depressive sx are improving. Pt reports he is no longer having HI towards his wife , his irritability is improving.  He states that he will go to [facility] for his outpatient treatments. Pt tolerating medications well. Pt denies any ADRs. Will continue treatment.  Principal Problem: Schizoaffective disorder, depressive type ([place]) Diagnosis:   Patient Active Problem List   Diagnosis Date Noted  . Alcohol use disorder, mild, abuse [F10.10] 09/18/2015  . Cannabis use disorder, mild, abuse [F12.10] 09/18/2015  . Opioid use disorder, moderate, dependence ([place]) [F11.20] 09/18/2015  . Tobacco use disorder [F17.200] 09/18/2015  . Cocaine use disorder, severe, dependence ([place]) [F14.20] 09/16/2015  . Schizoaffective disorder, depressive type ([place]) [F25.1]   . 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 Chronic pain syndrome [G89.4] 03/11/2015  . 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 Chronic headache [R51] 01/02/2015  . Right maxillary sinusitis, chronic [J32.0] 01/02/2015  . 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 HTN (hypertension) [I10] 10/21/2014  . Morbid obesity with BMI of 40.0-44.9, adult ([place]) [E66.01, Z68.41] 10/21/2014  . Sleep apnea [G47.30]   . 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 Arthritis [M19.90]   . DM2 (diabetes mellitus, type 2) (HCC) [E11.9]    Total Time spent with patient: 25 minutes  Past Psychiatric  History: Pt has a hx of schizoaffective do as well as polysubstance abuse. Pt was in a rehab facility 2 weeks ago. Pt reports being admitted at [facility] in the past. Pt denies hx of suicide attempts  Past Medical History:  Past Medical History  Diagnosis Date  . Pituitary macroadenoma ([place]) 2004  . Hypertension Dx 2002  . 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[place]) 2004  . Sleep apnea 1995    on CPAP  . Arthritis Dx 2002  . Diabetes type 2, controlled ([place]) 2010  . AKI (acute kidney injury) ([place]) 03/21/2015  . Schizophrenia ([place])   . Schizo affective schizophrenia ([place]) 1995  . Seizures ([place])   . Headache     Past Surgical History  Procedure Laterality Date  . Pituitary surgery  2005 & 2012  . Skin biopsy     Family History:  Family History  Problem Relation Age of Onset  . Cancer Maternal Uncle   . Alcoholism Maternal Uncle   . Cancer Maternal Grandmother   . Heart disease Neg Hx   . Hypertension Mother   . Diabetes Mother   . Heart Problems Mother    Family Psychiatric  History: Pt reports that his uncle has hx of alcoholism. Pt denied hx of mental illness, suicide in family. Social History: Pt is married since the past 20 years , lives with wife in [place] , is unemployed . Pt has a hx of legal problems - was in prison for armed robbery a year ago History  Alcohol Use  . 1.2 oz/week  . 2 Cans of beer, 0 Standard drinks or equivalent per week    Comment: 2 40s  a day     History  Drug Use  . Yes  . Special: Cocaine, Marijuana, Heroin    Comment: Reports used "last night about 1800"  (09/14/15)    Social History   Social History  . Marital Status: Married    Spouse Name: N/A  . Number of Children: 2  . Years of Education: GED   Social History Main Topics  . Smoking status: Current Every Day Smoker -- 1.00 packs/day for 20 years    Types: Cigarettes  . Smokeless tobacco: Never Used     Comment: Smoking .5 ppd  . Alcohol Use: 1.2 oz/week    2 Cans of beer, 0 Standard drinks or  equivalent per week     Comment: 2 40s a day  . Drug Use: Yes    Special: Cocaine, Marijuana, Heroin     Comment: Reports used "last night about 1800"  (09/14/15)  . Sexual Activity: Yes   Other Topics Concern  . None   Social History Narrative   Lives with mom.   Incarcerated for 22 months in [place], [place]. From 2014-09/2014   Additional Social History:       Sleep: Fair  Appetite:  Fair  Current Medications: Current Facility-Administered Medications  Medication Dose Route Frequency Provider Last Rate Last Dose  . acetaminophen (TYLENOL) tablet 650 mg  650 mg Oral Q4H PRN Delfin Gant, NP   650 mg at 09/20/15 1723  . alum & mag hydroxide-simeth (MAALOX/MYLANTA) 200-200-20 MG/5ML suspension 30 mL  30 mL Oral PRN Delfin Gant, NP      . antiseptic oral rinse (BIOTENE) solution 15 mL  15 mL Mouth Rinse PRN Saramma Eappen, MD      . atorvastatin (LIPITOR) tablet 40 mg  40 mg Oral Daily Delfin Gant, NP   40 mg at 09/21/15 0828  . benztropine (COGENTIN) tablet 0.5 mg  0.5 mg Oral QHS Ursula Alert, MD   0.5 mg at 09/20/15 2149  . cloNIDine (CATAPRES) tablet 0.2 mg  0.2 mg Oral BID Delfin Gant, NP   0.2 mg at 09/21/15 0828  . diphenhydrAMINE (BENADRYL) capsule 25 mg  25 mg Oral Q6H PRN Ursula Alert, MD   25 mg at 09/20/15 2154  . gabapentin (NEURONTIN) capsule 600 mg  600 mg Oral TID Delfin Gant, NP   600 mg at 09/21/15 1152  . haloperidol (HALDOL) tablet 10 mg  10 mg Oral QHS Ursula Alert, MD   10 mg at 09/20/15 2149  . [START ON 09/22/2015] haloperidol decanoate (HALDOL DECANOATE) 100 MG/ML injection 100 mg  100 mg Intramuscular Q28 days Ursula Alert, MD      . hydrocerin (EUCERIN) cream   Topical BID Ursula Alert, MD      . ibuprofen (ADVIL,MOTRIN) tablet 600 mg  600 mg Oral Q8H PRN Delfin Gant, NP      . insulin aspart (novoLOG) injection 0-20 Units  0-20 Units Subcutaneous TID WC Lurena Nida, NP   15 Units at 09/21/15 1152  .  insulin aspart (novoLOG) injection 0-5 Units  0-5 Units Subcutaneous QHS Lurena Nida, NP   5 Units at 09/20/15 2217  . insulin aspart (novoLOG) injection 9 Units  9 Units Subcutaneous Once Harriet Butte, NP      . lamoTRIgine (LAMICTAL) tablet 25 mg  25 mg Oral Daily Ursula Alert, MD   25 mg at 09/21/15 0827  . [START ON 09/22/2015] lidocaine-prilocaine (EMLA) cream   Topical Once  Ursula Alert, MD      . LORazepam (ATIVAN) tablet 1 mg  1 mg Oral Q6H PRN Ursula Alert, MD       Or  . LORazepam (ATIVAN) injection 1 mg  1 mg Intramuscular Q6H PRN Ursula Alert, MD      . losartan (COZAAR) tablet 50 mg  50 mg Oral Daily Delfin Gant, NP   50 mg at 09/21/15 0828  . menthol-cetylpyridinium (CEPACOL) lozenge 3 mg  1 lozenge Oral PRN Ursula Alert, MD      . metFORMIN (GLUCOPHAGE) tablet 500 mg  500 mg Oral BID PC Delfin Gant, NP   500 mg at 09/21/15 0828  . nicotine (NICODERM CQ - dosed in mg/24 hours) patch 21 mg  21 mg Transdermal Daily Ursula Alert, MD   21 mg at 09/21/15 0828  . OLANZapine (ZYPREXA) tablet 5 mg  5 mg Oral Q6H PRN Ursula Alert, MD       Or  . OLANZapine (ZYPREXA) injection 5 mg  5 mg Intramuscular Q6H PRN Saramma Eappen, MD      . ondansetron (ZOFRAN) tablet 4 mg  4 mg Oral Q8H PRN Delfin Gant, NP      . sertraline (ZOLOFT) tablet 150 mg  150 mg Oral Daily Delfin Gant, NP   150 mg at 09/21/15 0826  . traZODone (DESYREL) tablet 100 mg  100 mg Oral QHS Delfin Gant, NP   100 mg at 09/20/15 2149  . verapamil (CALAN-SR) CR tablet 240 mg  240 mg Oral Daily Delfin Gant, NP   240 mg at 09/21/15 [id number]    Lab Results:  Results for orders placed or performed during the hospital encounter of 09/18/15 (from the past 48 hour(s))  Glucose, capillary     Status: Abnormal   Collection Time: 09/19/15  5:04 PM  Result Value Ref Range   Glucose-Capillary 293 (H) 65 - 99 mg/dL  Glucose, capillary     Status: Abnormal   Collection Time: 09/19/15   8:23 PM  Result Value Ref Range   Glucose-Capillary 484 (H) 65 - 99 mg/dL  Glucose, capillary     Status: Abnormal   Collection Time: 09/20/15  5:44 AM  Result Value Ref Range   Glucose-Capillary 302 (H) 65 - 99 mg/dL   Comment 1 Notify RN   Glucose, capillary     Status: Abnormal   Collection Time: 09/20/15 12:00 PM  Result Value Ref Range   Glucose-Capillary 257 (H) 65 - 99 mg/dL  Glucose, capillary     Status: Abnormal   Collection Time: 09/20/15  5:19 PM  Result Value Ref Range   Glucose-Capillary 389 (H) 65 - 99 mg/dL  Glucose, capillary     Status: Abnormal   Collection Time: 09/20/15  9:37 PM  Result Value Ref Range   Glucose-Capillary 405 (H) 65 - 99 mg/dL   Comment 1 Notify RN   Glucose, capillary     Status: Abnormal   Collection Time: 09/21/15  5:59 AM  Result Value Ref Range   Glucose-Capillary 286 (H) 65 - 99 mg/dL   Comment 1 Notify RN     Physical Findings: AIMS: Facial and Oral Movements Muscles of Facial Expression: None, normal Lips and Perioral Area: None, normal Jaw: None, normal Tongue: None, normal,Extremity Movements Upper (arms, wrists, hands, fingers): None, normal Lower (legs, knees, ankles, toes): None, normal, Trunk Movements Neck, shoulders, hips: None, normal, Overall Severity Severity of abnormal movements (highest score from questions above):  None, normal Incapacitation due to abnormal movements: None, normal Patient's awareness of abnormal movements (rate only patient's report): No Awareness, Dental Status Current problems with teeth and/or dentures?: No Does patient usually wear dentures?: No  CIWA:  CIWA-Ar Total: 3 COWS:  COWS Total Score: 0  Musculoskeletal: Strength & Muscle Tone: within normal limits Gait & Station: normal Patient leans: N/A  Psychiatric Specialty Exam: Review of Systems  Psychiatric/Behavioral: Positive for depression. The patient is nervous/anxious.   All other systems reviewed and are negative.   Blood  pressure 121/70, pulse 76, temperature 97.7 F (36.5 C), temperature source Oral, resp. rate 18, height 6\' 3"  (1.905 m), weight 127.461 kg (281 lb), SpO2 99 %.Body mass index is 35.12 kg/(m^2).  General Appearance: Fairly Groomed  Engineer, water::  Fair  Speech:  Clear and Coherent  Volume:  Normal  Mood:  Anxious, Depressed and Irritable  Affect:  Appropriate  Thought Process:  Goal Directed  Orientation:  Full (Time, Place, and Person)  Thought Content:  Rumination  Suicidal Thoughts:  No  Homicidal Thoughts:  No  Memory:  Immediate;   Fair Recent;   Fair Remote;   Fair  Judgement:  Impaired  Insight:  Shallow  Psychomotor Activity:  Restlessness  Concentration:  Poor  Recall:  [employer] of Knowledge:Fair  Language: Fair  Akathisia:  No  Handed:  Right  AIMS (if indicated):     Assets:  Communication Skills  ADL's:  Intact  Cognition: WNL  Sleep:  Number of Hours: 6.75   Treatment Plan Summary: Patient is a 22 y old AAM with hx of schizoaffective do, polysubstance abuse - who presented to [facility], after being IVCed by [facility] for SI/HI. Pt tolerating his medications well. will continue to need inpatient stabilization.  Daily contact with patient to assess and evaluate symptoms and progress in treatment and Medication management Continue Haldol to 10  mg po qhs for psychosis/mood lability.  Will continue Zoloft 150 mg po daily for affective sx. Will continue Lamictal 25 mg po daily for mood lability. Will continue Trazodone 100 mg po qhs for sleep. Will make available PRN medications as per agitation protocol. Will continue Home medications where indicated. Diabetic consult/CBG monitoring /Carb modified diet . Will continue to monitor vitals ,medication compliance and treatment side effects while patient is here.  Will monitor for medical issues as well as call consult as needed.  labs UDS- pos for cocaine, Hba1c- 01/02/15- 6.7 , cbc- wnl, CMP - Na+ low , BAL ,5, CT scan head  - no acute abnormalities , TSH - wnl , pending lipid panel. CSW will start working on disposition.  Patient to participate in therapeutic milieu .   Freda Munro May Agustin  AGNP-BC 09/21/2015, 2:20 PM Agree with NP Progress Note, as above

## 2015-09-21 NOTE — Progress Notes (Signed)
[place] Group Notes:  (Nursing/MHT/Case Management/Adjunct)  Date:  09/21/2015  Time:  9:09 PM  Type of Therapy:  Psychoeducational Skills  Participation Level:  Minimal  Participation Quality:  Attentive  Affect:  Blunted  Cognitive:  Lacking  Insight:  Improving  Engagement in Group:  Improving  Modes of Intervention:  Education  Summary of Progress/Problems: Patient states that he felt more cheerful and open minded today. In terms of the theme of the day, his support system will be comprised of his Narcotics Anonymous sponsor and some church members.   Archie Balboa S 09/21/2015, 9:09 PM

## 2015-09-21 NOTE — Plan of Care (Signed)
Problem: Alteration in mood & ability to function due to Goal: STG-Patient will attend groups Outcome: Progressing Pt attended and engaged in evening wrap up group

## 2015-09-21 NOTE — Progress Notes (Signed)
DAR NOTE: Pt present with flat affect and depressed mood in the unit. Pt has been isolating himself and has been bed most of the time. Pt denies physical pain, took all his meds as scheduled. As per self inventory, pt had a good night sleep, good appetite, normal energy, and good concentration. Pt rate depression at 0, hopeless ness at 0. Pt's safety ensured with 15 minute and environmental checks. Pt currently denies SI/HI and A/V hallucinations. Pt verbally agrees to seek staff if SI/HI or A/VH occurs and to consult with staff before acting on these thoughts. Will continue POC.

## 2015-09-21 NOTE — [facility] Group Notes (Signed)
[place] Group Notes:  (Nursing/MHT/Case Management/Adjunct)  Date:  09/21/2015  Time:  4:05 PM Type of Therapy:  Psychoeducational Skills  Participation Level:  Active  Participation Quality:  Appropriate  Affect:  Appropriate  Cognitive:  Appropriate  Insight:  Appropriate  Engagement in Group:  Engaged  Modes of Intervention:  Problem-solving  Summary of Progress/Problems: Summary of Progress/Problems: Topic was on leisure and lifestyle changes. Discussed the important of choosing healthy leisure activities. Group encouraged to surround themselves with positive and healthy group/support system when changing to a healthy life style.     Wolfgang Phoenix 09/21/2015, 4:05 PM

## 2015-09-22 DIAGNOSIS — G894 Chronic pain syndrome: Secondary | ICD-10-CM

## 2015-09-22 LAB — GLUCOSE, CAPILLARY
Glucose-Capillary: 328 mg/dL — ABNORMAL HIGH (ref 65–99)
Glucose-Capillary: 316 mg/dL — ABNORMAL HIGH (ref 65–99)
Glucose-Capillary: 297 mg/dL — ABNORMAL HIGH (ref 65–99)
Glucose-Capillary: 199 mg/dL — ABNORMAL HIGH (ref 65–99)

## 2015-09-22 MED ORDER — INSULIN GLARGINE 100 UNIT/ML ~~LOC~~ SOLN
20.0000 [IU] | Freq: Every day | SUBCUTANEOUS | Status: DC
  Administered 2015-09-22: 20 [IU] via SUBCUTANEOUS
  Filled 2015-09-22 (×2): qty 0.2

## 2015-09-22 MED ORDER — INSULIN ASPART 100 UNIT/ML ~~LOC~~ SOLN
4.0000 [IU] | Freq: Three times a day (TID) | SUBCUTANEOUS | Status: DC
  Administered 2015-09-22 – 2015-09-23 (×3): 4 [IU] via SUBCUTANEOUS

## 2015-09-22 NOTE — Plan of Care (Signed)
Problem: Alteration in mood & ability to function due to Goal: STG-Patient will comply with prescribed medication regimen (Patient will comply with prescribed medication regimen)  Outcome: Progressing Pt complaint with medication regime

## 2015-09-22 NOTE — Progress Notes (Signed)
D: Patient states he is sleeping and eating well.  His CBGs continue to be high.  Diabetic consult ordered by MD.  Patient states his depressive symptoms have improved.  He rates his depression, hopelessness and anxiety as a 0.  He denies any SI/HI/AVH.  He denies any withdrawal symptoms.  He is pleasant upon approach.  Patient asked to get number out of his locker and staff complied.  He has been attending groups. A: Continue to monitor medication management and MD orders.  Safety checks completed every 15 minutes per protocol.  Offer support and encouragement as needed. R: Patient is receptive to staff; his behavior is appropriate.

## 2015-09-22 NOTE — Tx Team (Signed)
Interdisciplinary Treatment Plan Update (Adult)  Date:  09/22/2015   Time Reviewed:  4:06 PM   Progress in Treatment: Attending groups: Yes. Participating in groups:  Yes. Taking medication as prescribed:  Yes. Tolerating medication:  Yes. Family/Significant other contact made:  Yes, [employer] worker Patient understands diagnosis:  Yes  As evidenced by seeking help with SI and HI Discussing patient identified problems/goals with staff:  Yes, see initial care plan. Medical problems stabilized or resolved:  Yes. Denies suicidal/homicidal ideation: Yes. Issues/concerns per patient self-inventory:  No. Other:  New problem(s) identified:  Discharge Plan or Barriers: see below  Reason for Continuation of Hospitalization:    Comments:  Pt brought in by [employer], with IVC papers. Pt stated "me and my wife got in an argument last night. I had thoughts of cutting my wrists but then I had thoughts of hurting her." Patient is a 20 y old AAM with hx of schizoaffective do, polysubstance abuse - who presented to [facility] , after being IVCed by [facility] for SI/HI. Pt tolerating his medications well. will continue to need inpatient stabilization.  Daily contact with patient to assess and evaluate symptoms and progress in treatment and Medication management Will increase Haldol to 10 mg po qhs for psychosis/mood lability. Taper off Zyprexa for lack of efficacy. Will continue Zoloft 150 mg po daily for affective sx. Will continue Lamictal 25 mg po daily for mood lability. Will continue Trazodone 100 mg po qhs for sleep.  Estimated length of stay: Likely d/c tomorrow  New goal(s):  Review of initial/current patient goals per problem list:   Review of initial/current patient goals per problem list:  1. Goal(s): Patient will participate in aftercare plan   Met: Yes   Target date: 3-5 days post admission date   As evidenced by: Patient will participate within aftercare plan AEB aftercare provider and  housing plan at discharge being identified. 09/19/15:  Plans on going to the [facility], following up with his workers there and at [employer]  2. Goal (s): Patient will exhibit decreased depressive symptoms and suicidal ideations.   Met: Yes   Target date: 3-5 days post admission date   As evidenced by: Patient will utilize self rating of depression at 3 or below and demonstrate decreased signs of depression or be deemed stable for discharge by MD. 09/19/15:  Rates his depression a 6 today. 09/22/15:  Denies depression today             6. Goal (s): Patient will demonstrate decreased signs of mania  * Met: Yes  * Target date: 3-5 days post admission date  * As evidenced by: Patient demonstrate decreased signs of mania AEB decreased mood instability and return to baseline functioning 1/20/117:  Pt identifies that his mood has not been stable, and he needs help with that. 09/22/15:  States he is no longer angry, and there is no sign of mood instability.      Attendees: Patient:  09/22/2015 4:06 PM   Family:   09/22/2015 4:06 PM   Physician:  Duane Alert, MD 09/22/2015 4:06 PM   Nursing:   Duane Jarred, RN 09/22/2015 4:06 PM   CSW:    Duane Lias, LCSW   09/22/2015 4:06 PM   Other:  09/22/2015 4:06 PM   Other:   09/22/2015 4:06 PM   Other:  Duane Salazar, Nurse CM 09/22/2015 4:06 PM   Other:   09/22/2015 4:06 PM   Other:  Duane Salazar, Duane Salazar  09/22/2015 4:06 PM  Other:  09/22/2015 4:06 PM   Other:  09/22/2015 4:06 PM   Other:  09/22/2015 4:06 PM   Other:  09/22/2015 4:06 PM   Other:  09/22/2015 4:06 PM   Other:   09/22/2015 4:06 PM    Scribe for Treatment Team:   Duane Salazar, 09/22/2015 4:06 PM

## 2015-09-22 NOTE — [facility] Group Notes (Signed)
[place] Group Notes:  (Counselor/Nursing/MHT/Case Management/Adjunct)  09/22/2015 1:15PM  Type of Therapy:  Group Therapy  Participation Level:  Active  Participation Quality:  Appropriate  Affect:  Flat  Cognitive:  Oriented  Insight:  Improving  Engagement in Group:  Limited  Engagement in Therapy:  Limited  Modes of Intervention:  Discussion, Exploration and Socialization  Summary of Progress/Problems: The topic for group was balance in life.  Pt participated in the discussion about when their life was in balance and out of balance and how this feels.  Pt discussed ways to get back in balance and short term goals they can work on to get where they want to be. Duane Salazar was in and out of group a couple times, but engaged while there.  Talked about how well he is feeling, and this is related to his meds, but also to the fact that he and his wife talked and have made up.  He is no longer homicidal towards her.  Furthermore, talked about his faith and how praying to his higher power "delivers me every day."    Trish Mage 09/22/2015 4:03 PM

## 2015-09-22 NOTE — Progress Notes (Signed)
[facility] MD Progress Note  09/22/2015 2:18 PM Duane Salazar  MRN:  [id number] Subjective:  Patient reports " I feel better.'   Objective:Duane Salazar is a 40 y.o. AA male, who is married , lives in [place] with his wife, has a hx of Schizoaffective disorder, polysubstance abuse , pituitary macroadenoma , DM , who presented to [facility] IVC ed per [place]. Patient seen and chart reviewed.Discussed patient with treatment team.  Pt today seen as calm , is less depressed, less anxious. Pt seen as compliant on medications, denies ADRS. Pt per staff - continues to have uncontrolled CBGs.- will reconsult Diabetic coordinator. Will continue treatment.  Principal Problem: Schizoaffective disorder, depressive type ([place]) Diagnosis:   Patient Active Problem List   Diagnosis Date Noted  . Alcohol use disorder, mild, abuse [F10.10] 09/18/2015  . Cannabis use disorder, mild, abuse [F12.10] 09/18/2015  . Opioid use disorder, moderate, dependence ([place]) [F11.20] 09/18/2015  . Tobacco use disorder [F17.200] 09/18/2015  . Cocaine use disorder, severe, dependence ([place]) [F14.20] 09/16/2015  . Schizoaffective disorder, depressive type ([place]) [F25.1]   . 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 Chronic pain syndrome [G89.4] 03/11/2015  . 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 Chronic headache [R51] 01/02/2015  . Right maxillary sinusitis, chronic [J32.0] 01/02/2015  . 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 HTN (hypertension) [I10] 10/21/2014  . Morbid obesity with BMI of 40.0-44.9, adult ([place]) [E66.01, Z68.41] 10/21/2014  . Sleep apnea [G47.30]   . 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 Arthritis [M19.90]   . DM2 (diabetes mellitus, type 2) (HCC) [E11.9]    Total Time spent with patient: 25 minutes  Past Psychiatric History: Pt has a hx of schizoaffective do as well as polysubstance abuse. Pt was in a rehab facility 2 weeks ago. Pt reports being admitted at  [facility] in the past. Pt denies hx of suicide attempts  Past Medical History:  Past Medical History  Diagnosis Date  . Pituitary macroadenoma ([place]) 2004  . Hypertension Dx 2002  . 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[place]) 2004  . Sleep apnea 1995    on CPAP  . Arthritis Dx 2002  . Diabetes type 2, controlled ([place]) 2010  . AKI (acute kidney injury) ([place]) 03/21/2015  . Schizophrenia ([place])   . Schizo affective schizophrenia ([place]) 1995  . Seizures ([place])   . Headache     Past Surgical History  Procedure Laterality Date  . Pituitary surgery  2005 & 2012  . Skin biopsy     Family History:  Family History  Problem Relation Age of Onset  . Cancer Maternal Uncle   . Alcoholism Maternal Uncle   . Cancer Maternal Grandmother   . Heart disease Neg Hx   . Hypertension Mother   . Diabetes Mother   . Heart Problems Mother    Family Psychiatric  History: Pt reports that his uncle has hx of alcoholism. Pt denied hx of mental illness, suicide in family. Social History: Pt is married since the past 74 years , lives with wife in [place] , is unemployed . Pt has a hx of legal problems - was in prison for armed robbery a year ago History  Alcohol Use  . 1.2 oz/week  . 2 Cans of beer, 0 Standard drinks or equivalent per week    Comment: 2 40s a day     History  Drug Use  . Yes  . Special: Cocaine, Marijuana, Heroin    Comment: Reports used "last night about 1800"  (  09/14/15)    Social History   Social History  . Marital Status: Married    Spouse Name: N/A  . Number of Children: 2  . Years of Education: GED   Social History Main Topics  . Smoking status: Current Every Day Smoker -- 1.00 packs/day for 20 years    Types: Cigarettes  . Smokeless tobacco: Never Used     Comment: Smoking .5 ppd  . Alcohol Use: 1.2 oz/week    2 Cans of beer, 0 Standard drinks or equivalent per week     Comment: 2 40s a day  . Drug Use: Yes    Special: Cocaine, Marijuana, Heroin     Comment: Reports used "last night about  1800"  (09/14/15)  . Sexual Activity: Yes   Other Topics Concern  . None   Social History Narrative   Lives with mom.   Incarcerated for 22 months in [place], [place]. From 2014-09/2014   Additional Social History:       Sleep: Fair  Appetite:  Fair  Current Medications: Current Facility-Administered Medications  Medication Dose Route Frequency Provider Last Rate Last Dose  . acetaminophen (TYLENOL) tablet 650 mg  650 mg Oral Q4H PRN Delfin Gant, NP   650 mg at 09/20/15 1723  . alum & mag hydroxide-simeth (MAALOX/MYLANTA) 200-200-20 MG/5ML suspension 30 mL  30 mL Oral PRN Delfin Gant, NP      . antiseptic oral rinse (BIOTENE) solution 15 mL  15 mL Mouth Rinse PRN Zoeya Gramajo, MD      . atorvastatin (LIPITOR) tablet 40 mg  40 mg Oral Daily Delfin Gant, NP   40 mg at 09/22/15 0814  . benztropine (COGENTIN) tablet 0.5 mg  0.5 mg Oral QHS Ursula Alert, MD   0.5 mg at 09/21/15 2101  . cloNIDine (CATAPRES) tablet 0.2 mg  0.2 mg Oral BID Delfin Gant, NP   0.2 mg at 09/22/15 0815  . diphenhydrAMINE (BENADRYL) capsule 25 mg  25 mg Oral Q6H PRN Ursula Alert, MD   25 mg at 09/21/15 2102  . gabapentin (NEURONTIN) capsule 600 mg  600 mg Oral TID Delfin Gant, NP   600 mg at 09/22/15 1154  . haloperidol (HALDOL) tablet 10 mg  10 mg Oral QHS Parul Porcelli, MD   10 mg at 09/21/15 2101  . haloperidol decanoate (HALDOL DECANOATE) 100 MG/ML injection 100 mg  100 mg Intramuscular Q28 days Ursula Alert, MD   100 mg at 09/22/15 1154  . hydrocerin (EUCERIN) cream   Topical BID Ursula Alert, MD      . ibuprofen (ADVIL,MOTRIN) tablet 600 mg  600 mg Oral Q8H PRN Delfin Gant, NP      . insulin aspart (novoLOG) injection 0-20 Units  0-20 Units Subcutaneous TID WC Lurena Nida, NP   15 Units at 09/22/15 1158  . insulin aspart (novoLOG) injection 0-5 Units  0-5 Units Subcutaneous QHS Lurena Nida, NP   5 Units at 09/20/15 2217  . insulin aspart (novoLOG)  injection 9 Units  9 Units Subcutaneous Once Harriet Butte, NP      . lamoTRIgine (LAMICTAL) tablet 25 mg  25 mg Oral Daily Ursula Alert, MD   25 mg at 09/22/15 0814  . LORazepam (ATIVAN) tablet 1 mg  1 mg Oral Q6H PRN Ursula Alert, MD       Or  . LORazepam (ATIVAN) injection 1 mg  1 mg Intramuscular Q6H PRN Ursula Alert, MD      .  losartan (COZAAR) tablet 50 mg  50 mg Oral Daily Delfin Gant, NP   50 mg at 09/22/15 0815  . menthol-cetylpyridinium (CEPACOL) lozenge 3 mg  1 lozenge Oral PRN Ursula Alert, MD      . metFORMIN (GLUCOPHAGE) tablet 500 mg  500 mg Oral BID PC Delfin Gant, NP   500 mg at 09/22/15 0815  . nicotine (NICODERM CQ - dosed in mg/24 hours) patch 21 mg  21 mg Transdermal Daily Ursula Alert, MD   21 mg at 09/21/15 [id number]  . OLANZapine (ZYPREXA) tablet 5 mg  5 mg Oral Q6H PRN Ursula Alert, MD       Or  . OLANZapine (ZYPREXA) injection 5 mg  5 mg Intramuscular Q6H PRN Sharnelle Cappelli, MD      . ondansetron (ZOFRAN) tablet 4 mg  4 mg Oral Q8H PRN Delfin Gant, NP      . sertraline (ZOLOFT) tablet 150 mg  150 mg Oral Daily Delfin Gant, NP   150 mg at 09/22/15 0814  . traZODone (DESYREL) tablet 100 mg  100 mg Oral QHS Delfin Gant, NP   100 mg at 09/21/15 2101  . verapamil (CALAN-SR) CR tablet 240 mg  240 mg Oral Daily Delfin Gant, NP   240 mg at 09/22/15 [id number]    Lab Results:  Results for orders placed or performed during the hospital encounter of 09/18/15 (from the past 48 hour(s))  Glucose, capillary     Status: Abnormal   Collection Time: 09/20/15  5:19 PM  Result Value Ref Range   Glucose-Capillary 389 (H) 65 - 99 mg/dL  Glucose, capillary     Status: Abnormal   Collection Time: 09/20/15  9:37 PM  Result Value Ref Range   Glucose-Capillary 405 (H) 65 - 99 mg/dL   Comment 1 Notify RN   Glucose, capillary     Status: Abnormal   Collection Time: 09/21/15  5:59 AM  Result Value Ref Range   Glucose-Capillary 286 (H) 65 - 99  mg/dL   Comment 1 Notify RN   Glucose, capillary     Status: Abnormal   Collection Time: 09/21/15 11:45 AM  Result Value Ref Range   Glucose-Capillary 346 (H) 65 - 99 mg/dL  Glucose, capillary     Status: Abnormal   Collection Time: 09/21/15  5:15 PM  Result Value Ref Range   Glucose-Capillary 363 (H) 65 - 99 mg/dL  Glucose, capillary     Status: Abnormal   Collection Time: 09/21/15  8:26 PM  Result Value Ref Range   Glucose-Capillary 493 (H) 65 - 99 mg/dL   Comment 1 Notify RN   Glucose, capillary     Status: Abnormal   Collection Time: 09/22/15  5:50 AM  Result Value Ref Range   Glucose-Capillary 328 (H) 65 - 99 mg/dL   Comment 1 Notify RN   Glucose, capillary     Status: Abnormal   Collection Time: 09/22/15 11:33 AM  Result Value Ref Range   Glucose-Capillary 316 (H) 65 - 99 mg/dL   Comment 1 Notify RN    Comment 2 Document in Chart     Physical Findings: AIMS: Facial and Oral Movements Muscles of Facial Expression: None, normal Lips and Perioral Area: None, normal Jaw: None, normal Tongue: None, normal,Extremity Movements Upper (arms, wrists, hands, fingers): None, normal Lower (legs, knees, ankles, toes): None, normal, Trunk Movements Neck, shoulders, hips: None, normal, Overall Severity Severity of abnormal movements (highest score from questions above):  None, normal Incapacitation due to abnormal movements: None, normal Patient's awareness of abnormal movements (rate only patient's report): No Awareness, Dental Status Current problems with teeth and/or dentures?: No Does patient usually wear dentures?: No  CIWA:  CIWA-Ar Total: 3 COWS:  COWS Total Score: 0  Musculoskeletal: Strength & Muscle Tone: within normal limits Gait & Station: normal Patient leans: N/A  Psychiatric Specialty Exam: Review of Systems  Psychiatric/Behavioral: Positive for depression. The patient is nervous/anxious.   All other systems reviewed and are negative.   Blood pressure  115/81, pulse 76, temperature 97.5 F (36.4 C), temperature source Oral, resp. rate 16, height 6\' 3"  (1.905 m), weight 127.461 kg (281 lb), SpO2 99 %.Body mass index is 35.12 kg/(m^2).  General Appearance: Fairly Groomed  Engineer, water::  Fair  Speech:  Clear and Coherent  Volume:  Normal  Mood:  Anxious and Depressed improving  Affect:  Appropriate  Thought Process:  Goal Directed  Orientation:  Full (Time, Place, and Person)  Thought Content:  Rumination  Suicidal Thoughts:  No  Homicidal Thoughts:  No  Memory:  Immediate;   Fair Recent;   Fair Remote;   Fair  Judgement:  Impaired  Insight:  Shallow  Psychomotor Activity:  Restlessness  Concentration:  Poor  Recall:  [employer] of Knowledge:Fair  Language: Fair  Akathisia:  No  Handed:  Right  AIMS (if indicated):     Assets:  Communication Skills  ADL's:  Intact  Cognition: WNL  Sleep:  Number of Hours: 6.5   Treatment Plan Summary: Patient is a 52 y old AAM with hx of schizoaffective do, polysubstance abuse - who presented to [facility], after being IVCed by [facility] for SI/HI. Pt tolerating his medications well. will continue to need inpatient stabilization.  Daily contact with patient to assess and evaluate symptoms and progress in treatment and Medication management Continue Haldol to 10  mg po qhs for psychosis/mood lability. Haldol decanoate 100 mg IM q28days - first dose today 09/22/15. Will continue Zoloft 150 mg po daily for affective sx. Will continue Lamictal 25 mg po daily for mood lability. Will continue Trazodone 100 mg po qhs for sleep. Will make available PRN medications as per agitation protocol. Will continue Home medications where indicated. Diabetic consult/CBG monitoring /Carb modified diet . Will continue to monitor vitals ,medication compliance and treatment side effects while patient is here.  Will monitor for medical issues as well as call consult as needed.  labs UDS- pos for cocaine, Hba1c- 01/02/15-  6.7 , cbc- wnl, CMP - Na+ low , BAL ,5, CT scan head - no acute abnormalities , TSH - wnl , pending lipid panel. CSW will start working on disposition.  Patient to participate in therapeutic milieu .   Duane Carillo  MD 09/22/2015, 2:18 PM

## 2015-09-22 NOTE — Progress Notes (Signed)
Pt did not attend wrap-up group   

## 2015-09-22 NOTE — Progress Notes (Signed)
Patient ID: Duane Salazar, male   DOB: [birth year], 40 y.o.   MRN: [id number] D: Patient stated he is doing well and had a good day. Pt reports decrease depressive symptoms. Pt asked about discharge and medication samples after discharge. Pt denies SI/HI/AVH and pain. Pt denies any needs or concern. cooperative with assessment.   A: Met with pt 1:1. Medications administered as prescribed. Writer encouraged pt to discuss feelings and come to staff with any concerns.   R: Patient is safe on the unit and complaint with medications.

## 2015-09-22 NOTE — BHH Suicide Risk Assessment (Signed)
[facility] INPATIENT:  Family/Significant Other Suicide Prevention Education  Suicide Prevention Education:  Patient Refusal for Family/Significant Other Suicide Prevention Education: The patient Duane Salazar has refused to provide written consent for family/significant other to be provided Family/Significant Other Suicide Prevention Education during admission and/or prior to discharge.  Physician notified.  Roque Lias B 09/22/2015, 4:15 PM

## 2015-09-23 DIAGNOSIS — E1169 Type 2 diabetes mellitus with other specified complication: Secondary | ICD-10-CM | POA: Insufficient documentation

## 2015-09-23 DIAGNOSIS — E669 Obesity, unspecified: Secondary | ICD-10-CM | POA: Insufficient documentation

## 2015-09-23 DIAGNOSIS — E119 Type 2 diabetes mellitus without complications: Secondary | ICD-10-CM | POA: Insufficient documentation

## 2015-09-23 LAB — GLUCOSE, CAPILLARY
Glucose-Capillary: 251 mg/dL — ABNORMAL HIGH (ref 65–99)
Glucose-Capillary: 277 mg/dL — ABNORMAL HIGH (ref 65–99)

## 2015-09-23 MED ORDER — TRAZODONE HCL 100 MG PO TABS: 100.0000 mg | ORAL_TABLET | Freq: Every day | ORAL | Status: DC

## 2015-09-23 MED ORDER — INSULIN GLARGINE 100 UNIT/ML ~~LOC~~ SOLN: 20.0000 [IU] | Freq: Every day | SUBCUTANEOUS | Status: DC

## 2015-09-23 MED ORDER — METFORMIN HCL 500 MG PO TABS: 500.0000 mg | ORAL_TABLET | Freq: Two times a day (BID) | ORAL | Status: DC

## 2015-09-23 MED ORDER — SERTRALINE HCL 50 MG PO TABS: 150.0000 mg | ORAL_TABLET | Freq: Every day | ORAL | Status: DC

## 2015-09-23 MED ORDER — HALOPERIDOL 10 MG PO TABS: 10.0000 mg | ORAL_TABLET | Freq: Every day | ORAL | Status: DC

## 2015-09-23 MED ORDER — ATORVASTATIN CALCIUM 40 MG PO TABS: 40.0000 mg | ORAL_TABLET | Freq: Every day | ORAL | Status: DC

## 2015-09-23 MED ORDER — VERAPAMIL HCL ER 240 MG PO TBCR: 240.0000 mg | EXTENDED_RELEASE_TABLET | Freq: Every day | ORAL | Status: DC

## 2015-09-23 MED ORDER — LAMOTRIGINE 25 MG PO TABS: 25.0000 mg | ORAL_TABLET | Freq: Every day | ORAL | Status: DC

## 2015-09-23 MED ORDER — LOSARTAN POTASSIUM 50 MG PO TABS: 50.0000 mg | ORAL_TABLET | Freq: Every day | ORAL | Status: DC

## 2015-09-23 MED ORDER — GABAPENTIN 300 MG PO CAPS: 600.0000 mg | ORAL_CAPSULE | Freq: Three times a day (TID) | ORAL | Status: DC

## 2015-09-23 MED ORDER — CLONIDINE HCL 0.2 MG PO TABS: 0.2000 mg | ORAL_TABLET | Freq: Two times a day (BID) | ORAL | Status: DC

## 2015-09-23 MED ORDER — HALOPERIDOL DECANOATE 100 MG/ML IM SOLN: 100.0000 mg | INTRAMUSCULAR | Status: DC

## 2015-09-23 MED ORDER — NICOTINE 21 MG/24HR TD PT24: 21.0000 mg | MEDICATED_PATCH | Freq: Every day | TRANSDERMAL | Status: DC

## 2015-09-23 MED ORDER — HYDROCERIN EX CREA: 1.0000 "application " | TOPICAL_CREAM | Freq: Two times a day (BID) | CUTANEOUS | Status: DC

## 2015-09-23 MED ORDER — BENZTROPINE MESYLATE 0.5 MG PO TABS: 0.5000 mg | ORAL_TABLET | Freq: Every day | ORAL | Status: DC

## 2015-09-23 NOTE — Progress Notes (Signed)
  [facility] Adult Case Management Discharge Plan :  Will you be returning to the same living situation after discharge:  Yes,  home with mother At discharge, do you have transportation home?: Yes,  [facility] staff member Do you have the ability to pay for your medications: Yes,  mental health  Release of information consent forms completed and in the chart;  Patient's signature needed at discharge.  Patient to Follow up at: Follow-up Information    Follow up with [facility].   Specialty:  Behavioral Health   Why:  Go to your next scheduled appointment; or, if it is over 2 weeks away, go to the walk-in clinic M-F between 8 and 11AM for your hospital follow up appointment   Contact information:   [place] [address] [phone]       Next level of care provider has access to [place] and Suicide Prevention discussed: Yes,  yes  Have you used any form of tobacco in the last 30 days? (Cigarettes, Smokeless Tobacco, Cigars, and/or Pipes): Yes  Has patient been referred to the Quitline?: Patient refused referral  Patient has been referred for addiction treatment: Yes  Roque Lias B 09/23/2015, 1:41 PM

## 2015-09-23 NOTE — BHH Group Notes (Signed)
[facility] Group Notes:  (Nursing/MHT/Case Management/Adjunct)  Date:  09/23/2015  Time:  1000  Type of Therapy:  Nurse Education  Participation Level:  Active  Participation Quality:  Appropriate and Attentive  Affect:  Appropriate  Cognitive:  Alert and Appropriate  Insight:  Appropriate  Engagement in Group:  Engaged  Modes of Intervention:  Discussion and Education  Summary of Progress/Problems: Patient attended and actively participated in Nurse led group. Today's group discussed tools for Recovery.   [name] P 09/23/2015, 1000

## 2015-09-23 NOTE — Progress Notes (Signed)
Discharge-  D-Patient verbalizes readiness for discharge: Denies SI/HI/AVH and pain. A- Discharge instructions read and discussed with patient.  All belongings returned to patient. R- Patient cooperative with discharge process.  Patient verbalizes understanding of discharge instructions.  Signed for return of belongings. Escorted to the lobby.

## 2015-09-23 NOTE — BHH Suicide Risk Assessment (Signed)
[facility] Discharge Suicide Risk Assessment   Principal Problem: Schizoaffective disorder, depressive type [facility]) Discharge Diagnoses:  Patient Active Problem List   Diagnosis Date Noted  . Alcohol use disorder, mild, abuse [F10.10] 09/18/2015  . Cannabis use disorder, mild, abuse [F12.10] 09/18/2015  . Opioid use disorder, moderate, dependence ([place]) [F11.20] 09/18/2015  . Tobacco use disorder [F17.200] 09/18/2015  . Cocaine use disorder, severe, dependence ([place]) [F14.20] 09/16/2015  . Schizoaffective disorder, depressive type ([place]) [F25.1]   . 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 Chronic pain syndrome [G89.4] 03/11/2015  . 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 Chronic headache [R51] 01/02/2015  . Right maxillary sinusitis, chronic [J32.0] 01/02/2015  . 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 HTN (hypertension) [I10] 10/21/2014  . Morbid obesity with BMI of 40.0-44.9, adult ([place]) [E66.01, Z68.41] 10/21/2014  . Sleep apnea [G47.30]   . 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 Arthritis [M19.90]   . DM2 (diabetes mellitus, type 2) ([place]) [E11.9]     Total Time spent with patient: 30 minutes  Musculoskeletal: Strength & Muscle Tone: within normal limits Gait & Station: normal Patient leans: N/A  Psychiatric Specialty Exam: Review of Systems  Psychiatric/Behavioral: Positive for substance abuse. Negative for depression, suicidal ideas and hallucinations. The patient is not nervous/anxious.   All other systems reviewed and are negative.   Blood pressure 116/80, pulse 79, temperature 97.8 F (36.6 C), temperature source Oral, resp. rate 20, height 6\' 3"  (1.905 m), weight 127.461 kg (281 lb), SpO2 99 %.Body mass index is 35.12 kg/(m^2).  General Appearance: Casual  Eye Contact::  Fair  Speech:  Clear and [id number]  Volume:  Normal  Mood:  Euthymic  Affect:  Congruent  Thought Process:  Coherent  Orientation:  Full (Time, Place, and  Person)  Thought Content:  WDL  Suicidal Thoughts:  No  Homicidal Thoughts:  No  Memory:  Immediate;   Fair Recent;   Fair Remote;   Fair  Judgement:  Fair  Insight:  Fair  Psychomotor Activity:  Normal  Concentration:  Fair  Recall:  [employer] of Knowledge:Fair  Language: Fair  Akathisia:  No  Handed:  Right  AIMS (if indicated):     Assets:  Desire for Improvement  Sleep:  Number of Hours: 6.75  Cognition: WNL  ADL's:  Intact   Mental Status Per Nursing Assessment::   On Admission:     Demographic Factors:  Male  Loss Factors: NA  Historical Factors: Impulsivity  Risk Reduction Factors:   Living with another person, especially a relative  Continued Clinical Symptoms:  Alcohol/Substance Abuse/Dependencies Previous Psychiatric Diagnoses and Treatments  Cognitive Features That Contribute To Risk:  None    Suicide Risk:  Minimal: No identifiable suicidal ideation.  Patients presenting with no risk factors but with morbid ruminations; may be classified as minimal risk based on the severity of the depressive symptoms  Follow-up Information    Follow up with [facility].   Specialty:  Behavioral Health   Why:  Go to your next scheduled appointment; or, if it is over 2 weeks away, go to the walk-in clinic M-F between 8 and 11AM for your hospital follow up appointment   Contact information:   [place] [address] [phone]       Plan Of Care/Follow-up recommendations:  Activity:  no restrictions Diet:  carb modified Tests:  follow up with PMD for diabetic management Other:  haldol decanoate 100 mg IM q28  days.First dose on 09/22/15.  Mairin Lindsley, MD 09/23/2015, 9:29 AM

## 2015-09-23 NOTE — Discharge Summary (Signed)
Physician Discharge Summary Note  Patient:  Duane Salazar is an 40 y.o., male MRN:  [id number] DOB:  1976-07-21 Patient phone:  [phone] (home)  Patient address:   [place] [address],  Total Time spent with patient: Greater than 30 minutes  Date of Admission:  09/18/2015  Date of Discharge: 09-23-15  Reason for Admission: Worsening symptoms of [facility] leading to [facility]  Principal Problem: [facility]azar, depressive type [facility])  Discharge Diagnoses: Patient Active Problem List   Diagnosis Date Noted  . Type 2 diabetes mellitus ([facility]) [E11.9]   . Alcohol use Salazar, mild, abuse [F10.10] 09/18/2015  . Cannabis use Salazar, mild, abuse [F12.10] 09/18/2015  . Opioid use Salazar, moderate, dependence ([facility]) [F11.20] 09/18/2015  . Tobacco use [facility] [F17.200] 09/18/2015  . Cocaine use Sal[facility], severe, dependence ([facility]) [F14.20] 09/16/2015  . [facility]azar, depressive type ([facility]) [F25.1]   . 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 Chronic pain syndrome [G89.4] 03/11/2015  . 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 Chronic headache [R51] 01/02/2015  . Right maxillary sinusitis, chronic [J32.0] 01/02/2015  . 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 HTN (hypertension) [I10] 10/21/2014  . Morbid obesity with BMI of 40.0-44.9, adult ([place]) [E66.01, Z68.41] 10/21/2014  . Sleep apnea [G47.30]   . 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 Arthritis [M19.90]   . DM2 (diabetes mellitus, type 2) ([place]) [E11.9]    Past Psychiatric History: Polysubstance dependence, [facility], 3 previous psychiatric hospitalization ([name], [place]).  Past Medical History:  Past Medical History  Diagnosis Date  . Pituitary macroadenoma ([facility]) 2004  . Hypertension Dx 2002  . 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Duane [facility]azar) 2004  . Sleep apnea 1995    on CPAP  . Arthritis Dx 2002  . Diabetes type  2, controlled ([facility]azar) 2010  . AKI (acute kidney injury) (Duane [facility]azar) 03/21/2015  . Schizophrenia ([facility])   . Schizo affective schizophrenia ([facility]azar) 1995  . Seizures ([facility])   . Headache     Past Surgical History  Procedure Laterality Date  . Pituitary surgery  2005 & 2012  . Skin biopsy     Family History:  Family History  Problem Relation Age of Onset  . Cancer Maternal Uncle   . Alcoholism Maternal Uncle   . Cancer Maternal Grandmother   . Heart disease Neg Hx   . Hypertension Mother   . Diabetes Mother   . Heart Problems Mother    Family Psychiatric  History: See H&P  Social History:  History  Alcohol Use  . 1.2 oz/week  . 2 Cans of beer, 0 Standard drinks or equivalent per week    Comment: 2 40s a day     History  Drug Use  . Yes  . Special: Cocaine, Marijuana, Heroin    Comment: Reports used "last night about 1800"  (09/14/15)    Social History   Social History  . Marital Status: Married    Spouse Name: N/A  . Number of Children: 2  . Years of Education: GED   Social History Main Topics  . Smoking status: Current Every Day Smoker -- 1.00 packs/day for 20 years    Types: Cigarettes  . Smokeless tobacco: Never Used     Comment: Smoking .5 ppd  . Alcohol Use: 1.2 oz/week    2 Cans of beer, 0 Standard drinks or equivalent per week     Comment: 2 40s a day  . Drug Use: Yes  Special: Cocaine, Marijuana, Heroin     Comment: Reports used "last night about 1800"  (09/14/15)  . Sexual Activity: Yes   Other Topics Concern  . None   Social History Narrative   Lives with mom.   Incarcerated for 22 months in [place], [place]. From 2014-09/2014    [facility] Course:  Duane Salazar is a 40 y.o. AA male, who is married, lives in [place] [facility] with his wife, has a hx of [facility], polysubstance abuse, pituitary macroadenoma, DM, who [facility] per [facility]. The patient had gotten into a argument with his wife last night. He left the home and went to [facility] because  he was having thoughts of killing her then killing himself.Pt states that he still feels somewhat suicidal. He does not think he would be safe by himself Patient says that he feels more like harming his wife & others than himself.  Duane Salazar was admitted to the [facility] adult unit for worsening symptoms of Duane Salazar, bipolar-type. He was also feeling suicidal as well as homicidal towards his wife after an argument. He came to the [facility] for mood stabilization treatment. After admission assessment. The medication management targeting those presenting symptoms were initiated. He was medicated & discharged on; Haldol 10 mg for mood control, Haldol Decanoate 100 unit/ml Q 28 days for mood control (due to be given again on 10-20-15), Cogentin 0.5 mg for EPS, Gabapentin 300 mg for agitation/neuropathic pain, Lamictal 25 mg for mood stabilization, Sertraline 50 mg for depression & Trazodone 100 mg for insomnia. He presented other significant pre-existing medical problems that required treatment or monitoring. He was resumed on all of his pertinent home medications for those health issues.  He tolerated his treatment regimen without any adverse effects reported.  As his stay in the [facility] & treatment progressed, improvement was monitored & noted by observation of Duane [facility]azar's daily reports of symptom reduction. His emotional & mental status were also monitored by daily self-inventory assessment reports completed by him & the clinical staff. He was evaluated by the treatment team for mood stability and plans for continued recovery after discharge. Duane Salazar's motivation was an integral factor in his mood stability. He was recommended for further follow-up care & treatment upon discharge by referring & scheduling him in an outpatient psychiatric clinic as listed below.     Upon discharge, Duane Salazar was both mentally and medically stable for discharge denying SIHI, AVH, delusional thoughts & or paranoia. He was  provided with a 7 days worth supply samples of his [facility] discharge medications. He left [facility] in no apparent distress. Transportation per [facility].  Physical Findings:  AIMS: Facial and Oral Movements Muscles of Facial Expression: None, normal Lips and Perioral Area: None, normal Jaw: None, normal Tongue: None, normal,Extremity Movements Upper (arms, wrists, hands, fingers): None, normal Lower (legs, knees, ankles, toes): None, normal, Trunk Movements Neck, shoulders, hips: None, normal, Overall Severity Severity of abnormal movements (highest score from questions above): None, normal Incapacitation due to abnormal movements: None, normal Patient's awareness of abnormal movements (rate only patient's report): No Awareness, Dental Status Current problems with teeth and/or dentures?: No Does patient usually wear dentures?: No  CIWA:  CIWA-Ar Total: 3 COWS:  COWS Total Score: 0  Musculoskeletal: Strength & Muscle Tone: within normal limits Gait & Station: normal Patient leans: N/A  Psychiatric Specialty Exam: Review of Systems  Constitutional: Negative.   HENT: Negative.   Eyes: Negative.   Respiratory: Negative.   Cardiovascular: Negative.   Gastrointestinal: Negative.  Genitourinary: Negative.   Musculoskeletal: Negative.   Skin: Negative.   Neurological: Negative.   Endo/Heme/Allergies: Negative.   Psychiatric/Behavioral: Positive for depression (Stable) and substance abuse ( Hx. Polysubstance dependence ). Negative for suicidal ideas, hallucinations and memory loss. The patient has insomnia (Stable). The patient is not nervous/anxious.     Blood pressure 116/80, pulse 79, temperature 97.8 F (36.6 C), temperature source Oral, resp. rate 20, height 6\' 3"  (1.905 m), weight 127.461 kg (281 lb), SpO2 99 %.Body mass index is 35.12 kg/(m^2).  See Md's SRA   Have you used any form of tobacco in the last 30 days? (Cigarettes, Smokeless Tobacco, Cigars, and/or Pipes): Yes  Has this  patient used any form of tobacco in the last 30 days? (Cigarettes, Smokeless Tobacco, Cigars, and/or Pipes): Yes, A prescription for an FDA-approved tobacco cessation medication was offered at discharge and the patient refused  Metabolic [facility] Labs:  Lab Results  Component Value Date   HGBA1C 6.70 01/02/2015   No results found for: PROLACTIN Lab Results  Component Value Date   CHOL 146 10/21/2014   TRIG 84 10/21/2014   HDL 33* 10/21/2014   CHOLHDL 4.4 10/21/2014   VLDL 17 10/21/2014   LDLCALC 96 10/21/2014   See Psychiatric Specialty Exam and Suicide Risk Assessment completed by Attending Physician prior to discharge.  Discharge destination:  Home  Is patient on multiple antipsychotic therapies at discharge:  No   Has Patient had three or more failed trials of antipsychotic monotherapy by history:  No  Recommended Plan for Multiple Antipsychotic Therapies: NA     Discharge Instructions    Discharge instructions    Complete by:  As directed   Haldol deaconate injection Due 10-20-15            Medication List    STOP taking these medications        ARIPiprazole 400 MG Susr  Commonly known as:  ABILIFY MAINTENA     MOBIC 15 MG tablet  Generic drug:  meloxicam     OLANZapine 15 MG tablet  Commonly known as:  ZYPREXA     predniSONE 5 MG tablet  Commonly known as:  DELTASONE      TAKE these medications      Indication   atorvastatin 40 MG tablet  Commonly known as:  LIPITOR  Take 1 tablet (40 mg total) by mouth daily. For high cholesterol   Indication:  Inherited Heterozygous Hypercholesterolemia, Elevation of Both Cholesterol and Triglycerides in Blood     benztropine 0.5 MG tablet  Commonly known as:  COGENTIN  Take 1 tablet (0.5 mg total) by mouth at bedtime. For prevention of drug induced tremors   Indication:  Extrapyramidal Reaction caused by Medications     cloNIDine 0.2 MG tablet  Commonly known as:  CATAPRES  Take 1 tablet (0.2 mg total) by  mouth 2 (two) times daily. For high blood pressure   Indication:  High Blood Pressure     gabapentin 300 MG capsule  Commonly known as:  NEURONTIN  Take 2 capsules (600 mg total) by mouth 3 (three) times daily. For agitation/pain management   Indication:  Aggressive Behavior, Agitation, Pain management     haloperidol 10 MG tablet  Commonly known as:  HALDOL  Take 1 tablet (10 mg total) by mouth at bedtime. For mood control   Indication:  Mood control     haloperidol decanoate 100 MG/ML injection  Commonly known as:  HALDOL DECANOATE  Inject 1 mL (  100 mg total) into the muscle every 28 (twenty-eight) days. (Due to be administered on 10-20-15): For mood control  Start taking on:  10/20/2015   Indication:  Mood control     hydrocerin Crea  Apply 1 application topically 2 (two) times daily. For dry skin   Indication:  Dry skin     insulin glargine 100 UNIT/ML injection  Commonly known as:  LANTUS  Inject 0.2 mLs (20 Units total) into the skin at bedtime. For diabetes management   Indication:  Type 2 Diabetes     lamoTRIgine 25 MG tablet  Commonly known as:  LAMICTAL  Take 1 tablet (25 mg total) by mouth daily. For mood stabilization   Indication:  Mood stabilization     losartan 50 MG tablet  Commonly known as:  COZAAR  Take 1 tablet (50 mg total) by mouth daily. For high blood pressure   Indication:  High Blood Pressure     metFORMIN 500 MG tablet  Commonly known as:  GLUCOPHAGE  Take 1 tablet (500 mg total) by mouth 2 (two) times daily after a meal. For diabetes management   Indication:  Type 2 Diabetes     nicotine 21 mg/24hr patch  Commonly known as:  NICODERM CQ - dosed in mg/24 hours  Place 1 patch (21 mg total) onto the skin daily. For smoking cessation   Indication:  Nicotine Addiction     sertraline 50 MG tablet  Commonly known as:  ZOLOFT  Take 3 tablets (150 mg total) by mouth daily. For depression   Indication:  Major Depressive [facility]     traZODone 100  MG tablet  Commonly known as:  DESYREL  Take 1 tablet (100 mg total) by mouth at bedtime. For sleep   Indication:  Trouble Sleeping     verapamil 240 MG CR tablet  Commonly known as:  CALAN-SR  Take 1 tablet (240 mg total) by mouth daily. For high blood pressure   Indication:  High Blood Pressure of Unknown Cause       Follow-up Information    Follow up with [facility].   Specialty:  Behavioral Health   Why:  Go to your next scheduled appointment; or, if it is over 2 weeks away, go to the walk-in clinic M-F between 8 and 11AM for your [facility] follow up appointment   Contact information:   [place] [address] [phone]      Follow-up recommendations: Activity:  As tolerated Diet: As recommended by your primary care doctor. Keep all scheduled follow-up appointments as recommended.    Comments: Take all your medications as prescribed by your mental healthcare provider. Report any adverse effects and or reactions from your medicines to your outpatient provider promptly. Patient is instructed and cautioned to not engage in alcohol and or illegal drug use while on prescription medicines. In the event of worsening symptoms, patient is instructed to call the crisis hotline, 911 and or go to the nearest ED for appropriate evaluation and treatment of symptoms. Follow-up with your primary care provider for your other medical issues, concerns and or health care needs.    Signed: Encarnacion Slates, PMHNP, FNP-BC 09/23/2015, 10:32 AM

## 2015-10-03 MED FILL — ?ATORVASTATIN 40MG TABLET: 40 | 30 days supply | Qty: 30 | Fill #7

## 2015-10-03 MED FILL — metFORMIN HCL 500 MG TABS: 500 | 30 days supply | Qty: 60 | Fill #2

## 2015-10-03 MED FILL — VERAPAMIL ER 240 MG TABLET: 240 | 30 days supply | Qty: 30 | Fill #2

## 2015-10-03 MED FILL — GABAPENTIN 400 MG CAPSULE: 400 | 30 days supply | Qty: 90 | Fill #4

## 2015-10-03 MED FILL — LOSARTAN POTASSIUM 50 MG TA: 50 | 30 days supply | Qty: 30 | Fill #7

## 2015-10-13 ENCOUNTER — Ambulatory Visit: Payer: Self-pay

## 2015-10-17 ENCOUNTER — Ambulatory Visit: Payer: MEDICAID | Attending: Family Medicine | Admitting: Family Medicine

## 2015-10-17 ENCOUNTER — Encounter: Payer: Self-pay | Admitting: Clinical

## 2015-10-17 ENCOUNTER — Encounter: Payer: Self-pay | Admitting: Family Medicine

## 2015-10-17 ENCOUNTER — Other Ambulatory Visit: Payer: Self-pay | Admitting: Internal Medicine

## 2015-10-17 ENCOUNTER — Other Ambulatory Visit: Payer: Self-pay | Admitting: Pharmacist

## 2015-10-17 VITALS — BP 125/82 | HR 87 | Temp 97.5°F | Resp 16 | Ht 75.0 in | Wt 283.0 lb

## 2015-10-17 DIAGNOSIS — B351 Tinea unguium: Secondary | ICD-10-CM | POA: Insufficient documentation

## 2015-10-17 DIAGNOSIS — Z1159 Encounter for screening for other viral diseases: Secondary | ICD-10-CM

## 2015-10-17 DIAGNOSIS — S86001A Unspecified injury of right Achilles tendon, initial encounter: Secondary | ICD-10-CM | POA: Insufficient documentation

## 2015-10-17 DIAGNOSIS — S86001S Unspecified injury of right Achilles tendon, sequela: Secondary | ICD-10-CM

## 2015-10-17 DIAGNOSIS — R768 Other specified abnormal immunological findings in serum: Secondary | ICD-10-CM

## 2015-10-17 DIAGNOSIS — Z794 Long term (current) use of insulin: Secondary | ICD-10-CM

## 2015-10-17 DIAGNOSIS — B1920 Unspecified viral hepatitis C without hepatic coma: Secondary | ICD-10-CM | POA: Insufficient documentation

## 2015-10-17 DIAGNOSIS — B353 Tinea pedis: Secondary | ICD-10-CM | POA: Insufficient documentation

## 2015-10-17 DIAGNOSIS — X58XXXS Exposure to other specified factors, sequela: Secondary | ICD-10-CM | POA: Insufficient documentation

## 2015-10-17 DIAGNOSIS — Z79899 Other long term (current) drug therapy: Secondary | ICD-10-CM | POA: Insufficient documentation

## 2015-10-17 DIAGNOSIS — B182 Chronic viral hepatitis C: Secondary | ICD-10-CM

## 2015-10-17 DIAGNOSIS — E1165 Type 2 diabetes mellitus with hyperglycemia: Secondary | ICD-10-CM | POA: Insufficient documentation

## 2015-10-17 DIAGNOSIS — F17210 Nicotine dependence, cigarettes, uncomplicated: Secondary | ICD-10-CM | POA: Insufficient documentation

## 2015-10-17 DIAGNOSIS — E893 Postprocedural hypopituitarism: Secondary | ICD-10-CM

## 2015-10-17 DIAGNOSIS — R894 Abnormal immunological findings in specimens from other organs, systems and tissues: Secondary | ICD-10-CM

## 2015-10-17 DIAGNOSIS — Z7984 Long term (current) use of oral hypoglycemic drugs: Secondary | ICD-10-CM | POA: Insufficient documentation

## 2015-10-17 DIAGNOSIS — E2749 Other adrenocortical insufficiency: Secondary | ICD-10-CM | POA: Insufficient documentation

## 2015-10-17 DIAGNOSIS — Y9367 Activity, basketball: Secondary | ICD-10-CM | POA: Insufficient documentation

## 2015-10-17 DIAGNOSIS — R51 Headache: Secondary | ICD-10-CM | POA: Insufficient documentation

## 2015-10-17 DIAGNOSIS — Z114 Encounter for screening for human immunodeficiency virus [HIV]: Secondary | ICD-10-CM | POA: Insufficient documentation

## 2015-10-17 DIAGNOSIS — E220 Acromegaly and pituitary gigantism: Secondary | ICD-10-CM | POA: Insufficient documentation

## 2015-10-17 DIAGNOSIS — Z9889 Other specified postprocedural states: Secondary | ICD-10-CM | POA: Insufficient documentation

## 2015-10-17 LAB — GLUCOSE, POCT (MANUAL RESULT ENTRY): POC Glucose: 269 mg/dl — AB (ref 70–99)

## 2015-10-17 LAB — POCT GLYCOSYLATED HEMOGLOBIN (HGB A1C): Hemoglobin A1C: 13.3

## 2015-10-17 MED ORDER — GLUCOSE BLOOD VI STRP: 1.0000 | ORAL_STRIP | Freq: Three times a day (TID) | Status: DC

## 2015-10-17 MED ORDER — METFORMIN HCL 1000 MG PO TABS: 1000.0000 mg | ORAL_TABLET | Freq: Two times a day (BID) | ORAL | Status: DC

## 2015-10-17 MED ORDER — TRUEPLUS LANCETS 28G MISC: Status: DC

## 2015-10-17 MED ORDER — ACCU-CHEK SOFTCLIX LANCETS MISC: 1.0000 | Freq: Three times a day (TID) | Status: DC

## 2015-10-17 MED ORDER — TERBINAFINE HCL 1 % EX CREA: 1.0000 "application " | TOPICAL_CREAM | Freq: Two times a day (BID) | CUTANEOUS | Status: DC

## 2015-10-17 MED ORDER — ACCU-CHEK AVIVA PLUS W/DEVICE KIT: 1.0000 | PACK | Freq: Three times a day (TID) | Status: DC

## 2015-10-17 MED ORDER — PREDNISONE 2.5 MG PO TABS: 7.5000 mg | ORAL_TABLET | Freq: Every day | ORAL | Status: DC

## 2015-10-17 MED ORDER — TRUE METRIX METER W/DEVICE KIT: 1.0000 | PACK | Freq: Three times a day (TID) | Status: DC

## 2015-10-17 MED ORDER — INSULIN GLARGINE 100 UNIT/ML ~~LOC~~ SOLN: 20.0000 [IU] | Freq: Every day | SUBCUTANEOUS | Status: DC

## 2015-10-17 MED FILL — TRUE METRIX TEST STRIP: 33 days supply | Qty: 100 | Fill #0

## 2015-10-17 MED FILL — metFORMIN HCL 1000 MG TABS: 1000 | 30 days supply | Qty: 60 | Fill #0

## 2015-10-17 MED FILL — !TRUE METRIX BLOOD GLUCOSE: 365 days supply | Qty: 1 | Fill #0

## 2015-10-17 MED FILL — LOSARTAN POTASSIUM 50 MG TA: 50 | 30 days supply | Qty: 30 | Fill #8

## 2015-10-17 MED FILL — VERAPAMIL ER 240 MG TABLET: 240 | 30 days supply | Qty: 30 | Fill #3

## 2015-10-17 MED FILL — TRUEplus LANCETS 28G MISC: 30 days supply | Qty: 100 | Fill #0

## 2015-10-17 NOTE — Assessment & Plan Note (Signed)
A: s/p resection of pituitary tumor with resultant adrenal insufficiency P: Refilled prednisone Referral back to [facility] endocrinology

## 2015-10-17 NOTE — Progress Notes (Signed)
Depression screen [facility] 2/9 10/17/2015 01/02/2015  Decreased Interest 1 0  Down, Depressed, Hopeless 3 0  PHQ - 2 Score 4 0  Altered sleeping 3 -  Tired, decreased energy 3 -  Change in appetite 3 -  Feeling bad or failure about yourself  3 -  Trouble concentrating 3 -  Moving slowly or fidgety/restless 3 -  Suicidal thoughts 1 -  PHQ-9 Score 23 -   GAD 7 : Generalized Anxiety Score 10/17/2015  Nervous, Anxious, on Edge 3  Control/stop worrying 3  Worry too much - different things 3  Trouble relaxing 3  Restless 3  Easily annoyed or irritable 3  Afraid - awful might happen 3  Total GAD 7 Score 21

## 2015-10-17 NOTE — Patient Instructions (Addendum)
Duane Salazar was seen today for hospitalization follow-up.  Diagnoses and all orders for this visit:  Uncontrolled type 2 diabetes mellitus with hyperglycemia, with long-term current use of insulin (HCC) -     POCT glycosylated hemoglobin (Hb A1C) -     POCT glucose (manual entry) -     Microalbumin/Creatinine Ratio, Urine -     Ambulatory referral to Ophthalmology -     Blood Glucose Monitoring Suppl (ACCU-CHEK AVIVA PLUS) w/Device KIT; 1 Device by Does not apply route 3 (three) times daily after meals. ICD 10 E11.65 -     ACCU-CHEK SOFTCLIX LANCETS lancets; 1 each by Other route 3 (three) times daily. ICD 10 E11.65 -     glucose blood (ACCU-CHEK AVIVA PLUS) test strip; 1 each by Other route 3 (three) times daily. ICD 10 E11.65 -     metFORMIN (GLUCOPHAGE) 1000 MG tablet; Take 1 tablet (1,000 mg total) by mouth 2 (two) times daily with a meal. For diabetes management -     insulin glargine (LANTUS) 100 UNIT/ML injection; Inject 0.2 mLs (20 Units total) into the skin at bedtime. For diabetes management  Hepatitis C antibody test positive -     Hepatitis C RNA quantitative  Need for hepatitis C screening test  Screening for HIV (human immunodeficiency virus) -     HIV antibody (with reflex)  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HCC) -     predniSONE (DELTASONE) 2.5 MG tablet; Take 3 tablets (7.5 mg total) by mouth daily with breakfast.  Injury of right Achilles tendon, sequela -     Ambulatory referral to Sports Medicine  Tinea pedis of both feet -     Ambulatory referral to Podiatry -     terbinafine (LAMISIL AT) 1 % cream; Apply 1 application topically 2 (two) times daily.  Onychomycosis of toenail -     Ambulatory referral to Podiatry -     terbinafine (LAMISIL AT) 1 % cream; Apply 1 application topically 2 (two) times daily.     Diabetes blood sugar goals  Fasting (in AM before breakfast, 8 hrs of no eating or drinking (except water or unsweetened coffee or tea): 90-110 2 hrs after meals: < 160,   No  low sugars: nothing < 70    F/u in 3 weeks with pharmacist for blood sugar check F/u with me in 3 months   Dr. Adrian Blackwater

## 2015-10-17 NOTE — Progress Notes (Signed)
Subjective:  Patient ID: Duane [facility]azar, male    DOB: Jan 31, 1976  Age: 40 y.o. MRN: [id number]  CC: Hospitalization Follow-up   HPI Duane Salazar presents for   1. Hep C: has hx of + Hep C Ab. He was diagnosed a few months ago while in drug abuse treatment for cocaine, alcohol and heroin. He stated using cocaine and alcohol in last teens. He started IVDU in 2016. He has never shared needles. No known sex partners with Hep C or HIV. Has multiple tattoos. Has been incarcerated in the past. Denies abdominal pain, nausea, emesis, swelling.   2. Acromegaly: s/p pituitary adenoma resection. He developed secondary adrenal insufficiency and has been on prednisone 7.5 mg in the past. He was also followed by Endocrinology at [facility]. He last visit was in 01/2014. He is requesting prednisone refill. Has been out for a few months. Has headaches and joint pains without the prednisone.   3. CHRONIC DIABETES  Disease Monitoring  Blood Sugar Ranges: not checking   Polyuria: no   Visual problems: yes   Medication Compliance: no  Medication Side Effects  Hypoglycemia: no   Preventitive Health Care  Eye Exam: due   Foot Exam: done today    Social History  Substance Use Topics  . Smoking status: Current Every Day Smoker -- 1.00 packs/day for 20 years    Types: Cigarettes  . Smokeless tobacco: Never Used     Comment: Smoking .5 ppd  . Alcohol Use: 1.2 oz/week    2 Cans of beer, 0 Standard drinks or equivalent per week     Comment: 2 40s a day    Outpatient Prescriptions Prior to Visit  Medication Sig Dispense Refill  . atorvastatin (LIPITOR) 40 MG tablet Take 1 tablet (40 mg total) by mouth daily. For high cholesterol 7 tablet 0  . benztropine (COGENTIN) 0.5 MG tablet Take 1 tablet (0.5 mg total) by mouth at bedtime. For prevention of drug induced tremors 30 tablet 0  . cloNIDine (CATAPRES) 0.2 MG tablet Take 1 tablet (0.2 mg total) by mouth 2 (two) times daily. For high blood pressure 14 tablet 0    . gabapentin (NEURONTIN) 300 MG capsule Take 2 capsules (600 mg total) by mouth 3 (three) times daily. For agitation/pain management 180 capsule 0  . haloperidol (HALDOL) 10 MG tablet Take 1 tablet (10 mg total) by mouth at bedtime. For mood control 30 tablet 0  . [START ON 10/20/2015] haloperidol decanoate (HALDOL DECANOATE) 100 MG/ML injection Inject 1 mL (100 mg total) into the muscle every 28 (twenty-eight) days. (Due to be administered on 10-20-15): For mood control 1 mL 0  . hydrocerin (EUCERIN) CREA Apply 1 application topically 2 (two) times daily. For dry skin  0  . insulin glargine (LANTUS) 100 UNIT/ML injection Inject 0.2 mLs (20 Units total) into the skin at bedtime. For diabetes management 10 mL 11  . lamoTRIgine (LAMICTAL) 25 MG tablet Take 1 tablet (25 mg total) by mouth daily. For mood stabilization 30 tablet 0  . losartan (COZAAR) 50 MG tablet Take 1 tablet (50 mg total) by mouth daily. For high blood pressure  3  . metFORMIN (GLUCOPHAGE) 500 MG tablet Take 1 tablet (500 mg total) by mouth 2 (two) times daily after a meal. For diabetes management  0  . nicotine (NICODERM CQ - DOSED IN MG/24 HOURS) 21 mg/24hr patch Place 1 patch (21 mg total) onto the skin daily. For smoking cessation 28 patch 0  . sertraline (  ZOLOFT) 50 MG tablet Take 3 tablets (150 mg total) by mouth daily. For depression 90 tablet 0  . traZODone (DESYREL) 100 MG tablet Take 1 tablet (100 mg total) by mouth at bedtime. For sleep 30 tablet 0  . verapamil (CALAN-SR) 240 MG CR tablet Take 1 tablet (240 mg total) by mouth daily. For high blood pressure 7 tablet 0   No facility-administered medications prior to visit.    ROS Review of Systems  Constitutional: Negative for fever, chills, fatigue and unexpected weight change.  Eyes: Negative for visual disturbance.  Respiratory: Negative for cough and shortness of breath.   Cardiovascular: Negative for chest pain, palpitations and leg swelling.  Gastrointestinal:  Negative for nausea, vomiting, abdominal pain, diarrhea, constipation and blood in stool.  Endocrine: Negative for polydipsia, polyphagia and polyuria.  Musculoskeletal: Positive for arthralgias (R heel, chronic related to basketball injury ). Negative for myalgias, back pain, gait problem and neck pain.  Skin: Positive for rash (feet ).  Allergic/Immunologic: Negative for immunocompromised state.  Neurological: Positive for headaches.  Hematological: Negative for adenopathy. Does not bruise/bleed easily.  Psychiatric/Behavioral: Positive for sleep disturbance. Negative for suicidal ideas and dysphoric mood. The patient is not nervous/anxious.     Objective:  BP 125/82 mmHg  Pulse 87  Temp(Src) 97.5 F (36.4 C) (Oral)  Resp 16  Ht '6\' 3"'  (1.905 m)  Wt 283 lb (128.368 kg)  BMI 35.37 kg/m2  SpO2 100%  BP/Weight 10/17/2015 09/18/2015 3/82/5053  Systolic BP 976 734 -  Diastolic BP 82 94 -  Wt. (Lbs) 283 - 278  BMI 35.37 - 35.68  Some encounter information is confidential and restricted. Go to Review Flowsheets activity to see all data.   Physical Exam  Constitutional: He appears well-developed and well-nourished. No distress.  HENT:  Head: Normocephalic and atraumatic.  Neck: Normal range of motion. Neck supple.  Cardiovascular: Normal rate, regular rhythm, normal heart sounds and intact distal pulses.   Pulmonary/Chest: Effort normal and breath sounds normal.  Musculoskeletal: He exhibits no edema.  Neurological: He is alert.  Skin: Skin is warm and dry. No rash noted. No erythema.  Thickened and darkened toenails with thick scaly skin on feet   Psychiatric: He has a normal mood and affect.    Lab Results  Component Value Date   HGBA1C 13.30 10/17/2015   CBG 269  Lab Results  Component Value Date   HGBA1C 13.30 10/17/2015    Assessment & Plan:   Duane Salazar was seen today for hospitalization follow-up.  Diagnoses and all orders for this visit:  Uncontrolled type 2  diabetes mellitus with hyperglycemia, with long-term current use of insulin (HCC) -     POCT glycosylated hemoglobin (Hb A1C) -     POCT glucose (manual entry) -     Microalbumin/Creatinine Ratio, Urine -     Ambulatory referral to Ophthalmology -     Blood Glucose Monitoring Suppl (ACCU-CHEK AVIVA PLUS) w/Device KIT; 1 Device by Does not apply route 3 (three) times daily after meals. ICD 10 E11.65 -     ACCU-CHEK SOFTCLIX LANCETS lancets; 1 each by Other route 3 (three) times daily. ICD 10 E11.65 -     glucose blood (ACCU-CHEK AVIVA PLUS) test strip; 1 each by Other route 3 (three) times daily. ICD 10 E11.65 -     metFORMIN (GLUCOPHAGE) 1000 MG tablet; Take 1 tablet (1,000 mg total) by mouth 2 (two) times daily with a meal. For diabetes management -  insulin glargine (LANTUS) 100 UNIT/ML injection; Inject 0.2 mLs (20 Units total) into the skin at bedtime. For diabetes management -     Ambulatory referral to Endocrinology  Hepatitis C antibody test positive -     Hepatitis C RNA quantitative  Need for hepatitis C screening test  Screening for HIV (human immunodeficiency virus) -     HIV antibody (with reflex)  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HCC) -     predniSONE (DELTASONE) 2.5 MG tablet; Take 3 tablets (7.5 mg total) by mouth daily with breakfast. -     Ambulatory referral to Endocrinology  Injury of right Achilles tendon, sequela -     Ambulatory referral to Sports Medicine  Tinea pedis of both feet -     Ambulatory referral to Podiatry -     terbinafine (LAMISIL AT) 1 % cream; Apply 1 application topically 2 (two) times daily.  Onychomycosis of toenail -     Ambulatory referral to Podiatry -     terbinafine (LAMISIL AT) 1 % cream; Apply 1 application topically 2 (two) times daily.  Status post transsphenoidal pituitary resection  Secondary adrenal insufficiency ([place]) -     Ambulatory referral to Endocrinology   Follow-up: No Follow-up on file.   Boykin Nearing MD

## 2015-10-17 NOTE — Assessment & Plan Note (Signed)
A: chronic tinea pedis in setting of uncontrolled diabetes P: lamisil CBG control

## 2015-10-17 NOTE — Progress Notes (Signed)
HFU Hep C  Hx HA due to brain turmor Pain scale #7 Has suicidal thoughts. No plans to kill him self  Tobacco user 15 cigarette per day

## 2015-10-17 NOTE — Assessment & Plan Note (Signed)
A: hep C Ab positive in setting of IVDU hx  P: Hep C quant drawn today Also screening HIV

## 2015-10-17 NOTE — Assessment & Plan Note (Signed)
A: uncontrolled due to med non compliance P; lantus 20 U qHS Metformin 1000 mg BID Low carb diet, exercise monitoring

## 2015-10-18 LAB — MICROALBUMIN / CREATININE URINE RATIO
Creatinine, Urine: 103 mg/dL (ref 20–370)
Microalb Creat Ratio: 43 mcg/mg creat — ABNORMAL HIGH (ref <30)
Microalb, Ur: 4.4 mg/dL

## 2015-10-18 LAB — HIV ANTIBODY (ROUTINE TESTING W REFLEX): HIV 1&2 Ab, 4th Generation: NONREACTIVE

## 2015-10-20 ENCOUNTER — Telehealth: Payer: Self-pay | Admitting: *Deleted

## 2015-10-20 LAB — HEPATITIS C RNA QUANTITATIVE
HCV Quantitative Log: 4.25 {Log} — ABNORMAL HIGH (ref <1.18)
HCV Quantitative: 17831 IU/mL — ABNORMAL HIGH (ref <15)

## 2015-10-20 MED FILL — predniSONE 5 MG TABS: 5 | 30 days supply | Qty: 45 | Fill #0

## 2015-10-20 NOTE — Telephone Encounter (Signed)
-----   Message from Boykin Nearing, MD sent at 10/19/2015 10:04 AM EST ----- Screening HIV negative

## 2015-10-20 NOTE — Telephone Encounter (Signed)
Date of birth verified by pt  Lab results given  Pt aware of results

## 2015-10-21 DIAGNOSIS — B182 Chronic viral hepatitis C: Secondary | ICD-10-CM | POA: Insufficient documentation

## 2015-10-21 NOTE — Addendum Note (Signed)
Addended by: Boykin Nearing on: 10/21/2015 08:28 AM   Modules accepted: Orders

## 2015-10-22 ENCOUNTER — Ambulatory Visit (INDEPENDENT_AMBULATORY_CARE_PROVIDER_SITE_OTHER): Payer: Self-pay | Admitting: Sports Medicine

## 2015-10-22 ENCOUNTER — Encounter: Payer: Self-pay | Admitting: Sports Medicine

## 2015-10-22 VITALS — BP 118/80 | HR 70 | Ht 75.0 in | Wt 283.0 lb

## 2015-10-22 DIAGNOSIS — M7661 Achilles tendinitis, right leg: Secondary | ICD-10-CM

## 2015-10-22 DIAGNOSIS — S86011A Strain of right Achilles tendon, initial encounter: Secondary | ICD-10-CM

## 2015-10-22 MED ORDER — DICLOFENAC SODIUM 75 MG PO TBEC: 75.0000 mg | DELAYED_RELEASE_TABLET | Freq: Two times a day (BID) | ORAL | Status: DC | PRN

## 2015-10-22 MED FILL — ?DICLOFENAC SOD DR 75 MG TA: 75 | 20 days supply | Qty: 40 | Fill #0

## 2015-10-22 NOTE — Progress Notes (Signed)
   Subjective:    Patient ID: Duane Salazar, male    DOB: 05-08-1976, 40 y.o.   MRN: [id number]  HPI chief complaint: Right Achilles pain  Very pleasant 40 year old male comes in today complaining of 7 years of right Achilles tendon pain. He suffered an acute Achilles tendon rupture while playing basketball when incarcerated. Unfortunately, it does not sound like he was evaluated by an orthopedist. He was simply treated with an Ace wrap per his report and nothing else. Since then he has had chronic pain along the Achilles tendon as well as weakness particularly with plantar flexion. Main reason for today's visit is to see if anything can be done about his injury. He does occasionally get pain in the left Achilles as well but but denies any trauma to the area.  Past medical history reviewed Medications reviewed Allergies reviewed    Review of Systems    as above Objective:   Physical Exam  Well-developed, well-nourished. No acute distress. Awake alert and oriented 3. Vital signs reviewed.  Right Achilles tendon: Palpable defect along the distal Achilles tendon. There is also palpable scar tissue in this area. There is an absence of plantar flexion with Thompson's testing. Patient has 3/5 strength with plantar flexion. Dorsiflexion is 5/5. Good dorsalis pedis and posterior tibial pulses.  Left Achilles tendon: There does appear to be some thickening along the distal Achilles tendon but it is intact to Thompson's testing. 5/5 strength with resisted plantar flexion. Neurovascularly intact distally.  MSK ultrasound of both the left and right Achilles tendon was performed. Left Achilles tendon does show significant thickening distally just proximal to the calcaneus consistent with Achilles tendinopathy. Right Achilles tendon shows a stump sign with significant Achilles tendon disorganization. Findings here are consistent with a chronic Achilles tendon rupture.      Assessment & Plan:  Chronic  Achilles tendon rupture, right heel Left Achilles tendinopathy  Unfortunately there is no good surgical option for this patient's chronic Achilles tendon rupture. He will be left with lifelong weakness in his right ankle and foot and possibly pain due to this injury. I provided him with some green sports insoles and 5/16 inch heel lifts in an effort to try to make ambulation a little easier for him. I've also given him a prescription for Voltaren 75 mg to take twice daily as needed for pain. Patient is currently in the process of applying for disability and it is my opinion that his chronic Achilles tendon rupture should qualify him for this. Patient will follow-up with me as needed.

## 2015-10-23 ENCOUNTER — Telehealth: Payer: Self-pay | Admitting: *Deleted

## 2015-10-23 DIAGNOSIS — B351 Tinea unguium: Secondary | ICD-10-CM

## 2015-10-23 DIAGNOSIS — B353 Tinea pedis: Secondary | ICD-10-CM

## 2015-10-23 DIAGNOSIS — E1165 Type 2 diabetes mellitus with hyperglycemia: Secondary | ICD-10-CM

## 2015-10-23 DIAGNOSIS — I10 Essential (primary) hypertension: Secondary | ICD-10-CM

## 2015-10-23 DIAGNOSIS — Z794 Long term (current) use of insulin: Principal | ICD-10-CM

## 2015-10-23 MED ORDER — CLONIDINE HCL 0.2 MG PO TABS: 0.2000 mg | ORAL_TABLET | Freq: Two times a day (BID) | ORAL | Status: DC

## 2015-10-23 MED ORDER — TERBINAFINE HCL 1 % EX CREA: 1.0000 "application " | TOPICAL_CREAM | Freq: Two times a day (BID) | CUTANEOUS | Status: DC

## 2015-10-23 MED ORDER — INSULIN GLARGINE 100 UNIT/ML ~~LOC~~ SOLN: 20.0000 [IU] | Freq: Every day | SUBCUTANEOUS | Status: DC

## 2015-10-23 NOTE — Telephone Encounter (Signed)
Med refilled.

## 2015-10-23 NOTE — Telephone Encounter (Signed)
-----   Message from Boykin Nearing, MD sent at 10/21/2015  8:25 AM EST ----- High Hep C viral load, ID referral placed for treatment Slight elevated urine microalbumin, continue losartan for BP control and kidney protection

## 2015-10-23 NOTE — Telephone Encounter (Signed)
Date of birth verified by pt  Lab results given  Notified Referral send to ID  Pt verbalized understanding    Pt requesting Rx Clonidine and cream for foot fungal

## 2015-10-24 ENCOUNTER — Encounter: Payer: Self-pay | Admitting: Clinical

## 2015-10-24 NOTE — Progress Notes (Signed)
Depression screen [facility] 2/9 10/22/2015 10/17/2015 01/02/2015  Decreased Interest 0 1 0  Down, Depressed, Hopeless 0 3 0  PHQ - 2 Score 0 4 0  Altered sleeping - 3 -  Tired, decreased energy - 3 -  Change in appetite - 3 -  Feeling bad or failure about yourself  - 3 -  Trouble concentrating - 3 -  Moving slowly or fidgety/restless - 3 -  Suicidal thoughts - 1 -  PHQ-9 Score - 23 -    GAD 7 : Generalized Anxiety Score 10/17/2015  Nervous, Anxious, on Edge 3  Control/stop worrying 3  Worry too much - different things 3  Trouble relaxing 3  Restless 3  Easily annoyed or irritable 3  Afraid - awful might happen 3  Total GAD 7 Score 21

## 2015-10-31 MED FILL — !LANTUS SOLOSTAR 100UNITS/M: 100 | 30 days supply | Qty: 6 | Fill #0

## 2015-10-31 MED FILL — cloNIDine HCL 0.2 MG TABS: 0.2 | 30 days supply | Qty: 60 | Fill #0

## 2015-11-19 ENCOUNTER — Other Ambulatory Visit: Payer: Self-pay | Admitting: Family Medicine

## 2015-11-19 MED FILL — ?DICLOFENAC SOD DR 75 MG TA: 75 | 20 days supply | Qty: 40 | Fill #1

## 2015-11-19 MED FILL — predniSONE 5 MG TABS: 5 | 30 days supply | Qty: 45 | Fill #1

## 2015-11-19 MED FILL — TRUEplus LANCETS 28G MISC: 30 days supply | Qty: 100 | Fill #1

## 2015-11-19 MED FILL — TRUE METRIX TEST STRIP: 33 days supply | Qty: 100 | Fill #1

## 2015-11-19 MED FILL — GABAPENTIN 400 MG CAPSULE: 400 | 30 days supply | Qty: 90 | Fill #0

## 2016-01-05 ENCOUNTER — Other Ambulatory Visit: Payer: Self-pay | Admitting: Sports Medicine

## 2016-01-05 ENCOUNTER — Other Ambulatory Visit: Payer: Self-pay | Admitting: Family Medicine

## 2016-01-05 MED FILL — LOSARTAN POTASSIUM 50 MG TA: 50 | 30 days supply | Qty: 30 | Fill #0

## 2016-01-05 MED FILL — TRUEplus LANCETS 28G MISC: 30 days supply | Qty: 100 | Fill #2

## 2016-01-05 MED FILL — GABAPENTIN 400 MG CAPSULE: 400 | 30 days supply | Qty: 90 | Fill #1

## 2016-01-05 MED FILL — metFORMIN HCL 1000 MG TABS: 1000 | 30 days supply | Qty: 60 | Fill #1

## 2016-01-05 MED FILL — cloNIDine HCL 0.2 MG TABS: 0.2 | 30 days supply | Qty: 60 | Fill #1

## 2016-01-05 MED FILL — predniSONE 5 MG TABS: 5 | 30 days supply | Qty: 45 | Fill #2

## 2016-01-05 MED FILL — !LANTUS SOLOSTAR 100UNITS/M: 100 | 30 days supply | Qty: 6 | Fill #1

## 2016-01-05 MED FILL — VERAPAMIL ER 240 MG TABLET: 240 | 30 days supply | Qty: 30 | Fill #4

## 2016-01-05 MED FILL — ?ATORVASTATIN 40MG TABLET: 40 | 30 days supply | Qty: 30 | Fill #0

## 2016-01-05 MED FILL — ?DICLOFENAC SOD DR 75 MG TA: 75 | 20 days supply | Qty: 40 | Fill #0

## 2016-01-05 MED FILL — TRUE METRIX TEST STRIP: 33 days supply | Qty: 100 | Fill #2

## 2016-01-22 ENCOUNTER — Telehealth: Payer: Self-pay | Admitting: Family Medicine

## 2016-01-22 MED FILL — ?DICLOFENAC SOD DR 75 MG TA: 75 | 20 days supply | Qty: 40 | Fill #1

## 2016-01-22 NOTE — Telephone Encounter (Signed)
error 

## 2016-02-24 MED FILL — DICLOFENAC SOD DR 75 MG TAB: 75 | 20 days supply | Qty: 40 | Fill #2

## 2019-11-23 ENCOUNTER — Encounter: Payer: Self-pay | Admitting: Family Medicine

## 2019-11-23 ENCOUNTER — Other Ambulatory Visit: Payer: Self-pay

## 2019-11-23 ENCOUNTER — Ambulatory Visit: Payer: Self-pay | Attending: Family Medicine | Admitting: Family Medicine

## 2019-11-23 ENCOUNTER — Other Ambulatory Visit: Payer: Self-pay | Admitting: Pharmacist

## 2019-11-23 VITALS — BP 144/101 | HR 73 | Temp 98.2°F | Resp 16 | Ht 75.5 in | Wt 300.4 lb

## 2019-11-23 DIAGNOSIS — E1165 Type 2 diabetes mellitus with hyperglycemia: Secondary | ICD-10-CM

## 2019-11-23 DIAGNOSIS — E230 Hypopituitarism: Secondary | ICD-10-CM

## 2019-11-23 DIAGNOSIS — D497 Neoplasm of unspecified behavior of endocrine glands and other parts of nervous system: Secondary | ICD-10-CM

## 2019-11-23 DIAGNOSIS — I10 Essential (primary) hypertension: Secondary | ICD-10-CM

## 2019-11-23 DIAGNOSIS — E2749 Other adrenocortical insufficiency: Secondary | ICD-10-CM

## 2019-11-23 DIAGNOSIS — G8929 Other chronic pain: Secondary | ICD-10-CM

## 2019-11-23 DIAGNOSIS — Z8619 Personal history of other infectious and parasitic diseases: Secondary | ICD-10-CM

## 2019-11-23 DIAGNOSIS — E1142 Type 2 diabetes mellitus with diabetic polyneuropathy: Secondary | ICD-10-CM

## 2019-11-23 DIAGNOSIS — G479 Sleep disorder, unspecified: Secondary | ICD-10-CM

## 2019-11-23 DIAGNOSIS — M2550 Pain in unspecified joint: Secondary | ICD-10-CM

## 2019-11-23 DIAGNOSIS — Z79899 Other long term (current) drug therapy: Secondary | ICD-10-CM

## 2019-11-23 LAB — GLUCOSE, POCT (MANUAL RESULT ENTRY): POC Glucose: 138 mg/dL — AB (ref 70–99)

## 2019-11-23 LAB — POCT GLYCOSYLATED HEMOGLOBIN (HGB A1C): HbA1c, POC (controlled diabetic range): 7.5 % — AB (ref 0.0–7.0)

## 2019-11-23 MED ORDER — PREDNISONE 5 MG PO TABS: 7.5000 mg | ORAL_TABLET | Freq: Every day | ORAL | 2 refills | Status: DC

## 2019-11-23 MED ORDER — GLIPIZIDE ER 10 MG PO TB24: 10.0000 mg | ORAL_TABLET | Freq: Every day | ORAL | 4 refills | Status: DC

## 2019-11-23 MED ORDER — AMLODIPINE BESYLATE 5 MG PO TABS: 5.0000 mg | ORAL_TABLET | Freq: Every day | ORAL | 4 refills | Status: DC

## 2019-11-23 MED ORDER — PIOGLITAZONE HCL 30 MG PO TABS: 30.0000 mg | ORAL_TABLET | Freq: Every day | ORAL | 4 refills | Status: DC

## 2019-11-23 MED ORDER — TRAZODONE HCL 100 MG PO TABS: 100.0000 mg | ORAL_TABLET | Freq: Every day | ORAL | 4 refills | Status: DC

## 2019-11-23 MED ORDER — METFORMIN HCL 1000 MG PO TABS: 1000.0000 mg | ORAL_TABLET | Freq: Two times a day (BID) | ORAL | 4 refills | Status: DC

## 2019-11-23 MED ORDER — ATORVASTATIN CALCIUM 40 MG PO TABS: 40.0000 mg | ORAL_TABLET | Freq: Every day | ORAL | 4 refills | Status: DC

## 2019-11-23 MED ORDER — PREDNISONE 2.5 MG PO TABS: 7.5000 mg | ORAL_TABLET | Freq: Every day | ORAL | 4 refills | Status: DC

## 2019-11-23 MED ORDER — GABAPENTIN 600 MG PO TABS: ORAL_TABLET | ORAL | 4 refills | Status: DC

## 2019-11-23 MED ORDER — LOSARTAN POTASSIUM-HCTZ 100-25 MG PO TABS: 1.0000 | ORAL_TABLET | Freq: Every day | ORAL | 4 refills | Status: DC

## 2019-11-23 MED ORDER — HYDRALAZINE HCL 25 MG PO TABS: 25.0000 mg | ORAL_TABLET | Freq: Three times a day (TID) | ORAL | 4 refills | Status: DC

## 2019-11-23 MED FILL — PIOGLITAZONE HCL 30 MG TAB: 30 | 30 days supply | Qty: 30 | Fill #0

## 2019-11-23 MED FILL — hydrALAZINE HCL 25 MG TABS: 25 | 30 days supply | Qty: 90 | Fill #0

## 2019-11-23 MED FILL — ATORVASTATIN CALCIUM 40 MG: 40 | 30 days supply | Qty: 30 | Fill #0

## 2019-11-23 MED FILL — glipiZIDE XL 10 MG TB24: 10 | 30 days supply | Qty: 30 | Fill #0

## 2019-11-23 MED FILL — metFORMIN HCL 1000 MG TABS: 1000 | 30 days supply | Qty: 60 | Fill #0

## 2019-11-23 MED FILL — traZODone HCL 100 MG TABS: 100 | 30 days supply | Qty: 30 | Fill #0

## 2019-11-23 MED FILL — LOSARTAN-HCTZ 100-25 MG TAB: 100-25 | 30 days supply | Qty: 30 | Fill #0

## 2019-11-23 MED FILL — predniSONE 5 MG TABS: 5 | 30 days supply | Qty: 45 | Fill #0

## 2019-11-23 MED FILL — GABAPENTIN 600 MG TABLET: 600 | 30 days supply | Qty: 120 | Fill #0

## 2019-11-23 MED FILL — AMLODIPINE BESYLATE 5 MG TA: 5 | 30 days supply | Qty: 30 | Fill #0

## 2019-11-23 NOTE — Progress Notes (Signed)
Subjective:  Patient ID: Duane Salazar, male    DOB: 1976/04/02  Age: 45 y.o. MRN: [id number]  CC: New Patient (Initial Visit)   HPI Duane Salazar, 44 yo male who was last seen at this office on 10/17/2015, who presents to re-establish care s/p recent release from incarceration. He has a list of medications that he was taking but thinks that he may have left one of the sheets with his mental health provider and he thinks that this sheet contained his insulin which he has not been  taking.  He reports that he is having issues with multiple joint pain especially in his knees, ankles and back.  He also has issues with his Achilles tendon.  He states that he injured his Achilles tendon in the past but did not receive any medical attention and then when he was seen by a specialist he was told that there was no other treatment available to help with this.  He also would like to have a refill of gabapentin.  He states that he was taking this medication in the past and it helped with the joint pain and patient also reports that he has burning stinging pain in his feet and this keeps him awake at night.  He reports that he has also had some recent increase in swelling of his hands and has difficulty making a fist due to the swelling.  He reports his joint pain is always around a 9 on a 0-to-10 scale.  He reports that he was diagnosed with degenerative joint disease.         He also has a history of pituitary tumor with acromegaly.  He is status post removal of his pituitary tumor and per his medication list status post incarceration, he has been taking 10 mg of prednisone every other day.  On review of this chart, he was placed on 7.5 mg of prednisone daily by endocrinology.  He reports that he still has some occasional headaches and he is not sure if this is related to blood pressure or to his endocrine issues.  He also has diabetes and is having increased thirst and increased urinary frequency.  He reports that he  does have a glucometer but has not recently checked his blood sugars.  He reports that he has been compliant with his blood pressure medications.  He feels that his major issue currently is his chronic joint pain.  Past Medical History:  Diagnosis Date  . 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 AKI (acute kidney injury) ([place]) 03/21/2015  . Arthritis Dx 2002  . Diabetes type 2, controlled ([place]) 2010  . Headache   . Hypertension Dx 2002  . 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 Schizo affective schizophrenia ([place]) 1995  . Schizophrenia ([place])   . Seizures ([place])   . Sleep apnea 1995   on CPAP    Past Surgical History:  Procedure Laterality Date  . PITUITARY SURGERY  2005 & 2012  . SKIN BIOPSY      Family History  Problem Relation Age of Onset  . Cancer Maternal Uncle   . Alcoholism Maternal Uncle   . Cancer Maternal Grandmother   . Heart disease Neg Hx   . Hypertension Mother   . Diabetes Mother   . Heart Problems Mother     Social History   Tobacco Use  . Smoking status: Current Every Day Smoker    Packs/day: 1.00    Years: 20.00    Pack years: 20.00  Types: Cigarettes  . Smokeless tobacco: Never Used  . Tobacco comment: Smoking .5 ppd  Substance Use Topics  . Alcohol use: Yes    Alcohol/week: 2.0 standard drinks    Types: 2 Cans of beer per week    Comment: 2 40s a day    ROS Review of Systems  Constitutional: Positive for fatigue. Negative for chills and unexpected weight change.  HENT: Negative for sore throat and trouble swallowing.   Eyes: Negative for photophobia and visual disturbance.  Respiratory: Negative for cough and shortness of breath.   Cardiovascular: Negative for chest pain, palpitations and leg swelling.  Gastrointestinal: Negative for abdominal pain, blood in stool, constipation, diarrhea and nausea.  Endocrine: Positive for polydipsia and polyuria. Negative for cold intolerance, heat intolerance and polyphagia.  Genitourinary: Positive for frequency.  Negative for dysuria.  Musculoskeletal: Positive for arthralgias, back pain, joint swelling and myalgias.  Skin: Negative for rash and wound.  Neurological: Positive for numbness (feet) and headaches. Negative for dizziness.  Hematological: Negative for adenopathy. Does not bruise/bleed easily.  Psychiatric/Behavioral: Positive for sleep disturbance. Negative for self-injury and suicidal ideas.    Objective:   Today's Vitals: BP (!) 149/102   Pulse 73   Temp 98.2 F (36.8 C)   Resp 16   Ht 6' 3.5" (1.918 m)   Wt (!) 300 lb 6.4 oz (136.3 kg)   SpO2 97%   BMI 37.05 kg/m   Physical Exam Vitals and nursing note reviewed.  Constitutional:      General: He is not in acute distress.    Appearance: Normal appearance.     Comments: WNWD large framed male in NAD wearing mask as per office COVID-19 protocol  Neck:     Vascular: No carotid bruit.  Cardiovascular:     Rate and Rhythm: Normal rate and regular rhythm.     Pulses:          Dorsalis pedis pulses are 0 on the right side and 1+ on the left side.       Posterior tibial pulses are 0 on the right side and 0 on the left side.  Pulmonary:     Effort: Pulmonary effort is normal.     Breath sounds: Normal breath sounds.  Abdominal:     Palpations: Abdomen is soft.     Tenderness: There is no abdominal tenderness. There is no right CVA tenderness, left CVA tenderness, guarding or rebound.  Musculoskeletal:        General: Swelling and tenderness present.     Cervical back: Normal range of motion and neck supple. No tenderness.     Right lower leg: No edema.     Left lower leg: No edema.     Right foot: Deformity present.     Comments: Bilateral hand swelling left greater than right with MCP swelling esp on left and tenderness and cannot close his hands into fists  Feet:     Right foot:     Protective Sensation: 10 sites tested. 0 sites sensed.     Skin integrity: Callus and dry skin present. No fissure.     Toenail Condition:  Right toenails are abnormally thick. Fungal disease present.    Left foot:     Protective Sensation: 10 sites tested. 2 sites sensed.     Skin integrity: Dry skin present. No skin breakdown.     Toenail Condition: Left toenails are normal.     Comments: Hammertoe deformities and nodules on the toe joints  on the right; thick callus on right heel. Only areas of monofilament sensation- left plantar midfoot and top of foot Lymphadenopathy:     Cervical: No cervical adenopathy.  Skin:    General: Skin is warm and dry.  Neurological:     Mental Status: He is alert and oriented to person, place, and time.     Sensory: Sensory deficit (abnormal monofilament exam of feet) present.  Psychiatric:        Mood and Affect: Mood normal.        Behavior: Behavior normal.     Assessment & Plan:  1. Uncontrolled type 2 diabetes mellitus with hyperglycemia ([place]) Patient has previously had uncontrolled type 2 diabetes but at today's visit, hemoglobin A1c is close to goal of 7 or less.  Today hemoglobin A1c is 7.5 and random glucose of 138.  He will have urine microalbumin creatinine ratio at today's visit and complete metabolic panel in follow-up of his diabetes.  He will need to contact the office regarding his insulin as he reports that he believes that he gave the paperwork with his type of insulin dose to his mental health provider as his mental health medications were on the other page.  He is given new prescriptions for atorvastatin 40 mg, Glucophage 1000 mg twice daily and glipizide which was 5 mg has been increased to 10 mg.  He was also on generic Actos at 45 mg and this will be decreased to 30 mg once daily.  He is encouraged to continue monitoring his blood sugars as he states that he does have a home glucometer.  He has been asked to return to clinic for follow-up with the clinical pharmacist regarding his diabetes and for recheck of blood pressure. - POCT glucose (manual entry) - POCT glycosylated  hemoglobin (Hb A1C) - Microalbumin / creatinine urine ratio - metFORMIN (GLUCOPHAGE) 1000 MG tablet; Take 1 tablet (1,000 mg total) by mouth 2 (two) times daily with a meal. For diabetes management  Dispense: 60 tablet; Refill: 4 - atorvastatin (LIPITOR) 40 MG tablet; Take 1 tablet (40 mg total) by mouth daily. Must have OV for refills  Dispense: 30 tablet; Refill: 4 - glipiZIDE (GLUCOTROL XL) 10 MG 24 hr tablet; Take 1 tablet (10 mg total) by mouth daily with breakfast. To lower blood sugar  Dispense: 30 tablet; Refill: 4 - gabapentin (NEURONTIN) 600 MG tablet; 1 pill twice per day and 2 at bedtime  Dispense: 120 tablet; Refill: 4 - Comprehensive metabolic panel; Future - T4 AND TSH - Ambulatory referral to Endocrinology - pioglitazone (ACTOS) 30 MG tablet; Take 1 tablet (30 mg total) by mouth daily.  Dispense: 30 tablet; Refill: 4 - Comprehensive metabolic panel  2. 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 Secondary adrenal insufficiency ([facility]) Patient with history of a pituitary mass which was removed and patient with secondary adrenal insufficiency.  Per his medication list from the correctional facility he has been taking prednisone 10 mg every other day but as per his last visit note with this clinic, he was on 7.5 mg daily which is being renewed and patient is being referred to endocrinology for continued follow-up. - predniSONE (DELTASONE) 2.5 MG tablet; Take 3 tablets (7.5 mg total) by mouth daily with breakfast.  Dispense: 90 tablet; Refill: 4  4. Chronic pain of multiple joints Patient complains of chronic pain in multiple joints and patient has some findings on examination especially swelling of the hands and MCP joints suggestive of possible rheumatoid arthritis.  Patient will have arthritis panel at today's visit, as well as CBC.  He has been referred to orthopedic surgery as he likely also has some issues with osteoarthritis of the knees. - Arthritis Panel - CBC - Ambulatory  referral to Orthopedic Surgery  5. Difficulty sleeping He reports difficulty sleeping and prescription provided for refill of trazodone as per patient's medication list that he brought with him to today's visit. - traZODone (DESYREL) 100 MG tablet; Take 1 tablet (100 mg total) by mouth at bedtime. For sleep  Dispense: 30 tablet; Refill: 4  6. Essential hypertension Patient with mild increase in blood pressure and will have recheck of blood pressure in approximately 2 weeks by the clinical pharmacist.  He is provided with new prescriptions for losartan hydrochlorothiazide 100-25 (per his medication list, he was previously on separate losartan 100 mg and hydrochlorothiazide 25 mg).  Refills also provided for hydralazine and amlodipine as per medication list. - losartan-hydrochlorothiazide (HYZAAR) 100-25 MG tablet; Take 1 tablet by mouth daily. To lower blood pressure  Dispense: 30 tablet; Refill: 4 - hydrALAZINE (APRESOLINE) 25 MG tablet; Take 1 tablet (25 mg total) by mouth 3 (three) times daily.  Dispense: 90 tablet; Refill: 4 - amLODipine (NORVASC) 5 MG tablet; Take 1 tablet (5 mg total) by mouth daily.  Dispense: 30 tablet; Refill: 4  7. Encounter for long-term (current) use of medications Patient have comprehensive metabolic panel in follow-up of long-term use of high-risk medications including statin therapy, medication for treatment of diabetes, hypertension medication and long-term use of prednisone. - Comprehensive metabolic panel  8. History of Hepatitis C Referral to infectious disease placed as patient reports history of hepatitis C for which he has not received any treatment. -Ambulatory referral to Infectious disease  Outpatient Encounter Medications as of 11/23/2019  Medication Sig  . gabapentin (NEURONTIN) 400 MG capsule TAKE 1 CAPSULE BY MOUTH 3 TIMES DAILY  . atorvastatin (LIPITOR) 40 MG tablet Take 1 tablet (40 mg total) by mouth daily. Must have OV for refills  . benztropine  (COGENTIN) 0.5 MG tablet Take 1 tablet (0.5 mg total) by mouth at bedtime. For prevention of drug induced tremors  . Blood Glucose Monitoring Suppl (TRUE METRIX METER) w/Device KIT 1 each by Does not apply route 3 (three) times daily after meals.  . cloNIDine (CATAPRES) 0.2 MG tablet Take 1 tablet (0.2 mg total) by mouth 2 (two) times daily. For high blood pressure  . diclofenac (VOLTAREN) 75 MG EC tablet TAKE 1 TABLET BY MOUTH 2 TIMES DAILY AS NEEDED.TAKE WITH FOOD.  Marland Kitchen Glucose Blood (TRUE METRIX BLOOD GLUCOSE TEST VI)   . glucose blood (TRUE METRIX BLOOD GLUCOSE TEST) test strip 1 each by Other route 3 (three) times daily. Use as instructed  . glucose blood test strip   . haloperidol (HALDOL) 10 MG tablet Take 1 tablet (10 mg total) by mouth at bedtime. For mood control  . haloperidol decanoate (HALDOL DECANOATE) 100 MG/ML injection Inject 1 mL (100 mg total) into the muscle every 28 (twenty-eight) days. (Due to be administered on 10-20-15): For mood control (Patient not taking: Reported on 10/17/2015)  . insulin glargine (LANTUS) 100 UNIT/ML injection Inject 0.2 mLs (20 Units total) into the skin at bedtime. For diabetes management  . lamoTRIgine (LAMICTAL) 25 MG tablet Take 1 tablet (25 mg total) by mouth daily. For mood stabilization  . losartan (COZAAR) 50 MG tablet Take 1 tablet (50 mg total) by mouth daily. Must have office visit for refills  .  metFORMIN (GLUCOPHAGE) 1000 MG tablet Take 1 tablet (1,000 mg total) by mouth 2 (two) times daily with a meal. For diabetes management  . metFORMIN (GLUCOPHAGE) 500 MG tablet   . predniSONE (DELTASONE) 2.5 MG tablet Take 3 tablets (7.5 mg total) by mouth daily with breakfast.  . sertraline (ZOLOFT) 50 MG tablet Take 3 tablets (150 mg total) by mouth daily. For depression  . terbinafine (LAMISIL AT) 1 % cream Apply 1 application topically 2 (two) times daily.  . traZODone (DESYREL) 100 MG tablet Take 1 tablet (100 mg total) by mouth at bedtime. For sleep   . TRUEPLUS LANCETS 28G MISC USE AS DIRECTED  . verapamil (CALAN-SR) 240 MG CR tablet Take 1 tablet (240 mg total) by mouth daily. For high blood pressure   No facility-administered encounter medications on file as of 11/23/2019.     An After Visit Summary was printed and given to the patient.   Follow-up: Return in about 8 weeks (around 01/18/2020) for chronic issues; 1-2 week follow-up with Luke/CPP.   Antony Blackbird MD

## 2019-11-23 NOTE — Progress Notes (Signed)
Pt states his pain is coming from his joints  

## 2019-11-24 LAB — CBC
Hematocrit: 49 % (ref 37.5–51.0)
Hemoglobin: 15.3 g/dL (ref 13.0–17.7)
MCH: 23.9 pg — ABNORMAL LOW (ref 26.6–33.0)
MCHC: 31.2 g/dL — ABNORMAL LOW (ref 31.5–35.7)
MCV: 76 fL — ABNORMAL LOW (ref 79–97)
Platelets: 267 x10E3/uL (ref 150–450)
RBC: 6.41 x10E6/uL — ABNORMAL HIGH (ref 4.14–5.80)
RDW: 15 % (ref 11.6–15.4)
WBC: 6.7 x10E3/uL (ref 3.4–10.8)

## 2019-11-24 LAB — COMPREHENSIVE METABOLIC PANEL WITH GFR
ALT: 37 IU/L (ref 0–44)
AST: 36 IU/L (ref 0–40)
Albumin/Globulin Ratio: 1.5 (ref 1.2–2.2)
Albumin: 4.3 g/dL (ref 4.0–5.0)
Alkaline Phosphatase: 61 IU/L (ref 39–117)
BUN/Creatinine Ratio: 13 (ref 9–20)
BUN: 11 mg/dL (ref 6–24)
Bilirubin Total: 0.5 mg/dL (ref 0.0–1.2)
CO2: 20 mmol/L (ref 20–29)
Calcium: 9.5 mg/dL (ref 8.7–10.2)
Chloride: 102 mmol/L (ref 96–106)
Creatinine, Ser: 0.83 mg/dL (ref 0.76–1.27)
GFR calc Af Amer: 123 mL/min/1.73
GFR calc non Af Amer: 106 mL/min/1.73
Globulin, Total: 2.9 g/dL (ref 1.5–4.5)
Glucose: 143 mg/dL — ABNORMAL HIGH (ref 65–99)
Potassium: 4.5 mmol/L (ref 3.5–5.2)
Sodium: 136 mmol/L (ref 134–144)
Total Protein: 7.2 g/dL (ref 6.0–8.5)

## 2019-11-24 LAB — T4 AND TSH
T4, Total: 10.5 ug/dL (ref 4.5–12.0)
TSH: 0.575 u[IU]/mL (ref 0.450–4.500)

## 2019-11-24 LAB — MICROALBUMIN / CREATININE URINE RATIO
Creatinine, Urine: 149.8 mg/dL
Microalb/Creat Ratio: 45 mg/g{creat} — ABNORMAL HIGH (ref 0–29)
Microalbumin, Urine: 66.9 ug/mL

## 2019-11-24 LAB — ARTHRITIS PANEL
Anti Nuclear Antibody (ANA): NEGATIVE
Rheumatoid fact SerPl-aCnc: 14 [IU]/mL — ABNORMAL HIGH (ref 0.0–13.9)
Sed Rate: 10 mm/h (ref 0–15)
Uric Acid: 6 mg/dL (ref 3.8–8.4)

## 2019-11-27 ENCOUNTER — Other Ambulatory Visit: Payer: Self-pay | Admitting: Family Medicine

## 2019-11-27 DIAGNOSIS — R768 Other specified abnormal immunological findings in serum: Secondary | ICD-10-CM

## 2019-11-27 DIAGNOSIS — M2550 Pain in unspecified joint: Secondary | ICD-10-CM

## 2019-11-27 DIAGNOSIS — G8929 Other chronic pain: Secondary | ICD-10-CM

## 2019-11-27 DIAGNOSIS — R7689 Other specified abnormal immunological findings in serum: Secondary | ICD-10-CM

## 2019-12-07 ENCOUNTER — Ambulatory Visit: Payer: Self-pay | Admitting: Pharmacist

## 2019-12-07 ENCOUNTER — Encounter: Payer: Self-pay | Admitting: *Deleted

## 2019-12-17 MED FILL — predniSONE 5 MG TABS: 5 | 30 days supply | Qty: 45 | Fill #0

## 2020-01-22 ENCOUNTER — Telehealth: Payer: Self-pay | Admitting: Family Medicine

## 2020-01-22 NOTE — Telephone Encounter (Signed)
LPN at [facility] and patient stated medication hydrALAZINE (APRESOLINE) 25 MG tablet is requested to be discontinued due to frequent urination throughout the night and tiredness throughout the day because of the constant getting up to use the restroom. Please call Phylis Ruff LPN in charge and give verbal orders asap

## 2020-01-24 ENCOUNTER — Ambulatory Visit: Payer: Self-pay | Admitting: Family Medicine

## 2020-01-29 NOTE — Telephone Encounter (Signed)
Spoke to Nurse Clint Guy regarding patient complaints and potential side effects to hydralazine.   Per Nurse Clint Guy, his BP on admission date of May 20, was 153/106. His SBP reading have been 130's /80's- 90's.   Per Nurse Clint Guy, his blood sugars are not checked daily. He has to check them himself. Staff is not permitted to check his BS since they are not medical. His blood sugar when he was admitted was 212. Per Nurse Clint Guy, he s aware of the s/s of hyper and hypogycemia.   Verified the medications with Nurse Clint Guy .  Patient takes:  Amlodipine 10mg  Atorvastatin 40 Glipizide 10 mg  Hydralazine 25mg  tid Losartan- HCTZ Actos 30 mg  Not taking gabapentin, prednisone amonst other medication.   He has an app. Thursday, June 3 at 10:10   Nurse Clint Guy is reqeusting a verbal order to say:  Hold Hydralizine until MD appt.  Ok to check CBG's   Please advise.

## 2020-01-29 NOTE — Telephone Encounter (Signed)
LMOVM for Mrs. Ruff to return call.

## 2020-01-29 NOTE — Telephone Encounter (Signed)
Order given to Nurse Ruff. Verbalized understanding.

## 2020-01-29 NOTE — Telephone Encounter (Signed)
Those verbal orders are fine to give.   Phill Myron, D.O. Primary Care at [facility]  01/29/2020, 10:21 AM

## 2020-01-31 ENCOUNTER — Ambulatory Visit: Payer: Self-pay | Attending: Family Medicine | Admitting: Physician Assistant

## 2020-01-31 ENCOUNTER — Other Ambulatory Visit: Payer: Self-pay

## 2020-01-31 VITALS — BP 114/76 | HR 75 | Temp 97.7°F | Ht 75.5 in | Wt 293.0 lb

## 2020-01-31 DIAGNOSIS — I10 Essential (primary) hypertension: Secondary | ICD-10-CM

## 2020-01-31 DIAGNOSIS — F1910 Other psychoactive substance abuse, uncomplicated: Secondary | ICD-10-CM

## 2020-01-31 DIAGNOSIS — E230 Hypopituitarism: Secondary | ICD-10-CM

## 2020-01-31 DIAGNOSIS — E1165 Type 2 diabetes mellitus with hyperglycemia: Secondary | ICD-10-CM

## 2020-01-31 DIAGNOSIS — D497 Neoplasm of unspecified behavior of endocrine glands and other parts of nervous system: Secondary | ICD-10-CM

## 2020-01-31 LAB — POCT GLYCOSYLATED HEMOGLOBIN (HGB A1C): Hemoglobin A1C: 7.8 % — AB (ref 4.0–5.6)

## 2020-01-31 LAB — GLUCOSE, POCT (MANUAL RESULT ENTRY): POC Glucose: 156 mg/dl — AB (ref 70–99)

## 2020-01-31 MED ORDER — METFORMIN HCL 1000 MG PO TABS: 1000.0000 mg | ORAL_TABLET | Freq: Two times a day (BID) | ORAL | 4 refills | Status: DC

## 2020-01-31 MED ORDER — GLIPIZIDE ER 10 MG PO TB24: 10.0000 mg | ORAL_TABLET | Freq: Every day | ORAL | 4 refills | Status: DC

## 2020-01-31 MED ORDER — LOSARTAN POTASSIUM-HCTZ 100-25 MG PO TABS: 1.0000 | ORAL_TABLET | Freq: Every day | ORAL | 4 refills | Status: DC

## 2020-01-31 MED ORDER — GABAPENTIN 600 MG PO TABS: ORAL_TABLET | ORAL | 4 refills | Status: DC

## 2020-01-31 MED ORDER — HYDRALAZINE HCL 25 MG PO TABS: ORAL_TABLET | ORAL | 4 refills | Status: DC

## 2020-01-31 MED ORDER — PREDNISONE 5 MG PO TABS: 7.5000 mg | ORAL_TABLET | Freq: Every day | ORAL | 2 refills | Status: DC

## 2020-01-31 MED ORDER — PIOGLITAZONE HCL 30 MG PO TABS: 30.0000 mg | ORAL_TABLET | Freq: Every day | ORAL | 4 refills | Status: DC

## 2020-01-31 MED ORDER — ATORVASTATIN CALCIUM 40 MG PO TABS: 40.0000 mg | ORAL_TABLET | Freq: Every day | ORAL | 4 refills | Status: DC

## 2020-01-31 MED ORDER — AMLODIPINE BESYLATE 5 MG PO TABS: 5.0000 mg | ORAL_TABLET | Freq: Every day | ORAL | 4 refills | Status: DC

## 2020-01-31 MED ORDER — CLONIDINE HCL 0.2 MG PO TABS: 0.2000 mg | ORAL_TABLET | Freq: Two times a day (BID) | ORAL | 2 refills | Status: DC

## 2020-01-31 MED FILL — PIOGLITAZONE HCL 30 MG TAB: 30 | 30 days supply | Qty: 30 | Fill #0

## 2020-01-31 MED FILL — cloNIDine HCL 0.2 MG TABS: 0.2 | 30 days supply | Qty: 60 | Fill #0

## 2020-01-31 MED FILL — glipiZIDE XL 10 MG TB24: 10 | 30 days supply | Qty: 30 | Fill #0

## 2020-01-31 MED FILL — ATORVASTATIN CALCIUM 40 MG: 40 | 30 days supply | Qty: 30 | Fill #0

## 2020-01-31 MED FILL — predniSONE 5 MG TABS: 5 | 30 days supply | Qty: 45 | Fill #0

## 2020-01-31 NOTE — Progress Notes (Signed)
Patient ID: Duane Salazar, male   DOB: [birth year], 44 y.o.   MRN: [id number]   Duane Salazar, is a 44 y.o. male  NGE:[id number]  [id number]  DOB - [birth year]  Subjective:  Chief Complaint and HPI: Duane Salazar is a 44 y.o. male here today for med RF.  Hydralazine making him urinate all night so wants to stop evening dose.  He was in prison.  Got out in February.  Now at [facility].  Blood sugar running from 130-180.  He has not been taking metformin for months.     ROS:   Constitutional:  No f/c, No night sweats, No unexplained weight loss. EENT:  No vision changes, No blurry vision, No hearing changes. No mouth, throat, or ear problems.  Respiratory: No cough, No SOB Cardiac: No CP, no palpitations GI:  No abd pain, No N/V/D. GU: No Urinary s/sx Musculoskeletal: No joint pain Neuro: No headache, no dizziness, no motor weakness.  Skin: No rash Endocrine:  No polydipsia. No polyuria.  Psych: Denies SI/HI  No problems updated.  ALLERGIES: No Known Allergies  PAST MEDICAL HISTORY: Past Medical History:  Diagnosis Date  . 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 AKI (acute kidney injury) ([place]) 03/21/2015  . Arthritis Dx 2002  . Diabetes type 2, controlled ([place]) 2010  . Headache   . Hypertension Dx 2002  . 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 Schizo affective schizophrenia ([place]) 1995  . Schizophrenia ([place])   . Seizures ([place])   . Sleep apnea 1995   on CPAP    MEDICATIONS AT HOME: Prior to Admission medications   Medication Sig Start Date End Date Taking? Authorizing Provider  amLODipine (NORVASC) 5 MG tablet Take 1 tablet (5 mg total) by mouth daily. 01/31/20  Yes Freeman Caldron M, PA-C  atorvastatin (LIPITOR) 40 MG tablet Take 1 tablet (40 mg total) by mouth daily. Must have OV for refills 01/31/20  Yes Samiyah Stupka M, PA-C  Blood Glucose Monitoring Suppl (TRUE METRIX METER) w/Device KIT 1 each by Does not apply route 3 (three) times daily after meals. 10/17/15  Yes Funches, Adriana Mccallum, MD   gabapentin (NEURONTIN) 600 MG tablet 1 pill twice per day and 2 at bedtime 01/31/20  Yes Asuzena Weis M, PA-C  glipiZIDE (GLUCOTROL XL) 10 MG 24 hr tablet Take 1 tablet (10 mg total) by mouth daily with breakfast. To lower blood sugar 01/31/20  Yes Garris Melhorn M, PA-C  Glucose Blood (TRUE METRIX BLOOD GLUCOSE TEST VI)  10/17/15  Yes [provider]  glucose blood (TRUE METRIX BLOOD GLUCOSE TEST) test strip 1 each by Other route 3 (three) times daily. Use as instructed 10/17/15  Yes Funches, Josalyn, MD  glucose blood test strip  10/17/15  Yes [provider]  hydrALAZINE (APRESOLINE) 25 MG tablet 1 in the morning and 1 with lunch 01/31/20  Yes Zienna Ahlin M, PA-C  losartan-hydrochlorothiazide (HYZAAR) 100-25 MG tablet Take 1 tablet by mouth daily. To lower blood pressure 01/31/20  Yes Decklan Mau M, PA-C  metFORMIN (GLUCOPHAGE) 1000 MG tablet Take 1 tablet (1,000 mg total) by mouth 2 (two) times daily with a meal. For diabetes management 01/31/20  Yes Freeman Caldron M, PA-C  pioglitazone (ACTOS) 30 MG tablet Take 1 tablet (30 mg total) by mouth daily. 01/31/20  Yes Freeman Caldron M, PA-C  predniSONE (DELTASONE) 5 MG tablet Take 1.5 tablets (7.5 mg total) by mouth daily with breakfast. 01/31/20  Yes Tameah Mihalko M, PA-C  terbinafine (LAMISIL AT) 1 % cream Apply  1 application topically 2 (two) times daily. 10/23/15  Yes Funches, Adriana Mccallum, MD  TRUEPLUS LANCETS 28G MISC USE AS DIRECTED 10/17/15  Yes Funches, Josalyn, MD  diclofenac (VOLTAREN) 75 MG EC tablet TAKE 1 TABLET BY MOUTH 2 TIMES DAILY AS NEEDED.TAKE WITH FOOD. Patient not taking: Reported on 01/31/2020 01/05/16   Lilia Argue R, DO     Objective:  EXAM:   Vitals:   01/31/20 0956  BP: 114/76  Pulse: 75  Temp: 97.7 F (36.5 C)  TempSrc: Temporal  SpO2: 100%  Weight: 293 lb (132.9 kg)  Height: 6' 3.5" (1.918 m)    General appearance : A&OX3. NAD. Non-toxic-appearing HEENT: Atraumatic and Normocephalic.  PERRLA.  EOM intact.   Neck: supple, no JVD. No cervical lymphadenopathy. No thyromegaly Chest/Lungs:  Breathing-non-labored, Good air entry bilaterally, breath sounds normal without rales, rhonchi, or wheezing  CVS: S1 S2 regular, no murmurs, gallops, rubs  Extremities: Bilateral Lower Ext shows no edema, both legs are warm to touch with = pulse throughout Neurology:  CN II-XII grossly intact, Non focal.   Psych:  TP linear. J/I WNL. Normal speech. Appropriate eye contact and affect.  Skin:  No Rash  Data Review Lab Results  Component Value Date   HGBA1C 7.8 (A) 01/31/2020   HGBA1C 7.5 (A) 11/23/2019   HGBA1C 13.30 10/17/2015     Assessment & Plan   1. Uncontrolled type 2 diabetes mellitus with hyperglycemia (HCC) Resume metformin with other meds.  Follow diabetic diet - POCT glycosylated hemoglobin (Hb A1C) - Glucose (CBG) - atorvastatin (LIPITOR) 40 MG tablet; Take 1 tablet (40 mg total) by mouth daily. Must have OV for refills  Dispense: 30 tablet; Refill: 4 - gabapentin (NEURONTIN) 600 MG tablet; 1 pill twice per day and 2 at bedtime  Dispense: 120 tablet; Refill: 4 - glipiZIDE (GLUCOTROL XL) 10 MG 24 hr tablet; Take 1 tablet (10 mg total) by mouth daily with breakfast. To lower blood sugar  Dispense: 30 tablet; Refill: 4 - metFORMIN (GLUCOPHAGE) 1000 MG tablet; Take 1 tablet (1,000 mg total) by mouth 2 (two) times daily with a meal. For diabetes management  Dispense: 60 tablet; Refill: 4 - pioglitazone (ACTOS) 30 MG tablet; Take 1 tablet (30 mg total) by mouth daily.  Dispense: 30 tablet; Refill: 4  2. Essential hypertension Controlled-can try d/c evening dose to see if it will help with urinatin and assess BP with lowered dose at [facility] - amLODipine (NORVASC) 5 MG tablet; Take 1 tablet (5 mg total) by mouth daily.  Dispense: 30 tablet; Refill: 4 - hydrALAZINE (APRESOLINE) 25 MG tablet; 1 in the morning and 1 with lunch  Dispense: 60 tablet; Refill: 4 - losartan-hydrochlorothiazide  (HYZAAR) 100-25 MG tablet; Take 1 tablet by mouth daily. To lower blood pressure  Dispense: 30 tablet; Refill: 4  3. 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  Dispense: 45 tablet; Refill: 2  4. Substance abuse ([place]) I have counseled the patient at length about substance abuse and addiction.  12 step meetings/recovery recommended.  Local 12 step meeting lists were given and attendance was encouraged.  Patient expresses understanding.   Labs in march reviewed  Patient have been counseled extensively about nutrition and exercise  Return in about 3 months (around 05/02/2020) for PCP.  The patient was given clear instructions to go to ER or return to medical center if symptoms don't improve, worsen or new problems  develop. The patient verbalized understanding. The patient was told to call to get lab results if they haven't heard anything in the next week.     Freeman Caldron, PA-C [facility] [place], [place]   01/31/2020, 10:39 AM

## 2020-01-31 NOTE — Patient Instructions (Signed)
[place] Call these guys!!!!  [email or web address] is the [employer]

## 2020-02-01 ENCOUNTER — Telehealth: Payer: Self-pay | Admitting: Family Medicine

## 2020-02-01 NOTE — Telephone Encounter (Signed)
Trenda Moots from [facility] called requesting clarification on how often the patient needs to check his sugar. She says that the order says 3 times daily after each meal but it looks like the order was back from 2017. Please f/u with Silva Bandy for clarification.

## 2020-02-04 NOTE — Telephone Encounter (Signed)
Called [facility] spoke with Silva Bandy , stated that pt is been refusing to prick his finger 3 times a day and every time he refuses they need to create a incident report. They have a standing order for PRN if pt feeling low or high sugars but they cannot initiate / non medical staff/ Request a clarification w the order. Please advise .

## 2020-02-10 ENCOUNTER — Inpatient Hospital Stay (HOSPITAL_COMMUNITY)
Admit: 2020-02-10 | Discharge: 2020-02-12 | DRG: 885 | Disposition: A | Discharge status: Home / Self Care | Payer: Federal, State, Local not specified - Other | Attending: Psychiatry | Admitting: Psychiatry

## 2020-02-10 DIAGNOSIS — G4700 Insomnia, unspecified: Secondary | ICD-10-CM | POA: Diagnosis present

## 2020-02-10 DIAGNOSIS — E1140 Type 2 diabetes mellitus with diabetic neuropathy, unspecified: Secondary | ICD-10-CM | POA: Diagnosis present

## 2020-02-10 DIAGNOSIS — G4730 Sleep apnea, unspecified: Secondary | ICD-10-CM | POA: Diagnosis present

## 2020-02-10 DIAGNOSIS — E785 Hyperlipidemia, unspecified: Secondary | ICD-10-CM | POA: Diagnosis present

## 2020-02-10 DIAGNOSIS — E230 Hypopituitarism: Secondary | ICD-10-CM | POA: Diagnosis present

## 2020-02-10 DIAGNOSIS — F251 Schizoaffective disorder, depressive type: Principal | ICD-10-CM | POA: Diagnosis present

## 2020-02-10 DIAGNOSIS — Z7984 Long term (current) use of oral hypoglycemic drugs: Secondary | ICD-10-CM

## 2020-02-10 DIAGNOSIS — R45851 Suicidal ideations: Secondary | ICD-10-CM | POA: Diagnosis present

## 2020-02-10 DIAGNOSIS — Z62810 Personal history of physical and sexual abuse in childhood: Secondary | ICD-10-CM | POA: Diagnosis present

## 2020-02-10 DIAGNOSIS — R569 Unspecified convulsions: Secondary | ICD-10-CM | POA: Diagnosis present

## 2020-02-10 DIAGNOSIS — Z833 Family history of diabetes mellitus: Secondary | ICD-10-CM

## 2020-02-10 DIAGNOSIS — Z20822 Contact with and (suspected) exposure to covid-19: Secondary | ICD-10-CM | POA: Diagnosis present

## 2020-02-10 DIAGNOSIS — F259 Schizoaffective disorder, unspecified: Secondary | ICD-10-CM | POA: Diagnosis present

## 2020-02-10 DIAGNOSIS — F209 Schizophrenia, unspecified: Secondary | ICD-10-CM | POA: Insufficient documentation

## 2020-02-10 DIAGNOSIS — I10 Essential (primary) hypertension: Secondary | ICD-10-CM | POA: Diagnosis present

## 2020-02-10 DIAGNOSIS — E1165 Type 2 diabetes mellitus with hyperglycemia: Secondary | ICD-10-CM

## 2020-02-10 DIAGNOSIS — Z8249 Family history of ischemic heart disease and other diseases of the circulatory system: Secondary | ICD-10-CM

## 2020-02-10 DIAGNOSIS — Z79899 Other long term (current) drug therapy: Secondary | ICD-10-CM

## 2020-02-10 DIAGNOSIS — D497 Neoplasm of unspecified behavior of endocrine glands and other parts of nervous system: Secondary | ICD-10-CM

## 2020-02-10 DIAGNOSIS — M1990 Unspecified osteoarthritis, unspecified site: Secondary | ICD-10-CM | POA: Diagnosis present

## 2020-02-10 DIAGNOSIS — F419 Anxiety disorder, unspecified: Secondary | ICD-10-CM | POA: Diagnosis present

## 2020-02-10 MED ORDER — AMLODIPINE BESYLATE 5 MG PO TABS
5.0000 mg | ORAL_TABLET | Freq: Every day | ORAL | Status: DC
  Administered 2020-02-11 – 2020-02-12 (×2): 5 mg via ORAL
  Filled 2020-02-10 (×3): qty 1

## 2020-02-10 MED ORDER — LOSARTAN POTASSIUM 50 MG PO TABS
100.0000 mg | ORAL_TABLET | Freq: Every day | ORAL | Status: DC
  Administered 2020-02-11 – 2020-02-12 (×2): 100 mg via ORAL
  Filled 2020-02-10 (×3): qty 2

## 2020-02-10 MED ORDER — HALOPERIDOL 5 MG PO TABS
5.0000 mg | ORAL_TABLET | Freq: Once | ORAL | Status: AC
  Administered 2020-02-10: 5 mg via ORAL
  Filled 2020-02-10: qty 1

## 2020-02-10 MED ORDER — PIOGLITAZONE HCL 30 MG PO TABS
30.0000 mg | ORAL_TABLET | Freq: Every day | ORAL | Status: DC
  Administered 2020-02-11 – 2020-02-12 (×2): 30 mg via ORAL
  Filled 2020-02-10 (×3): qty 1

## 2020-02-10 MED ORDER — GABAPENTIN 600 MG PO TABS: 600.0000 mg | ORAL_TABLET | Freq: Two times a day (BID) | ORAL | Status: DC

## 2020-02-10 MED ORDER — PREDNISONE 5 MG PO TABS
7.5000 mg | ORAL_TABLET | Freq: Every day | ORAL | Status: DC
  Administered 2020-02-11 – 2020-02-12 (×2): 7.5 mg via ORAL
  Filled 2020-02-10 (×5): qty 1

## 2020-02-10 MED ORDER — HYDROCHLOROTHIAZIDE 25 MG PO TABS
25.0000 mg | ORAL_TABLET | Freq: Every day | ORAL | Status: DC
  Administered 2020-02-11 – 2020-02-12 (×2): 25 mg via ORAL
  Filled 2020-02-10 (×3): qty 1

## 2020-02-10 MED ORDER — ACETAMINOPHEN 325 MG PO TABS: 650.0000 mg | ORAL_TABLET | Freq: Four times a day (QID) | ORAL | Status: DC | PRN

## 2020-02-10 MED ORDER — MAGNESIUM HYDROXIDE 400 MG/5ML PO SUSP: 30.0000 mL | Freq: Every day | ORAL | Status: DC | PRN

## 2020-02-10 MED ORDER — ALUM & MAG HYDROXIDE-SIMETH 200-200-20 MG/5ML PO SUSP: 30.0000 mL | ORAL | Status: DC | PRN

## 2020-02-10 MED ORDER — HYDRALAZINE HCL 25 MG PO TABS
25.0000 mg | ORAL_TABLET | Freq: Two times a day (BID) | ORAL | Status: DC
  Administered 2020-02-11 – 2020-02-12 (×3): 25 mg via ORAL
  Filled 2020-02-10 (×5): qty 1

## 2020-02-10 MED ORDER — ATORVASTATIN CALCIUM 40 MG PO TABS
40.0000 mg | ORAL_TABLET | Freq: Every day | ORAL | Status: DC
  Administered 2020-02-11 – 2020-02-12 (×2): 40 mg via ORAL
  Filled 2020-02-10 (×3): qty 1

## 2020-02-10 MED ORDER — HYDROXYZINE HCL 25 MG PO TABS
25.0000 mg | ORAL_TABLET | Freq: Three times a day (TID) | ORAL | Status: DC | PRN
  Filled 2020-02-10: qty 1

## 2020-02-10 MED ORDER — GABAPENTIN 300 MG PO CAPS
600.0000 mg | ORAL_CAPSULE | Freq: Two times a day (BID) | ORAL | Status: DC
  Administered 2020-02-11 – 2020-02-12 (×3): 600 mg via ORAL
  Filled 2020-02-10 (×6): qty 2

## 2020-02-10 MED ORDER — BENZTROPINE MESYLATE 0.5 MG PO TABS
0.5000 mg | ORAL_TABLET | Freq: Once | ORAL | Status: AC
  Administered 2020-02-10: 0.5 mg via ORAL
  Filled 2020-02-10: qty 1

## 2020-02-10 MED ORDER — GABAPENTIN 400 MG PO CAPS
1200.0000 mg | ORAL_CAPSULE | Freq: Every day | ORAL | Status: DC
  Administered 2020-02-10 – 2020-02-11 (×2): 1200 mg via ORAL
  Filled 2020-02-10 (×3): qty 3

## 2020-02-10 MED ORDER — METFORMIN HCL 500 MG PO TABS
1000.0000 mg | ORAL_TABLET | Freq: Two times a day (BID) | ORAL | Status: DC
  Administered 2020-02-11 – 2020-02-12 (×3): 1000 mg via ORAL
  Filled 2020-02-10 (×5): qty 2

## 2020-02-10 MED ORDER — LOSARTAN POTASSIUM-HCTZ 100-25 MG PO TABS: 1.0000 | ORAL_TABLET | Freq: Every day | ORAL | Status: DC

## 2020-02-10 MED ORDER — GLIPIZIDE ER 5 MG PO TB24
10.0000 mg | ORAL_TABLET | Freq: Every day | ORAL | Status: DC
  Administered 2020-02-11 – 2020-02-12 (×2): 10 mg via ORAL
  Filled 2020-02-10 (×3): qty 1

## 2020-02-10 MED ORDER — TRAZODONE HCL 50 MG PO TABS
50.0000 mg | ORAL_TABLET | Freq: Every evening | ORAL | Status: DC | PRN
  Administered 2020-02-10 – 2020-02-11 (×2): 50 mg via ORAL
  Filled 2020-02-10 (×2): qty 1
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
## 2020-02-11 ENCOUNTER — Encounter (HOSPITAL_COMMUNITY): Payer: Self-pay | Admitting: Nurse Practitioner

## 2020-02-11 ENCOUNTER — Other Ambulatory Visit: Payer: Self-pay

## 2020-02-11 DIAGNOSIS — E1140 Type 2 diabetes mellitus with diabetic neuropathy, unspecified: Secondary | ICD-10-CM | POA: Diagnosis present

## 2020-02-11 DIAGNOSIS — Z79899 Other long term (current) drug therapy: Secondary | ICD-10-CM | POA: Diagnosis not present

## 2020-02-11 DIAGNOSIS — F259 Schizoaffective disorder, unspecified: Secondary | ICD-10-CM | POA: Diagnosis present

## 2020-02-11 DIAGNOSIS — G4730 Sleep apnea, unspecified: Secondary | ICD-10-CM | POA: Diagnosis present

## 2020-02-11 DIAGNOSIS — E785 Hyperlipidemia, unspecified: Secondary | ICD-10-CM | POA: Diagnosis present

## 2020-02-11 DIAGNOSIS — M1990 Unspecified osteoarthritis, unspecified site: Secondary | ICD-10-CM | POA: Diagnosis present

## 2020-02-11 DIAGNOSIS — F251 Schizoaffective disorder, depressive type: Secondary | ICD-10-CM | POA: Diagnosis present

## 2020-02-11 DIAGNOSIS — E230 Hypopituitarism: Secondary | ICD-10-CM | POA: Diagnosis present

## 2020-02-11 DIAGNOSIS — I10 Essential (primary) hypertension: Secondary | ICD-10-CM | POA: Diagnosis present

## 2020-02-11 DIAGNOSIS — R45851 Suicidal ideations: Secondary | ICD-10-CM | POA: Diagnosis present

## 2020-02-11 DIAGNOSIS — F419 Anxiety disorder, unspecified: Secondary | ICD-10-CM | POA: Diagnosis present

## 2020-02-11 DIAGNOSIS — R569 Unspecified convulsions: Secondary | ICD-10-CM | POA: Diagnosis present

## 2020-02-11 DIAGNOSIS — Z833 Family history of diabetes mellitus: Secondary | ICD-10-CM | POA: Diagnosis not present

## 2020-02-11 DIAGNOSIS — Z20822 Contact with and (suspected) exposure to covid-19: Secondary | ICD-10-CM | POA: Diagnosis present

## 2020-02-11 DIAGNOSIS — Z8249 Family history of ischemic heart disease and other diseases of the circulatory system: Secondary | ICD-10-CM | POA: Diagnosis not present

## 2020-02-11 DIAGNOSIS — G4700 Insomnia, unspecified: Secondary | ICD-10-CM | POA: Diagnosis present

## 2020-02-11 DIAGNOSIS — Z62810 Personal history of physical and sexual abuse in childhood: Secondary | ICD-10-CM | POA: Diagnosis present

## 2020-02-11 DIAGNOSIS — Z7984 Long term (current) use of oral hypoglycemic drugs: Secondary | ICD-10-CM | POA: Diagnosis not present

## 2020-02-11 LAB — COMPREHENSIVE METABOLIC PANEL
ALT: 22 U/L (ref 0–44)
AST: 20 U/L (ref 15–41)
Albumin: 3.8 g/dL (ref 3.5–5.0)
Alkaline Phosphatase: 60 U/L (ref 38–126)
Anion gap: 13 (ref 5–15)
BUN: 14 mg/dL (ref 6–20)
CO2: 24 mmol/L (ref 22–32)
Calcium: 9.5 mg/dL (ref 8.9–10.3)
Chloride: 104 mmol/L (ref 98–111)
Creatinine, Ser: 0.85 mg/dL (ref 0.61–1.24)
GFR calc Af Amer: >60 mL/min (ref >60)
GFR calc non Af Amer: >60 mL/min (ref >60)
Glucose, Bld: 113 mg/dL — ABNORMAL HIGH (ref 70–99)
Potassium: 4.2 mmol/L (ref 3.5–5.1)
Sodium: 141 mmol/L (ref 135–145)
Total Bilirubin: 0.6 mg/dL (ref 0.3–1.2)
Total Protein: 6.8 g/dL (ref 6.5–8.1)

## 2020-02-11 LAB — GLUCOSE, CAPILLARY
Glucose-Capillary: 132 mg/dL — ABNORMAL HIGH (ref 70–99)
Glucose-Capillary: 148 mg/dL — ABNORMAL HIGH (ref 70–99)
Glucose-Capillary: 105 mg/dL — ABNORMAL HIGH (ref 70–99)

## 2020-02-11 LAB — CBC
HCT: 45.9 % (ref 39.0–52.0)
Hemoglobin: 14.1 g/dL (ref 13.0–17.0)
MCH: 23.7 pg — ABNORMAL LOW (ref 26.0–34.0)
MCHC: 30.7 g/dL (ref 30.0–36.0)
MCV: 77.1 fL — ABNORMAL LOW (ref 80.0–100.0)
Platelets: 268 10*3/uL (ref 150–400)
RBC: 5.95 MIL/uL — ABNORMAL HIGH (ref 4.22–5.81)
RDW: 15.2 % (ref 11.5–15.5)
WBC: 8.5 10*3/uL (ref 4.0–10.5)
nRBC: 0 % (ref 0.0–0.2)

## 2020-02-11 LAB — SARS CORONAVIRUS 2 BY RT PCR (HOSPITAL ORDER, PERFORMED IN ~~LOC~~ HOSPITAL LAB): SARS Coronavirus 2: NEGATIVE

## 2020-02-11 LAB — LIPID PANEL
Cholesterol: 115 mg/dL (ref 0–200)
HDL: 47 mg/dL (ref >40)
LDL Cholesterol: 51 mg/dL (ref 0–99)
Total CHOL/HDL Ratio: 2.4 RATIO
Triglycerides: 85 mg/dL (ref <150)
VLDL: 17 mg/dL (ref 0–40)

## 2020-02-11 MED ORDER — HALOPERIDOL 5 MG PO TABS
5.0000 mg | ORAL_TABLET | Freq: Two times a day (BID) | ORAL | Status: DC
  Administered 2020-02-11 – 2020-02-12 (×2): 5 mg via ORAL
  Filled 2020-02-11 (×5): qty 1

## 2020-02-11 MED ORDER — HALOPERIDOL DECANOATE 100 MG/ML IM SOLN
100.0000 mg | Freq: Once | INTRAMUSCULAR | Status: AC
  Administered 2020-02-11: 100 mg via INTRAMUSCULAR
  Filled 2020-02-11: qty 1

## 2020-02-11 MED ORDER — INSULIN ASPART 100 UNIT/ML ~~LOC~~ SOLN: 3.0000 [IU] | Freq: Three times a day (TID) | SUBCUTANEOUS | Status: DC

## 2020-02-11 MED ORDER — INSULIN ASPART 100 UNIT/ML ~~LOC~~ SOLN: 0.0000 [IU] | Freq: Three times a day (TID) | SUBCUTANEOUS | Status: DC

## 2020-02-11 NOTE — BHH Suicide Risk Assessment (Signed)
[facility] Admission Suicide Risk Assessment   Nursing information obtained from:  Patient, Review of record Demographic factors:  Male Current Mental Status:  Suicidal ideation indicated by patient, Thoughts of violence towards others Loss Factors:  NA Historical Factors:  Prior suicide attempts, Impulsivity Risk Reduction Factors:  Positive social support, Positive therapeutic relationship, Employed, Sense of responsibility to family, Living with another person, especially a relative, Positive coping skills or problem solving skills  Total Time spent with patient: 30 minutes Principal Problem: Schizoaffective disorder, depressive type ([place]) Diagnosis:  Principal Problem:   Schizoaffective disorder, depressive type ([place]) Active Problems:   Schizoaffective disorder ([place])  Subjective Data: Patient is seen and examined.  Patient is a 44 year old male who was brought from the [facility] residential treatment program with a history of schizophrenia.  The patient stated he usually receives a monthly Haldol injection.  He stated the last time he received the injection was while he was incarcerated.  He apparently had been incarcerated several times totaling 28 years.  He had been most recently incarcerated for 45 months for robbery and assault.  He was released in January 2021.  He told the folks at [facility] that he "felt like an episode was coming on".  He stated these episodes come on and he wants to hurt himself and others.  He started thinking about hanging himself with a blanket at the residential program.  He was brought to the hospital for evaluation.  The decision was made to admit him to the hospital for evaluation and stabilization.  His last psychiatric hospitalization at our facility was in 2017.  His diagnosis at that time included schizoaffective disorder; depressive type.  His psychiatric medications at the time of discharge included Cogentin, clonidine, gabapentin, Haldol and the Haldol decanoate  injection.  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  He remains on prednisone for that.  He was admitted to the hospital for evaluation and stabilization.  Continued Clinical Symptoms:  Alcohol Use Disorder Identification Test Final Score (AUDIT): 3 The "Alcohol Use Disorders Identification Test", Guidelines for Use in Primary Care, Second Edition.  World Pharmacologist [facility]). Score between 0-7:  no or low risk or alcohol related problems. Score between 8-15:  moderate risk of alcohol related problems. Score between 16-19:  high risk of alcohol related problems. Score 20 or above:  warrants further diagnostic evaluation for alcohol dependence and treatment.   CLINICAL FACTORS:   Alcohol/Substance Abuse/Dependencies Schizophrenia:   Depressive state Paranoid or undifferentiated type   Musculoskeletal: Strength & Muscle Tone: within normal limits Gait & Station: normal Patient leans: N/A  Psychiatric Specialty Exam: Physical Exam  Nursing note and vitals reviewed. Constitutional: He is oriented to person, place, and time.  HENT:  Head: Normocephalic and atraumatic.  Respiratory: Effort normal.  GI: Normal appearance.  Neurological: He is alert and oriented to person, place, and time.    Review of Systems  Blood pressure (!) 122/96, pulse 96, temperature 97.7 F (36.5 C), temperature source Oral, resp. rate 16, height 6\' 4"  (1.93 m), weight 135.2 kg, SpO2 100 %.Body mass index is 36.27 kg/m.  General Appearance: Disheveled  Eye Contact:  Fair  Speech:  Normal Rate  Volume:  Normal  Mood:  Dysphoric  Affect:  Congruent  Thought Process:  Coherent and Descriptions of Associations: Loose  Orientation:  Full (Time, Place, and Person)  Thought Content:  Delusions, Hallucinations: Auditory and Paranoid  Ideation  Suicidal Thoughts:  Yes.  without intent/plan  Homicidal Thoughts:   Yes.  without intent/plan  Memory:  Immediate;   Fair Recent;   Fair Remote;   Fair  Judgement:  Intact  Insight:  Fair  Psychomotor Activity:  Normal  Concentration:  Concentration: Fair and Attention Span: Fair  Recall:  [employer] of Knowledge:  Fair  Language:  Good  Akathisia:  Negative  Handed:  Right  AIMS (if indicated):     Assets:  Desire for Improvement Resilience  ADL's:  Intact  Cognition:  WNL  Sleep:         COGNITIVE FEATURES THAT CONTRIBUTE TO RISK:  None    SUICIDE RISK:   Moderate:  Frequent suicidal ideation with limited intensity, and duration, some specificity in terms of plans, no associated intent, good self-control, limited dysphoria/symptomatology, some risk factors present, and identifiable protective factors, including available and accessible social support.  PLAN OF CARE: Patient is seen and examined.  Patient is a 44 year old male with the above-stated past psychiatric history who was admitted secondary to suicidal and homicidal ideation as well as paranoid delusions.  He will be admitted to the hospital.  He will be integrated in the milieu.  He will be encouraged to attend groups.  I will go on and write for the 100 mg Haldol Decanoate injection for today.  We will continue his amlodipine, Lipitor, gabapentin, glipizide, hydralazine, Metformin, Actos and prednisone as previously written.  We will also start haloperidol 10 mg p.o. twice daily to allow time for the Haldol decanoate injection to take place.  He is also been on lamotrigine, but we will track down and see if that needs to be continued.  His prednisone will be continued at 7.5 mg p.o. daily.  His sertraline will be restarted 150 mg p.o. daily.  We will also continue trazodone at 100 mg p.o. nightly.  Currently his vital signs are stable, and he is afebrile.  His most recent CIWA this a.m. was 0.  Review of his admission laboratories revealed a mildly elevated glucose at 113.  This a.m. in the  hospital it was 132.  Liver function enzymes were normal.  Lipids were normal.  His CBC showed a mild decrease his MCV and MCH.  We will start folic acid and thiamine for that.  His hemoglobin A1c was 7.8.  We will adjust his insulin regarding that.  Drug screen is not been done.  I certify that inpatient services furnished can reasonably be expected to improve the patient's condition.   Sharma Covert, MD 02/11/2020, 2:44 PM

## 2020-02-11 NOTE — H&P (Signed)
Psychiatric Admission Assessment Adult  Patient Identification: Duane Salazar MRN:  [id number] Date of Evaluation:  02/11/2020 Chief Complaint:  Schizophrenia ([place]) [F20.9] Schizoaffective disorder ([place]) [F25.9] Principal Diagnosis: Schizoaffective disorder, depressive type ([place]) Diagnosis:  Principal Problem:   Schizoaffective disorder, depressive type ([place]) Active Problems:   Schizoaffective disorder ([place])  History of Present Illness: Patient is seen and examined.  Patient is a 44 year old male who was brought from the [facility] residential treatment program with a history of schizophrenia.  The patient stated he usually receives a monthly Haldol injection.  He stated the last time he received the injection was while he was incarcerated.  He apparently had been incarcerated several times totaling 28 years.  He had been most recently incarcerated for 45 months for robbery and assault.  He was released in January 2021.  He told the folks at [facility] that he "felt like an episode was coming on".  He stated these episodes come on and he wants to hurt himself and others.  He started thinking about hanging himself with a blanket at the residential program.  He was brought to the hospital for evaluation.  The decision was made to admit him to the hospital for evaluation and stabilization.  His last psychiatric hospitalization at our facility was in 2017.  His diagnosis at that time included schizoaffective disorder; depressive type.  His psychiatric medications at the time of discharge included Cogentin, clonidine, gabapentin, Haldol and the Haldol decanoate injection.  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  He remains on prednisone for that.  He was admitted to the hospital for evaluation and stabilization.  Associated Signs/Symptoms: Depression Symptoms:  depressed  mood, anhedonia, insomnia, fatigue, feelings of worthlessness/guilt, difficulty concentrating, hopelessness, suicidal thoughts without plan, anxiety, loss of energy/fatigue, disturbed sleep, (Hypo) Manic Symptoms:  Delusions, Hallucinations, Impulsivity, Labiality of Mood, Anxiety Symptoms:  Excessive Worry, Psychotic Symptoms:  Delusions, Hallucinations: Auditory Paranoia, PTSD Symptoms: Negative Total Time spent with patient: 45 minutes  Past Psychiatric History: The patient most recently was treated for his illness while incarcerated.  He was released in January 2021.  He last received his long-acting Haldol injection while he was incarcerated.  Review of the electronic medical record revealed his last psychiatric hospitalization at our facility was on 09/18/2015.  He was diagnosed with schizoaffective disorder at that time.  His discharge medications at that time included gabapentin, Haldol, Haldol Decanoate, Lamictal, sertraline, trazodone.  Is the patient at risk to self? Yes.    Has the patient been a risk to self in the past 6 months? Yes.    Has the patient been a risk to self within the distant past? Yes.    Is the patient a risk to others? Yes.    Has the patient been a risk to others in the past 6 months? Yes.    Has the patient been a risk to others within the distant past? Yes.     Prior Inpatient Therapy: Prior Inpatient Therapy: Yes Prior Therapy Dates: 08/2015, multiple admits Prior Therapy Facilty/Provider(s): [facility], other facilities Reason for Treatment: Schizophrenia Prior Outpatient Therapy: Prior Outpatient Therapy: Yes Prior Therapy Dates: 2017 Prior Therapy Facilty/Provider(s): [name] Reason for Treatment: Medication management Does patient have an ACCT team?: No Does patient have Intensive In-House Services?  : No Does patient have [name] services? : No Does patient have P4CC services?: No  Alcohol Screening: 1. How often do you have  a drink  containing alcohol?: 2 to 4 times a month 2. How many drinks containing alcohol do you have on a typical day when you are drinking?: 1 or 2 3. How often do you have six or more drinks on one occasion?: Less than monthly AUDIT-C Score: 3 4. How often during the last year have you found that you were not able to stop drinking once you had started?: Never 5. How often during the last year have you failed to do what was normally expected from you because of drinking?: Never 6. How often during the last year have you needed a first drink in the morning to get yourself going after a heavy drinking session?: Never 7. How often during the last year have you had a feeling of guilt of remorse after drinking?: Never 8. How often during the last year have you been unable to remember what happened the night before because you had been drinking?: Never 9. Have you or someone else been injured as a result of your drinking?: No 10. Has a relative or friend or a doctor or another health worker been concerned about your drinking or suggested you cut down?: No Alcohol Use Disorder Identification Test Final Score (AUDIT): 3 Alcohol Brief Interventions/Follow-up: AUDIT Score <7 follow-up not indicated Substance Abuse History in the last 12 months:  Yes.   Consequences of Substance Abuse: Negative Previous Psychotropic Medications: Yes  Psychological Evaluations: Yes  Past Medical History:  Past Medical History:  Diagnosis Date  . 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 AKI (acute kidney injury) ([place]) 03/21/2015  . Arthritis Dx 2002  . Diabetes type 2, controlled ([place]) 2010  . Headache   . Hypertension Dx 2002  . 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 Schizo affective schizophrenia ([place]) 1995  . Schizophrenia ([place])   . Seizures ([place])   . Sleep apnea 1995   on CPAP    Past Surgical History:  Procedure Laterality Date  . PITUITARY SURGERY  2005 & 2012  . SKIN BIOPSY     Family History:  Family History  Problem  Relation Age of Onset  . Hypertension Mother   . Diabetes Mother   . Heart Problems Mother   . Cancer Maternal Uncle   . Alcoholism Maternal Uncle   . Cancer Maternal Grandmother   . Heart disease Neg Hx    Family Psychiatric  History: His uncle has alcoholism. Tobacco Screening:   Social History:  Social History   Substance and Sexual Activity  Alcohol Use Yes  . Alcohol/week: 2.0 standard drinks  . Types: 2 Cans of beer per week   Comment: 2 40s a day     Social History   Substance and Sexual Activity  Drug Use Yes  . Types: Cocaine, Marijuana, Heroin   Comment: Reports used 3 weeks ago. 10/17/2015    Additional Social History: Marital status: Single    Pain Medications: Denies abuse Prescriptions: Denies abuse Over the Counter: Denies abuse History of alcohol / drug use?: Yes Longest period of sobriety (when/how long): several years while incarcerated Negative Consequences of Use: Legal Name of Substance 1: Alcohol 1 - Age of First Use: Adolescent 1 - Amount (size/oz): Varies 1 - Frequency: 2-3 times per week 1 - Duration: Ongoing 1 - Last Use / Amount: 24 days ago Name of Substance 2: Marijuana 2 - Age of First Use: Adolescent 2 - Amount (size/oz): Varies 2 - Frequency: 2-3 times per week 2 - Duration: Ongoing 2 - Last Use /  Amount: 24 days ago Name of Substance 3: Cocaine 3 - Age of First Use: Adolescent 3 - Amount (size/oz): Varies 3 - Frequency: 3-4 times per month 3 - Duration: Ongoing 3 - Last Use / Amount: 24 days ago              Allergies:  No Known Allergies Lab Results:  Results for orders placed or performed during the hospital encounter of 02/10/20 (from the past 48 hour(s))  SARS Coronavirus 2 by RT PCR (hospital order, performed in [facility] hospital lab) Nasopharyngeal Nasopharyngeal Swab     Status: None   Collection Time: 02/10/20 10:46 PM   Specimen: Nasopharyngeal Swab  Result Value Ref Range   SARS Coronavirus 2 NEGATIVE  NEGATIVE    Comment: (NOTE) SARS-CoV-2 target nucleic acids are NOT DETECTED.  The SARS-CoV-2 RNA is generally detectable in upper and lower respiratory specimens during the acute phase of infection. The lowest concentration of SARS-CoV-2 viral copies this assay can detect is 250 copies / mL. A negative result does not preclude SARS-CoV-2 infection and should not be used as the sole basis for treatment or other patient management decisions.  A negative result may occur with improper specimen collection / handling, submission of specimen other than nasopharyngeal swab, presence of viral mutation(s) within the areas targeted by this assay, and inadequate number of viral copies (<250 copies / mL). A negative result must be combined with clinical observations, patient history, and epidemiological information.  Fact Sheet for Patients:   StrictlyIdeas.no  Fact Sheet for Healthcare Providers: BankingDealers.co.za  This test is not yet approved or  cleared by the [place] FDA and has been authorized for detection and/or diagnosis of SARS-CoV-2 by FDA under an Emergency Use Authorization (EUA).  This EUA will remain in effect (meaning this test can be used) for the duration of the COVID-19 declaration under Section 564(b)(1) of the Act, 21 U.S.C. section 360bbb-3(b)(1), unless the authorization is terminated or revoked sooner.  Performed at [facility], [place] [address], [place], [place] [address]   Glucose, capillary     Status: Abnormal   Collection Time: 02/11/20  6:05 AM  Result Value Ref Range   Glucose-Capillary 132 (H) 70 - 99 mg/dL    Comment: Glucose reference range applies only to samples taken after fasting for at least 8 hours.  CBC     Status: Abnormal   Collection Time: 02/11/20  6:44 AM  Result Value Ref Range   WBC 8.5 4.0 - 10.5 K/uL   RBC 5.95 (H) 4.22 - 5.81 MIL/uL   Hemoglobin 14.1 13.0 - 17.0  g/dL   HCT 45.9 39 - 52 %   MCV 77.1 (L) 80.0 - 100.0 fL   MCH 23.7 (L) 26.0 - 34.0 pg   MCHC 30.7 30.0 - 36.0 g/dL   RDW 15.2 11.5 - 15.5 %   Platelets 268 150 - 400 K/uL   nRBC 0.0 0.0 - 0.2 %    Comment: Performed at [facility], [place] [address], [place], [place] [address]  Comprehensive metabolic panel     Status: Abnormal   Collection Time: 02/11/20  6:44 AM  Result Value Ref Range   Sodium 141 135 - 145 mmol/L   Potassium 4.2 3.5 - 5.1 mmol/L   Chloride 104 98 - 111 mmol/L   CO2 24 22 - 32 mmol/L   Glucose, Bld 113 (H) 70 - 99 mg/dL    Comment: Glucose reference range applies only  to samples taken after fasting for at least 8 hours.   BUN 14 6 - 20 mg/dL   Creatinine, Ser 0.85 0.61 - 1.24 mg/dL   Calcium 9.5 8.9 - 10.3 mg/dL   Total Protein 6.8 6.5 - 8.1 g/dL   Albumin 3.8 3.5 - 5.0 g/dL   AST 20 15 - 41 U/L   ALT 22 0 - 44 U/L   Alkaline Phosphatase 60 38 - 126 U/L   Total Bilirubin 0.6 0.3 - 1.2 mg/dL   GFR calc non Af Amer >60 >60 mL/min   GFR calc Af Amer >60 >60 mL/min   Anion gap 13 5 - 15    Comment: Performed at [facility], [place] [address] [place], [place] [address]  Lipid panel     Status: None   Collection Time: 02/11/20  6:44 AM  Result Value Ref Range   Cholesterol 115 0 - 200 mg/dL   Triglycerides 85 <150 mg/dL   HDL 47 >40 mg/dL   Total CHOL/HDL Ratio 2.4 RATIO   VLDL 17 0 - 40 mg/dL   LDL Cholesterol 51 0 - 99 mg/dL    Comment:        Total Cholesterol/HDL:CHD Risk Coronary Heart Disease Risk Table                     Men   Women  1/2 Average Risk   3.4   3.3  Average Risk       5.0   4.4  2 X Average Risk   9.6   7.1  3 X Average Risk  23.4   11.0        Use the calculated Patient Ratio above and the CHD Risk Table to determine the patient's CHD Risk.        ATP III CLASSIFICATION (LDL):  <100     mg/dL   Optimal  100-129  mg/dL   Near or Above                    Optimal  130-159  mg/dL    Borderline  160-189  mg/dL   High  >190     mg/dL   Very High Performed at [place] [address], [place], [place] [address]     Blood Alcohol level:  Lab Results  Component Value Date   ETH <5 09/15/2015   ETH 75 (H) 77/93/9030    Metabolic Disorder Labs:  Lab Results  Component Value Date   HGBA1C 7.8 (A) 01/31/2020   No results found for: PROLACTIN Lab Results  Component Value Date   CHOL 115 02/11/2020   TRIG 85 02/11/2020   HDL 47 02/11/2020   CHOLHDL 2.4 02/11/2020   VLDL 17 02/11/2020   LDLCALC 51 02/11/2020   LDLCALC 96 10/21/2014    Current Medications: Current Facility-Administered Medications  Medication Dose Route Frequency Provider Last Rate Last Admin  . acetaminophen (TYLENOL) tablet 650 mg  650 mg Oral Q6H PRN Lindon Romp A, NP      . alum & mag hydroxide-simeth (MAALOX/MYLANTA) 200-200-20 MG/5ML suspension 30 mL  30 mL Oral Q4H PRN Lindon Romp A, NP      . amLODipine (NORVASC) tablet 5 mg  5 mg Oral Daily Lindon Romp A, NP   5 mg at 02/11/20 0912  . atorvastatin (LIPITOR) tablet 40 mg  40 mg Oral Daily Lindon Romp A, NP   40 mg at 02/11/20 0914  .  gabapentin (NEURONTIN) capsule 1,200 mg  1,200 mg Oral QHS Lindon Romp A, NP   1,200 mg at 02/10/20 2324  . gabapentin (NEURONTIN) capsule 600 mg  600 mg Oral BID Hampton Abbot, MD   600 mg at 02/11/20 0914  . glipiZIDE (GLUCOTROL XL) 24 hr tablet 10 mg  10 mg Oral Q breakfast Lindon Romp A, NP   10 mg at 02/11/20 0912  . haloperidol (HALDOL) tablet 5 mg  5 mg Oral BID Sharma Covert, MD      . hydrALAZINE (APRESOLINE) tablet 25 mg  25 mg Oral BID Lindon Romp A, NP   25 mg at 02/11/20 0916  . losartan (COZAAR) tablet 100 mg  100 mg Oral Daily Hampton Abbot, MD   100 mg at 02/11/20 0914   And  . hydrochlorothiazide (HYDRODIURIL) tablet 25 mg  25 mg Oral Daily Hampton Abbot, MD   25 mg at 02/11/20 0914  . hydrOXYzine (ATARAX/VISTARIL) tablet 25 mg  25 mg Oral TID PRN Lindon Romp A, NP      . insulin aspart (novoLOG) injection 0-20 Units  0-20 Units Subcutaneous TID WC Sharma Covert, MD      . insulin aspart (novoLOG) injection 3 Units  3 Units Subcutaneous TID WC Sharma Covert, MD      . magnesium hydroxide (MILK OF MAGNESIA) suspension 30 mL  30 mL Oral Daily PRN Lindon Romp A, NP      . metFORMIN (GLUCOPHAGE) tablet 1,000 mg  1,000 mg Oral BID WC Lindon Romp A, NP   1,000 mg at 02/11/20 0914  . pioglitazone (ACTOS) tablet 30 mg  30 mg Oral Daily Lindon Romp A, NP   30 mg at 02/11/20 0913  . predniSONE (DELTASONE) tablet 7.5 mg  7.5 mg Oral Q breakfast Lindon Romp A, NP   7.5 mg at 02/11/20 0915  . traZODone (DESYREL) tablet 50 mg  50 mg Oral QHS PRN Lindon Romp A, NP   50 mg at 02/10/20 2324   PTA Medications: Medications Prior to Admission  Medication Sig Dispense Refill Last Dose  . amLODipine (NORVASC) 5 MG tablet Take 1 tablet (5 mg total) by mouth daily. 30 tablet 4 Unknown at Unknown time  . atorvastatin (LIPITOR) 40 MG tablet Take 1 tablet (40 mg total) by mouth daily. Must have OV for refills 30 tablet 4 Unknown at Unknown time  . gabapentin (NEURONTIN) 600 MG tablet 1 pill twice per day and 2 at bedtime 120 tablet 4 Unknown at Unknown time  . glipiZIDE (GLUCOTROL XL) 10 MG 24 hr tablet Take 1 tablet (10 mg total) by mouth daily with breakfast. To lower blood sugar 30 tablet 4 Unknown at Unknown time  . hydrALAZINE (APRESOLINE) 25 MG tablet Take 1 tablet by mouth 3 (three) times daily.     Marland Kitchen losartan-hydrochlorothiazide (HYZAAR) 100-25 MG tablet Take 1 tablet by mouth daily. To lower blood pressure 30 tablet 4 Unknown at Unknown time  . metFORMIN (GLUCOPHAGE) 1000 MG tablet Take 1 tablet (1,000 mg total) by mouth 2 (two) times daily with a meal. For diabetes management 60 tablet 4 Unknown at Unknown time  . pioglitazone (ACTOS) 30 MG tablet Take 1 tablet (30 mg total) by mouth daily. 30 tablet 4 Unknown at Unknown time  . predniSONE  (DELTASONE) 5 MG tablet Take 1.5 tablets (7.5 mg total) by mouth daily with breakfast. 45 tablet 2 Unknown at Unknown time  . terbinafine (LAMISIL AT) 1 % cream Apply 1  application topically 2 (two) times daily. 42 g 0 Unknown at Unknown time    Musculoskeletal: Strength & Muscle Tone: within normal limits Gait & Station: normal Patient leans: N/A  Psychiatric Specialty Exam: Physical Exam  Nursing note and vitals reviewed. Constitutional: He is oriented to person, place, and time.  HENT:  Head: Normocephalic and atraumatic.  Respiratory: Effort normal.  GI: Normal appearance.  Neurological: He is alert and oriented to person, place, and time.    Review of Systems  Blood pressure (!) 122/96, pulse 96, temperature 97.7 F (36.5 C), temperature source Oral, resp. rate 16, height 6\' 4"  (1.93 m), weight 135.2 kg, SpO2 100 %.Body mass index is 36.27 kg/m.  General Appearance: Disheveled  Eye Contact:  Fair  Speech:  Normal Rate  Volume:  Normal  Mood:  Dysphoric  Affect:  Congruent  Thought Process:  Coherent and Descriptions of Associations: Circumstantial  Orientation:  Full (Time, Place, and Person)  Thought Content:  Delusions, Hallucinations: Auditory and Paranoid Ideation  Suicidal Thoughts:  Yes.  without intent/plan  Homicidal Thoughts:  Yes.  without intent/plan  Memory:  Immediate;   Fair Recent;   Fair Remote;   Fair  Judgement:  Intact  Insight:  Fair  Psychomotor Activity:  Decreased  Concentration:  Concentration: Fair and Attention Span: Fair  Recall:  [employer] of Knowledge:  Fair  Language:  Good  Akathisia:  Negative  Handed:  Right  AIMS (if indicated):     Assets:  Desire for Improvement Resilience  ADL's:  Intact  Cognition:  WNL  Sleep:       Treatment Plan Summary: Daily contact with patient to assess and evaluate symptoms and progress in treatment, Medication management and Plan  secondary to suicidal and homicidal ideation as well as  paranoid delusions.  He will be admitted to the hospital.  He will be integrated in the milieu.  He will be encouraged to attend groups.  I will go on and write for the 100 mg Haldol Decanoate injection for today.  We will continue his amlodipine, Lipitor, gabapentin, glipizide, hydralazine, Metformin, Actos and prednisone as previously written.  We will also start haloperidol 10 mg p.o. twice daily to allow time for the Haldol decanoate injection to take place.  He is also been on lamotrigine, but we will track down and see if that needs to be continued.  His prednisone will be continued at 7.5 mg p.o. daily.  His sertraline will be restarted 150 mg p.o. daily.  We will also continue trazodone at 100 mg p.o. nightly.  Currently his vital signs are stable, and he is afebrile.  His most recent CIWA this a.m. was 0.  Review of his admission laboratories revealed a mildly elevated glucose at 113.  This a.m. in the hospital it was 132.  Liver function enzymes were normal.  Lipids were normal.  His CBC showed a mild decrease his MCV and MCH.  We will start folic acid and thiamine for that.  His hemoglobin A1c was 7.8.  We will adjust his insulin regarding that.  Drug screen is not been done.  Observation Level/Precautions:  15 minute checks  Laboratory:  Chemistry Profile  Psychotherapy:    Medications:    Consultations:    Discharge Concerns:    Estimated LOS:  Other:     Physician Treatment Plan for Primary Diagnosis: Schizoaffective disorder, depressive type ([place]) Long Term Goal(s): Improvement in symptoms so as ready for discharge  Short Term Goals:  Ability to identify changes in lifestyle to reduce recurrence of condition will improve, Ability to verbalize feelings will improve, Ability to disclose and discuss suicidal ideas, Ability to demonstrate self-control will improve, Ability to identify and develop effective coping behaviors will improve, Ability to maintain clinical measurements within normal  limits will improve, Compliance with prescribed medications will improve and Ability to identify triggers associated with substance abuse/mental health issues will improve  Physician Treatment Plan for Secondary Diagnosis: Principal Problem:   Schizoaffective disorder, depressive type ([place]) Active Problems:   Schizoaffective disorder ([place])  Long Term Goal(s): Improvement in symptoms so as ready for discharge  Short Term Goals: Ability to identify changes in lifestyle to reduce recurrence of condition will improve, Ability to verbalize feelings will improve, Ability to disclose and discuss suicidal ideas, Ability to demonstrate self-control will improve, Ability to identify and develop effective coping behaviors will improve, Ability to maintain clinical measurements within normal limits will improve, Compliance with prescribed medications will improve and Ability to identify triggers associated with substance abuse/mental health issues will improve  I certify that inpatient services furnished can reasonably be expected to improve the patient's condition.    Sharma Covert, MD 6/14/20213:31 PM
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
## 2020-02-11 NOTE — Progress Notes (Signed)
   02/11/20 2100  Psych Admission Type (Psych Patients Only)  Admission Status Voluntary  Psychosocial Assessment  Patient Complaints Depression  Eye Contact Fair  Facial Expression Flat  Affect Anxious;Depressed  Speech Logical/coherent  Interaction Assertive  Motor Activity Slow  Appearance/Hygiene Unremarkable  Behavior Characteristics Cooperative  Mood Depressed  Thought Process  Coherency WDL  Content WDL  Delusions WDL  Perception WDL  Hallucination None reported or observed  Judgment Impaired  Confusion None  Danger to Self  Current suicidal ideation? Denies  Self-Injurious Behavior No self-injurious ideation or behavior indicators observed or expressed   Agreement Not to Harm Self No  Danger to Others  Danger to Others Reported or observed  Danger to Others Abnormal  Harmful Behavior to others Threats of violence towards other people observed or expressed   Destructive Behavior No threats or harm toward property

## 2020-02-11 NOTE — Progress Notes (Signed)
Recreation Therapy Notes  Date: 6.14.21 Time: 1000 Location: 500 Hall Dayroom  Group Topic: Communication  Goal Area(s) Addresses:  Patient will identify biggest triggers. Patient will identify strategies to avoid exposure to triggers Patient will identify strategies to deal with triggers head on.  Intervention: Worksheet, pencils   Activity: Triggers.  Patients were to identify their biggest triggers.  Patients then identified ways to avoid those triggers and identify strategies to deal with triggers head on when unable to be avoided.  Education: Communication, Discharge Planning  Education Outcome: Acknowledges understanding/In group clarification offered/Needs additional education.   Clinical Observations/Feedback: Pt did not attend group session.    Victorino Sparrow, LRT/CTRS         Victorino Sparrow A 02/11/2020 12:22 PM

## 2020-02-11 NOTE — Progress Notes (Signed)
Recreation Therapy Notes  INPATIENT RECREATION THERAPY ASSESSMENT  Patient Details Name: Duane Salazar MRN: [id number] DOB: [birth year] Today's Date: 02/11/2020       Information Obtained From: Patient  Able to Participate in Assessment/Interview: Yes  Patient Presentation: Alert  Reason for Admission (Per Patient): Other (Comments) (Pt stated he had a mental episode and needed to get on medications.)  Patient Stressors: Other (Comment) (Pt stated he felt an episode coming on)  Coping Skills:   Isolation, TV, Sports, Music, Exercise, Meditate, Substance Abuse, Impulsivity, Talk, Art, Prayer, Avoidance, Read, Hot Bath/Shower  Leisure Interests (2+):  Individual - TV, Nature - Fishing, Social - Family, Individual - Other (Comment) (Meditate, Play with grandkids)  Frequency of Recreation/Participation: Other (Comment) (TV-Daily; Fishing- Every few months; Grandkids- Every other weekend)  Awareness of Community Resources:  Yes  Community Resources:  [place], [place], Other (Comment) ([place])  Current Use: Yes  If no, Barriers?:    Expressed Interest in [place]: No  County of Residence:  [place]  Patient Main Form of Transportation: Musician  Patient Strengths:  Spirituality  Patient Identified Areas of Improvement:  None  Patient Goal for Hospitalization:  "get medication started"  Current SI (including self-harm):  No  Current HI:  No  Current AVH: No  Staff Intervention Plan: Group Attendance, Collaborate with Interdisciplinary Treatment Team  Consent to Intern Participation: N/A    Victorino Sparrow, LRT/CTRS   Victorino Sparrow A 02/11/2020, 12:44 PM

## 2020-02-11 NOTE — Progress Notes (Signed)
Patient ID: Duane Salazar, male   DOB: [birth year], 44 y.o.   MRN: [id number] Pt A&O x 4, presents with SI, depression, plan to hang self with blanket.  Hi thoughts noted towards others also.  Skin search completed, monitoring for safety, no distress noted. Clam & cooperative.

## 2020-02-11 NOTE — Tx Team (Signed)
Interdisciplinary Treatment and Diagnostic Plan Update  02/11/2020 Time of Session: 9:00am Duane Salazar MRN: [id number]  Principal Diagnosis: Schizoaffective disorder, depressive type ([place])  Secondary Diagnoses: Principal Problem:   Schizoaffective disorder, depressive type ([place]) Active Problems:   Schizoaffective disorder ([place])   Current Medications:  Current Facility-Administered Medications  Medication Dose Route Frequency Provider Last Rate Last Admin  . acetaminophen (TYLENOL) tablet 650 mg  650 mg Oral Q6H PRN Lindon Romp A, NP      . alum & mag hydroxide-simeth (MAALOX/MYLANTA) 200-200-20 MG/5ML suspension 30 mL  30 mL Oral Q4H PRN Lindon Romp A, NP      . amLODipine (NORVASC) tablet 5 mg  5 mg Oral Daily Lindon Romp A, NP   5 mg at 02/11/20 0912  . atorvastatin (LIPITOR) tablet 40 mg  40 mg Oral Daily Lindon Romp A, NP   40 mg at 02/11/20 0914  . gabapentin (NEURONTIN) capsule 1,200 mg  1,200 mg Oral QHS Lindon Romp A, NP   1,200 mg at 02/10/20 2324  . gabapentin (NEURONTIN) capsule 600 mg  600 mg Oral BID Hampton Abbot, MD   600 mg at 02/11/20 0914  . glipiZIDE (GLUCOTROL XL) 24 hr tablet 10 mg  10 mg Oral Q breakfast Lindon Romp A, NP   10 mg at 02/11/20 0912  . hydrALAZINE (APRESOLINE) tablet 25 mg  25 mg Oral BID Lindon Romp A, NP   25 mg at 02/11/20 0916  . losartan (COZAAR) tablet 100 mg  100 mg Oral Daily Hampton Abbot, MD   100 mg at 02/11/20 0914   And  . hydrochlorothiazide (HYDRODIURIL) tablet 25 mg  25 mg Oral Daily Hampton Abbot, MD   25 mg at 02/11/20 0914  . hydrOXYzine (ATARAX/VISTARIL) tablet 25 mg  25 mg Oral TID PRN Rozetta Nunnery, NP      . magnesium hydroxide (MILK OF MAGNESIA) suspension 30 mL  30 mL Oral Daily PRN Lindon Romp A, NP      . metFORMIN (GLUCOPHAGE) tablet 1,000 mg  1,000 mg Oral BID WC Lindon Romp A, NP   1,000 mg at 02/11/20 0914  . pioglitazone (ACTOS) tablet 30 mg  30 mg Oral Daily Lindon Romp A, NP   30 mg at 02/11/20 0913  .  predniSONE (DELTASONE) tablet 7.5 mg  7.5 mg Oral Q breakfast Lindon Romp A, NP   7.5 mg at 02/11/20 0915  . traZODone (DESYREL) tablet 50 mg  50 mg Oral QHS PRN Lindon Romp A, NP   50 mg at 02/10/20 2324   PTA Medications: Medications Prior to Admission  Medication Sig Dispense Refill Last Dose  . amLODipine (NORVASC) 5 MG tablet Take 1 tablet (5 mg total) by mouth daily. 30 tablet 4 Unknown at Unknown time  . atorvastatin (LIPITOR) 40 MG tablet Take 1 tablet (40 mg total) by mouth daily. Must have OV for refills 30 tablet 4 Unknown at Unknown time  . gabapentin (NEURONTIN) 600 MG tablet 1 pill twice per day and 2 at bedtime 120 tablet 4 Unknown at Unknown time  . glipiZIDE (GLUCOTROL XL) 10 MG 24 hr tablet Take 1 tablet (10 mg total) by mouth daily with breakfast. To lower blood sugar 30 tablet 4 Unknown at Unknown time  . hydrALAZINE (APRESOLINE) 25 MG tablet Take 1 tablet by mouth 3 (three) times daily.     Marland Kitchen losartan-hydrochlorothiazide (HYZAAR) 100-25 MG tablet Take 1 tablet by mouth daily. To lower blood pressure 30 tablet 4 Unknown at Unknown  time  . metFORMIN (GLUCOPHAGE) 1000 MG tablet Take 1 tablet (1,000 mg total) by mouth 2 (two) times daily with a meal. For diabetes management 60 tablet 4 Unknown at Unknown time  . pioglitazone (ACTOS) 30 MG tablet Take 1 tablet (30 mg total) by mouth daily. 30 tablet 4 Unknown at Unknown time  . predniSONE (DELTASONE) 5 MG tablet Take 1.5 tablets (7.5 mg total) by mouth daily with breakfast. 45 tablet 2 Unknown at Unknown time  . terbinafine (LAMISIL AT) 1 % cream Apply 1 application topically 2 (two) times daily. 42 g 0 Unknown at Unknown time    Patient Stressors:    Patient Strengths:    Treatment Modalities: Medication Management, Group therapy, Case management,  1 to 1 session with clinician, Psychoeducation, Recreational therapy.   Physician Treatment Plan for Primary Diagnosis: Schizoaffective disorder, depressive type ([place]) Long  Term Goal(s):     Short Term Goals:    Medication Management: Evaluate patient's response, side effects, and tolerance of medication regimen.  Therapeutic Interventions: 1 to 1 sessions, Unit Group sessions and Medication administration.  Evaluation of Outcomes: Not Met  Physician Treatment Plan for Secondary Diagnosis: Principal Problem:   Schizoaffective disorder, depressive type ([place]) Active Problems:   Schizoaffective disorder ([place])  Long Term Goal(s):     Short Term Goals:       Medication Management: Evaluate patient's response, side effects, and tolerance of medication regimen.  Therapeutic Interventions: 1 to 1 sessions, Unit Group sessions and Medication administration.  Evaluation of Outcomes: Not Met   RN Treatment Plan for Primary Diagnosis: Schizoaffective disorder, depressive type ([place]) Long Term Goal(s): Knowledge of disease and therapeutic regimen to maintain health will improve  Short Term Goals: Ability to identify and develop effective coping behaviors will improve and Compliance with prescribed medications will improve  Medication Management: RN will administer medications as ordered by provider, will assess and evaluate patient's response and provide education to patient for prescribed medication. RN will report any adverse and/or side effects to prescribing provider.  Therapeutic Interventions: 1 on 1 counseling sessions, Psychoeducation, Medication administration, Evaluate responses to treatment, Monitor vital signs and CBGs as ordered, Perform/monitor CIWA, COWS, AIMS and Fall Risk screenings as ordered, Perform wound care treatments as ordered.  Evaluation of Outcomes: Not Met   LCSW Treatment Plan for Primary Diagnosis: Schizoaffective disorder, depressive type ([place]) Long Term Goal(s): Safe transition to appropriate next level of care at discharge, Engage patient in therapeutic group addressing interpersonal concerns.  Short Term Goals: Engage  patient in aftercare planning with referrals and resources, Increase social support, Increase emotional regulation, Identify triggers associated with mental health/substance abuse issues and Increase skills for wellness and recovery  Therapeutic Interventions: Assess for all discharge needs, 1 to 1 time with Social worker, Explore available resources and support systems, Assess for adequacy in community support network, Educate family and significant other(s) on suicide prevention, Complete Psychosocial Assessment, Interpersonal group therapy.  Evaluation of Outcomes: Not Met  Progress in Treatment: Attending groups: No. New to unit. Participating in groups: No. Taking medication as prescribed: Yes. Toleration medication: Yes. Family/Significant other contact made: No, will contact:  supports if consents are granted. Patient understands diagnosis: Yes. Discussing patient identified problems/goals with staff: Yes. Medical problems stabilized or resolved: Yes. Denies suicidal/homicidal ideation: No. Endorses SI and HI Issues/concerns per patient self-inventory: Yes.  New problem(s) identified: No, Describe:  none  New Short Term/Long Term Goal(s): medication management for mood stabilization; elimination of SI thoughts; development of comprehensive  mental wellness/sobriety plan.  Patient Goals: "Get my meds back. I haven't had my injection in months."  Discharge Plan or Barriers: Needs to be referred for outpatient medication management through [facility]  Reason for Continuation of Hospitalization: Anxiety Delusions  Medication stabilization  Estimated Length of Stay: 2-4 days  Attendees: Patient: Duane Salazar 02/11/2020 11:41 AM  Physician: Dr. Mallie Darting 02/11/2020 11:41 AM  Nursing:  02/11/2020 11:41 AM  RN Care Manager: 02/11/2020 11:41 AM  Social Worker: Stephanie Acre, [place] 02/11/2020 11:41 AM  Recreational Therapist:  02/11/2020 11:41 AM  Other:  02/11/2020 11:41 AM  Other:  02/11/2020  11:41 AM  Other: 02/11/2020 11:41 AM    Scribe for Treatment Team: Joellen Jersey, [place] 02/11/2020 11:41 AM

## 2020-02-12 LAB — GLUCOSE, CAPILLARY: Glucose-Capillary: 78 mg/dL (ref 70–99)

## 2020-02-12 MED ORDER — HALOPERIDOL 5 MG PO TABS: 5.0000 mg | ORAL_TABLET | Freq: Two times a day (BID) | ORAL | 0 refills | Status: DC

## 2020-02-12 MED ORDER — GABAPENTIN 600 MG PO TABS: ORAL_TABLET | ORAL | 0 refills | Status: DC

## 2020-02-12 MED ORDER — METFORMIN HCL 1000 MG PO TABS: 1000.0000 mg | ORAL_TABLET | Freq: Two times a day (BID) | ORAL | 0 refills | Status: DC

## 2020-02-12 MED ORDER — PIOGLITAZONE HCL 30 MG PO TABS: 30.0000 mg | ORAL_TABLET | Freq: Every day | ORAL | 0 refills | Status: DC

## 2020-02-12 MED ORDER — AMLODIPINE BESYLATE 5 MG PO TABS: 5.0000 mg | ORAL_TABLET | Freq: Every day | ORAL | 0 refills | Status: DC

## 2020-02-12 MED ORDER — GLIPIZIDE ER 10 MG PO TB24: 10.0000 mg | ORAL_TABLET | Freq: Every day | ORAL | 0 refills | Status: DC

## 2020-02-12 MED ORDER — ATORVASTATIN CALCIUM 40 MG PO TABS: 40.0000 mg | ORAL_TABLET | Freq: Every day | ORAL | 0 refills | Status: DC

## 2020-02-12 MED ORDER — PREDNISONE 5 MG PO TABS: 7.5000 mg | ORAL_TABLET | Freq: Every day | ORAL | 0 refills | Status: DC

## 2020-02-12 MED ORDER — GABAPENTIN 600 MG PO TABS
600.0000 mg | ORAL_TABLET | Freq: Two times a day (BID) | ORAL | Status: DC
  Filled 2020-02-12 (×3): qty 42

## 2020-02-12 MED ORDER — LOSARTAN POTASSIUM-HCTZ 100-25 MG PO TABS: 1.0000 | ORAL_TABLET | Freq: Every day | ORAL | 0 refills | Status: DC

## 2020-02-12 MED ORDER — HYDRALAZINE HCL 25 MG PO TABS: ORAL_TABLET | ORAL | 0 refills | Status: DC

## 2020-02-12 MED FILL — LOSARTAN-HCTZ 100-25 MG TAB: 100-25 | 30 days supply | Qty: 30 | Fill #0

## 2020-02-12 MED FILL — ?HALOPERIDOL 5 MG TABLET: 5 | 30 days supply | Qty: 60 | Fill #0

## 2020-02-12 MED FILL — GABAPENTIN 600 MG TABLET: 600 | 30 days supply | Qty: 120 | Fill #0

## 2020-02-12 MED FILL — PIOGLITAZONE HCL 30 MG TAB: 30 | 30 days supply | Qty: 30 | Fill #0

## 2020-02-12 MED FILL — hydrALAZINE HCL 25 MG TABS: 25 | 30 days supply | Qty: 60 | Fill #0

## 2020-02-12 NOTE — BHH Suicide Risk Assessment (Signed)
[place] INPATIENT:  Family/Significant Other Suicide Prevention Education  Suicide Prevention Education:  Education Completed;  Mother, Tavita Eastham [phone]) has been identified by the patient as the family member/significant other with whom the patient will be residing, and identified as the person(s) who will aid the patient in the event of a mental health crisis (suicidal ideations/suicide attempt).  With written consent from the patient, the family member/significant other has been provided the following suicide prevention education, prior to the and/or following the discharge of the patient.  The suicide prevention education provided includes the following:  Suicide risk factors  Suicide prevention and interventions  National Suicide Hotline telephone number  [facility] assessment telephone number  [facility] Emergency Assistance [place] and/or Residential Mobile Crisis Unit telephone number  Request made of family/significant other to:  Remove weapons (e.g., guns, rifles, knives), all items previously/currently identified as safety concern.    Remove drugs/medications (over-the-counter, prescriptions, illicit drugs), all items previously/currently identified as a safety concern.  The family member/significant other verbalizes understanding of the suicide prevention education information provided.  The family member/significant other agrees to remove the items of safety concern listed above.  Mother's main concern was medication compliance and patient receiving a long acting injectable. Mother is agreeable to pick up patient this morning for discharge.  Joellen Jersey 02/12/2020, 9:18 AM

## 2020-02-12 NOTE — Progress Notes (Signed)
  [facility] Adult Case Management Discharge Plan :  Will you be returning to the same living situation after discharge:  Yes,  will return to live with mother. At discharge, do you have transportation home?: Yes,  mother to pick pt up. Do you have the ability to pay for your medications: No. Samples will be provided at discharge.  Release of information consent forms completed and in the chart;  Patient's signature needed at discharge.  Patient to Follow up at:  Follow-up Information    [place] Go on 02/25/2020.   Specialty: Professional Counselor Why: You are scheduled for therapy on 02/25/20 at 2:00 pm.  This appointment will be held in person.  Contact information: [place] [phone] [place]. Go on 03/13/2020.   Specialty: Behavioral Health Why: You have an appointment on 03/13/20 at 9:00 am with Dr. Toy Care for medication management.  This appointment will be held in person.  Please arrive at 8:30 am to complete paperwork. Contact information: [place] [phone]              Next level of care provider has access to [place] and Suicide Prevention discussed: Yes,  with mother.     Has patient been referred to the Quitline?: Patient refused referral  Patient has been referred for addiction treatment: Pt. refused referral  Vassie Moselle, [place] 02/12/2020, 9:20 AM

## 2020-02-12 NOTE — BHH Suicide Risk Assessment (Signed)
[facility] Discharge Suicide Risk Assessment   Principal Problem: Schizoaffective disorder, depressive type [facility]) Discharge Diagnoses: Principal Problem:   Schizoaffective disorder, depressive type ([place]) Active Problems:   Schizoaffective disorder ([place])   Total Time spent with patient: 20 minutes  Musculoskeletal: Strength & Muscle Tone: within normal limits Gait & Station: normal Patient leans: N/A  Psychiatric Specialty Exam: Review of Systems  All other systems reviewed and are negative.   Blood pressure (!) 126/98, pulse 92, temperature 97.7 F (36.5 C), temperature source Oral, resp. rate 16, height 6\' 4"  (1.93 m), weight 135.2 kg, SpO2 100 %.Body mass index is 36.27 kg/m.  General Appearance: Casual  Eye Contact::  Fair  Speech:  Normal Rate409  Volume:  Normal  Mood:  Euthymic  Affect:  Congruent  Thought Process:  Coherent and Descriptions of Associations: Intact  Orientation:  Full (Time, Place, and Person)  Thought Content:  Logical  Suicidal Thoughts:  No  Homicidal Thoughts:  No  Memory:  Immediate;   Fair Recent;   Fair Remote;   Fair  Judgement:  Intact  Insight:  Fair  Psychomotor Activity:  Normal  Concentration:  Fair  Recall:  [employer] of Knowledge:Fair  Language: Good  Akathisia:  Negative  Handed:  Right  AIMS (if indicated):     Assets:  Desire for Improvement Resilience  Sleep:  Number of Hours: 9  Cognition: WNL  ADL's:  Intact   Mental Status Per Nursing Assessment::   On Admission:  Suicidal ideation indicated by patient, Thoughts of violence towards others  Demographic Factors:  Male, Low socioeconomic status and Unemployed  Loss Factors: Financial problems/change in socioeconomic status  Historical Factors: Impulsivity  Risk Reduction Factors:   Sense of responsibility to family and Positive social support  Continued Clinical Symptoms:  Schizophrenia:   Paranoid or undifferentiated type  Cognitive Features That Contribute  To Risk:  None    Suicide Risk:  Minimal: No identifiable suicidal ideation.  Patients presenting with no risk factors but with morbid ruminations; may be classified as minimal risk based on the severity of the depressive symptoms   Follow-up [place] Go on 02/25/2020.   Specialty: Professional Counselor Why: You are scheduled for therapy on 02/25/20 at 2:00 pm.  This appointment will be held in person.  Contact information: [place] [phone] [place] Follow up.   Specialty: Behavioral Health Why: medication management Contact information: [place] [phone]              Plan Of Care/Follow-up recommendations:  Activity:  ad lib  Sharma Covert, MD 02/12/2020, 8:13 AM

## 2020-02-12 NOTE — Progress Notes (Signed)
Pt discharged to lobby. Pt was stable and appreciative at that time. All papers, samples and prescriptions were given and valuables returned. Verbal understanding expressed. Denies SI/HI and A/VH. Pt given opportunity to express concerns and ask questions.  

## 2020-02-12 NOTE — Discharge Summary (Signed)
Physician Discharge Summary Note  Patient:  Duane Salazar is an 44 y.o., male MRN:  [id number] DOB:  11/13/1975 Patient phone:  [phone] (home)  Patient address:   [place] [address],  Total Time spent with patient: 15 minutes  Date of Admission:  02/10/2020 Date of Discharge: 02/12/20  Reason for Admission:  SI/HI  Principal Problem: Schizoaffective disorder, depressive type [facility]) Discharge Diagnoses: Principal Problem:   Schizoaffective disorder, depressive type ([place]) Active Problems:   Schizoaffective disorder ([place])   Past Psychiatric History: The patient most recently was treated for his illness while incarcerated.  He was released in January 2021.  He last received his long-acting Haldol injection while he was incarcerated.  Review of the electronic medical record revealed his last psychiatric hospitalization at our facility was on 09/18/2015.  He was diagnosed with schizoaffective disorder at that time.  His discharge medications at that time included gabapentin, Haldol, Haldol Decanoate, Lamictal, sertraline, trazodone.  Past Medical History:  Past Medical History:  Diagnosis Date  . 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 AKI (acute kidney injury) ([place]) 03/21/2015  . Arthritis Dx 2002  . Diabetes type 2, controlled ([place]) 2010  . Headache   . Hypertension Dx 2002  . 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 Schizo affective schizophrenia ([place]) 1995  . Schizophrenia ([place])   . Seizures ([place])   . Sleep apnea 1995   on CPAP    Past Surgical History:  Procedure Laterality Date  . PITUITARY SURGERY  2005 & 2012  . SKIN BIOPSY     Family History:  Family History  Problem Relation Age of Onset  . Hypertension Mother   . Diabetes Mother   . Heart Problems Mother   . Cancer Maternal Uncle   . Alcoholism Maternal Uncle   . Cancer Maternal Grandmother   . Heart disease Neg Hx    Family Psychiatric  History: His uncle has alcoholism. Social History:  Social History    Substance and Sexual Activity  Alcohol Use Yes  . Alcohol/week: 2.0 standard drinks  . Types: 2 Cans of beer per week   Comment: 2 40s a day     Social History   Substance and Sexual Activity  Drug Use Yes  . Types: Cocaine, Marijuana, Heroin   Comment: Reports used 3 weeks ago. 10/17/2015    Social History   Socioeconomic History  . Marital status: Married    Spouse name: Not on file  . Number of children: 2  . Years of education: GED  . Highest education level: Not on file  Occupational History  . Not on file  Tobacco Use  . Smoking status: Current Every Day Smoker    Packs/day: 1.00    Years: 20.00    Pack years: 20.00    Types: Cigarettes  . Smokeless tobacco: Never Used  . Tobacco comment: Smoking .5 ppd  Substance and Sexual Activity  . Alcohol use: Yes    Alcohol/week: 2.0 standard drinks    Types: 2 Cans of beer per week    Comment: 2 40s a day  . Drug use: Yes    Types: Cocaine, Marijuana, Heroin    Comment: Reports used 3 weeks ago. 10/17/2015  . Sexual activity: Yes  Other Topics Concern  . Not on file  Social History Narrative   Lives with mom.   Incarcerated for 22 months in [place], [place]. From 2014-09/2014   Social Determinants of Health   Financial Resource Strain:   . Difficulty  of Paying Living Expenses:   Food Insecurity:   . Worried About Charity fundraiser in the Last Year:   . Arboriculturist in the Last Year:   Transportation Needs:   . Film/video editor (Medical):   Marland Kitchen Lack of Transportation (Non-Medical):   Physical Activity:   . Days of Exercise per Week:   . Minutes of Exercise per Session:   Stress:   . Feeling of Stress :   Social Connections:   . Frequency of Communication with Friends and Family:   . Frequency of Social Gatherings with Friends and Family:   . Attends Religious Services:   . Active Member of Clubs or Organizations:   . Attends Archivist Meetings:   Marland Kitchen Marital Status:     Hospital  Course:  From admission H&P: Patient is a 44 year old male who was brought from the [facility] residential treatment program with a history of schizophrenia. The patient stated he usually receives a monthly Haldol injection. He stated the last time he received the injection was while he was incarcerated. He apparently had been incarcerated several times totaling 28 years. He had been most recently incarcerated for 45 months for robbery and assault. He was released in January 2021. He told the folks at [facility] that he "felt like an episode was coming on". He stated these episodes come on and he wants to hurt himself and others. He started thinking about hanging himself with a blanket at the residential program. He was brought to the hospital for evaluation. The decision was made to admit him to the hospital for evaluation and stabilization. His last psychiatric hospitalization at our facility was in 2017. His diagnosis at that time included schizoaffective disorder; depressive type. His psychiatric medications at the time of discharge included Cogentin, clonidine, gabapentin, Haldol and the Haldol decanoate injection. 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 He remains on prednisone for that. He was admitted to the hospital for evaluation and stabilization.  Duane Salazar was admitted for reports of SI/HI after noncompliance with his Haldol Decanoate injection. He remained on the [facility] unit for two days. He was started on Haldol and received Haldol Decanoate 100 mg on 02/11/20. He participated in group therapy on the unit. He responded well to treatment with no adverse effects reported. He has shown improved mood, affect, sleep, and interaction. He denies any SI/HI/AVH and contracts for safety. He is discharging on the medications listed below. He agrees to follow up at [facility] and [facility] (see below). Patient is provided with prescriptions and medication samples upon discharge. His mother is picking him up for discharge home.  Physical Findings: AIMS: Facial and Oral Movements Muscles of Facial Expression: None, normal Lips and Perioral Area: None, normal Jaw: None, normal Tongue: None, normal,Extremity Movements Upper (arms, wrists, hands, fingers): None, normal Lower (legs, knees, ankles, toes): None, normal, Trunk Movements Neck, shoulders, hips: None, normal, Overall Severity Severity of abnormal movements (highest score from questions above): None, normal Incapacitation due to abnormal movements: None, normal Patient's awareness of abnormal movements (rate only patient's report): No Awareness, Dental Status Current problems with teeth and/or dentures?: No Does patient usually wear dentures?: No  CIWA:  CIWA-Ar Total: 0 COWS:  COWS Total Score: 2  Musculoskeletal: Strength & Muscle Tone: within normal limits Gait & Station: normal Patient leans: N/A  Psychiatric Specialty Exam: Physical Exam  Nursing note and vitals reviewed. Constitutional: He is oriented to person, place, and time. He appears well-developed.  Cardiovascular: Normal rate.  Respiratory: Effort normal.  Neurological: He is alert and oriented to person, place, and time.    Review of Systems  Constitutional: Negative.   Respiratory: Negative for cough and shortness of breath.   Psychiatric/Behavioral: Negative for agitation, behavioral problems, confusion, dysphoric mood, hallucinations, self-injury, sleep disturbance and suicidal ideas. The patient is not nervous/anxious and is not hyperactive.     Blood pressure (!) 126/98, pulse 92, temperature 97.7 F (36.5 C), temperature source Oral, resp. rate 16, height 6\' 4"  (1.93 m), weight 135.2 kg, SpO2 100 %.Body mass index is 36.27 kg/m.  See MD's discharge SRA      Has this patient used any form of tobacco in the last 30  days? (Cigarettes, Smokeless Tobacco, Cigars, and/or Pipes)  No  Blood Alcohol level:  Lab Results  Component Value Date   ETH <5 09/15/2015   ETH 75 (H) 32/09/3341    Metabolic Disorder Labs:  Lab Results  Component Value Date   HGBA1C 7.8 (A) 01/31/2020   No results found for: PROLACTIN Lab Results  Component Value Date   CHOL 115 02/11/2020   TRIG 85 02/11/2020   HDL 47 02/11/2020   CHOLHDL 2.4 02/11/2020   VLDL 17 02/11/2020   LDLCALC 51 02/11/2020   LDLCALC 96 10/21/2014    See Psychiatric Specialty Exam and Suicide Risk Assessment completed by Attending Physician prior to discharge.  Discharge destination:  Home  Is patient on multiple antipsychotic therapies at discharge:  No   Has Patient had three or more failed trials of antipsychotic monotherapy by history:  No  Recommended Plan for Multiple Antipsychotic Therapies: NA  Discharge Instructions    Discharge instructions   Complete by: As directed    Patient is instructed to take all prescribed medications as recommended. Report any side effects or adverse reactions to your outpatient psychiatrist. Patient is instructed to abstain from alcohol and illegal drugs while on prescription medications. In the event of worsening symptoms, patient is instructed to call the crisis hotline, 911, or go to the nearest emergency department for evaluation and treatment.     Allergies as of 02/12/2020   No Known Allergies     Medication List    STOP taking these medications   terbinafine 1 % cream Commonly known as: LamISIL AT     TAKE these medications     Indication  amLODipine 5 MG tablet Commonly known as: NORVASC Take 1 tablet (5 mg total) by mouth daily.  Indication: High Blood Pressure Disorder   atorvastatin 40 MG tablet Commonly known as: LIPITOR Take 1 tablet (40 mg total) by mouth daily. Must have OV for refills  Indication: High Amount of Fats in the Blood   gabapentin 600 MG tablet Commonly  known as: Neurontin 1 pill twice per day and 2 at bedtime  Indication: Neuropathic Pain   glipiZIDE 10 MG 24 hr tablet Commonly known as: GLUCOTROL XL Take 1 tablet (10 mg total) by mouth daily with breakfast. To lower blood sugar  Indication: Type 2 Diabetes   haloperidol 5 MG tablet Commonly known as: HALDOL Take 1 tablet (5 mg total) by mouth 2 (two) times daily.  Indication: Psychosis   hydrALAZINE 25 MG tablet Commonly known as: APRESOLINE 1 in the morning and 1 with lunch What changed: Another medication with the same name was removed. Continue taking this medication, and follow  the directions you see here.  Indication: High Blood Pressure Disorder   losartan-hydrochlorothiazide 100-25 MG tablet Commonly known as: HYZAAR Take 1 tablet by mouth daily. To lower blood pressure  Indication: High Blood Pressure Disorder   metFORMIN 1000 MG tablet Commonly known as: GLUCOPHAGE Take 1 tablet (1,000 mg total) by mouth 2 (two) times daily with a meal. For diabetes management  Indication: Type 2 Diabetes   pioglitazone 30 MG tablet Commonly known as: ACTOS Take 1 tablet (30 mg total) by mouth daily.  Indication: Type 2 Diabetes   predniSONE 5 MG tablet Commonly known as: DELTASONE Take 1.5 tablets (7.5 mg total) by mouth daily with breakfast.  Indication: pituitary insufficiency       Follow-up Information    [facility] Go on 02/25/2020.   Specialty: Professional Counselor Why: You are scheduled for therapy on 02/25/20 at 2:00 pm.  This appointment will be held in person.  Contact information: [place] [phone] [place]. Go on 03/13/2020.   Specialty: Behavioral Health Why: You have an appointment on 03/13/20 at 9:00 am with Dr. Toy Care for medication management.  This appointment will be held in person.  Please arrive at 8:30 am to complete paperwork. Contact  information: [place] [phone]              Follow-up recommendations: Activity as tolerated. Diet as recommended by primary care physician. Keep all scheduled follow-up appointments as recommended.   Comments:   Patient is instructed to take all prescribed medications as recommended. Report any side effects or adverse reactions to your outpatient psychiatrist. Patient is instructed to abstain from alcohol and illegal drugs while on prescription medications. In the event of worsening symptoms, patient is instructed to call the crisis hotline, 911, or go to the nearest emergency department for evaluation and treatment.  Signed: Connye Burkitt, NP 02/12/2020, 10:03 AM

## 2020-02-14 MED FILL — ?ATORVASTATIN 40MG TABLET: 40 | 30 days supply | Qty: 30 | Fill #0

## 2020-02-14 MED FILL — metFORMIN HCL 1000 MG TABS: 1000 | 30 days supply | Qty: 60 | Fill #0

## 2020-02-14 MED FILL — glipiZIDE XL 10 MG TB24: 10 | 30 days supply | Qty: 30 | Fill #0

## 2020-02-14 MED FILL — ?AMLODIPINE BESYLATE 5MG TA: 5 | 30 days supply | Qty: 30 | Fill #0

## 2020-02-14 MED FILL — ?PREDNISONE 5 MG TABLET: 5 MG | 30 days supply | Qty: 45 | Fill #0

## 2020-02-20 ENCOUNTER — Other Ambulatory Visit: Payer: Self-pay

## 2020-02-20 ENCOUNTER — Encounter (HOSPITAL_COMMUNITY): Payer: Self-pay | Admitting: Emergency Medicine

## 2020-02-20 ENCOUNTER — Ambulatory Visit (HOSPITAL_COMMUNITY)
Admission: EM | Admit: 2020-02-20 | Discharge: 2020-02-21 | Disposition: A | Discharge status: Home / Self Care | Payer: No Payment, Other | Attending: Registered Nurse | Admitting: Registered Nurse

## 2020-02-20 ENCOUNTER — Emergency Department (HOSPITAL_COMMUNITY)
Admission: EM | Admit: 2020-02-20 | Discharge: 2020-02-20 | Disposition: A | Discharge status: Home / Self Care | Payer: Self-pay | Attending: Emergency Medicine | Admitting: Emergency Medicine

## 2020-02-20 ENCOUNTER — Encounter (HOSPITAL_COMMUNITY): Payer: Self-pay

## 2020-02-20 DIAGNOSIS — T50905A Adverse effect of unspecified drugs, medicaments and biological substances, initial encounter: Secondary | ICD-10-CM | POA: Insufficient documentation

## 2020-02-20 DIAGNOSIS — E119 Type 2 diabetes mellitus without complications: Secondary | ICD-10-CM | POA: Diagnosis not present

## 2020-02-20 DIAGNOSIS — Z86018 Personal history of other benign neoplasm: Secondary | ICD-10-CM | POA: Insufficient documentation

## 2020-02-20 DIAGNOSIS — F251 Schizoaffective disorder, depressive type: Secondary | ICD-10-CM | POA: Diagnosis not present

## 2020-02-20 DIAGNOSIS — G4730 Sleep apnea, unspecified: Secondary | ICD-10-CM | POA: Insufficient documentation

## 2020-02-20 DIAGNOSIS — Z833 Family history of diabetes mellitus: Secondary | ICD-10-CM | POA: Insufficient documentation

## 2020-02-20 DIAGNOSIS — Z79899 Other long term (current) drug therapy: Secondary | ICD-10-CM | POA: Insufficient documentation

## 2020-02-20 DIAGNOSIS — Z7984 Long term (current) use of oral hypoglycemic drugs: Secondary | ICD-10-CM | POA: Insufficient documentation

## 2020-02-20 DIAGNOSIS — I10 Essential (primary) hypertension: Secondary | ICD-10-CM | POA: Insufficient documentation

## 2020-02-20 DIAGNOSIS — Z8249 Family history of ischemic heart disease and other diseases of the circulatory system: Secondary | ICD-10-CM | POA: Insufficient documentation

## 2020-02-20 DIAGNOSIS — F17210 Nicotine dependence, cigarettes, uncomplicated: Secondary | ICD-10-CM | POA: Insufficient documentation

## 2020-02-20 DIAGNOSIS — R1310 Dysphagia, unspecified: Secondary | ICD-10-CM | POA: Insufficient documentation

## 2020-02-20 DIAGNOSIS — Z20822 Contact with and (suspected) exposure to covid-19: Secondary | ICD-10-CM | POA: Insufficient documentation

## 2020-02-20 DIAGNOSIS — Z7952 Long term (current) use of systemic steroids: Secondary | ICD-10-CM | POA: Insufficient documentation

## 2020-02-20 LAB — COMPREHENSIVE METABOLIC PANEL
ALT: 29 U/L (ref 0–44)
AST: 23 U/L (ref 15–41)
Albumin: 4 g/dL (ref 3.5–5.0)
Alkaline Phosphatase: 53 U/L (ref 38–126)
Anion gap: 9 (ref 5–15)
BUN: 11 mg/dL (ref 6–20)
CO2: 24 mmol/L (ref 22–32)
Calcium: 9.3 mg/dL (ref 8.9–10.3)
Chloride: 105 mmol/L (ref 98–111)
Creatinine, Ser: 0.83 mg/dL (ref 0.61–1.24)
GFR calc Af Amer: >60 mL/min (ref >60)
GFR calc non Af Amer: >60 mL/min (ref >60)
Glucose, Bld: 108 mg/dL — ABNORMAL HIGH (ref 70–99)
Potassium: 4.2 mmol/L (ref 3.5–5.1)
Sodium: 138 mmol/L (ref 135–145)
Total Bilirubin: 0.8 mg/dL (ref 0.3–1.2)
Total Protein: 7 g/dL (ref 6.5–8.1)

## 2020-02-20 LAB — POCT URINE DRUG SCREEN - MANUAL ENTRY (I-SCREEN)
POC Amphetamine UR: NOT DETECTED
POC Buprenorphine (BUP): NOT DETECTED
POC Cocaine UR: NOT DETECTED
POC Marijuana UR: POSITIVE — AB
POC Methadone UR: NOT DETECTED
POC Methamphetamine UR: NOT DETECTED
POC Morphine: NOT DETECTED
POC Oxazepam (BZO): NOT DETECTED
POC Oxycodone UR: NOT DETECTED
POC Secobarbital (BAR): NOT DETECTED

## 2020-02-20 LAB — GLUCOSE, CAPILLARY: Glucose-Capillary: 135 mg/dL — ABNORMAL HIGH (ref 70–99)

## 2020-02-20 LAB — CBC WITH DIFFERENTIAL/PLATELET
Abs Immature Granulocytes: 0.02 10*3/uL (ref 0.00–0.07)
Basophils Absolute: 0 10*3/uL (ref 0.0–0.1)
Basophils Relative: 1 %
Eosinophils Absolute: 0.4 10*3/uL (ref 0.0–0.5)
Eosinophils Relative: 6 %
HCT: 45.8 % (ref 39.0–52.0)
Hemoglobin: 14.2 g/dL (ref 13.0–17.0)
Immature Granulocytes: 0 %
Lymphocytes Relative: 38 %
Lymphs Abs: 2.8 10*3/uL (ref 0.7–4.0)
MCH: 23.3 pg — ABNORMAL LOW (ref 26.0–34.0)
MCHC: 31 g/dL (ref 30.0–36.0)
MCV: 75.1 fL — ABNORMAL LOW (ref 80.0–100.0)
Monocytes Absolute: 0.6 10*3/uL (ref 0.1–1.0)
Monocytes Relative: 8 %
Neutro Abs: 3.6 10*3/uL (ref 1.7–7.7)
Neutrophils Relative %: 47 %
Platelets: 204 10*3/uL (ref 150–400)
RBC: 6.1 MIL/uL — ABNORMAL HIGH (ref 4.22–5.81)
RDW: 15.3 % (ref 11.5–15.5)
WBC: 7.5 10*3/uL (ref 4.0–10.5)
nRBC: 0 % (ref 0.0–0.2)

## 2020-02-20 LAB — ETHANOL: Alcohol, Ethyl (B): <10 mg/dL (ref <10)

## 2020-02-20 MED ORDER — LOSARTAN POTASSIUM-HCTZ 100-25 MG PO TABS: 1.0000 | ORAL_TABLET | Freq: Every day | ORAL | Status: DC

## 2020-02-20 MED ORDER — BENZTROPINE MESYLATE 0.5 MG PO TABS
0.5000 mg | ORAL_TABLET | Freq: Two times a day (BID) | ORAL | Status: DC
  Administered 2020-02-20 – 2020-02-21 (×2): 0.5 mg via ORAL
  Filled 2020-02-20 (×2): qty 1

## 2020-02-20 MED ORDER — GABAPENTIN 600 MG PO TABS
600.0000 mg | ORAL_TABLET | Freq: Three times a day (TID) | ORAL | Status: DC
  Administered 2020-02-20 – 2020-02-21 (×2): 600 mg via ORAL
  Filled 2020-02-20 (×2): qty 1

## 2020-02-20 MED ORDER — PIOGLITAZONE HCL 30 MG PO TABS
30.0000 mg | ORAL_TABLET | Freq: Every day | ORAL | Status: DC
  Filled 2020-02-20: qty 1

## 2020-02-20 MED ORDER — ATORVASTATIN CALCIUM 40 MG PO TABS
40.0000 mg | ORAL_TABLET | Freq: Every day | ORAL | Status: DC
  Administered 2020-02-20 – 2020-02-21 (×2): 40 mg via ORAL
  Filled 2020-02-20 (×2): qty 1

## 2020-02-20 MED ORDER — GLIPIZIDE ER 2.5 MG PO TB24
10.0000 mg | ORAL_TABLET | Freq: Every day | ORAL | Status: DC
  Administered 2020-02-21: 10 mg via ORAL
  Filled 2020-02-20: qty 4

## 2020-02-20 MED ORDER — LOSARTAN POTASSIUM 50 MG PO TABS
100.0000 mg | ORAL_TABLET | Freq: Every day | ORAL | Status: DC
  Administered 2020-02-21: 100 mg via ORAL
  Filled 2020-02-20: qty 2

## 2020-02-20 MED ORDER — PREDNISONE 5 MG PO TABS
7.5000 mg | ORAL_TABLET | Freq: Every day | ORAL | Status: DC
  Administered 2020-02-21: 7.5 mg via ORAL
  Filled 2020-02-20: qty 2

## 2020-02-20 MED ORDER — HYDROCHLOROTHIAZIDE 25 MG PO TABS
25.0000 mg | ORAL_TABLET | Freq: Every day | ORAL | Status: DC
  Administered 2020-02-21: 25 mg via ORAL
  Filled 2020-02-20: qty 1

## 2020-02-20 MED ORDER — AMLODIPINE BESYLATE 5 MG PO TABS
5.0000 mg | ORAL_TABLET | Freq: Every day | ORAL | Status: DC
  Administered 2020-02-20 – 2020-02-21 (×2): 5 mg via ORAL
  Filled 2020-02-20 (×2): qty 1

## 2020-02-20 MED ORDER — HYDRALAZINE HCL 25 MG PO TABS
25.0000 mg | ORAL_TABLET | Freq: Two times a day (BID) | ORAL | Status: DC
  Administered 2020-02-21: 25 mg via ORAL
  Filled 2020-02-20: qty 1

## 2020-02-20 MED ORDER — INSULIN ASPART 100 UNIT/ML ~~LOC~~ SOLN: 0.0000 [IU] | Freq: Three times a day (TID) | SUBCUTANEOUS | Status: DC

## 2020-02-20 MED ORDER — BENZTROPINE MESYLATE 1 MG/ML IJ SOLN
2.0000 mg | Freq: Once | INTRAMUSCULAR | Status: AC
  Administered 2020-02-20: 2 mg via INTRAMUSCULAR
  Filled 2020-02-20: qty 2

## 2020-02-20 MED ORDER — HALOPERIDOL 5 MG PO TABS
5.0000 mg | ORAL_TABLET | Freq: Two times a day (BID) | ORAL | Status: DC
  Administered 2020-02-20 – 2020-02-21 (×2): 5 mg via ORAL
  Filled 2020-02-20 (×2): qty 1

## 2020-02-20 MED ORDER — METFORMIN HCL 500 MG PO TABS
1000.0000 mg | ORAL_TABLET | Freq: Two times a day (BID) | ORAL | Status: DC
  Administered 2020-02-21: 1000 mg via ORAL
  Filled 2020-02-20: qty 2

## 2020-02-20 NOTE — ED Provider Notes (Signed)
Behavioral Health Admission [facility]&P [facility] & OBS)  Date: 02/20/20 Patient Name: Duane Salazar MRN: [id number] Chief Complaint: No chief complaint on file.     Diagnoses:  Final diagnoses:  Schizoaffective disorder, depressive type ([place])    HPI: Duane Salazar is a 44 y.o. male patient presented to [facility] with complaints of tightness of jaws "feel like they want to lock up.  I got a haldol injection and is suppose to take the haldol pil to."  Patient admits to recently being discharged from [facility] and is aware of his follow up appointments at [facility] 02/25/20 and [facility] [place] on 03/13/20.   During assessment patient has stated he was suicidal and hearing voices.  Then later after he found out he was going to stay admitted that he was not suicidal/homicidal and not having any hallucinations; patient asked why he lied and he stated "cause I wanted to make sure I was going to be able to stay and get my stuff to fix my jaw."   PHQ 2-9:    Office Visit from 11/23/2019 in [place] Office Visit from 10/17/2015 in [place]  Thoughts that you would be better off dead, or of hurting yourself in some way Not at all Several days  PHQ-9 Total Score 8 23        Admission (Discharged) from OP Visit from 02/10/2020 in [place] High Risk       Total Time spent with patient: 30 minutes  Musculoskeletal  Strength & Muscle Tone: within normal limits Gait & Station: normal Patient leans: N/A  Psychiatric Specialty Exam  Presentation General Appearance: Appropriate for Environment;Casual  Eye Contact:Good  Speech:Clear and Coherent;Normal Rate  Speech Volume:Normal  Handedness:Right   Mood and Affect  Mood:Anxious;Labile  Affect:Congruent   Thought Process  Thought Processes:Coherent;Goal Directed  Descriptions of Associations:Intact  Orientation:Full (Time,  Place and Person)  Thought Content:WDL  Hallucinations:Hallucinations: None  Ideas of Reference:None  Suicidal Thoughts:Suicidal Thoughts: No  Homicidal Thoughts:Homicidal Thoughts: No   Sensorium  Memory:Immediate Good;Recent Good;Remote Good  Judgment:Intact  Insight:Present   Executive Functions  Concentration:Good  Attention Span:Good  [place]  Language:Good   Psychomotor Activity  Psychomotor Activity:Psychomotor Activity: Extrapyramidal Side Effects (EPS) Extrapyramidal Side Effects (EPS): Dystonia (Complaints of stiffness of jaw) AIMS Completed?: Yes (Not available at this time)   Assets  Assets:Communication Skills;Desire for Improvement   Sleep  Sleep:Sleep: Fair   Physical Exam ROS  Blood pressure (!) 141/103, pulse 91, temperature (!) 97.5 F (36.4 C), temperature source Tympanic, resp. rate 18, height 6\' 3"  (1.905 m), weight 299 lb (135.6 kg), SpO2 98 %. Body mass index is 37.37 kg/m.  Past Psychiatric History: See above   Is the patient at risk to self? No  Has the patient been a risk to self in the past 6 months? Yes .    Has the patient been a risk to self within the distant past? Yes   Is the patient a risk to others? No   Has the patient been a risk to others in the past 6 months? No   Has the patient been a risk to others within the distant past? No   Past Medical History:  Past Medical History:  Diagnosis Date  . 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 AKI (acute kidney injury) ([place]) 03/21/2015  . Arthritis Dx 2002  . Diabetes type 2,  controlled ([place]) 2010  . Headache   . Hypertension Dx 2002  . 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 Schizo affective schizophrenia ([place]) 1995  . Schizophrenia ([place])   . Seizures ([place])   . Sleep apnea 1995   on CPAP    Past Surgical History:  Procedure Laterality Date  . PITUITARY SURGERY  2005 & 2012  . SKIN BIOPSY      Family History:  Family History  Problem Relation  Age of Onset  . Hypertension Mother   . Diabetes Mother   . Heart Problems Mother   . Cancer Maternal Uncle   . Alcoholism Maternal Uncle   . Cancer Maternal Grandmother   . Heart disease Neg Hx     Social History:  Social History   Socioeconomic History  . Marital status: Single    Spouse name: Not on file  . Number of children: 2  . Years of education: GED  . Highest education level: Not on file  Occupational History  . Not on file  Tobacco Use  . Smoking status: Current Every Day Smoker    Packs/day: 1.00    Years: 20.00    Pack years: 20.00    Types: Cigarettes  . Smokeless tobacco: Never Used  . Tobacco comment: Smoking .5 ppd  Substance and Sexual Activity  . Alcohol use: Yes    Alcohol/week: 2.0 standard drinks    Types: 2 Cans of beer per week    Comment: 2 40s a day  . Drug use: Yes    Types: Cocaine, Marijuana, Heroin    Comment: Reports used 3 weeks ago. 10/17/2015  . Sexual activity: Yes  Other Topics Concern  . Not on file  Social History Narrative   Lives with mom.   Incarcerated for 22 months in [place], [place]. From 2014-09/2014   Social Determinants of Health   Financial Resource Strain:   . Difficulty of Paying Living Expenses:   Food Insecurity:   . Worried About Charity fundraiser in the Last Year:   . Arboriculturist in the Last Year:   Transportation Needs:   . Film/video editor (Medical):   [place] Lack of Transportation (Non-Medical):   Physical Activity:   . Days of Exercise per Week:   . Minutes of Exercise per Session:   Stress:   . Feeling of Stress :   Social Connections:   . Frequency of Communication with Friends and Family:   . Frequency of Social Gatherings with Friends and Family:   . Attends Religious Services:   . Active Member of Clubs or Organizations:   . Attends Archivist Meetings:   Marland [place] Marital Status:   Intimate Partner Violence:   . Fear of Current or Ex-Partner:   . Emotionally Abused:   [place]  Physically Abused:   . Sexually Abused:     SDOH:  SDOH Screenings   Alcohol Screen: Low Risk   . Last Alcohol Screening Score (AUDIT): 3  Depression (PHQ2-9): Medium Risk  . PHQ-2 Score: 8  Financial Resource Strain:   . Difficulty of Paying Living Expenses:   Food Insecurity:   . Worried About Charity fundraiser in the Last Year:   . [place] in the Last Year:   Housing:   . Last Housing Risk Score:   Physical Activity:   . Days of Exercise per Week:   . Minutes of Exercise per Session:   Social Connections:   .  Frequency of Communication with Friends and Family:   . Frequency of Social Gatherings with Friends and Family:   . Attends Religious Services:   . Active Member of Clubs or Organizations:   . Attends Archivist Meetings:   Marland Kitchen Marital Status:   Stress:   . Feeling of Stress :   Tobacco Use: High Risk  . Smoking Tobacco Use: Current Every Day Smoker  . Smokeless Tobacco Use: Never Used  Transportation Needs:   . Film/video editor (Medical):   Mar[place] Kitchen Lack of Transportation (Non-Medical):     Last Labs:  Admission on 02/10/2020, Discharged on 02/12/2020  Component Date Value Ref Range Status  . SARS Coronavirus 2 02/10/2020 NEGATIVE  NEGATIVE Final   Comment: (NOTE) SARS-CoV-2 target nucleic acids are NOT DETECTED.  The SARS-CoV-2 RNA is generally detectable in upper and lower respiratory specimens during the acute phase of infection. The lowest concentration of SARS-CoV-2 viral copies this assay can detect is 250 copies / mL. A negative result does not preclude SARS-CoV-2 infection and should not be used as the sole basis for treatment or other patient management decisions.  A negative result may occur with improper specimen collection / handling, submission of specimen other than nasopharyngeal swab, presence of viral mutation(s) within the areas targeted by this assay, and inadequate number of viral copies (<250 copies / mL). A negative  result must be combined with clinical observations, patient history, and epidemiological information.  Fact Sheet for Patients:   StrictlyIdeas.no  Fact Sheet for Healthcare Providers: BankingDealers.co.za  This test is not yet approved or                           cleared by the [place] FDA and has been authorized for detection and/or diagnosis of SARS-CoV-2 by FDA under an Emergency Use Authorization (EUA).  This EUA will remain in effect (meaning this test can be used) for the duration of the COVID-19 declaration under Section 564(b)(1) of the Act, 21 U.S.C. section 360bbb-3(b)(1), unless the authorization is terminated or revoked sooner.  Performed at [facility], [place] [address], [place], [place] [address]   . WBC 02/11/2020 8.5  4.0 - 10.5 K/uL Final  . RBC 02/11/2020 5.95* 4.22 - 5.81 MIL/uL Final  . Hemoglobin 02/11/2020 14.1  13.0 - 17.0 g/dL Final  . HCT 02/11/2020 45.9  39 - 52 % Final  . MCV 02/11/2020 77.1* 80.0 - 100.0 fL Final  . MCH 02/11/2020 23.7* 26.0 - 34.0 pg Final  . MCHC 02/11/2020 30.7  30.0 - 36.0 g/dL Final  . RDW 02/11/2020 15.2  11.5 - 15.5 % Final  . Platelets 02/11/2020 268  150 - 400 K/uL Final  . nRBC 02/11/2020 0.0  0.0 - 0.2 % Final   Performed at [facility], [place] [address], [place], [place] [address]  . Sodium 02/11/2020 141  135 - 145 mmol/L Final  . Potassium 02/11/2020 4.2  3.5 - 5.1 mmol/L Final  . Chloride 02/11/2020 104  98 - 111 mmol/L Final  . CO2 02/11/2020 24  22 - 32 mmol/L Final  . Glucose, Bld 02/11/2020 113* 70 - 99 mg/dL Final   Glucose reference range applies only to samples taken after fasting for at least 8 hours.  . BUN 02/11/2020 14  6 - 20 mg/dL Final  . Creatinine, Ser 02/11/2020 0.85  0.61 - 1.24 mg/dL Final  . Calcium 02/11/2020 9.5  8.9 -  10.3 mg/dL Final  . Total Protein 02/11/2020 6.8  6.5 - 8.1 g/dL Final  . Albumin  02/11/2020 3.8  3.5 - 5.0 g/dL Final  . AST 02/11/2020 20  15 - 41 U/L Final  . ALT 02/11/2020 22  0 - 44 U/L Final  . Alkaline Phosphatase 02/11/2020 60  38 - 126 U/L Final  . Total Bilirubin 02/11/2020 0.6  0.3 - 1.2 mg/dL Final  . GFR calc non Af Amer 02/11/2020 >60  >60 mL/min Final  . GFR calc Af Amer 02/11/2020 >60  >60 mL/min Final  . Anion gap 02/11/2020 13  5 - 15 Final   Performed at [facility], [place] [address] [place], [place] [address]  . Cholesterol 02/11/2020 115  0 - 200 mg/dL Final  . Triglycerides 02/11/2020 85  <150 mg/dL Final  . HDL 02/11/2020 47  >40 mg/dL Final  . Total CHOL/HDL Ratio 02/11/2020 2.4  RATIO Final  . VLDL 02/11/2020 17  0 - 40 mg/dL Final  . LDL Cholesterol 02/11/2020 51  0 - 99 mg/dL Final   Comment:        Total Cholesterol/HDL:CHD Risk Coronary Heart Disease Risk Table                     Men   Women  1/2 Average Risk   3.4   3.3  Average Risk       5.0   4.4  2 X Average Risk   9.6   7.1  3 X Average Risk  23.4   11.0        Use the calculated Patient Ratio above and the CHD Risk Table to determine the patient's CHD Risk.        ATP III CLASSIFICATION (LDL):  <100     mg/dL   Optimal  100-129  mg/dL   Near or Above                    Optimal  130-159  mg/dL   Borderline  160-189  mg/dL   High  >190     mg/dL   Very High Performed at [facility] [address], [place], [place] [address]   . Glucose-Capillary 02/11/2020 132* 70 - 99 mg/dL Final   Glucose reference range applies only to samples taken after fasting for at least 8 hours.  . Glucose-Capillary 02/11/2020 148* 70 - 99 mg/dL Final   Glucose reference range applies only to samples taken after fasting for at least 8 hours.  . Glucose-Capillary 02/11/2020 105* 70 - 99 mg/dL Final   Glucose reference range applies only to samples taken after fasting for at least 8 hours.  . Glucose-Capillary 02/12/2020 78  70 - 99 mg/dL Final   Glucose  reference range applies only to samples taken after fasting for at least 8 hours.  Office Visit on 01/31/2020  Component Date Value Ref Range Status  . Hemoglobin A1C 01/31/2020 7.8* 4.0 - 5.6 % Final  . POC Glucose 01/31/2020 156* 70 - 99 mg/dl Final  Office Visit on 11/23/2019  Component Date Value Ref Range Status  . POC Glucose 11/23/2019 138* 70 - 99 mg/dl Final  . HbA1c, POC (controlled diabetic ra* 11/23/2019 7.5* 0.0 - 7.0 % Final  . Creatinine, Urine 11/23/2019 149.8  Not Estab. mg/dL Final  . Microalbumin, Urine 11/23/2019 66.9  Not Estab. ug/mL Final  . Microalb/Creat Ratio 11/23/2019 45* 0 - 29 mg/g creat Final  Comment:                        Normal:                0 -  29                        Moderately increased: 30 - 300                        Severely increased:       >300   . Uric Acid 11/23/2019 6.0  3.8 - 8.4 mg/dL Final              Therapeutic target for gout patients: <6.0  . Anti Nuclear Antibody (ANA) 11/23/2019 Negative  Negative Final  . Rhuematoid fact SerPl-aCnc 11/23/2019 14.0* 0.0 - 13.9 IU/mL Final  . Sed Rate 11/23/2019 10  0 - 15 mm/hr Final  . WBC 11/23/2019 6.7  3.4 - 10.8 x10E3/uL Final  . RBC 11/23/2019 6.41* 4.14 - 5.80 x10E6/uL Final  . Hemoglobin 11/23/2019 15.3  13.0 - 17.7 g/dL Final  . Hematocrit 11/23/2019 49.0  37.5 - 51.0 % Final  . MCV 11/23/2019 76* 79 - 97 fL Final  . MCH 11/23/2019 23.9* 26.6 - 33.0 pg Final  . MCHC 11/23/2019 31.2* 31 - 35 g/dL Final  . RDW 11/23/2019 15.0  11.6 - 15.4 % Final  . Platelets 11/23/2019 267  150 - 450 x10E3/uL Final  . TSH 11/23/2019 0.575  0.450 - 4.500 uIU/mL Final  . T4, Total 11/23/2019 10.5  4.5 - 12.0 ug/dL Final  . Glucose 11/23/2019 143* 65 - 99 mg/dL Final  . BUN 11/23/2019 11  6 - 24 mg/dL Final  . Creatinine, Ser 11/23/2019 0.83  0.76 - 1.27 mg/dL Final  . GFR calc non Af Amer 11/23/2019 106  >59 mL/min/1.73 Final  . GFR calc Af Amer 11/23/2019 123  >59 mL/min/1.73 Final  .  BUN/Creatinine Ratio 11/23/2019 13  9 - 20 Final  . Sodium 11/23/2019 136  134 - 144 mmol/L Final  . Potassium 11/23/2019 4.5  3.5 - 5.2 mmol/L Final  . Chloride 11/23/2019 102  96 - 106 mmol/L Final  . CO2 11/23/2019 20  20 - 29 mmol/L Final  . Calcium 11/23/2019 9.5  8.7 - 10.2 mg/dL Final  . Total Protein 11/23/2019 7.2  6.0 - 8.5 g/dL Final  . Albumin 11/23/2019 4.3  4.0 - 5.0 g/dL Final  . Globulin, Total 11/23/2019 2.9  1.5 - 4.5 g/dL Final  . Albumin/Globulin Ratio 11/23/2019 1.5  1.2 - 2.2 Final  . Bilirubin Total 11/23/2019 0.5  0.0 - 1.2 mg/dL Final  . Alkaline Phosphatase 11/23/2019 61  39 - 117 IU/L Final  . AST 11/23/2019 36  0 - 40 IU/L Final  . ALT 11/23/2019 37  0 - 44 IU/L Final    Allergies: Patient has no known allergies.  PTA Medications: (Not in a hospital admission)   Medical Decision Making  Patient admitted to Continuous assessment related to EPS will give cogentin; and discharge in morning    Recommendations  Based on my evaluation the patient does not appear to have an emergency medical condition.  Biruk Troia, NP 02/20/20  7:22 PM

## 2020-02-20 NOTE — BH Assessment (Addendum)
Comprehensive Clinical Assessment (CCA) Note  02/20/2020 Duane Salazar [id number]  Visit Diagnosis:      ICD-10-CM   1. Schizoaffective disorder, depressive type ([place])  F25.1      Patient presenting with SI with no plan and requesting medication management. Patient reported current side effects of prescribed psych medications include tightness in jaw, back and knee pain. Patient reported onset of pain and SI is 3 days. Patient reported SI is related to his continual pain. Patient reported "I need some relief from pain". Patient denied SI, HI and psychosis. Patient denied current self-harming behaviors. Patient denied drug alcohol usage stating he has been clean for 28 days and that he was just discharged from [place]. Patient reported 6-8 hours sleep and normal appetite. Patient denied access to guns or weapons.   Patient admits to recently being discharged from [facility] and is aware of his follow up appointments at [facility] 02/25/20 and [facility] on 03/13/20.   CCA Screening, Triage and Referral (STR)  Patient Reported Information How did you hear about [place]? Self  Referral name: No data recorded Referral phone number: No data recorded  Whom do you see for routine medical problems? Primary Care  Practice/Facility Name: [facility]  Practice/Facility Phone Number: No data recorded Name of Contact: No data recorded Contact Number: No data recorded Contact Fax Number: No data recorded Prescriber Name: No data recorded Prescriber Address (if known): No data recorded  What Is the Reason for Your Visit/Call Today? SI with no plan and medication management  How Long Has This Been Causing You Problems? <Week  What Do You Feel Would Help You the Most Today? Medication   Have You Recently Been in Any Inpatient Treatment (Hospital/Detox/Crisis Center/28-Day Program)? Yes  Name/Location of Program/Hospital:[facility] 28-day program  How Long  Were You There? 28 days  When Were You Discharged? No data recorded  Have You Ever Received Services From [facility] Before? Yes  Who Do You See at [facility]? primary care physician at [place] Recently Had Any Thoughts About [place]? Yes  Are You Planning to Commit Suicide/Harm Yourself At This time? No   Have you Recently Had Thoughts About [place]? No  Explanation: No data recorded  Have You Used Any Alcohol or Drugs in the Past 24 Hours? No  How Long Ago Did You Use Drugs or Alcohol? No data recorded What Did You Use and How Much? No data recorded  Do You Currently Have a Therapist/Psychiatrist? Yes  Name of Therapist/Psychiatrist: [facility] 03/13/20   Have You Been Recently Discharged From Any Office Practice or Programs? No  Explanation of Discharge From Practice/Program: No data recorded    CCA Screening Triage Referral Assessment Type of Contact: Face-to-Face  Is this Initial or Reassessment? No data recorded Date Telepsych consult ordered in [facility]:  No data recorded Time Telepsych consult ordered in [facility]:  No data recorded  Patient Reported Information Reviewed? Yes  Patient Left Without Being Seen? No data recorded Reason for Not Completing Assessment: No data recorded  Collateral Involvement: none   Does Patient Have a [place]? No data recorded Name and Contact of Legal Guardian: NA  If Minor and Not Living with Parent(s), Who has Custody? NA  Is CPS involved or ever been involved? Never  Is APS involved or ever been involved? Never   Patient Determined To Be At Risk for  Harm To Self or Others Based on Review of Patient Reported Information or Presenting Complaint? Yes, for Self-Harm  Method: No data recorded Availability of Means: No data recorded Intent: No data recorded Notification Required: No data recorded Additional Information for Danger  to Others Potential: No data recorded Additional Comments for Danger to Others Potential: No data recorded Are There Guns or Other Weapons in Your Home? No data recorded Types of Guns/Weapons: No data recorded Are These Weapons Safely Secured?                            No data recorded Who Could Verify You Are Able To Have These Secured: No data recorded Do You Have any Outstanding Charges, Pending Court Dates, Parole/Probation? No data recorded Contacted To Inform of Risk of Harm To Self or Others: No data recorded  Location of Assessment: [facility] Assessment Services   Does Patient Present under Involuntary Commitment? No  IVC Papers Initial File Date: No data recorded  [place] of Residence: [place]   Patient Currently Receiving the Following Services: Medication Management   Determination of Need: Emergent (2 hours)   Options For Referral: Medication Management   CCA Biopsychosocial  Intake/Chief Complaint:  CCA Intake With Chief Complaint Chief Complaint/Presenting Problem: SI with no plan and medication management Patient's Currently Reported Symptoms/Problems: SI with no plan and medication management Individual's Strengths: good friend Individual's Preferences: medication managment Individual's Abilities: access services Type of Services Patient Feels Are Needed: medication management  Mental Health Symptoms Depression:  Depression: Hopelessness, Duration of symptoms less than two weeks  Mania:  Mania: None  Anxiety:   Anxiety: None  Psychosis:  Psychosis: None  Trauma:  Trauma: None  Obsessions:  Obsessions: Good insight  Compulsions:  Compulsions: N/A  Inattention:  Inattention: N/A  Hyperactivity/Impulsivity:  Hyperactivity/Impulsivity: N/A  Oppositional/Defiant Behaviors:  Oppositional/Defiant Behaviors: N/A  Emotional Irregularity:  Emotional Irregularity: N/A  Other Mood/Personality Symptoms:      Mental Status Exam Appearance and self-care  Stature:      Weight:  Weight: Average weight  Clothing:  Clothing: Age-appropriate  Grooming:  Grooming: Normal  Cosmetic use:  Cosmetic Use: Age appropriate  Posture/gait:  Posture/Gait: Normal  Motor activity:  Motor Activity: Not Remarkable  Sensorium  Attention:  Attention: Normal  Concentration:  Concentration: Normal  Orientation:  Orientation: Person, Place, Situation, Time  Recall/memory:  Recall/Memory: Normal  Affect and Mood  Affect:  Affect: Anxious  Mood:  Mood: Anxious  Relating  Eye contact:  Eye Contact: Normal  Facial expression:  Facial Expression: Sad  Attitude toward examiner:  Attitude Toward Examiner: Cooperative  Thought and Language  Speech flow: Speech Flow: Slurred, Other (Comment) (due to side effects of numbness of mouth)  Thought content:  Thought Content: Appropriate to Mood and Circumstances  Preoccupation:  Preoccupations: None  Hallucinations:  Hallucinations: None  Organization:     Transport planner of Knowledge:  Fund of Knowledge: Average  Intelligence:  Intelligence: Average  Abstraction:  Abstraction: Normal  Judgement:  Judgement: Fair  Art therapist:     Insight:  Insight: Fair  Decision Making:  Decision Making: Normal  Social Functioning  Social Maturity:  Social Maturity:  Pincus Badder)  Social Judgement:  Social Judgement: Normal  Stress  Stressors:  Stressors: Other (Comment) (medication side effects)  Coping Ability:  Coping Ability: Normal  Skill Deficits:  Skill Deficits:  Pincus Badder)  Supports:       Religion:  Religion/Spirituality Are You A Religious Person?:  Pincus Badder) How Might This Affect Treatment?: uta  Leisure/Recreation: Leisure / Recreation Do You Have Hobbies?:  Pincus Badder)  Exercise/Diet: Exercise/Diet Do You Exercise?:  (uta) Have You Gained or Lost A Significant Amount of Weight in the Past Six Months?: No Do You Follow a Special Diet?: No Do You Have Any Trouble Sleeping?: No   CCA Employment/Education   Employment/Work Situation: Employment / Work Situation Employment situation: On disability Why is patient on disability: mental illness How long has patient been on disability: uta Patient's job has been impacted by current illness:  (uta) What is the longest time patient has a held a job?: uta Where was the patient employed at that time?: [place]  Education: Education Last Grade Completed: 12 Name of [place]: uta Did Teacher, adult education From [employer]?: Yes Did Physicist, medical?: No Did [place]?: No Did You Have Any Special Interests In School?: uta Did You Have An Individualized Education Program (IIEP):  Pincus Badder) Did You Have Any Difficulty At School?:  Pincus Badder) Patient's Education Has Been Impacted by Current Illness:  (uta)   CCA Family/Childhood History  Family and Relationship History: Family history Are you sexually active?:  (n/a) What is your sexual orientation?: uta Has your sexual activity been affected by drugs, alcohol, medication, or emotional stress?: uta Does patient have children?:  (uta)  Childhood History:  Childhood History By whom was/is the patient raised?: Mother Additional childhood history information: uta Description of patient's relationship with caregiver when they were a child: uta Patient's description of current relationship with people who raised him/her: uta How were you disciplined when you got in trouble as a child/adolescent?: uta Does patient have siblings?:  Pincus Badder) Did patient suffer any verbal/emotional/physical/sexual abuse as a child?:  (uta) Did patient suffer from severe childhood neglect?:  (uta) Has patient ever been sexually abused/assaulted/raped as an adolescent or adult?:  (uta) Was the patient ever a victim of a crime or a disaster?:  (uta.) Witnessed domestic violence?:  (uta) Has patient been affected by domestic 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)  Child/Adolescent Assessment:    CCA Substance Use   Alcohol/Drug Use: Alcohol / Drug Use Pain Medications: see MAR Prescriptions: see MAR Over the Counter: see MAR History of alcohol / drug use?: Yes Longest period of sobriety (when/how long): 28 days, patient has not used drugs or alcohol in 28 days Negative Consequences of Use: Legal, Personal relationships Withdrawal Symptoms:  (none, patient clean for 28 days) Substance #1 Name of Substance 1: alcohol 1 - Age of First Use: "young" 1 - Amount (size/oz): varies 1 - Frequency: daily 1 - Last Use / Amount: over 28 days ago     ASAM's:  Six Dimensions of Multidimensional Assessment  Dimension 1:  Acute Intoxication and/or Withdrawal Potential:      Dimension 2:  Biomedical Conditions and Complications:      Dimension 3:  Emotional, Behavioral, or Cognitive Conditions and Complications:     Dimension 4:  Readiness to Change:     Dimension 5:  Relapse, Continued use, or Continued Problem Potential:     Dimension 6:  Recovery/Living Environment:     ASAM Severity Score:    ASAM Recommended Level of Treatment:     Substance use Disorder (SUD)    Recommendations for Services/Supports/Treatments:    DSM5 Diagnoses: Patient Active Problem List   Diagnosis Date Noted  . Schizoaffective disorder ([place]) 02/11/2020  . Schizophrenia ([place]) 02/10/2020  .  Hep C w/o coma, chronic ([place]) 10/21/2015  . Injury of right Achilles tendon 10/17/2015  . Tinea pedis 10/17/2015  . Onychomycosis of toenail 10/17/2015  . Secondary adrenal insufficiency ([place]) 10/17/2015  . Alcohol use disorder, mild, abuse 09/18/2015  . Cannabis use disorder, mild, abuse 09/18/2015  . Opioid use disorder, moderate, dependence ([place]) 09/18/2015  . Tobacco use disorder 09/18/2015  . Cocaine use disorder, severe, dependence ([place]) 09/16/2015  . Schizoaffective disorder, depressive type ([place])   . Suicidal ideation 05/07/2015  . 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 Chronic pain syndrome 03/11/2015  .  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 Chronic headache 01/02/2015  . 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 HTN (hypertension) 10/21/2014  . Morbid obesity with BMI of 40.0-44.9, adult ([place]) 10/21/2014  . Sleep apnea   . 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 Arthritis   . Diabetes type 2, uncontrolled ([place])   . Adrenal insufficiency ([place]) 12/23/2005    Referrals to Alternative Service(s): Referred to Alternative Service(s):   Place:   Date:   Time:    Referred to Alternative Service(s):   Place:   Date:   Time:    Referred to Alternative Service(s):   Place:   Date:   Time:    Referred to Alternative Service(s):   Place:   Date:   Time:       Venora Maples  Comprehensive Clinical Assessment (CCA) Screening, Triage and Referral Note  02/20/2020 Duane Salazar [id number]  Visit Diagnosis:    ICD-10-CM   1. Schizoaffective disorder, depressive type ([place])  F25.1     Patient Reported Information How did you hear about [place]? Self   Referral name: No data recorded  Referral phone number: No data recorded Whom do you see for routine medical problems? Primary Care   Practice/Facility Name: [facility]   Practice/Facility Phone Number: No data recorded  Name of Contact: No data recorded  Contact Number: No data recorded  Contact Fax Number: No data recorded  Prescriber Name: No data recorded  Prescriber Address (if known): No data recorded What Is the Reason for Your Visit/Call Today? SI with no plan and medication management  How Long Has This Been Causing You Problems? <Week  Have You Recently Been in Any Inpatient Treatment (Hospital/Detox/Crisis Center/28-Day Program)? Yes   Name/Location of Program/Hospital:[facility] 28-day program   How Long Were You There? 28 days   When Were You Discharged? No data recorded Have You Ever Received Services From [facility] Before? Yes   Who Do You See at [facility]? primary care physician at [place] Recently Had Any Thoughts About [place]? Yes   Are You Planning to Commit Suicide/Harm Yourself At This time?  No  Have you Recently Had Thoughts About [place]? No   Explanation: No data recorded Have You Used Any Alcohol or Drugs in the Past 24 Hours? No   How Long Ago Did You Use Drugs or Alcohol?  No data recorded  What Did You Use and How Much? No data recorded What Do You Feel Would Help You the Most Today? Medication  Do You Currently Have a Therapist/Psychiatrist? Yes   Name of Therapist/Psychiatrist: [facility] 03/13/20   Have You Been Recently Discharged From Any Office Practice or Programs? No   Explanation of Discharge From Practice/Program:  No data recorded    CCA Screening Triage Referral Assessment Type of Contact: Face-to-Face  Is this Initial or Reassessment? No data recorded  Date Telepsych consult ordered in CH[facility]:  No data recorded  Time Telepsych consult ordered in [facility]L:  No data recorded Patient Reported Information Reviewed? Yes   Patient Left Without Being Seen? No data recorded  Reason for Not Completing Assessment: No data recorded Collateral Involvement: none  Does Patient Have a [place]? No data recorded  Name and Contact of Legal Guardian:  NA  If Minor and Not Living with Parent(s), Who has Custody? NA  Is CPS involved or ever been involved? Never  Is APS involved or ever been involved? Never  Patient Determined To Be At Risk for Harm To Self or Others Based on Review of Patient Reported Information or Presenting Complaint? Yes, for Self-Harm   Method: No data recorded  Availability of Means: No data recorded  Intent: No data recorded  Notification Required: No data recorded  Additional Information for Danger to Others Potential:  No data recorded  Additional Comments for Danger to Others Potential:  No data recorded  Are There Guns or Other  Weapons in Your Home?  No data recorded   Types of Guns/Weapons: No data recorded   Are These Weapons Safely Secured?                              No data recorded   Who Could Verify You Are Able To Have These Secured:    No data recorded Do You Have any Outstanding Charges, Pending Court Dates, Parole/Probation? No data recorded Contacted To Inform of Risk of Harm To Self or Others: No data recorded Location of Assessment: [facility] Assessment Services  Does Patient Present under Involuntary Commitment? No   IVC Papers Initial File Date: No data recorded  [place] of Residence: [place]  Patient Currently Receiving the Following Services: Medication Management   Determination of Need: Emergent (2 hours)   Options For Referral: Medication Management  Shuvaun Rankin, NP, patient accepted to overnight observation.   Venora Maples, [facility]

## 2020-02-20 NOTE — ED Triage Notes (Signed)
Patient here from home stating that he received his COVID vaccinations 2 weeks ago and received his Haldol injection in the same arm "in the same spot" 1 week ago and think he could be having a reaction. States that now is mouth is twitching.

## 2020-02-20 NOTE — ED Provider Notes (Signed)
[facility] DEPT Provider Note   CSN: [id number] Arrival date & time: 02/20/20  1350     History Chief Complaint  Patient presents with  . Medication Reaction    Duane Salazar is a 44 y.o. male.  HPI 44 year old male with a history of pituitary macroadenoma, AKI, seizures, schizophrenia, hypertension, DM type II presents to the ER with a medication reaction.  Patient states that he got the Covid vaccine approximately 2 weeks ago, and then received an injection of Haldol in that same arm a week after.  Since then he has been feeling like his jaws tightening, and is having trouble opening his jaw.  He also feels like his throat has been closing up.  He has been able to tolerate liquids however.  No fevers or chills, no sore throat, no drooling.  No rash, headache, nausea, vomiting, abdominal pain.    Past Medical History:  Diagnosis Date  . 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 AKI (acute kidney injury) ([place]) 03/21/2015  . Arthritis Dx 2002  . Diabetes type 2, controlled ([place]) 2010  . Headache   . Hypertension Dx 2002  . Pituitary macroadenoma ([place]) 2004  . Schizo affective schizophrenia ([place]) 1995  . Schizophrenia ([place])   . Seizures ([employer])   . Sleep apnea 1995   on CPAP    Patient Active Problem List   Diagnosis Date Noted  . Schizoaffective disorder ([place]) 02/11/2020  . Schizophrenia ([place]) 02/10/2020  . Hep C w/o coma, chronic ([place]) 10/21/2015  . Injury of right Achilles tendon 10/17/2015  . Tinea pedis 10/17/2015  . Onychomycosis of toenail 10/17/2015  . 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 Alcohol use disorder, mild, abuse 09/18/2015  . Cannabis use disorder, mild, abuse 09/18/2015  . Opioid use disorder, moderate, dependence ([place]) 09/18/2015  . Tobacco use disorder 09/18/2015  . Cocaine use disorder, severe, dependence ([place]) 09/16/2015  . Schizoaffective disorder, depressive type ([place])   . Suicidal ideation 05/07/2015  .  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 Chronic pain syndrome 03/11/2015  . Pituitary macroadenoma ([place]) 03/11/2015  . Chronic headache 01/02/2015  . 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 HTN (hypertension) 10/21/2014  . Morbid obesity with BMI of 40.0-44.9, adult ([place]) 10/21/2014  . Sleep apnea   . 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[place])   . Arthritis   . Diabetes type 2, uncontrolled ([place])   . Adrenal insufficiency ([place]) 12/23/2005    Past Surgical History:  Procedure Laterality Date  . PITUITARY SURGERY  2005 & 2012  . SKIN BIOPSY         Family History  Problem Relation Age of Onset  . Hypertension Mother   . Diabetes Mother   . Heart Problems Mother   . Cancer Maternal Uncle   . Alcoholism Maternal Uncle   . Cancer Maternal Grandmother   . Heart disease Neg Hx     Social History   Tobacco Use  . Smoking status: Current Every Day Smoker    Packs/day: 1.00    Years: 20.00    Pack years: 20.00    Types: Cigarettes  . Smokeless tobacco: Never Used  . Tobacco comment: Smoking .5 ppd  Substance Use Topics  . Alcohol use: Yes    Alcohol/week: 2.0 standard drinks    Types: 2 Cans of beer per week    Comment: 2 40s a day  . Drug use: Yes    Types: Cocaine, Marijuana, Heroin    Comment: Reports used  3 weeks ago. 10/17/2015    Home Medications Prior to Admission medications   Medication Sig Start Date End Date Taking? Authorizing Provider  amLODipine (NORVASC) 5 MG tablet Take 1 tablet (5 mg total) by mouth daily. 02/12/20   Connye Burkitt, NP  atorvastatin (LIPITOR) 40 MG tablet Take 1 tablet (40 mg total) by mouth daily. Must have OV for refills 02/12/20   Connye Burkitt, NP  gabapentin (NEURONTIN) 600 MG tablet 1 pill twice per day and 2 at bedtime 02/12/20   Connye Burkitt, NP  glipiZIDE (GLUCOTROL XL) 10 MG 24 hr tablet Take 1 tablet (10 mg total) by mouth daily with breakfast. To lower blood sugar 02/12/20   Connye Burkitt, NP  haloperidol  (HALDOL) 5 MG tablet Take 1 tablet (5 mg total) by mouth 2 (two) times daily. 02/12/20   Connye Burkitt, NP  hydrALAZINE (APRESOLINE) 25 MG tablet 1 in the morning and 1 with lunch 02/12/20   Connye Burkitt, NP  losartan-hydrochlorothiazide (HYZAAR) 100-25 MG tablet Take 1 tablet by mouth daily. To lower blood pressure 02/12/20   Connye Burkitt, NP  metFORMIN (GLUCOPHAGE) 1000 MG tablet Take 1 tablet (1,000 mg total) by mouth 2 (two) times daily with a meal. For diabetes management 02/12/20   Connye Burkitt, NP  pioglitazone (ACTOS) 30 MG tablet Take 1 tablet (30 mg total) by mouth daily. 02/12/20   Connye Burkitt, NP  predniSONE (DELTASONE) 5 MG tablet Take 1.5 tablets (7.5 mg total) by mouth daily with breakfast. 02/12/20   Connye Burkitt, NP    Allergies    Patient has no known allergies.  Review of Systems   Review of Systems  Constitutional: Negative for chills and fever.  HENT: Positive for trouble swallowing and voice change. Negative for congestion, dental problem, drooling and sore throat.   Respiratory: Negative for cough and shortness of breath.   Cardiovascular: Negative for chest pain.  Gastrointestinal: Negative for abdominal pain, nausea and vomiting.    Physical Exam Updated Vital Signs BP (!) 156/116 (BP Location: Left Arm)   Pulse 96   Temp 98.2 F (36.8 C) (Oral)   Resp 17   SpO2 100%   Physical Exam Vitals and nursing note reviewed.  Constitutional:      General: He is not in acute distress.    Appearance: Normal appearance. He is well-developed. He is obese. He is not ill-appearing or toxic-appearing.  HENT:     Head: Normocephalic and atraumatic.     Mouth/Throat:     Mouth: Mucous membranes are moist.     Pharynx: No oropharyngeal exudate or posterior oropharyngeal erythema.     Comments: Throat not erythematous.  Absent uvula.  No tonsillar exudate/swelling.  No sublingual/mandibular swelling.  Tongue normal size.  No visible drooling.  No parotid swelling.   No trismus. Eyes:     Conjunctiva/sclera: Conjunctivae normal.  Cardiovascular:     Rate and Rhythm: Normal rate and regular rhythm.     Pulses: Normal pulses.     Heart sounds: Normal heart sounds. No murmur heard.   Pulmonary:     Effort: Pulmonary effort is normal. No respiratory distress.     Breath sounds: Normal breath sounds.     Comments: Clear lung sounds, respirations equal and regular, not tachypneic Abdominal:     General: Abdomen is flat.     Palpations: Abdomen is soft.     Tenderness: There is no abdominal tenderness.  Comments: Soft and nontender abdomen  Musculoskeletal:        General: No tenderness or deformity. Normal range of motion.     Cervical back: Neck supple. No rigidity or tenderness.  Skin:    General: Skin is warm and dry.     Comments: No visible rashes  Neurological:     General: No focal deficit present.     Mental Status: He is alert and oriented to person, place, and time.     Cranial Nerves: No cranial nerve deficit.     Motor: No weakness.  Psychiatric:        Mood and Affect: Mood normal.        Behavior: Behavior normal.     ED Results / Procedures / Treatments   Labs (all labs ordered are listed, but only abnormal results are displayed) Labs Reviewed - No data to display  EKG None  Radiology No results found.  Procedures Procedures (including critical care time)  Medications Ordered in ED Medications - No data to display  ED Course  I have reviewed the triage vital signs and the nursing notes.  Pertinent labs & imaging results that were available during my care of the patient were reviewed by me and considered in my medical decision making (see chart for details).    MDM Rules/Calculators/A&P                         44 year old with feeling of difficulty opening his jaw, and throat tightening since his Haldol injection a week ago. On presentation, the patient is alert and oriented, nontoxic-appearing, no acute  distress, without evidence of increased work of breathing or drooling.  He states that he feels as though he is speaking differently, however I do not notice any change in phonation.  Oropharynx without evidence of tonsillar swelling or deviation. No evidence of Ludwig's.  Doubt peritonsillar/retropharyngeal abscess.  He has no rashes, no abdominal pain, no tachypnea, doubt anaphylaxis.  Do not think he needs additional CT imaging at this time.  Patient was also seen and evaluated Dr. Wilson Singer, and we agree that the symptoms may be a reaction to the Haldol.  However there is no evidence of tardyve dyskinesia at this time.  I encouraged the patient to follow-up with his primary care doctor.  He voices understanding and is overall reassured.  Strict return precautions given.  At this stage in the ED course, the patient medically screened and stable for discharge.   Final Clinical Impression(s) / ED Diagnoses Final diagnoses:  Adverse effect of drug, initial encounter    Rx / DC Orders ED Discharge Orders    None       Lyndel Safe 02/20/20 1730    Virgel Manifold, MD 02/22/20 2312

## 2020-02-20 NOTE — ED Notes (Signed)
Pt left before receiving discharge paperwork. Pts mother here to pick up pt is wondering why the pt was being discharged. MD and PA made aware. PA will speak with mother and address her concerns at this time.

## 2020-02-20 NOTE — Discharge Instructions (Signed)
Please follow-up with your primary care doctor about the symptoms you are experiencing today.  Please return to the ER if you have worsening symptoms.

## 2020-02-20 NOTE — ED Triage Notes (Signed)
Pt5 states [facility] sent him here to monitor symptoms of haldol shot

## 2020-02-20 NOTE — Discharge Instructions (Addendum)
Start Cogentin 0.5 mg by mouth twice a day Follow up with outpatient provider [place] Go on 02/25/2020.   Specialty: Professional Counselor Why: You are scheduled for therapy on 02/25/20 at 2:00 pm.  This appointment will be held in person.  Contact information: [place] [phone] [place]. Go on 03/13/2020.   Specialty: Behavioral Health Why: You have an appointment on 03/13/20 at 9:00 am with Dr. Toy Care for medication management.  This appointment will be held in person.  Please arrive at 8:30 am to complete paperwork. Contact information: [address]

## 2020-02-20 NOTE — ED Provider Notes (Addendum)
Behavioral Health Medical Screening Exam  Duane Salazar is a 44 y.o. male patient presented to [facility] with complaints of tightness of jaws "feel like they want to lock up.  I got a haldol injection and is suppose to take the haldol pil to."  Patient admits to recently being discharged from [facility] and is aware of his follow up appointments at [facility] 02/25/20 and [facility] on 03/13/20.    During assessment patient has stated he was suicidal and hearing voices.  Then later after he found out he was going to stay admitted that he was not suicidal/homicidal and not having any hallucinations; patient asked why he lied and he stated "cause I wanted to make sure I was going to be able to stay and get my stuff to fix my jaw."   Total Time spent with patient: 30 minutes  Psychiatric Specialty Exam  Presentation  General Appearance:Appropriate for Environment;Casual  Eye Contact:Good  Speech:Clear and Coherent;Normal Rate  Speech Volume:Normal  Handedness:Right   Mood and Affect  Mood:Anxious;Labile  Affect:Congruent   Thought Process  Thought Processes:Coherent;Goal Directed  Descriptions of Associations:Intact  Orientation:Full (Time, Place and Person)  Thought Content:WDL  Hallucinations:None  Ideas of Reference:None  Suicidal Thoughts:No  Homicidal Thoughts:No   Sensorium  Memory:Immediate Good;Recent Good;Remote Good  Judgment:Intact  Insight:Present   Executive Functions  Concentration:Good  Attention Span:Good  [place]  Language:Good   Psychomotor Activity  Psychomotor Activity:Extrapyramidal Side Effects (EPS) Dystonia (Complaints of stiffness of jaw) Yes (Not available at this time)   Assets  Assets:Communication Skills;Desire for Improvement   Sleep  Sleep:Fair  Number of hours: No data recorded  Physical Exam: Physical Exam Vitals and nursing note reviewed.  Pulmonary:     Effort:  Pulmonary effort is normal.  Musculoskeletal:        General: Normal range of motion.     Comments: No other abnormal movement noted other that jaw when talking   Skin:    General: Skin is warm and dry.  Neurological:     Mental Status: He is alert.  Psychiatric:        Attention and Perception: Attention and perception normal.        Mood and Affect: Mood and affect normal.        Speech: Speech normal.        Behavior: Behavior is cooperative.        Thought Content: Thought content normal. Thought content is not paranoid. Thought content does not include homicidal or suicidal ideation.        Cognition and Memory: Cognition and memory normal.        Judgment: Judgment is impulsive.    Review of Systems  Musculoskeletal:       Tightness in jaw related to haldol  Psychiatric/Behavioral: Negative for hallucinations, memory loss and suicidal ideas. Depression: Stable.  All other systems reviewed and are negative.  Blood pressure (!) 141/103, pulse 91, temperature (!) 97.5 F (36.4 C), temperature source Tympanic, resp. rate 18, height 6\' 3"  (1.905 m), weight 299 lb (135.6 kg), SpO2 98 %. Body mass index is 37.37 kg/m.  Musculoskeletal: Strength & Muscle Tone: within normal limits Gait & Station: normal Patient leans: N/A   Recommendations:  Continuous assessment; Give Cogentin IM and add to home medications; discharge in morning  Based on my evaluation the patient does not appear to have an emergency medical condition.  Merica Prell, NP 02/20/2020, 7:01 PM

## 2020-02-21 LAB — POC SARS CORONAVIRUS 2 AG: SARS Coronavirus 2 Ag: NEGATIVE

## 2020-02-21 LAB — GLUCOSE, CAPILLARY: Glucose-Capillary: 128 mg/dL — ABNORMAL HIGH (ref 70–99)

## 2020-02-21 MED ORDER — BENZTROPINE MESYLATE 0.5 MG PO TABS: 0.5000 mg | ORAL_TABLET | Freq: Two times a day (BID) | ORAL | 0 refills | Status: DC

## 2020-02-21 MED FILL — ?BENZTROPINE MES 0.5 MG TAB: 0.5 | 30 days supply | Qty: 60 | Fill #0

## 2020-02-21 NOTE — ED Notes (Signed)
Pt reports this morning an abatement of concerning symptoms of muscle tightness, believes d/t cogentin. Reports feeling relieved.

## 2020-02-21 NOTE — ED Notes (Signed)
Received Duane Salazar this AM at the change of shift, he woke to use the bathroom, VS and blood sugar check and received a nourishment of his choice. He was medicated per order. He talked with the NP, his mother is in route to transport him home. He received his prescription and AVS. His questions were answered and escorted to the lobby area.

## 2020-02-21 NOTE — ED Notes (Signed)
Pt resting on pull out with eyes closed unlabored respirations. NAD. Continuous monitoring continues.  
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
## 2020-02-21 NOTE — ED Notes (Signed)
Pt escorted to the lobby area at 0930 hrs with his personal beongings.

## 2020-02-29 ENCOUNTER — Ambulatory Visit: Payer: Self-pay | Attending: Internal Medicine | Admitting: Internal Medicine

## 2020-02-29 ENCOUNTER — Other Ambulatory Visit: Payer: Self-pay

## 2020-03-07 ENCOUNTER — Ambulatory Visit: Payer: Self-pay

## 2020-03-13 ENCOUNTER — Other Ambulatory Visit: Payer: Self-pay

## 2020-03-13 ENCOUNTER — Encounter (HOSPITAL_COMMUNITY): Payer: Self-pay | Admitting: Psychiatry

## 2020-03-13 ENCOUNTER — Ambulatory Visit (INDEPENDENT_AMBULATORY_CARE_PROVIDER_SITE_OTHER): Payer: No Payment, Other | Admitting: *Deleted

## 2020-03-13 ENCOUNTER — Encounter (HOSPITAL_COMMUNITY): Payer: Self-pay

## 2020-03-13 ENCOUNTER — Ambulatory Visit (INDEPENDENT_AMBULATORY_CARE_PROVIDER_SITE_OTHER): Payer: No Payment, Other | Admitting: Psychiatry

## 2020-03-13 ENCOUNTER — Other Ambulatory Visit (HOSPITAL_COMMUNITY): Payer: Self-pay | Admitting: Psychiatry

## 2020-03-13 VITALS — BP 143/107 | HR 70 | Temp 97.7°F | Ht 75.0 in | Wt 298.0 lb

## 2020-03-13 DIAGNOSIS — E1165 Type 2 diabetes mellitus with hyperglycemia: Secondary | ICD-10-CM

## 2020-03-13 DIAGNOSIS — F251 Schizoaffective disorder, depressive type: Secondary | ICD-10-CM

## 2020-03-13 MED ORDER — BENZTROPINE MESYLATE 0.5 MG PO TABS: 0.5000 mg | ORAL_TABLET | Freq: Two times a day (BID) | ORAL | 1 refills | Status: DC

## 2020-03-13 MED ORDER — HALOPERIDOL DECANOATE 100 MG/ML IM SOLN
100.0000 mg | Freq: Once | INTRAMUSCULAR | Status: AC
  Administered 2020-03-13: 100 mg via INTRAMUSCULAR

## 2020-03-13 MED ORDER — GABAPENTIN 600 MG PO TABS: ORAL_TABLET | ORAL | 1 refills | Status: DC

## 2020-03-13 MED ORDER — HALOPERIDOL DECANOATE 50 MG/ML IM SOLN: 100.0000 mg | INTRAMUSCULAR | 11 refills | Status: DC

## 2020-03-13 MED FILL — BENZTROPINE MES 0.5 MG TAB: 0.5 | 30 days supply | Qty: 60 | Fill #0

## 2020-03-13 MED FILL — GABAPENTIN 600 MG TABLET: 600 | 30 days supply | Qty: 120 | Fill #0
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
## 2020-03-17 ENCOUNTER — Telehealth (HOSPITAL_COMMUNITY): Payer: Self-pay | Admitting: *Deleted

## 2020-03-17 NOTE — Telephone Encounter (Signed)
Pharmacist brought to this writers attention that patient can get his Haldol D at [facility] for free since it is one of the medications on their free list. Called and left a message on this phone asking him to pick it up at the [facility] clinic for free prior to his next injection appt. Will have Dr send a RX to the clinic for him to pick up

## 2020-03-17 NOTE — Telephone Encounter (Signed)
Notified Duane Salazar via VM to pick up his Haldol D at [facility] for free before his next appt. Will ask Dr Toy Care to send a RX there for him to be able to pick it up.

## 2020-03-17 NOTE — Telephone Encounter (Signed)
Prescription with 11 refills was sent to his pharmacy on July 15.

## 2020-04-09 ENCOUNTER — Ambulatory Visit (HOSPITAL_COMMUNITY): Payer: No Payment, Other

## 2020-04-09 ENCOUNTER — Ambulatory Visit (HOSPITAL_COMMUNITY): Payer: No Payment, Other | Admitting: Psychiatry

## 2020-04-10 ENCOUNTER — Telehealth (HOSPITAL_COMMUNITY): Payer: Self-pay | Admitting: *Deleted

## 2020-04-10 NOTE — Telephone Encounter (Signed)
Missed scheduled appt yesterday for his injection. Tried both of the phone numbers listed but no answer on both numbers.

## 2020-04-25 ENCOUNTER — Ambulatory Visit (HOSPITAL_COMMUNITY): Payer: No Payment, Other

## 2020-04-25 ENCOUNTER — Encounter (HOSPITAL_COMMUNITY): Payer: Self-pay

## 2020-04-25 ENCOUNTER — Other Ambulatory Visit: Payer: Self-pay

## 2020-04-25 ENCOUNTER — Ambulatory Visit (HOSPITAL_COMMUNITY): Payer: No Payment, Other | Admitting: *Deleted

## 2020-04-25 VITALS — BP 141/102 | HR 87 | Wt 276.0 lb

## 2020-04-25 DIAGNOSIS — F251 Schizoaffective disorder, depressive type: Secondary | ICD-10-CM

## 2020-04-25 NOTE — Progress Notes (Signed)
Called stating her got the letter writer sent him after he missed his scheduled appt. He came in shortly after calling to ask when he could come. He states he believes the Haldol D he is taking is making him intolerant to the heat. 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 He is pleasant and appropriate. Denies any sx of psychosis. When asked how he was doing he said "great" asked why he was great he said because God blessed him with another day. He reports being sober. He has been enjoying his three grandkids along with a male friend who also has grandkids and they take them to the water parks. He asked for a therapy appt and was scheduled one along with a return appt in one month for his shot and in two months to see a provider. Today he received 100 mg Haldol D in his R deltoid.

## 2020-04-28 ENCOUNTER — Telehealth (HOSPITAL_COMMUNITY): Payer: Self-pay | Admitting: *Deleted

## 2020-04-28 MED ORDER — HALOPERIDOL DECANOATE 100 MG/ML IM SOLN
100.0000 mg | INTRAMUSCULAR | Status: DC
  Administered 2020-04-25: 100 mg via INTRAMUSCULAR

## 2020-04-28 NOTE — Addendum Note (Signed)
Addended by: Nevada Crane on: 04/28/2020 08:38 AM   Modules accepted: Orders

## 2020-04-28 NOTE — Telephone Encounter (Signed)
Order for haldol decanoate placed in chart. Important to note that pt no-showed for his f/up appt with the writer on 8/11.

## 2020-04-28 NOTE — Telephone Encounter (Signed)
Here a week late for monthly injection of Haldol D. He was given his injection as ordered but there was not an order in his chart for me to open his MAR to document it. Will forward this concern to Dr Toy Care.

## 2020-05-01 ENCOUNTER — Ambulatory Visit: Payer: Self-pay | Admitting: Family Medicine

## 2020-05-09 ENCOUNTER — Ambulatory Visit (HOSPITAL_COMMUNITY): Payer: Self-pay | Admitting: Behavioral Health

## 2020-05-13 ENCOUNTER — Telehealth (HOSPITAL_COMMUNITY): Payer: Self-pay | Admitting: *Deleted

## 2020-05-13 ENCOUNTER — Other Ambulatory Visit (HOSPITAL_COMMUNITY): Payer: Self-pay | Admitting: Psychiatry

## 2020-05-13 DIAGNOSIS — F251 Schizoaffective disorder, depressive type: Secondary | ICD-10-CM

## 2020-05-13 MED ORDER — BENZTROPINE MESYLATE 0.5 MG PO TABS: 0.5000 mg | ORAL_TABLET | Freq: Two times a day (BID) | ORAL | 2 refills | Status: DC

## 2020-05-13 MED FILL — ?BENZTROPINE MES 0.5 MG TAB: 0.5 | 30 days supply | Qty: 60 | Fill #0

## 2020-05-13 NOTE — Telephone Encounter (Signed)
Walk in stating he is out of Cogentin and is feeling restless and needs it. He is current on his shot, next appt is on 9/27. Consulted with Brittney NP for a RX [facility]. His mom brought him here, and is able to take him to get his RX. He has an appt with Dr Toy Care on 10/27 to discuss changing his meds

## 2020-05-20 ENCOUNTER — Emergency Department (HOSPITAL_COMMUNITY): Payer: Self-pay

## 2020-05-20 ENCOUNTER — Other Ambulatory Visit: Payer: Self-pay

## 2020-05-20 ENCOUNTER — Encounter (HOSPITAL_COMMUNITY): Payer: Self-pay

## 2020-05-20 ENCOUNTER — Emergency Department (HOSPITAL_COMMUNITY)
Admission: EM | Admit: 2020-05-20 | Discharge: 2020-05-21 | Disposition: A | Discharge status: Home / Self Care | Payer: Self-pay | Attending: Emergency Medicine | Admitting: Emergency Medicine

## 2020-05-20 DIAGNOSIS — F419 Anxiety disorder, unspecified: Secondary | ICD-10-CM | POA: Insufficient documentation

## 2020-05-20 DIAGNOSIS — I10 Essential (primary) hypertension: Secondary | ICD-10-CM | POA: Insufficient documentation

## 2020-05-20 DIAGNOSIS — F17210 Nicotine dependence, cigarettes, uncomplicated: Secondary | ICD-10-CM | POA: Insufficient documentation

## 2020-05-20 DIAGNOSIS — R079 Chest pain, unspecified: Secondary | ICD-10-CM

## 2020-05-20 DIAGNOSIS — E119 Type 2 diabetes mellitus without complications: Secondary | ICD-10-CM | POA: Insufficient documentation

## 2020-05-20 DIAGNOSIS — Z7984 Long term (current) use of oral hypoglycemic drugs: Secondary | ICD-10-CM | POA: Insufficient documentation

## 2020-05-20 DIAGNOSIS — Z20822 Contact with and (suspected) exposure to covid-19: Secondary | ICD-10-CM | POA: Insufficient documentation

## 2020-05-20 DIAGNOSIS — Z79899 Other long term (current) drug therapy: Secondary | ICD-10-CM | POA: Insufficient documentation

## 2020-05-20 LAB — CBC
HCT: 47.5 % (ref 39.0–52.0)
Hemoglobin: 15.3 g/dL (ref 13.0–17.0)
MCH: 24.4 pg — ABNORMAL LOW (ref 26.0–34.0)
MCHC: 32.2 g/dL (ref 30.0–36.0)
MCV: 75.8 fL — ABNORMAL LOW (ref 80.0–100.0)
Platelets: 243 10*3/uL (ref 150–400)
RBC: 6.27 MIL/uL — ABNORMAL HIGH (ref 4.22–5.81)
RDW: 15.1 % (ref 11.5–15.5)
WBC: 9 10*3/uL (ref 4.0–10.5)
nRBC: 0 % (ref 0.0–0.2)

## 2020-05-20 LAB — BASIC METABOLIC PANEL
Anion gap: 11 (ref 5–15)
BUN: 11 mg/dL (ref 6–20)
CO2: 20 mmol/L — ABNORMAL LOW (ref 22–32)
Calcium: 8.8 mg/dL — ABNORMAL LOW (ref 8.9–10.3)
Chloride: 102 mmol/L (ref 98–111)
Creatinine, Ser: 0.62 mg/dL (ref 0.61–1.24)
GFR calc Af Amer: >60 mL/min (ref >60)
GFR calc non Af Amer: >60 mL/min (ref >60)
Glucose, Bld: 126 mg/dL — ABNORMAL HIGH (ref 70–99)
Potassium: 3.7 mmol/L (ref 3.5–5.1)
Sodium: 133 mmol/L — ABNORMAL LOW (ref 135–145)

## 2020-05-20 LAB — TROPONIN I (HIGH SENSITIVITY): Troponin I (High Sensitivity): 6 ng/L (ref <18)

## 2020-05-20 LAB — SARS CORONAVIRUS 2 BY RT PCR (HOSPITAL ORDER, PERFORMED IN ~~LOC~~ HOSPITAL LAB): SARS Coronavirus 2: NEGATIVE

## 2020-05-20 NOTE — ED Provider Notes (Signed)
[facility] DEPT Provider Note   CSN: [id number] Arrival date & time: 05/20/20  7824     History No chief complaint on file.   Duane Salazar is a 44 y.o. male.  44 yo M with a chief complaints of chest pain.  This been going on for months.  Usually worse when his anxiety worsens.  Has been having issues with his anxiety as well.  Just feels like his head is not right.  Typically gets in injection medicine for his schizophrenia.  States that he feels like it is not working right.  Nothing else seems to make his chest pain worse.  Describes it as left-sided and uncomfortable.  Feels like a tightness.  No difficulty breathing no diaphoresis no nausea or vomiting.  Has a history of hypertension hyperlipidemia diabetes smoking.  Denies history of MI denies family history of MI.  Denies hemoptysis denies unilateral lower extremity edema denies recent surgery immobilization or hospitalization denies testosterone use denies history of cancer.  The history is provided by the patient.  Chest Pain Pain location:  L chest Pain quality: aching   Pain radiates to:  Does not radiate Pain severity:  Mild Onset quality:  Gradual Duration:  12 months Timing:  Intermittent Progression:  Waxing and waning Chronicity:  New Relieved by:  Nothing Worsened by:  Nothing Ineffective treatments:  None tried Associated symptoms: no abdominal pain, no fever, no headache, no palpitations, no shortness of breath and no vomiting        Past Medical History:  Diagnosis Date  . 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 AKI (acute kidney injury) ([place]) 03/21/2015  . Arthritis Dx 2002  . Diabetes type 2, controlled ([place]) 2010  . Headache   . Hypertension Dx 2002  . 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 Schizo affective schizophrenia ([place]) 1995  . Schizophrenia ([place])   . Seizures ([place])   . Sleep apnea 1995   on CPAP    Patient Active Problem List   Diagnosis Date Noted  . Schizoaffective  disorder ([place]) 02/11/2020  . Schizophrenia ([place]) 02/10/2020  . Hep C w/o coma, chronic ([place]) 10/21/2015  . Injury of right Achilles tendon 10/17/2015  . Tinea pedis 10/17/2015  . Onychomycosis of toenail 10/17/2015  . 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 Alcohol use disorder, mild, abuse 09/18/2015  . Cannabis use disorder, mild, abuse 09/18/2015  . Opioid use disorder, moderate, dependence ([place]) 09/18/2015  . Tobacco use disorder 09/18/2015  . Cocaine use disorder, severe, dependence ([place]) 09/16/2015  . Schizoaffective disorder, depressive type ([place])   . Suicidal ideation 05/07/2015  . 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 Chronic pain syndrome 03/11/2015  . 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 Chronic headache 01/02/2015  . 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 HTN (hypertension) 10/21/2014  . Morbid obesity with BMI of 40.0-44.9, adult ([place]) 10/21/2014  . Sleep apnea   . 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[place])   . Arthritis   . Diabetes type 2, uncontrolled ([place])   . Adrenal insufficiency ([place]) 12/23/2005    Past Surgical History:  Procedure Laterality Date  . PITUITARY SURGERY  2005 & 2012  . SKIN BIOPSY         Family History  Problem Relation Age of Onset  . Hypertension Mother   . Diabetes Mother   . Heart Problems Mother   . Cancer Maternal Uncle   . Alcoholism Maternal Uncle   . Cancer Maternal Grandmother   .  Heart disease Neg Hx     Social History   Tobacco Use  . Smoking status: Current Every Day Smoker    Packs/day: 1.00    Years: 20.00    Pack years: 20.00    Types: Cigarettes  . Smokeless tobacco: Never Used  . Tobacco comment: Smoking .5 ppd  Substance Use Topics  . Alcohol use: Yes    Alcohol/week: 2.0 standard drinks    Types: 2 Cans of beer per week    Comment: 2 40s a day  . Drug use: Yes    Types: Cocaine, Marijuana, Heroin    Comment: Reports used 3 weeks ago. 10/17/2015    Home  Medications Prior to Admission medications   Medication Sig Start Date End Date Taking? Authorizing Provider  haloperidol decanoate (HALDOL DECANOATE) 50 MG/ML injection Inject 2 mLs (100 mg total) into the muscle every 30 (thirty) days. 03/13/20  Yes [place] Crane, MD  amLODipine (NORVASC) 5 MG tablet Take 1 tablet (5 mg total) by mouth daily. Patient not taking: Reported on 05/20/2020 02/12/20   Connye Burkitt, NP  atorvastatin (LIPITOR) 40 MG tablet Take 1 tablet (40 mg total) by mouth daily. Must have OV for refills Patient not taking: Reported on 05/20/2020 02/12/20   Connye Burkitt, NP  benztropine (COGENTIN) 0.5 MG tablet Take 1 tablet (0.5 mg total) by mouth 2 (two) times daily. Patient not taking: Reported on 05/20/2020 05/13/20   Salley Slaughter, NP  gabapentin (NEURONTIN) 600 MG tablet 1 pill twice per day and 2 at bedtime Patient not taking: Reported on 05/20/2020 03/13/20   [place] Crane, MD  glipiZIDE (GLUCOTROL XL) 10 MG 24 hr tablet Take 1 tablet (10 mg total) by mouth daily with breakfast. To lower blood sugar Patient not taking: Reported on 05/20/2020 02/12/20   Connye Burkitt, NP  hydrALAZINE (APRESOLINE) 25 MG tablet 1 in the morning and 1 with lunch Patient not taking: Reported on 05/20/2020 02/12/20   Connye Burkitt, NP  losartan-hydrochlorothiazide (HYZAAR) 100-25 MG tablet Take 1 tablet by mouth daily. To lower blood pressure Patient not taking: Reported on 05/20/2020 02/12/20   Connye Burkitt, NP  metFORMIN (GLUCOPHAGE) 1000 MG tablet Take 1 tablet (1,000 mg total) by mouth 2 (two) times daily with a meal. For diabetes management Patient not taking: Reported on 05/20/2020 02/12/20   Connye Burkitt, NP  pioglitazone (ACTOS) 30 MG tablet Take 1 tablet (30 mg total) by mouth daily. Patient not taking: Reported on 05/20/2020 02/12/20   Connye Burkitt, NP  predniSONE (DELTASONE) 5 MG tablet Take 1.5 tablets (7.5 mg total) by mouth daily with breakfast. Patient not taking: Reported on  05/20/2020 02/12/20   Connye Burkitt, NP    Allergies    Patient has no known allergies.  Review of Systems   Review of Systems  Constitutional: Negative for chills and fever.  HENT: Negative for congestion and facial swelling.   Eyes: Negative for discharge and visual disturbance.  Respiratory: Negative for shortness of breath.   Cardiovascular: Positive for chest pain. Negative for palpitations.  Gastrointestinal: Negative for abdominal pain, diarrhea and vomiting.  Musculoskeletal: Negative for arthralgias and myalgias.  Skin: Negative for color change and rash.  Neurological: Negative for tremors, syncope and headaches.  Psychiatric/Behavioral: Positive for sleep disturbance. Negative for confusion, dysphoric mood, hallucinations and suicidal ideas. The patient is nervous/anxious.     Physical Exam Updated Vital Signs BP (!) 154/111   Pulse 80   Temp  97.6 F (36.4 C) (Oral)   Resp 19   Ht 6' 3.5" (1.918 m)   Wt 127 kg   SpO2 100%   BMI 34.54 kg/m   Physical Exam Vitals and nursing note reviewed.  Constitutional:      Appearance: He is well-developed.  HENT:     Head: Normocephalic and atraumatic.  Eyes:     Pupils: Pupils are equal, round, and reactive to light.  Neck:     Vascular: No JVD.  Cardiovascular:     Rate and Rhythm: Normal rate and regular rhythm.     Heart sounds: No murmur heard.  No friction rub. No gallop.   Pulmonary:     Effort: No respiratory distress.     Breath sounds: No wheezing.  Abdominal:     General: There is no distension.     Tenderness: There is no abdominal tenderness. There is no guarding or rebound.  Musculoskeletal:        General: Normal range of motion.     Cervical back: Normal range of motion and neck supple.  Skin:    Coloration: Skin is not pale.     Findings: No rash.  Neurological:     Mental Status: He is alert and oriented to person, place, and time.  Psychiatric:        Behavior: Behavior normal.     ED  Results / Procedures / Treatments   Labs (all labs ordered are listed, but only abnormal results are displayed) Labs Reviewed  BASIC METABOLIC PANEL - Abnormal; Notable for the following components:      Result Value   Sodium 133 (*)    CO2 20 (*)    Glucose, Bld 126 (*)    Calcium 8.8 (*)    All other components within normal limits  CBC - Abnormal; Notable for the following components:   RBC 6.27 (*)    MCV 75.8 (*)    MCH 24.4 (*)    All other components within normal limits  SARS CORONAVIRUS 2 BY RT PCR [facility] ORDER, [place] LAB)  TROPONIN I (HIGH SENSITIVITY)    EKG EKG Interpretation  Date/Time:  Tuesday May 20 2020 05:31:41 EDT Ventricular Rate:  79 PR Interval:    QRS Duration: 100 QT Interval:  425 QTC Calculation: 488 R Axis:   7 Text Interpretation: Sinus rhythm Borderline prolonged QT interval No significant change since last tracing Confirmed by Deno Etienne [phone]) on 05/20/2020 5:35:29 AM   Radiology DG Chest Port 1 View  Result Date: 05/20/2020 CLINICAL DATA:  Chest pain. EXAM: PORTABLE CHEST 1 VIEW COMPARISON:  03/22/2015. FINDINGS: Mediastinum hilar structures normal. Low lung volumes with bibasilar atelectasis/infiltrates. No pleural effusion or pneumothorax. Heart size normal. IMPRESSION: Low lung volumes with bibasilar atelectasis/infiltrates. Electronically Signed   By: Marcello Moores  Register   On: 05/20/2020 05:45    Procedures Procedures (including critical care time) Discussed smoking cessation with patient and was they were offerred resources to help stop.  Total time was 5 min CPT code 99406.   Medications Ordered in ED Medications - No data to display  ED Course  I have reviewed the triage vital signs and the nursing notes.  Pertinent labs & imaging results that were available during my care of the patient were reviewed by me and considered in my medical decision making (see chart for details).    MDM  Rules/Calculators/A&P  44 yo M with a chief complaints of chest pain.  Atypical in nature.  Seems to be worse with his mental illness.  Will obtain an EKG a single troponin.  Chest pain work-up here is benign.  Medically clear.  TTS evaluation.  The patients results and plan were reviewed and discussed.   Any x-rays performed were independently reviewed by myself.   Differential diagnosis were considered with the presenting HPI.  Medications - No data to display  Vitals:   05/20/20 0522 05/20/20 0528  BP:  (!) 154/111  Pulse:  80  Resp:  19  Temp:  97.6 F (36.4 C)  TempSrc:  Oral  SpO2:  100%  Weight: 125.6 kg 127 kg  Height: 6\' 3"  (1.905 m) 6' 3.5" (1.918 m)    Final diagnoses:  Anxiety  Chest pain, unspecified type      Final Clinical Impression(s) / ED Diagnoses Final diagnoses:  Anxiety  Chest pain, unspecified type    Rx / DC Orders ED Discharge Orders    None       Deno Etienne, DO 05/20/20 0630

## 2020-05-20 NOTE — ED Triage Notes (Signed)
EMS called out for SOB, also wants to be seen by [facility], has been off his pysch meds for 3-4 days, complaining of some chest tightness also, arrives aox4 ambulatory, no distress noted

## 2020-05-20 NOTE — ED Notes (Signed)
Pt provided with dinner tray.

## 2020-05-21 ENCOUNTER — Encounter (HOSPITAL_COMMUNITY): Payer: Self-pay

## 2020-05-21 ENCOUNTER — Other Ambulatory Visit: Payer: Self-pay

## 2020-05-21 ENCOUNTER — Ambulatory Visit (HOSPITAL_COMMUNITY)
Admission: EM | Admit: 2020-05-21 | Discharge: 2020-05-21 | Disposition: A | Discharge status: Home / Self Care | Payer: No Payment, Other | Attending: Nurse Practitioner | Admitting: Nurse Practitioner

## 2020-05-21 DIAGNOSIS — Z7901 Long term (current) use of anticoagulants: Secondary | ICD-10-CM | POA: Insufficient documentation

## 2020-05-21 DIAGNOSIS — Z79899 Other long term (current) drug therapy: Secondary | ICD-10-CM | POA: Insufficient documentation

## 2020-05-21 DIAGNOSIS — R450 Nervousness: Secondary | ICD-10-CM | POA: Insufficient documentation

## 2020-05-21 DIAGNOSIS — F251 Schizoaffective disorder, depressive type: Secondary | ICD-10-CM | POA: Insufficient documentation

## 2020-05-21 DIAGNOSIS — F1910 Other psychoactive substance abuse, uncomplicated: Secondary | ICD-10-CM | POA: Insufficient documentation

## 2020-05-21 DIAGNOSIS — Z7984 Long term (current) use of oral hypoglycemic drugs: Secondary | ICD-10-CM | POA: Insufficient documentation

## 2020-05-21 DIAGNOSIS — F17210 Nicotine dependence, cigarettes, uncomplicated: Secondary | ICD-10-CM | POA: Insufficient documentation

## 2020-05-21 DIAGNOSIS — I10 Essential (primary) hypertension: Secondary | ICD-10-CM

## 2020-05-21 DIAGNOSIS — E1165 Type 2 diabetes mellitus with hyperglycemia: Secondary | ICD-10-CM

## 2020-05-21 DIAGNOSIS — F419 Anxiety disorder, unspecified: Secondary | ICD-10-CM | POA: Insufficient documentation

## 2020-05-21 LAB — GLUCOSE, CAPILLARY
Glucose-Capillary: 135 mg/dL — ABNORMAL HIGH (ref 70–99)
Glucose-Capillary: 73 mg/dL (ref 70–99)

## 2020-05-21 MED ORDER — BENZTROPINE MESYLATE 1 MG PO TABS
1.0000 mg | ORAL_TABLET | Freq: Two times a day (BID) | ORAL | 0 refills | Status: DC
Start: 2020-05-21 — End: 2020-06-25

## 2020-05-21 MED ORDER — LOSARTAN POTASSIUM-HCTZ 100-25 MG PO TABS: 1.0000 | ORAL_TABLET | Freq: Every day | ORAL | 0 refills | Status: DC

## 2020-05-21 MED ORDER — PIOGLITAZONE HCL 30 MG PO TABS: 30.0000 mg | ORAL_TABLET | Freq: Every day | ORAL | 0 refills | Status: DC

## 2020-05-21 MED ORDER — THIAMINE HCL 100 MG PO TABS: 100.0000 mg | ORAL_TABLET | Freq: Every day | ORAL | Status: DC

## 2020-05-21 MED ORDER — METFORMIN HCL 500 MG PO TABS
1000.0000 mg | ORAL_TABLET | Freq: Two times a day (BID) | ORAL | Status: DC
  Administered 2020-05-21: 1000 mg via ORAL
  Filled 2020-05-21: qty 2

## 2020-05-21 MED ORDER — LORAZEPAM 1 MG PO TABS: 1.0000 mg | ORAL_TABLET | Freq: Four times a day (QID) | ORAL | Status: DC | PRN

## 2020-05-21 MED ORDER — MAGNESIUM HYDROXIDE 400 MG/5ML PO SUSP: 30.0000 mL | Freq: Every day | ORAL | Status: DC | PRN

## 2020-05-21 MED ORDER — ACETAMINOPHEN 325 MG PO TABS: 650.0000 mg | ORAL_TABLET | Freq: Four times a day (QID) | ORAL | Status: DC | PRN

## 2020-05-21 MED ORDER — RISPERIDONE 1 MG PO TABS
1.0000 mg | ORAL_TABLET | Freq: Two times a day (BID) | ORAL | 0 refills | Status: DC
Start: 2020-05-21 — End: 2020-06-25

## 2020-05-21 MED ORDER — LORAZEPAM 1 MG PO TABS
1.0000 mg | ORAL_TABLET | Freq: Once | ORAL | Status: AC
  Administered 2020-05-21: 1 mg via ORAL
  Filled 2020-05-21: qty 1

## 2020-05-21 MED ORDER — ALUM & MAG HYDROXIDE-SIMETH 200-200-20 MG/5ML PO SUSP: 30.0000 mL | ORAL | Status: DC | PRN

## 2020-05-21 MED ORDER — METFORMIN HCL 1000 MG PO TABS
1000.0000 mg | ORAL_TABLET | Freq: Two times a day (BID) | ORAL | 0 refills | Status: DC
Start: 2020-05-21 — End: 2020-07-20

## 2020-05-21 MED ORDER — RISPERIDONE 1 MG PO TABS
1.0000 mg | ORAL_TABLET | Freq: Two times a day (BID) | ORAL | Status: DC
  Administered 2020-05-21: 1 mg via ORAL
  Filled 2020-05-21: qty 1

## 2020-05-21 MED ORDER — LOSARTAN POTASSIUM 50 MG PO TABS
100.0000 mg | ORAL_TABLET | Freq: Every day | ORAL | Status: DC
  Administered 2020-05-21: 100 mg via ORAL
  Filled 2020-05-21 (×2): qty 2

## 2020-05-21 MED ORDER — HYDROCHLOROTHIAZIDE 25 MG PO TABS
25.0000 mg | ORAL_TABLET | Freq: Every day | ORAL | Status: DC
  Administered 2020-05-21: 25 mg via ORAL
  Filled 2020-05-21 (×2): qty 1

## 2020-05-21 MED ORDER — AMLODIPINE BESYLATE 5 MG PO TABS
5.0000 mg | ORAL_TABLET | Freq: Every day | ORAL | Status: DC
  Administered 2020-05-21: 5 mg via ORAL
  Filled 2020-05-21 (×2): qty 1

## 2020-05-21 MED ORDER — BENZTROPINE MESYLATE 1 MG PO TABS
1.0000 mg | ORAL_TABLET | Freq: Once | ORAL | Status: AC
  Administered 2020-05-21: 1 mg via ORAL
  Filled 2020-05-21: qty 1

## 2020-05-21 MED ORDER — PIOGLITAZONE HCL 30 MG PO TABS
30.0000 mg | ORAL_TABLET | Freq: Every day | ORAL | Status: DC
  Administered 2020-05-21: 30 mg via ORAL
  Filled 2020-05-21 (×2): qty 1

## 2020-05-21 MED ORDER — ADULT MULTIVITAMIN W/MINERALS CH
1.0000 | ORAL_TABLET | Freq: Every day | ORAL | Status: DC
  Administered 2020-05-21: 1 via ORAL
  Filled 2020-05-21: qty 1

## 2020-05-21 MED ORDER — LOPERAMIDE HCL 2 MG PO CAPS: 2.0000 mg | ORAL_CAPSULE | ORAL | Status: DC | PRN

## 2020-05-21 MED ORDER — GLIPIZIDE ER 10 MG PO TB24
10.0000 mg | ORAL_TABLET | Freq: Every day | ORAL | 0 refills | Status: DC
Start: 2020-05-22 — End: 2020-06-20

## 2020-05-21 MED ORDER — ONDANSETRON 4 MG PO TBDP: 4.0000 mg | ORAL_TABLET | Freq: Four times a day (QID) | ORAL | Status: DC | PRN

## 2020-05-21 MED ORDER — HYDROXYZINE HCL 25 MG PO TABS: 25.0000 mg | ORAL_TABLET | Freq: Four times a day (QID) | ORAL | Status: DC | PRN

## 2020-05-21 MED ORDER — LOSARTAN POTASSIUM-HCTZ 100-25 MG PO TABS: 1.0000 | ORAL_TABLET | Freq: Every day | ORAL | Status: DC

## 2020-05-21 MED ORDER — GLIPIZIDE ER 2.5 MG PO TB24
10.0000 mg | ORAL_TABLET | Freq: Every day | ORAL | Status: DC
  Administered 2020-05-21: 10 mg via ORAL
  Filled 2020-05-21: qty 4

## 2020-05-21 MED ORDER — BENZTROPINE MESYLATE 1 MG PO TABS: 1.0000 mg | ORAL_TABLET | Freq: Two times a day (BID) | ORAL | Status: DC

## 2020-05-21 MED ORDER — BENZTROPINE MESYLATE 0.5 MG PO TABS: 0.5000 mg | ORAL_TABLET | Freq: Two times a day (BID) | ORAL | Status: DC

## 2020-05-21 MED ORDER — BENZTROPINE MESYLATE 0.5 MG PO TABS
0.5000 mg | ORAL_TABLET | Freq: Two times a day (BID) | ORAL | Status: DC
Start: 2020-05-21 — End: 2020-05-21

## 2020-05-21 MED ORDER — AMLODIPINE BESYLATE 5 MG PO TABS: 5.0000 mg | ORAL_TABLET | Freq: Every day | ORAL | 0 refills | Status: DC

## 2020-05-21 NOTE — ED Triage Notes (Signed)
Pt arrives via [employer] from [place] not working and having bad anxiety & shakiness. A&Ox4, calm & cooperative through initial nursing assessment. Denies SI/HI/AVH.

## 2020-05-21 NOTE — ED Provider Notes (Signed)
FBC/OBS ASAP Discharge Summary  Date and Time: 05/21/2020 11:10 AM  Name: Duane Salazar  MRN:  [id number]   Discharge Diagnoses:  Final diagnoses:  Schizoaffective disorder, depressive type [facility])    Stay Summary: From admission H&P: Duane Salazar is a 44 y.o. male with a history of schizoaffective disorder who presents voluntarily to [facility] from [facility]. Patient states "I am antsy. I can't sit still. I feel jittery inside." States that he does not feel like cogentin is working. He denies suicidal ideations. Denies homicidal ideations. Denies auditory and visual hallucinations. Patient reports that he has not been taking any of his other medications. Blood pressure is elevated. Patient did not receive any medications in the emergency department.  Duane Salazar presented to [facility] for reports of anxiety and being off medications for several days. He reported feeling anxious and like he could not stop moving his legs. No visible signs of akithisia on assessment. Per chart review he was started on Cogentin 0.5 mg BID two weeks ago by outpatient provider. He reports still feeling restless and states that he has been taking double the Cogentin that was prescribed. He reports feeling restless since starting Haldol Decanoate. Per chart review he has been on this medication for months. He received last injection on 04/25/20. He denies any AVH and shows no signs of responding to internal stimuli. No visible signs of paranoia. No delusional thought content expressed. He denies any SI/HI. We discussed initiating Risperdal 1 mg PO BID, with plan to follow up outpatient for possible switch to Risperdal [place] or [place]. Patient provided with Cogentin rx but advised that it would take more time for Haldol Decanoate side effects to resolve while still in his system, and he states understanding. Patient also reports alcohol, cocaine, and marijuana use, with last use three days ago. UDS and BAL were not drawn on admission.  Patient advised substance use likely contributing to restlessness. He denies withdrawal symptoms at this time. He is requesting referrals for rehab. CSW informed of patient request for rehab referrals. Patient requested to leave before referrals were made, and states he prefers to follow up with provided rehab resources on his own.  Total Time spent with patient: 30 minutes  Past Psychiatric History: History of schizophrenia, polysubstance abuse Past Medical History:  Past Medical History:  Diagnosis Date  . 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[place]) 2004  . AKI (acute kidney injury) ([place]) 03/21/2015  . Arthritis Dx 2002  . Diabetes type 2, controlled ([place]) 2010  . Headache   . Hypertension Dx 2002  . 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 Schizo affective schizophrenia ([place]) 1995  . Schizophrenia ([place])   . Seizures ([place])   . Sleep apnea 1995   on CPAP    Past Surgical History:  Procedure Laterality Date  . PITUITARY SURGERY  2005 & 2012  . SKIN BIOPSY     Family History:  Family History  Problem Relation Age of Onset  . Hypertension Mother   . Diabetes Mother   . Heart Problems Mother   . Cancer Maternal Uncle   . Alcoholism Maternal Uncle   . Cancer Maternal Grandmother   . Heart disease Neg Hx    Family Psychiatric History: Uncle with alcohol use disorder Social History:  Social History   Substance and Sexual Activity  Alcohol Use Yes  . Alcohol/week: 2.0 standard drinks  . Types: 2 Cans of beer per week   Comment: 2 40s a day     Social History   Substance  and Sexual Activity  Drug Use Yes  . Types: Cocaine, Marijuana, Heroin   Comment: Reports used 3 weeks ago. 10/17/2015    Social History   Socioeconomic History  . Marital status: Single    Spouse name: Not on file  . Number of children: 2  . Years of education: GED  . Highest education level: Not on file  Occupational History  . Not on file  Tobacco Use  . Smoking status: Current Every Day Smoker    Packs/day: 1.00     Years: 20.00    Pack years: 20.00    Types: Cigarettes  . Smokeless tobacco: Never Used  . Tobacco comment: Smoking .5 ppd  Substance and Sexual Activity  . Alcohol use: Yes    Alcohol/week: 2.0 standard drinks    Types: 2 Cans of beer per week    Comment: 2 40s a day  . Drug use: Yes    Types: Cocaine, Marijuana, Heroin    Comment: Reports used 3 weeks ago. 10/17/2015  . Sexual activity: Yes  Other Topics Concern  . Not on file  Social History Narrative   Lives with mom.   Incarcerated for 22 months in [place], [place]. From 2014-09/2014   Social Determinants of Health   Financial Resource Strain:   . Difficulty of Paying Living Expenses: Not on file  Food Insecurity:   . Worried About Charity fundraiser in the Last Year: Not on file  . Ran Out of Food in the Last Year: Not on file  Transportation Needs:   . Lack of Transportation (Medical): Not on file  . Lack of Transportation (Non-Medical): Not on file  Physical Activity:   . Days of Exercise per Week: Not on file  . Minutes of Exercise per Session: Not on file  Stress:   . Feeling of Stress : Not on file  Social Connections:   . Frequency of Communication with Friends and Family: Not on file  . Frequency of Social Gatherings with Friends and Family: Not on file  . Attends Religious Services: Not on file  . Active Member of Clubs or Organizations: Not on file  . Attends Archivist Meetings: Not on file  . Marital Status: Not on file   SDOH:  SDOH Screenings   Alcohol Screen: Low Risk   . Last Alcohol Screening Score (AUDIT): 3  Depression (PHQ2-9): Medium Risk  . PHQ-2 Score: 8  Financial Resource Strain:   . Difficulty of Paying Living Expenses: Not on file  Food Insecurity:   . Worried About Charity fundraiser in the Last Year: Not on file  . Ran Out of Food in the Last Year: Not on file  Housing:   . Last Housing Risk Score: Not on file  Physical Activity:   . Days of Exercise per Week: Not on  file  . Minutes of Exercise per Session: Not on file  Social Connections:   . Frequency of Communication with Friends and Family: Not on file  . Frequency of Social Gatherings with Friends and Family: Not on file  . Attends Religious Services: Not on file  . Active Member of Clubs or Organizations: Not on file  . Attends Archivist Meetings: Not on file  . Marital Status: Not on file  Stress:   . Feeling of Stress : Not on file  Tobacco Use: High Risk  . Smoking Tobacco Use: Current Every Day Smoker  . Smokeless Tobacco Use: Never Used  Transportation Needs:   . Film/video editor (Medical): Not on file  . Lack of Transportation (Non-Medical): Not on file    Has this patient used any form of tobacco in the last 30 days? (Cigarettes, Smokeless Tobacco, Cigars, and/or Pipes) No Current Medications:  Current Facility-Administered Medications  Medication Dose Route Frequency Provider Last Rate Last Admin  . acetaminophen (TYLENOL) tablet 650 mg  650 mg Oral Q6H PRN Lindon Romp A, NP      . alum & mag hydroxide-simeth (MAALOX/MYLANTA) 200-200-20 MG/5ML suspension 30 mL  30 mL Oral Q4H PRN Lindon Romp A, NP      . amLODipine (NORVASC) tablet 5 mg  5 mg Oral Daily Lindon Romp A, NP   5 mg at 05/21/20 0505  . benztropine (COGENTIN) tablet 1 mg  1 mg Oral BID Lindon Romp A, NP      . glipiZIDE (GLUCOTROL XL) 24 hr tablet 10 mg  10 mg Oral Q breakfast Lindon Romp A, NP   10 mg at 05/21/20 0756  . haloperidol decanoate (HALDOL DECANOATE) 100 MG/ML injection 100 mg  100 mg Intramuscular Q30 days [place] Crane, MD   100 mg at 04/25/20 1146  . losartan (COZAAR) tablet 100 mg  100 mg Oral Daily Lindon Romp A, NP   100 mg at 05/21/20 0505   And  . hydrochlorothiazide (HYDRODIURIL) tablet 25 mg  25 mg Oral Daily Lindon Romp A, NP   25 mg at 05/21/20 0505  . hydrOXYzine (ATARAX/VISTARIL) tablet 25 mg  25 mg Oral Q6H PRN Connye Burkitt, NP      . loperamide (IMODIUM) capsule 2-4 mg   2-4 mg Oral PRN Connye Burkitt, NP      . LORazepam (ATIVAN) tablet 1 mg  1 mg Oral Once Connye Burkitt, NP      . LORazepam (ATIVAN) tablet 1 mg  1 mg Oral Q6H PRN Connye Burkitt, NP      . magnesium hydroxide (MILK OF MAGNESIA) suspension 30 mL  30 mL Oral Daily PRN Lindon Romp A, NP      . metFORMIN (GLUCOPHAGE) tablet 1,000 mg  1,000 mg Oral BID WC Lindon Romp A, NP   1,000 mg at 05/21/20 0757  . multivitamin with minerals tablet 1 tablet  1 tablet Oral Daily Connye Burkitt, NP      . ondansetron (ZOFRAN-ODT) disintegrating tablet 4 mg  4 mg Oral Q6H PRN Connye Burkitt, NP      . pioglitazone (ACTOS) tablet 30 mg  30 mg Oral Daily Rozetta Nunnery, NP      . Derrill Memo ON 05/22/2020] thiamine tablet 100 mg  100 mg Oral Daily Connye Burkitt, NP       Current Outpatient Medications  Medication Sig Dispense Refill  . amLODipine (NORVASC) 5 MG tablet Take 1 tablet (5 mg total) by mouth daily. (Patient not taking: Reported on 05/20/2020) 30 tablet 0  . atorvastatin (LIPITOR) 40 MG tablet Take 1 tablet (40 mg total) by mouth daily. Must have OV for refills (Patient not taking: Reported on 05/20/2020) 30 tablet 0  . benztropine (COGENTIN) 0.5 MG tablet Take 1 tablet (0.5 mg total) by mouth 2 (two) times daily. (Patient not taking: Reported on 05/20/2020) 60 tablet 2  . gabapentin (NEURONTIN) 600 MG tablet 1 pill twice per day and 2 at bedtime (Patient not taking: Reported on 05/20/2020) 120 tablet 1  . glipiZIDE (GLUCOTROL XL) 10 MG 24 hr tablet Take 1 tablet (  10 mg total) by mouth daily with breakfast. To lower blood sugar (Patient not taking: Reported on 05/20/2020) 30 tablet 0  . haloperidol decanoate (HALDOL DECANOATE) 50 MG/ML injection Inject 2 mLs (100 mg total) into the muscle every 30 (thirty) days. (Patient not taking: Reported on 05/21/2020) 2 mL 11  . hydrALAZINE (APRESOLINE) 25 MG tablet 1 in the morning and 1 with lunch (Patient not taking: Reported on 05/20/2020) 60 tablet 0  .  losartan-hydrochlorothiazide (HYZAAR) 100-25 MG tablet Take 1 tablet by mouth daily. To lower blood pressure (Patient not taking: Reported on 05/20/2020) 30 tablet 0  . metFORMIN (GLUCOPHAGE) 1000 MG tablet Take 1 tablet (1,000 mg total) by mouth 2 (two) times daily with a meal. For diabetes management (Patient not taking: Reported on 05/20/2020) 60 tablet 0  . pioglitazone (ACTOS) 30 MG tablet Take 1 tablet (30 mg total) by mouth daily. (Patient not taking: Reported on 05/20/2020) 30 tablet 0  . predniSONE (DELTASONE) 5 MG tablet Take 1.5 tablets (7.5 mg total) by mouth daily with breakfast. (Patient not taking: Reported on 05/20/2020) 45 tablet 0    PTA Medications: (Not in a hospital admission)   Musculoskeletal  Strength & Muscle Tone: within normal limits Gait & Station: normal Patient leans: N/A  Psychiatric Specialty Exam  Presentation  General Appearance: Fairly Groomed  Eye Contact:Fair  Speech:Clear and Coherent;Normal Rate  Speech Volume:Normal  Handedness:Right   Mood and Affect  Mood:Anxious  Affect:Congruent   Thought Process  Thought Processes:Coherent;Goal Directed  Descriptions of Associations:Intact  Orientation:Full (Time, Place and Person)  Thought Content:Logical  Hallucinations:Hallucinations: None  Ideas of Reference:None  Suicidal Thoughts:Suicidal Thoughts: No  Homicidal Thoughts:Homicidal Thoughts: No   Sensorium  Memory:Immediate Good;Recent Good;Remote Good  Judgment:Fair  Insight:Fair   Executive Functions  Concentration:Fair  Attention Span:Fair  [place]it   Psychomotor Activity  Psychomotor Activity:Psychomotor Activity: Restlessness   Assets  Assets:Communication Skills;Desire for Improvement;Leisure Time   Sleep  Sleep:Sleep: Fair   Physical Exam  Physical Exam Vitals and nursing note reviewed.  Constitutional:      Appearance: He is well-developed.   Cardiovascular:     Rate and Rhythm: Normal rate.  Pulmonary:     Effort: Pulmonary effort is normal.  Neurological:     Mental Status: He is alert and oriented to person, place, and time.    Review of Systems  Constitutional: Negative.   Respiratory: Negative for cough and shortness of breath.   Cardiovascular: Negative for chest pain.  Gastrointestinal: Negative for diarrhea, nausea and vomiting.  Neurological: Negative for tremors, seizures, weakness and headaches.  Psychiatric/Behavioral: Positive for substance abuse. Negative for depression, hallucinations and suicidal ideas. The patient is nervous/anxious. The patient does not have insomnia.    Blood pressure (!) 159/102, pulse 97, temperature 98.5 F (36.9 C), temperature source Oral, resp. rate 18, SpO2 100 %. There is no height or weight on file to calculate BMI.  Demographic Factors:  Male and Low socioeconomic status  Loss Factors: NA  Historical Factors: NA  Risk Reduction Factors:   Living with another person, especially a relative and Positive therapeutic relationship  Continued Clinical Symptoms:  Alcohol/Substance Abuse/Dependencies  Cognitive Features That Contribute To Risk:  None    Suicide Risk:  Minimal: No identifiable suicidal ideation.  Patients presenting with no risk factors but with morbid ruminations; may be classified as minimal risk based on the severity of the depressive symptoms  Plan Of Care/Follow-up recommendations: Activity as tolerated. Diet  as recommended by primary care physician. Keep all scheduled follow-up appointments as recommended.  Disposition: Patient shows no evidence of acute risk of harm to self or others and is psych cleared for discharge, to follow up with established outpatient provider at [facility] and substance use referrals.  Connye Burkitt, NP 05/21/2020, 11:10 AM

## 2020-05-21 NOTE — ED Notes (Signed)
Received call from patient placement. Patient is up for TTS. Machine placed at bedside.

## 2020-05-21 NOTE — ED Notes (Signed)
Report given to [facility] and safe transport called for transportation there

## 2020-05-21 NOTE — ED Notes (Signed)
Pt asleep with even and unlabored respirations. No distress or discomfort noted. Pt remains safe on the unit. Will continue to monitor. 

## 2020-05-21 NOTE — Discharge Instructions (Signed)
Activity as tolerated. Diet as recommended by primary care physician. Keep all scheduled follow-up appointments as recommended.

## 2020-05-21 NOTE — ED Notes (Addendum)
Pt alert and oriented on the unit. Education, support, and encouragement provided. Discharge summary, medications and follow up appointments reviewed with pt. Suicide prevention resources provided. Pt's belongings in locker returned and belongings sheet signed. Pt denies SI/HI, A/VH, pain, or any concerns at this time. Pt ambulatory on and off unit. Pt discharged to lobby.

## 2020-05-21 NOTE — ED Notes (Signed)
Pt belongings in locker #6 

## 2020-05-21 NOTE — BH Assessment (Signed)
Tele Assessment Note   Patient Name: Duane Salazar MRN: [id number] Referring Physician: Deno Etienne Location of Patient: [facility] Location of Provider: [place]  Duane Salazar is an 44 y.o. male. Per EDP report, " with a chief complaints of chest pain.  This been going on for months.  Usually worse when his anxiety worsens.  Has been having issues with his anxiety as well.  Just feels like his head is not right.  Typically gets in injection medicine for his schizophrenia.  States that he feels like it is not working right".  During assessment pt was anxious but cooperative. Pt stated that his mother brought him to the hospital due to anxiety, chest pain and he states he was having some suicidal thoughts yesterday to jump off a bridge on [address] but he states he had no desire to do so just the thoughts, he reports no SI currently just passive HI towards people in the community. Pt reports he has been off his medications and is due for an injections soon, states he has been unusully anxious, jittery and irritable towards others and wants to "jump on people". Pt does admit to using drugs yesterday, powder cocaine, marijuana and alcohol). States he used a gram of cocaine and smokes marijuana daily and had a few beers. Pt currently denies AVH and self harm at this time. Pt reports only getting 2 to 3 hours of sleep daily, he states that a few days ago he did not sleep for 3 days. Pt reports poor appetite as well as feeling more depressed. Pt reports he is currently staying with friends and family, unemployed as well and was just released from being incarcerated in January earlier this year. Pt seeking treatment at this time and states he needs his medications so that he can be more stabilized at this time he has Haldol injection due with his provider Dr [facility] on 9/27 and he takes Cogentin. Pt denies access to weapoms at this time does have a criminal hx states he is trying to stay out of  trouble  Seek treatment at this time.  Disposition: Lindon Romp, FNP recommends pt for overnight observation/continual [facility] at [facility]. Diagnosis: Schizoaffective Disorder  Past Medical History:  Past Medical History:  Diagnosis Date  . 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 AKI (acute kidney injury) ([place]) 03/21/2015  . Arthritis Dx 2002  . Diabetes type 2, controlled ([place]) 2010  . Headache   . Hypertension Dx 2002  . 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 Schizo affective schizophrenia ([place]) 1995  . Schizophrenia ([place])   . Seizures ([place])   . Sleep apnea 1995   on CPAP    Past Surgical History:  Procedure Laterality Date  . PITUITARY SURGERY  2005 & 2012  . SKIN BIOPSY      Family History:  Family History  Problem Relation Age of Onset  . Hypertension Mother   . Diabetes Mother   . Heart Problems Mother   . Cancer Maternal Uncle   . Alcoholism Maternal Uncle   . Cancer Maternal Grandmother   . Heart disease Neg Hx     Social History:  reports that he has been smoking cigarettes. He has a 20.00 pack-year smoking history. He has never used smokeless tobacco. He reports current alcohol use of about 2.0 standard drinks of alcohol per week. He reports current drug use. Drugs: Cocaine, Marijuana, and Heroin.  Additional Social History:  Alcohol / Drug Use Pain Medications: see MAR History of  alcohol / drug use?: Yes Substance #1 Name of Substance 1:  (marijuana) Substance #2 Name of Substance 2: cocaine  CIWA: CIWA-Ar BP: (!) 159/112 Pulse Rate: 75 COWS:    Allergies: No Known Allergies  Home Medications: (Not in a hospital admission)   OB/GYN Status:  No LMP for male patient.  General Assessment Data [facility] TTS Assessment: In system Is this a Tele or Face-to-Face Assessment?: Tele Assessment Is this an Initial Assessment or a Re-assessment for this encounter?: Initial Assessment Patient Accompanied by:: N/A Language Other than English:  No Living Arrangements: Homeless/Shelter What gender do you identify as?: Male Date Telepsych consult ordered in [facility]: 05/20/20 Marital status: Single Pregnancy Status: No Living Arrangements: Alone Can pt return to current living arrangement?: Yes Admission Status: Voluntary Is patient capable of signing voluntary admission?: Yes Referral Source: Self/Family/Friend Insurance type: None     Crisis Care Plan Living Arrangements: Alone Legal Guardian:  (self) Name of Psychiatrist: Dr [facility] Name of Therapist: none  Education Status Is patient currently in school?: No Is the patient employed, unemployed or receiving disability?: Unemployed  Risk to self with the past 6 months Suicidal Ideation: No-Not Currently/Within Last 6 Months Has patient been a risk to self within the past 6 months prior to admission? : Yes Suicidal Intent: No Has patient had any suicidal intent within the past 6 months prior to admission? : No Is patient at risk for suicide?: Yes Suicidal Plan?: No Has patient had any suicidal plan within the past 6 months prior to admission? : No Access to Means: Yes Specify Access to Suicidal Means: accesss to bridge What has been your use of drugs/alcohol within the last 12 months?: cocaine, marijuana Previous Attempts/Gestures: Yes How many times?: 1 Other Self Harm Risks: none Triggers for Past Attempts: Unknown Intentional Self Injurious Behavior: None Family Suicide History: Unknown Recent stressful life event(s): Other (Comment), Financial Problems, Trauma (Comment) Persecutory voices/beliefs?: No Depression: Yes Depression Symptoms: Feeling angry/irritable, Insomnia, Isolating Substance abuse history and/or treatment for substance abuse?: Yes Suicide prevention information given to non-admitted patients: Not applicable  Risk to Others within the past 6 months Homicidal Ideation: No Does patient have any lifetime risk of violence toward others  beyond the six months prior to admission? : Unknown Thoughts of Harm to Others: Yes-Currently Present Comment - Thoughts of Harm to Others: jumping on people Current Homicidal Intent: No-Not Currently/Within Last 6 Months Current Homicidal Plan: No Access to Homicidal Means: Yes Describe Access to Homicidal Means: access to people in community Identified Victim: none History of harm to others?: Yes Assessment of Violence: None Noted Violent Behavior Description:  (unknown) Does patient have access to weapons?: No Criminal Charges Pending?: No Does patient have a court date: No Is patient on probation?: No  Psychosis Hallucinations: None noted Delusions: None noted  Mental Status Report Appearance/Hygiene: Disheveled Eye Contact: Fair Motor Activity: Freedom of movement Speech: Logical/coherent Level of Consciousness: Alert, Restless Mood: Depressed, Anxious Affect: Appropriate to circumstance Anxiety Level: Moderate Thought Processes: Coherent Judgement: Partial Orientation: Person, Place, Time, Situation Obsessive Compulsive Thoughts/Behaviors: None  Cognitive Functioning Concentration: Good Memory: Recent Intact Is patient IDD: No Insight: Good Impulse Control: Fair Appetite: Poor Have you had any weight changes? : No Change Sleep: Decreased Total Hours of Sleep:  (2 to 3 hours) Vegetative Symptoms: None  ADLScreening [facility] Assessment Services) Patient's cognitive ability adequate to safely complete daily activities?: Yes Patient able to express need for assistance with ADLs?:  Yes Independently performs ADLs?: Yes (appropriate for developmental age)  Prior Inpatient Therapy Prior Inpatient Therapy: Yes Prior Therapy Dates: 2021 Prior Therapy Facilty/Provider(s): [facility] Reason for Treatment: schizoaffective disorder  Prior Outpatient Therapy Prior Outpatient Therapy: No Does patient have an ACCT team?: No Does patient have Intensive In-House Services?  :  No Does patient have [name] services? : No Does patient have P4CC services?: No  ADL Screening (condition at time of admission) Patient's cognitive ability adequate to safely complete daily activities?: Yes Is the patient deaf or have difficulty hearing?: No Does the patient have difficulty seeing, even when wearing glasses/contacts?: No Does the patient have difficulty concentrating, remembering, or making decisions?: No Patient able to express need for assistance with ADLs?: Yes Does the patient have difficulty dressing or bathing?: No Independently performs ADLs?: Yes (appropriate for developmental age) Does the patient have difficulty walking or climbing stairs?: No Weakness of Legs: None Weakness of Arms/Hands: None  Home Assistive Devices/Equipment Home Assistive Devices/Equipment: None                      Disposition Initial Assessment Completed for this Encounter: Yes  This service was provided via telemedicine using a 2-way, interactive audio and video technology.  Names of all persons participating in this telemedicine service and their role in this encounter. Name:  Duane Salazar Role: Patient  Name: Antony Contras Role: TTS  Name:  Role:   Name:  Role:     Donato Heinz 05/21/2020 3:41 AM

## 2020-05-21 NOTE — ED Provider Notes (Signed)
Behavioral Health Admission H&P [facility] & OBS)  Date: 05/21/20 Patient Name: Duane Salazar MRN: [id number] Chief Complaint:  Chief Complaint  Patient presents with  . Anxiety      Diagnoses:  Final diagnoses:  Schizoaffective disorder, depressive type ([place])    HPI: Duane Salazar is a 44 y.o. male with a history of schizoaffective disorder who presents voluntarily to [facility] from [facility]. Patient states "I am antsy. I can't sit still. I feel jittery inside." States that he does not feel like cogentin is working. He denies suicidal ideations. Denies homicidal ideations. Denies auditory and visual hallucinations. Patient reports that he has not been taking any of his other medications. Blood pressure is elevated. Patient did not receive any medications in the emergency department.  PHQ 2-9:    Office Visit from 11/23/2019 in [place] Office Visit from 10/17/2015 in [place]  Thoughts that you would be better off dead, or of hurting yourself in some way Not at all Several days  PHQ-9 Total Score 8 23        Admission (Discharged) from OP Visit from 02/10/2020 in [place] [facility]  C-SSRS RISK CATEGORY High Risk       Total Time spent with patient: 15 minutes  Musculoskeletal  Strength & Muscle Tone: within normal limits Gait & Station: normal Patient leans: N/A  Psychiatric Specialty Exam  Presentation General Appearance: Fairly Groomed  Eye Contact:Fair  Speech:Clear and Coherent;Normal Rate  Speech Volume:Normal  Handedness:Right   Mood and Affect  Mood:Anxious  Affect:Congruent;Appropriate   Thought Process  Thought Processes:Coherent  Descriptions of Associations:Intact  Orientation:Full (Time, Place and Person)  Thought Content:WDL  Hallucinations:Hallucinations: None  Ideas of Reference:None  Suicidal Thoughts:Suicidal Thoughts: No  Homicidal Thoughts:Homicidal  Thoughts: No   Sensorium  Memory:Immediate Good;Recent Good;Remote Good  Judgment:Fair  Insight:Fair   Executive Functions  Concentration:Fair  Attention Span:Fair  [place]  Language:Good   Psychomotor Activity  Psychomotor Activity:Psychomotor Activity: Restlessness   Assets  Assets:Communication Skills;Desire for Improvement   Sleep  Sleep:Sleep: Fair   Physical Exam Constitutional:      General: He is not in acute distress.    Appearance: He is not ill-appearing, toxic-appearing or diaphoretic.  HENT:     Head: Normocephalic.     Right Ear: External ear normal.     Left Ear: External ear normal.  Eyes:     Pupils: Pupils are equal, round, and reactive to light.  Cardiovascular:     Rate and Rhythm: Normal rate.  Pulmonary:     Effort: Pulmonary effort is normal. No respiratory distress.  Musculoskeletal:        General: Normal range of motion.  Skin:    General: Skin is warm and dry.  Neurological:     Mental Status: He is alert and oriented to person, place, and time.  Psychiatric:        Mood and Affect: Mood is depressed.        Speech: Speech normal.        Behavior: Behavior is cooperative.        Thought Content: Thought content is not paranoid or delusional. Thought content does not include homicidal or suicidal ideation. Thought content does not include suicidal plan.    Review of Systems  Constitutional: Negative for chills, diaphoresis, fever, malaise/fatigue and weight loss.  HENT: Negative for congestion.   Respiratory: Negative for cough and shortness of breath.  Cardiovascular: Negative for chest pain and palpitations.  Gastrointestinal: Negative for diarrhea, nausea and vomiting.  Neurological: Negative for dizziness and seizures.  Psychiatric/Behavioral: Positive for depression. Negative for hallucinations, memory loss, substance abuse and suicidal ideas. The patient is nervous/anxious and has  insomnia.   All other systems reviewed and are negative.   Blood pressure (!) 140/103, pulse 83, temperature 97.6 F (36.4 C), temperature source Oral, resp. rate 20, SpO2 100 %. There is no height or weight on file to calculate BMI.  Past Psychiatric History: Schizoaffective Disorder, Depressive type  Is the patient at risk to self? No  Has the patient been a risk to self in the past 6 months? No .    Has the patient been a risk to self within the distant past? Yes   Is the patient a risk to others? No   Has the patient been a risk to others in the past 6 months? No   Has the patient been a risk to others within the distant past? No   Past Medical History:  Past Medical History:  Diagnosis Date  . 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 AKI (acute kidney injury) ([place]) 03/21/2015  . Arthritis Dx 2002  . Diabetes type 2, controlled ([place]) 2010  . Headache   . Hypertension Dx 2002  . 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 Schizo affective schizophrenia ([place]) 1995  . Schizophrenia ([place])   . Seizures ([place])   . Sleep apnea 1995   on CPAP    Past Surgical History:  Procedure Laterality Date  . PITUITARY SURGERY  2005 & 2012  . SKIN BIOPSY      Family History:  Family History  Problem Relation Age of Onset  . Hypertension Mother   . Diabetes Mother   . Heart Problems Mother   . Cancer Maternal Uncle   . Alcoholism Maternal Uncle   . Cancer Maternal Grandmother   . Heart disease Neg Hx     Social History:  Social History   Socioeconomic History  . Marital status: Single    Spouse name: Not on file  . Number of children: 2  . Years of education: GED  . Highest education level: Not on file  Occupational History  . Not on file  Tobacco Use  . Smoking status: Current Every Day Smoker    Packs/day: 1.00    Years: 20.00    Pack years: 20.00    Types: Cigarettes  . Smokeless tobacco: Never Used  . Tobacco comment: Smoking .5 ppd  Substance and Sexual Activity  . Alcohol use: Yes     Alcohol/week: 2.0 standard drinks    Types: 2 Cans of beer per week    Comment: 2 40s a day  . Drug use: Yes    Types: Cocaine, Marijuana, Heroin    Comment: Reports used 3 weeks ago. 10/17/2015  . Sexual activity: Yes  Other Topics Concern  . Not on file  Social History Narrative   Lives with mom.   Incarcerated for 22 months in [place], [place]. From 2014-09/2014   Social Determinants of Health   Financial Resource Strain:   . Difficulty of Paying Living Expenses: Not on file  Food Insecurity:   . Worried About Charity fundraiser in the Last Year: Not on file  . Ran Out of Food in the Last Year: Not on file  Transportation Needs:   . Lack of Transportation (Medical): Not on file  . Lack of Transportation (Non-Medical): Not on  file  Physical Activity:   . Days of Exercise per Week: Not on file  . Minutes of Exercise per Session: Not on file  Stress:   . Feeling of Stress : Not on file  Social Connections:   . Frequency of Communication with Friends and Family: Not on file  . Frequency of Social Gatherings with Friends and Family: Not on file  . Attends Religious Services: Not on file  . Active Member of Clubs or Organizations: Not on file  . Attends Archivist Meetings: Not on file  . Marital Status: Not on file  Intimate Partner Violence:   . Fear of Current or Ex-Partner: Not on file  . Emotionally Abused: Not on file  . Physically Abused: Not on file  . Sexually Abused: Not on file    SDOH:  SDOH Screenings   Alcohol Screen: Low Risk   . Last Alcohol Screening Score (AUDIT): 3  Depression (PHQ2-9): Medium Risk  . PHQ-2 Score: 8  Financial Resource Strain:   . Difficulty of Paying Living Expenses: Not on file  Food Insecurity:   . Worried About Charity fundraiser in the Last Year: Not on file  . Ran Out of Food in the Last Year: Not on file  Housing:   . Last Housing Risk Score: Not on file  Physical Activity:   . Days of Exercise per Week: Not  on file  . Minutes of Exercise per Session: Not on file  Social Connections:   . Frequency of Communication with Friends and Family: Not on file  . Frequency of Social Gatherings with Friends and Family: Not on file  . Attends Religious Services: Not on file  . Active Member of Clubs or Organizations: Not on file  . Attends Archivist Meetings: Not on file  . Marital Status: Not on file  Stress:   . Feeling of Stress : Not on file  Tobacco Use: High Risk  . Smoking Tobacco Use: Current Every Day Smoker  . Smokeless Tobacco Use: Never Used  Transportation Needs:   . Film/video editor (Medical): Not on file  . Lack of Transportation (Non-Medical): Not on file    Last Labs:  Admission on 05/20/2020, Discharged on 05/21/2020  Component Date Value Ref Range Status  . Sodium 05/20/2020 133* 135 - 145 mmol/L Final  . Potassium 05/20/2020 3.7  3.5 - 5.1 mmol/L Final  . Chloride 05/20/2020 102  98 - 111 mmol/L Final  . CO2 05/20/2020 20* 22 - 32 mmol/L Final  . Glucose, Bld 05/20/2020 126* 70 - 99 mg/dL Final   Glucose reference range applies only to samples taken after fasting for at least 8 hours.  . BUN 05/20/2020 11  6 - 20 mg/dL Final  . Creatinine, Ser 05/20/2020 0.62  0.61 - 1.24 mg/dL Final  . Calcium 05/20/2020 8.8* 8.9 - 10.3 mg/dL Final  . GFR calc non Af Amer 05/20/2020 >60  >60 mL/min Final  . GFR calc Af Amer 05/20/2020 >60  >60 mL/min Final  . Anion gap 05/20/2020 11  5 - 15 Final   Performed at [facility], [place] [address] [place], [place] [address]  . WBC 05/20/2020 9.0  4.0 - 10.5 K/uL Final  . RBC 05/20/2020 6.27* 4.22 - 5.81 MIL/uL Final  . Hemoglobin 05/20/2020 15.3  13.0 - 17.0 g/dL Final  . HCT 05/20/2020 47.5  39 - 52 % Final  . MCV 05/20/2020 75.8* 80.0 - 100.0 fL Final  .  MCH 05/20/2020 24.4* 26.0 - 34.0 pg Final  . MCHC 05/20/2020 32.2  30.0 - 36.0 g/dL Final  . RDW 05/20/2020 15.1  11.5 - 15.5 % Final  . Platelets  05/20/2020 243  150 - 400 K/uL Final  . nRBC 05/20/2020 0.0  0.0 - 0.2 % Final   Performed at [facility], [place] [address] [place], [place] [address]  . Troponin I (High Sensitivity) 05/20/2020 6  <18 ng/L Final   Comment: (NOTE) Elevated high sensitivity troponin I (hsTnI) values and significant  changes across serial measurements may suggest ACS but many other  chronic and acute conditions are known to elevate hsTnI results.  Refer to the "Links" section for chest pain algorithms and additional  guidance. Performed at [facility], [place] [address], [place], [place] [address]   . SARS Coronavirus 2 05/20/2020 NEGATIVE  NEGATIVE Final   Comment: (NOTE) SARS-CoV-2 target nucleic acids are NOT DETECTED.  The SARS-CoV-2 RNA is generally detectable in upper and lower respiratory specimens during the acute phase of infection. The lowest concentration of SARS-CoV-2 viral copies this assay can detect is 250 copies / mL. A negative result does not preclude SARS-CoV-2 infection and should not be used as the sole basis for treatment or other patient management decisions.  A negative result may occur with improper specimen collection / handling, submission of specimen other than nasopharyngeal swab, presence of viral mutation(s) within the areas targeted by this assay, and inadequate number of viral copies (<250 copies / mL). A negative result must be combined with clinical observations, patient history, and epidemiological information.  Fact Sheet for Patients:   StrictlyIdeas.no  Fact Sheet for Healthcare Providers: BankingDealers.co.za  This test is not yet approved or                           cleared by the [place] FDA and has been authorized for detection and/or diagnosis of SARS-CoV-2 by FDA under an Emergency Use Authorization (EUA).  This EUA will remain in effect (meaning this test can be  used) for the duration of the COVID-19 declaration under Section 564(b)(1) of the Act, 21 U.S.C. section 360bbb-3(b)(1), unless the authorization is terminated or revoked sooner.  Performed at [facility], [place] [address], [place], [place] [address]   Admission on 02/20/2020, Discharged on 02/21/2020  Component Date Value Ref Range Status  . Alcohol, Ethyl (B) 02/20/2020 <10  <10 mg/dL Final   Comment: (NOTE) Lowest detectable limit for serum alcohol is 10 mg/dL.  For medical purposes only. Performed at [facility], [place] [address] [place],  [address]   . POC Amphetamine UR 02/20/2020 None Detected  None Detected Final  . POC Secobarbital (BAR) 02/20/2020 None Detected  None Detected Final  . POC Buprenorphine (BUP) 02/20/2020 None Detected  None Detected Final  . POC Oxazepam (BZO) 02/20/2020 None Detected  None Detected Final  . POC Cocaine UR 02/20/2020 None Detected  None Detected Final  . POC Methamphetamine UR 02/20/2020 None Detected  None Detected Final  . POC Morphine 02/20/2020 None Detected  None Detected Final  . POC Oxycodone UR 02/20/2020 None Detected  None Detected Final  . POC Methadone UR 02/20/2020 None Detected  None Detected Final  . POC Marijuana UR 02/20/2020 Positive* None Detected Final  . Sodium 02/20/2020 138  135 - 145 mmol/L Final  . Potassium 02/20/2020 4.2  3.5 - 5.1 mmol/L Final  .  Chloride 02/20/2020 105  98 - 111 mmol/L Final  . CO2 02/20/2020 24  22 - 32 mmol/L Final  . Glucose, Bld 02/20/2020 108* 70 - 99 mg/dL Final   Glucose reference range applies only to samples taken after fasting for at least 8 hours.  . BUN 02/20/2020 11  6 - 20 mg/dL Final  . Creatinine, Ser 02/20/2020 0.83  0.61 - 1.24 mg/dL Final  . Calcium 02/20/2020 9.3  8.9 - 10.3 mg/dL Final  . Total Protein 02/20/2020 7.0  6.5 - 8.1 g/dL Final  . Albumin 02/20/2020 4.0  3.5 - 5.0 g/dL Final  . AST 02/20/2020 23  15 - 41 U/L Final  . ALT  02/20/2020 29  0 - 44 U/L Final  . Alkaline Phosphatase 02/20/2020 53  38 - 126 U/L Final  . Total Bilirubin 02/20/2020 0.8  0.3 - 1.2 mg/dL Final  . GFR calc non Af Amer 02/20/2020 >60  >60 mL/min Final  . GFR calc Af Amer 02/20/2020 >60  >60 mL/min Final  . Anion gap 02/20/2020 9  5 - 15 Final   Performed at [place] [address], [place], [place] [address]  . WBC 02/20/2020 7.5  4.0 - 10.5 K/uL Final  . RBC 02/20/2020 6.10* 4.22 - 5.81 MIL/uL Final  . Hemoglobin 02/20/2020 14.2  13.0 - 17.0 g/dL Final  . HCT 02/20/2020 45.8  39 - 52 % Final  . MCV 02/20/2020 75.1* 80.0 - 100.0 fL Final  . MCH 02/20/2020 23.3* 26.0 - 34.0 pg Final  . MCHC 02/20/2020 31.0  30.0 - 36.0 g/dL Final  . RDW 02/20/2020 15.3  11.5 - 15.5 % Final  . Platelets 02/20/2020 204  150 - 400 K/uL Final  . nRBC 02/20/2020 0.0  0.0 - 0.2 % Final  . Neutrophils Relative % 02/20/2020 47  % Final  . Neutro Abs 02/20/2020 3.6  1.7 - 7.7 K/uL Final  . Lymphocytes Relative 02/20/2020 38  % Final  . Lymphs Abs 02/20/2020 2.8  0.7 - 4.0 K/uL Final  . Monocytes Relative 02/20/2020 8  % Final  . Monocytes Absolute 02/20/2020 0.6  0 - 1 K/uL Final  . Eosinophils Relative 02/20/2020 6  % Final  . Eosinophils Absolute 02/20/2020 0.4  0 - 0 K/uL Final  . Basophils Relative 02/20/2020 1  % Final  . Basophils Absolute 02/20/2020 0.0  0 - 0 K/uL Final  . Immature Granulocytes 02/20/2020 0  % Final  . Abs Immature Granulocytes 02/20/2020 0.02  0.00 - 0.07 K/uL Final   Performed at [facility], [place] [address] [place], [place] [address]  . Glucose-Capillary 02/20/2020 135* 70 - 99 mg/dL Final   Glucose reference range applies only to samples taken after fasting for at least 8 hours.  . Glucose-Capillary 02/21/2020 128* 70 - 99 mg/dL Final   Glucose reference range applies only to samples taken after fasting for at least 8 hours.  [place] Kitchen SARS Coronavirus 2 Ag 02/20/2020 NEGATIVE  NEGATIVE Final   Performed at [facility], [place] [address] [place], [place] [address]  Admission on 02/10/2020, Discharged on 02/12/2020  Component Date Value Ref Range Status  . SARS Coronavirus 2 02/10/2020 NEGATIVE  NEGATIVE Final   Comment: (NOTE) SARS-CoV-2 target nucleic acids are NOT DETECTED.  The SARS-CoV-2 RNA is generally detectable in upper and lower respiratory specimens during the acute phase of infection. The lowest concentration of SARS-CoV-2 viral copies this assay can detect is  250 copies / mL. A negative result does not preclude SARS-CoV-2 infection and should not be used as the sole basis for treatment or other patient management decisions.  A negative result may occur with improper specimen collection / handling, submission of specimen other than nasopharyngeal swab, presence of viral mutation(s) within the areas targeted by this assay, and inadequate number of viral copies (<250 copies / mL). A negative result must be combined with clinical observations, patient history, and epidemiological information.  Fact Sheet for Patients:   StrictlyIdeas.no  Fact Sheet for Healthcare Providers: BankingDealers.co.za  This test is not yet approved or                           cleared by the [place] FDA and has been authorized for detection and/or diagnosis of SARS-CoV-2 by FDA under an Emergency Use Authorization (EUA).  This EUA will remain in effect (meaning this test can be used) for the duration of the COVID-19 declaration under Section 564(b)(1) of the Act, 21 U.S.C. section 360bbb-3(b)(1), unless the authorization is terminated or revoked sooner.  Performed at [facility], [place] [address] [place], [place] [address]   . WBC 02/11/2020 8.5  4.0 - 10.5 K/uL Final  . RBC 02/11/2020 5.95* 4.22 - 5.81 MIL/uL Final  . Hemoglobin 02/11/2020 14.1  13.0 - 17.0 g/dL Final  . HCT 02/11/2020 45.9  39 - 52 % Final  . MCV 02/11/2020  77.1* 80.0 - 100.0 fL Final  . MCH 02/11/2020 23.7* 26.0 - 34.0 pg Final  . MCHC 02/11/2020 30.7  30.0 - 36.0 g/dL Final  . RDW 02/11/2020 15.2  11.5 - 15.5 % Final  . Platelets 02/11/2020 268  150 - 400 K/uL Final  . nRBC 02/11/2020 0.0  0.0 - 0.2 % Final   Performed at [facility], [place] [address] [place], [place] [address]  . Sodium 02/11/2020 141  135 - 145 mmol/L Final  . Potassium 02/11/2020 4.2  3.5 - 5.1 mmol/L Final  . Chloride 02/11/2020 104  98 - 111 mmol/L Final  . CO2 02/11/2020 24  22 - 32 mmol/L Final  . Glucose, Bld 02/11/2020 113* 70 - 99 mg/dL Final   Glucose reference range applies only to samples taken after fasting for at least 8 hours.  . BUN 02/11/2020 14  6 - 20 mg/dL Final  . Creatinine, Ser 02/11/2020 0.85  0.61 - 1.24 mg/dL Final  . Calcium 02/11/2020 9.5  8.9 - 10.3 mg/dL Final  . Total Protein 02/11/2020 6.8  6.5 - 8.1 g/dL Final  . Albumin 02/11/2020 3.8  3.5 - 5.0 g/dL Final  . AST 02/11/2020 20  15 - 41 U/L Final  . ALT 02/11/2020 22  0 - 44 U/L Final  . Alkaline Phosphatase 02/11/2020 60  38 - 126 U/L Final  . Total Bilirubin 02/11/2020 0.6  0.3 - 1.2 mg/dL Final  . GFR calc non Af Amer 02/11/2020 >60  >60 mL/min Final  . GFR calc Af Amer 02/11/2020 >60  >60 mL/min Final  . Anion gap 02/11/2020 13  5 - 15 Final   Performed at [facility], [place] [address] [place], [place] [address]  . Cholesterol 02/11/2020 115  0 - 200 mg/dL Final  . Triglycerides 02/11/2020 85  <150 mg/dL Final  . HDL 02/11/2020 47  >40 mg/dL Final  . Total CHOL/HDL Ratio 02/11/2020 2.4  RATIO Final  . VLDL 02/11/2020  17  0 - 40 mg/dL Final  . LDL Cholesterol 02/11/2020 51  0 - 99 mg/dL Final   Comment:        Total Cholesterol/HDL:CHD Risk Coronary Heart Disease Risk Table                     Men   Women  1/2 Average Risk   3.4   3.3  Average Risk       5.0   4.4  2 X Average Risk   9.6   7.1  3 X Average Risk  23.4   11.0        Use  the calculated Patient Ratio above and the CHD Risk Table to determine the patient's CHD Risk.        ATP III CLASSIFICATION (LDL):  <100     mg/dL   Optimal  100-129  mg/dL   Near or Above                    Optimal  130-159  mg/dL   Borderline  160-189  mg/dL   High  >190     mg/dL   Very High Performed at [place] [facility] [address] [place], [place] [address]   . Glucose-Capillary 02/11/2020 132* 70 - 99 mg/dL Final   Glucose reference range applies only to samples taken after fasting for at least 8 hours.  . Glucose-Capillary 02/11/2020 148* 70 - 99 mg/dL Final   Glucose reference range applies only to samples taken after fasting for at least 8 hours.  . Glucose-Capillary 02/11/2020 105* 70 - 99 mg/dL Final   Glucose reference range applies only to samples taken after fasting for at least 8 hours.  . Glucose-Capillary 02/12/2020 78  70 - 99 mg/dL Final   Glucose reference range applies only to samples taken after fasting for at least 8 hours.  Office Visit on 01/31/2020  Component Date Value Ref Range Status  . Hemoglobin A1C 01/31/2020 7.8* 4.0 - 5.6 % Final  . POC Glucose 01/31/2020 156* 70 - 99 mg/dl Final  Office Visit on 11/23/2019  Component Date Value Ref Range Status  . POC Glucose 11/23/2019 138* 70 - 99 mg/dl Final  . HbA1c, POC (controlled diabetic ra* 11/23/2019 7.5* 0.0 - 7.0 % Final  . Creatinine, Urine 11/23/2019 149.8  Not Estab. mg/dL Final  . Microalbumin, Urine 11/23/2019 66.9  Not Estab. ug/mL Final  . Microalb/Creat Ratio 11/23/2019 45* 0 - 29 mg/g creat Final   Comment:                        Normal:                0 -  29                        Moderately increased: 30 - 300                        Severely increased:       >300   . Uric Acid 11/23/2019 6.0  3.8 - 8.4 mg/dL Final              Therapeutic target for gout patients: <6.0  . Anti Nuclear Antibody (ANA) 11/23/2019 Negative  Negative Final  . Rhuematoid fact SerPl-aCnc  11/23/2019 14.0* 0.0 - 13.9 IU/mL Final  . Sed Rate 11/23/2019  10  0 - 15 mm/hr Final  . WBC 11/23/2019 6.7  3.4 - 10.8 x10E3/uL Final  . RBC 11/23/2019 6.41* 4.14 - 5.80 x10E6/uL Final  . Hemoglobin 11/23/2019 15.3  13.0 - 17.7 g/dL Final  . Hematocrit 11/23/2019 49.0  37.5 - 51.0 % Final  . MCV 11/23/2019 76* 79 - 97 fL Final  . MCH 11/23/2019 23.9* 26.6 - 33.0 pg Final  . MCHC 11/23/2019 31.2* 31 - 35 g/dL Final  . RDW 11/23/2019 15.0  11.6 - 15.4 % Final  . Platelets 11/23/2019 267  150 - 450 x10E3/uL Final  . TSH 11/23/2019 0.575  0.450 - 4.500 uIU/mL Final  . T4, Total 11/23/2019 10.5  4.5 - 12.0 ug/dL Final  . Glucose 11/23/2019 143* 65 - 99 mg/dL Final  . BUN 11/23/2019 11  6 - 24 mg/dL Final  . Creatinine, Ser 11/23/2019 0.83  0.76 - 1.27 mg/dL Final  . GFR calc non Af Amer 11/23/2019 106  >59 mL/min/1.73 Final  . GFR calc Af Amer 11/23/2019 123  >59 mL/min/1.73 Final  . BUN/Creatinine Ratio 11/23/2019 13  9 - 20 Final  . Sodium 11/23/2019 136  134 - 144 mmol/L Final  . Potassium 11/23/2019 4.5  3.5 - 5.2 mmol/L Final  . Chloride 11/23/2019 102  96 - 106 mmol/L Final  . CO2 11/23/2019 20  20 - 29 mmol/L Final  . Calcium 11/23/2019 9.5  8.7 - 10.2 mg/dL Final  . Total Protein 11/23/2019 7.2  6.0 - 8.5 g/dL Final  . Albumin 11/23/2019 4.3  4.0 - 5.0 g/dL Final  . Globulin, Total 11/23/2019 2.9  1.5 - 4.5 g/dL Final  . Albumin/Globulin Ratio 11/23/2019 1.5  1.2 - 2.2 Final  . Bilirubin Total 11/23/2019 0.5  0.0 - 1.2 mg/dL Final  . Alkaline Phosphatase 11/23/2019 61  39 - 117 IU/L Final  . AST 11/23/2019 36  0 - 40 IU/L Final  . ALT 11/23/2019 37  0 - 44 IU/L Final    Allergies: Patient has no known allergies.  Prior to Admission medications   Medication Sig Start Date End Date Taking? Authorizing Provider  amLODipine (NORVASC) 5 MG tablet Take 1 tablet (5 mg total) by mouth daily. Patient not taking: Reported on 05/20/2020 02/12/20   Connye Burkitt, NP  atorvastatin  (LIPITOR) 40 MG tablet Take 1 tablet (40 mg total) by mouth daily. Must have OV for refills Patient not taking: Reported on 05/20/2020 02/12/20   Connye Burkitt, NP  benztropine (COGENTIN) 0.5 MG tablet Take 1 tablet (0.5 mg total) by mouth 2 (two) times daily. Patient not taking: Reported on 05/20/2020 05/13/20   Salley Slaughter, NP  gabapentin (NEURONTIN) 600 MG tablet 1 pill twice per day and 2 at bedtime Patient not taking: Reported on 05/20/2020 03/13/20   [place] Crane, MD  glipiZIDE (GLUCOTROL XL) 10 MG 24 hr tablet Take 1 tablet (10 mg total) by mouth daily with breakfast. To lower blood sugar Patient not taking: Reported on 05/20/2020 02/12/20   Connye Burkitt, NP  haloperidol decanoate (HALDOL DECANOATE) 50 MG/ML injection Inject 2 mLs (100 mg total) into the muscle every 30 (thirty) days. 03/13/20   [place] Crane, MD  hydrALAZINE (APRESOLINE) 25 MG tablet 1 in the morning and 1 with lunch Patient not taking: Reported on 05/20/2020 02/12/20   Connye Burkitt, NP  losartan-hydrochlorothiazide (HYZAAR) 100-25 MG tablet Take 1 tablet by mouth daily. To lower blood pressure Patient not taking: Reported on 05/20/2020 02/12/20  Connye Burkitt, NP  metFORMIN (GLUCOPHAGE) 1000 MG tablet Take 1 tablet (1,000 mg total) by mouth 2 (two) times daily with a meal. For diabetes management Patient not taking: Reported on 05/20/2020 02/12/20   Connye Burkitt, NP  pioglitazone (ACTOS) 30 MG tablet Take 1 tablet (30 mg total) by mouth daily. Patient not taking: Reported on 05/20/2020 02/12/20   Connye Burkitt, NP  predniSONE (DELTASONE) 5 MG tablet Take 1.5 tablets (7.5 mg total) by mouth daily with breakfast. Patient not taking: Reported on 05/20/2020 02/12/20   Connye Burkitt, NP    Medical Decision Making  Patient was medically cleared in the emergency department  Increase cogentin to 1 mg BID for EPS/Akathisia, first dose now Resume amlodipine 5 mg daily for HTN, first dose now Resume losartan 100 mg daily for  HTN, first dose now Resume HCTZ 25 mg daily for HTN, first dose now Resume glipizide XL 10 mg daily for DM Resume metformin 1000 mg BID for DM Resume pioglitazone 30 mg daily for DM    Recommendations  Based on my evaluation the patient does not appear to have an emergency medical condition.  Rozetta Nunnery, NP 05/21/20  5:00 AM

## 2020-05-22 MED FILL — risperiDONE 1 MG TABS: 1 | 30 days supply | Qty: 60 | Fill #0

## 2020-05-26 ENCOUNTER — Encounter (HOSPITAL_COMMUNITY): Payer: Self-pay

## 2020-05-26 ENCOUNTER — Ambulatory Visit (INDEPENDENT_AMBULATORY_CARE_PROVIDER_SITE_OTHER): Payer: No Payment, Other | Admitting: *Deleted

## 2020-05-26 ENCOUNTER — Other Ambulatory Visit: Payer: Self-pay

## 2020-05-26 VITALS — BP 138/93 | HR 85 | Ht 75.5 in | Wt 263.0 lb

## 2020-05-26 DIAGNOSIS — F251 Schizoaffective disorder, depressive type: Secondary | ICD-10-CM | POA: Diagnosis not present

## 2020-05-26 MED ORDER — HALOPERIDOL DECANOATE 100 MG/ML IM SOLN
100.0000 mg | INTRAMUSCULAR | Status: DC
  Administered 2020-05-26: 100 mg via INTRAMUSCULAR

## 2020-05-26 MED FILL — ?LOSARTAN POTASSIUM-HCTZ 10: 100-25 | 30 days supply | Qty: 30 | Fill #0

## 2020-05-26 MED FILL — PIOGLITAZONE HCL 30 MG TAB: 30 | 30 days supply | Qty: 30 | Fill #0

## 2020-05-26 MED FILL — cloNIDine HCL 0.2 MG TABS: 0.2 | 30 days supply | Qty: 60 | Fill #0

## 2020-05-26 MED FILL — ?PREDNISONE 5 MG TABLET: 5 MG | 30 days supply | Qty: 45 | Fill #0

## 2020-05-26 MED FILL — METFORMIN HCL 1000 MG TABS: 1000 | 30 days supply | Qty: 60 | Fill #0

## 2020-05-26 MED FILL — glipiZIDE XL 10 MG TB24: 10 | 30 days supply | Qty: 30 | Fill #0

## 2020-05-26 MED FILL — AMLODIPINE BESYLATE 5 MG TA: 5 | 30 days supply | Qty: 30 | Fill #0

## 2020-05-26 MED FILL — GABAPENTIN 600 MG TABLET: 600 | 30 days supply | Qty: 120 | Fill #0

## 2020-05-26 MED FILL — ?ATORVASTATIN 40MG TABLET: 40 | 30 days supply | Qty: 30 | Fill #0

## 2020-05-26 MED FILL — hydrALAZINE HCL 25 MG TABS: 25 | 30 days supply | Qty: 60 | Fill #0

## 2020-05-26 NOTE — Progress Notes (Signed)
Patient came in today for Monthly Haldol Injection  100 mg.  Patient did inform that he recently was @ [facility] due to hearing voices & was started on Risperidone 1 mg  2 x daily.  Patient stated that on his next visit with the Provider he will ask if switching off the Haldol to a Med with a better side effect profile. Patient was pleasant & talkative. Tolerated injection well in Left arm.

## 2020-06-20 ENCOUNTER — Other Ambulatory Visit: Payer: Self-pay | Admitting: Family Medicine

## 2020-06-20 NOTE — Telephone Encounter (Signed)
° °  Notes to clinic:  Review for refill Last filled by a different provider    Requested Prescriptions  Pending Prescriptions Disp Refills   glipiZIDE (GLUCOTROL XL) 10 MG 24 hr tablet 30 tablet 0    Sig: Take 1 tablet (10 mg total) by mouth daily with breakfast.      Endocrinology:  Diabetes - Sulfonylureas Passed - 06/20/2020  2:21 PM      Passed - HBA1C is between 0 and 7.9 and within 180 days    Hemoglobin A1C  Date Value Ref Range Status  01/31/2020 7.8 (A) 4.0 - 5.6 % Final   HbA1c, POC (controlled diabetic range)  Date Value Ref Range Status  11/23/2019 7.5 (A) 0.0 - 7.0 % Final          Passed - Valid encounter within last 6 months    Recent Outpatient Visits           4 months ago Uncontrolled type 2 diabetes mellitus with hyperglycemia [facility])   [facility] [place], [place], [place]   7 months ago Uncontrolled type 2 diabetes mellitus with hyperglycemia ([place])   [facility] Fulp, Mol[place], MD   4 years ago Uncontrolled type 2 diabetes mellitus with hyperglycemia, with long-term current use of insulin ([place])   [facility] Buchanan, B[place], MD   5 years ago Type 2 diabetes mellitus without complication   [facility] [place], Adriana Mccallum, MD   5 years ago Essential hypertension   [facility] Boykin Nearing, MD

## 2020-06-20 NOTE — Telephone Encounter (Signed)
Medication Refill - Medication: glipizide 10mg   Has the patient contacted their pharmacy? Yes.   (Agent: If no, request that the patient contact the pharmacy for the refill.) (Agent: If yes, when and what did the pharmacy advise?)  Preferred Pharmacy (with phone number or street name): [place],  - [id number] [place]: Please be advised that RX refills may take up to 3 business days. We ask that you follow-up with your pharmacy.

## 2020-06-23 ENCOUNTER — Ambulatory Visit (HOSPITAL_COMMUNITY): Payer: No Payment, Other

## 2020-06-23 MED ORDER — GLIPIZIDE ER 10 MG PO TB24: 10.0000 mg | ORAL_TABLET | Freq: Every day | ORAL | 0 refills | Status: DC

## 2020-06-25 ENCOUNTER — Encounter (HOSPITAL_COMMUNITY): Payer: Self-pay | Admitting: Psychiatry

## 2020-06-25 ENCOUNTER — Other Ambulatory Visit: Payer: Self-pay

## 2020-06-25 ENCOUNTER — Ambulatory Visit (INDEPENDENT_AMBULATORY_CARE_PROVIDER_SITE_OTHER): Payer: No Payment, Other | Admitting: Psychiatry

## 2020-06-25 ENCOUNTER — Ambulatory Visit (HOSPITAL_COMMUNITY): Payer: No Payment, Other

## 2020-06-25 ENCOUNTER — Encounter (HOSPITAL_COMMUNITY): Payer: Self-pay

## 2020-06-25 VITALS — BP 139/97 | HR 81 | Ht 75.5 in | Wt 306.0 lb

## 2020-06-25 DIAGNOSIS — F251 Schizoaffective disorder, depressive type: Secondary | ICD-10-CM

## 2020-06-25 MED ORDER — RISPERIDONE 1 MG PO TABS: 1.0000 mg | ORAL_TABLET | Freq: Two times a day (BID) | ORAL | 1 refills | Status: DC

## 2020-06-25 MED ORDER — BENZTROPINE MESYLATE 1 MG PO TABS: 1.0000 mg | ORAL_TABLET | Freq: Two times a day (BID) | ORAL | 1 refills | Status: DC

## 2020-06-25 MED FILL — BENZTROPINE MES 1 MG TABLET: 1 | 30 days supply | Qty: 60 | Fill #0

## 2020-06-25 MED FILL — risperiDONE 1 MG TABS: 1 | 30 days supply | Qty: 60 | Fill #0

## 2020-06-25 MED FILL — GABAPENTIN 600 MG TABLET: 600 | 30 days supply | Qty: 120 | Fill #1

## 2020-06-25 NOTE — Progress Notes (Signed)
[facility] OP Progress Note   Patient Identification: Duane Salazar MRN:  [id number] Date of Evaluation:  06/25/2020    Chief Complaint:  " I am doing well."  Visit Diagnosis:    ICD-10-CM   1. Schizoaffective disorder, depressive type ([place])  F25.1     History of Present Illness: Patient presented for follow-up.  He is also due to get his IM Haldol Decanoate 100 mg dose today.  As per EMR, patient had presented to [facility] long ED in September followed by a visit to [facility] on September 22 where he was assessed to have akathisia.  His dose of Cogentin was increased to 1 mg twice daily and he was also given oral risperidone.  He was prescribed risperidone 1 mg twice daily with a plan of switching his IM Haldol Decanoate monthly shot to [place] sustenna in the near future.  Patient reported that he is doing better.  He informed that his symptoms of akathisia improved significantly after the dose of Cogentin was increased and he was switched to oral risperidone.  He stated that he feels risperidone is very good medicine and he wants to continue taking it. He denied any paranoid delusions, auditory and visual hallucinations.  He reported he is sleeping well.  Upon examination, patient did not appear to have any tremors or restlessness or psychomotor agitation. He answered all the questions and in appropriate manner.  Writer informed the patient that if he likes hold risperidone and he wants to take monthly injectable then we can switch him to [place].  However he stated that he wants to continue taking oral risperidone.  He asked if he can still get IM Haldol decanoate      Past Medical History:  Past Medical History:  Diagnosis Date  . 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 AKI (acute kidney injury) ([place]) 03/21/2015  . Arthritis Dx 2002  . Diabetes type 2, controlled ([place]) 2010  . Headache   . Hypertension Dx 2002  . 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 Schizo affective schizophrenia ([place]) 1995  .  Schizophrenia ([place])   . Seizures ([place])   . Sleep apnea 1995   on CPAP    Past Surgical History:  Procedure Laterality Date  . PITUITARY SURGERY  2005 & 2012  . SKIN BIOPSY      Family Psychiatric History: see below  Family History:  Family History  Problem Relation Age of Onset  . Hypertension Mother   . Diabetes Mother   . Heart Problems Mother   . Cancer Maternal Uncle   . Alcoholism Maternal Uncle   . Cancer Maternal Grandmother   . Heart disease Neg Hx     Social History:   Social History   Socioeconomic History  . Marital status: Single    Spouse name: Not on file  . Number of children: 2  . Years of education: GED  . Highest education level: Not on file  Occupational History  . Not on file  Tobacco Use  . Smoking status: Current Every Day Smoker    Packs/day: 1.00    Years: 20.00    Pack years: 20.00    Types: Cigarettes  . Smokeless tobacco: Never Used  . Tobacco comment: Smoking .5 ppd  Substance and Sexual Activity  . Alcohol use: Yes    Alcohol/week: 2.0 standard drinks    Types: 2 Cans of beer per week    Comment: 2 40s a day  . Drug use: Yes    Types: Cocaine,  Marijuana, Heroin    Comment: Reports used 3 weeks ago. 10/17/2015  . Sexual activity: Yes  Other Topics Concern  . Not on file  Social History Narrative   Lives with mom.   Incarcerated for 22 months in [place], [place]. From 2014-09/2014   Social Determinants of Health   Financial Resource Strain:   . Difficulty of Paying Living Expenses: Not on file  Food Insecurity:   . Worried About Charity fundraiser in the Last Year: Not on file  . Ran Out of Food in the Last Year: Not on file  Transportation Needs:   . Lack of Transportation (Medical): Not on file  . Lack of Transportation (Non-Medical): Not on file  Physical Activity:   . Days of Exercise per Week: Not on file  . Minutes of Exercise per Session: Not on file  Stress:   . Feeling of Stress : Not on file  Social  Connections:   . Frequency of Communication with Friends and Family: Not on file  . Frequency of Social Gatherings with Friends and Family: Not on file  . Attends Religious Services: Not on file  . Active Member of Clubs or Organizations: Not on file  . Attends Archivist Meetings: Not on file  . Marital Status: Not on file    Additional Social History: Lives at [facility], unemployed  Allergies:  No Known Allergies  Metabolic Disorder Labs: Lab Results  Component Value Date   HGBA1C 7.8 (A) 01/31/2020   No results found for: PROLACTIN Lab Results  Component Value Date   CHOL 115 02/11/2020   TRIG 85 02/11/2020   HDL 47 02/11/2020   CHOLHDL 2.4 02/11/2020   VLDL 17 02/11/2020   LDLCALC 51 02/11/2020   LDLCALC 96 10/21/2014   Lab Results  Component Value Date   TSH 0.575 11/23/2019    Therapeutic Level Labs: No results found for: LITHIUM No results found for: CBMZ No results found for: VALPROATE  Current Medications: Current Outpatient Medications  Medication Sig Dispense Refill  . amLODipine (NORVASC) 5 MG tablet Take 1 tablet (5 mg total) by mouth daily. 30 tablet 0  . benztropine (COGENTIN) 1 MG tablet Take 1 tablet (1 mg total) by mouth 2 (two) times daily. 60 tablet 0  . glipiZIDE (GLUCOTROL XL) 10 MG 24 hr tablet Take 1 tablet (10 mg total) by mouth daily with breakfast. 30 tablet 0  . losartan-hydrochlorothiazide (HYZAAR) 100-25 MG tablet Take 1 tablet by mouth daily. To lower blood pressure 30 tablet 0  . metFORMIN (GLUCOPHAGE) 1000 MG tablet Take 1 tablet (1,000 mg total) by mouth 2 (two) times daily with a meal. 60 tablet 0  . pioglitazone (ACTOS) 30 MG tablet Take 1 tablet (30 mg total) by mouth daily. 30 tablet 0  . risperiDONE (RISPERDAL) 1 MG tablet Take 1 tablet (1 mg total) by mouth 2 (two) times daily. 60 tablet 0   Current Facility-Administered Medications  Medication Dose Route Frequency Provider Last Rate Last  Admin  . haloperidol decanoate (HALDOL DECANOATE) 100 MG/ML injection 100 mg  100 mg Intramuscular Q30 days [place] Crane, MD   100 mg at 05/26/20 0930    Musculoskeletal: Strength & Muscle Tone: within normal limits Gait & Station: normal Patient leans: N/A  Psychiatric Specialty Exam: Review of Systems  Blood pressure (!) 139/97, pulse 81, height 6' 3.5" (1.918 m), weight (!) 306 lb (138.8 kg).Body mass index is 37.74 kg/m.  General Appearance: Fairly Groomed  Eye Contact:  Good  Speech:  Clear and Coherent and Normal Rate  Volume:  Normal  Mood:  Euthymic  Affect:  Restricted  Thought Process:  Goal Directed and Descriptions of Associations: Intact  Orientation:  Full (Time, Place, and Person)  Thought Content:  Logical and Denied any hallucinations, denied paranoid delusions  Suicidal Thoughts:  No  Homicidal Thoughts:  No  Memory:  Immediate;   Good Recent;   Good  Judgement:  Fair  Insight:  Fair  Psychomotor Activity:  Normal  Concentration:  Concentration: Good and Attention Span: Good  Recall:  Good  Fund of Knowledge:Good  Language: Good  Akathisia:  Negative  Handed:  Right  AIMS (if indicated):  1  Assets:  Communication Skills Desire for Improvement Financial Resources/Insurance  ADL's:  Intact  Cognition: WNL  Sleep:  Fair   Screenings: AIMS     Admission (Discharged) from OP Visit from 02/10/2020 in [facility] Admission (Discharged) from 09/18/2015 in [place] [facility] Admission (Discharged) from 07/06/2015 in [place] Total Score 0 0 0    AUDIT     Admission (Discharged) from OP Visit from 02/10/2020 in [place] [facility] Admission (Discharged) from 09/18/2015 in [place] [facility] Admission (Discharged) from 07/06/2015 in [place] Admission (Discharged) from 06/27/2015 in [place]  Alcohol Use Disorder Identification Test Final Score (AUDIT) 3 8 39 31    GAD-7     Office Visit from 11/23/2019 in [facility] Office Visit from 10/17/2015 in [place]  Total GAD-7 Score 17 21    PHQ2-9     Office Visit from 11/23/2019 in [place] Office Visit from 10/22/2015 in [place] Office Visit from 10/17/2015 in [place] Office Visit from 01/02/2015 in [employer]  PHQ-2 Total Score 2 0 4 0  PHQ-9 Total Score 8 -- 23 --      Assessment and Plan: Patient reported doing fairly well and is now currently taking oral risperidone 1 mg twice daily which he finds to be helpful.  He denied any more ongoing akathisia or any other EPS.  Patient was offered the prospect of switching the oral risperidone to IM [place] injectable monthly injection dose.  However he stated that he would like to stay on what he is taking.  1. Schizoaffective disorder, depressive type ([place])  - Continue risperiDONE (RISPERDAL) 1 MG tablet; Take 1 tablet (1 mg total) by mouth 2 (two) times daily.  Dispense: 60 tablet; Refill: 1 - Continue benztropine (COGENTIN) 1 MG tablet; Take 1 tablet (1 mg total) by mouth 2 (two) times daily.  Dispense: 60 tablet; Refill: 1   Continue same medication regimen. Follow up in 8 weeks.    [place] Crane, MD 10/27/20219:42 AM

## 2020-07-09 ENCOUNTER — Ambulatory Visit: Payer: Self-pay | Admitting: Family Medicine

## 2020-07-20 ENCOUNTER — Emergency Department (HOSPITAL_COMMUNITY)
Admission: EM | Admit: 2020-07-20 | Discharge: 2020-07-20 | Disposition: A | Discharge status: Home / Self Care | Payer: Self-pay | Attending: Emergency Medicine | Admitting: Emergency Medicine

## 2020-07-20 ENCOUNTER — Encounter (HOSPITAL_COMMUNITY): Payer: Self-pay | Admitting: Emergency Medicine

## 2020-07-20 ENCOUNTER — Other Ambulatory Visit: Payer: Self-pay

## 2020-07-20 DIAGNOSIS — Y908 Blood alcohol level of 240 mg/100 ml or more: Secondary | ICD-10-CM | POA: Insufficient documentation

## 2020-07-20 DIAGNOSIS — I10 Essential (primary) hypertension: Secondary | ICD-10-CM | POA: Insufficient documentation

## 2020-07-20 DIAGNOSIS — F1910 Other psychoactive substance abuse, uncomplicated: Secondary | ICD-10-CM

## 2020-07-20 DIAGNOSIS — Z79899 Other long term (current) drug therapy: Secondary | ICD-10-CM | POA: Insufficient documentation

## 2020-07-20 DIAGNOSIS — F17210 Nicotine dependence, cigarettes, uncomplicated: Secondary | ICD-10-CM | POA: Insufficient documentation

## 2020-07-20 DIAGNOSIS — E119 Type 2 diabetes mellitus without complications: Secondary | ICD-10-CM | POA: Insufficient documentation

## 2020-07-20 DIAGNOSIS — Z760 Encounter for issue of repeat prescription: Secondary | ICD-10-CM | POA: Insufficient documentation

## 2020-07-20 DIAGNOSIS — E1165 Type 2 diabetes mellitus with hyperglycemia: Secondary | ICD-10-CM

## 2020-07-20 DIAGNOSIS — F1210 Cannabis abuse, uncomplicated: Secondary | ICD-10-CM | POA: Insufficient documentation

## 2020-07-20 DIAGNOSIS — F1410 Cocaine abuse, uncomplicated: Secondary | ICD-10-CM | POA: Insufficient documentation

## 2020-07-20 DIAGNOSIS — F1010 Alcohol abuse, uncomplicated: Secondary | ICD-10-CM | POA: Insufficient documentation

## 2020-07-20 HISTORY — DX: Other psychoactive substance abuse, uncomplicated: F19.10

## 2020-07-20 LAB — COMPREHENSIVE METABOLIC PANEL
ALT: 70 U/L — ABNORMAL HIGH (ref 0–44)
AST: 71 U/L — ABNORMAL HIGH (ref 15–41)
Albumin: 4.2 g/dL (ref 3.5–5.0)
Alkaline Phosphatase: 41 U/L (ref 38–126)
Anion gap: 9 (ref 5–15)
BUN: 9 mg/dL (ref 6–20)
CO2: 24 mmol/L (ref 22–32)
Calcium: 9.1 mg/dL (ref 8.9–10.3)
Chloride: 102 mmol/L (ref 98–111)
Creatinine, Ser: 0.98 mg/dL (ref 0.61–1.24)
GFR, Estimated: >60 mL/min (ref >60)
Glucose, Bld: 213 mg/dL — ABNORMAL HIGH (ref 70–99)
Potassium: 3.8 mmol/L (ref 3.5–5.1)
Sodium: 135 mmol/L (ref 135–145)
Total Bilirubin: 1.4 mg/dL — ABNORMAL HIGH (ref 0.3–1.2)
Total Protein: 7.7 g/dL (ref 6.5–8.1)

## 2020-07-20 LAB — CBC
HCT: 48.6 % (ref 39.0–52.0)
Hemoglobin: 15.4 g/dL (ref 13.0–17.0)
MCH: 23.8 pg — ABNORMAL LOW (ref 26.0–34.0)
MCHC: 31.7 g/dL (ref 30.0–36.0)
MCV: 75.2 fL — ABNORMAL LOW (ref 80.0–100.0)
Platelets: 359 10*3/uL (ref 150–400)
RBC: 6.46 MIL/uL — ABNORMAL HIGH (ref 4.22–5.81)
RDW: 15.5 % (ref 11.5–15.5)
WBC: 7.8 10*3/uL (ref 4.0–10.5)
nRBC: 0 % (ref 0.0–0.2)

## 2020-07-20 LAB — RAPID URINE DRUG SCREEN, HOSP PERFORMED
Amphetamines: NOT DETECTED
Barbiturates: NOT DETECTED
Benzodiazepines: NOT DETECTED
Cocaine: POSITIVE — AB
Opiates: NOT DETECTED
Tetrahydrocannabinol: POSITIVE — AB

## 2020-07-20 LAB — ACETAMINOPHEN LEVEL: Acetaminophen (Tylenol), Serum: <10 ug/mL — ABNORMAL LOW (ref 10–30)

## 2020-07-20 LAB — SALICYLATE LEVEL: Salicylate Lvl: <7 mg/dL — ABNORMAL LOW (ref 7.0–30.0)

## 2020-07-20 LAB — CBG MONITORING, ED: Glucose-Capillary: 207 mg/dL — ABNORMAL HIGH (ref 70–99)

## 2020-07-20 MED ORDER — PIOGLITAZONE HCL 30 MG PO TABS: 30.0000 mg | ORAL_TABLET | Freq: Every day | ORAL | 0 refills | Status: DC

## 2020-07-20 MED ORDER — LORAZEPAM 2 MG/ML IJ SOLN: 0.0000 mg | Freq: Four times a day (QID) | INTRAMUSCULAR | Status: DC

## 2020-07-20 MED ORDER — LOSARTAN POTASSIUM-HCTZ 100-25 MG PO TABS: 1.0000 | ORAL_TABLET | Freq: Every day | ORAL | 0 refills | Status: DC

## 2020-07-20 MED ORDER — AMLODIPINE BESYLATE 5 MG PO TABS: 5.0000 mg | ORAL_TABLET | Freq: Every day | ORAL | 0 refills | Status: DC

## 2020-07-20 MED ORDER — LORAZEPAM 1 MG PO TABS: 0.0000 mg | ORAL_TABLET | Freq: Four times a day (QID) | ORAL | Status: DC

## 2020-07-20 MED ORDER — LORAZEPAM 1 MG PO TABS: 0.0000 mg | ORAL_TABLET | Freq: Two times a day (BID) | ORAL | Status: DC

## 2020-07-20 MED ORDER — METFORMIN HCL 1000 MG PO TABS: 1000.0000 mg | ORAL_TABLET | Freq: Two times a day (BID) | ORAL | 0 refills | Status: DC

## 2020-07-20 MED ORDER — GLIPIZIDE ER 10 MG PO TB24: 10.0000 mg | ORAL_TABLET | Freq: Every day | ORAL | 0 refills | Status: DC

## 2020-07-20 MED ORDER — THIAMINE HCL 100 MG PO TABS
100.0000 mg | ORAL_TABLET | Freq: Every day | ORAL | Status: DC
  Administered 2020-07-20: 100 mg via ORAL
  Filled 2020-07-20: qty 1

## 2020-07-20 MED ORDER — THIAMINE HCL 100 MG/ML IJ SOLN: 100.0000 mg | Freq: Every day | INTRAMUSCULAR | Status: DC

## 2020-07-20 MED ORDER — LORAZEPAM 2 MG/ML IJ SOLN: 0.0000 mg | Freq: Two times a day (BID) | INTRAMUSCULAR | Status: DC

## 2020-07-20 NOTE — ED Triage Notes (Signed)
EMS stated, he is wantig help for detox of alcohol and crack / cocaine. Has not took his medication in a month, cause he is out.

## 2020-07-20 NOTE — TOC Initial Note (Addendum)
Transition of Care [facility]) - Initial/Assessment Note    Patient Details  Name: Duane Salazar MRN: [id number] Date of Birth: 07/26/1976  Transition of [facility]) CM/SW Contact:    Verdell Carmine, RN Phone Number: 07/20/2020, 10:58 AM  Clinical Narrative:                 Patient here for detox from cocaine, etoh MATCH placed for regular medications. Is a patient of community health and wellness referred to [facility] working with [place]. [place] letter will be given to patient, [facility]. Patient will need to pick these up and then proceed to [facility] for self presentation.   [place] they are available and ready for self presentation of patient.  [facility]. MATCH overridden due to patient stating he had no money to pay for medications.  Had to wait as the system did not initially have all the medications through [facility]. Rn getting patient ready for discharge Using [place] transportation to get patient transported to [facility] in [place].    Barriers to Discharge: Active Substance Use - Placement, Inadequate or no insurance   Patient Goals and CMS Choice        Expected Discharge Plan and Services                                                Prior Living Arrangements/Services                       Activities of Daily Living      Permission Sought/Granted                  Emotional Assessment              Admission diagnosis:  detox Patient Active Problem List   Diagnosis Date Noted  . Schizoaffective disorder ([place]) 02/11/2020  . Schizophrenia ([place]) 02/10/2020  . Hep C w/o coma, chronic ([place]) 10/21/2015  . Injury of right Achilles tendon 10/17/2015  . Tinea pedis 10/17/2015  . Onychomycosis of toenail 10/17/2015  . 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 Alcohol use disorder, mild, abuse  09/18/2015  . Cannabis use disorder, mild, abuse 09/18/2015  . Opioid use disorder, moderate, dependence ([place]) 09/18/2015  . Tobacco use disorder 09/18/2015  . Cocaine use disorder, severe, dependence ([place]) 09/16/2015  . Schizoaffective disorder, depressive type ([place])   . Suicidal ideation 05/07/2015  . 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 Chronic pain syndrome 03/11/2015  . 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 Chronic headache 01/02/2015  . 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 HTN (hypertension) 10/21/2014  . Morbid obesity with BMI of 40.0-44.9, adult ([place]) 10/21/2014  . Sleep apnea   . 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[place])   . Arthritis   . Diabetes type 2, uncontrolled ([place])   . Adrenal insufficiency ([place]) 12/23/2005   PCP:  Antony Blackbird, MD Pharmacy:   [facility], [place] [address] [place] [address] Phone: [phone] Fax: [phone]     Social Determinants of Health (SDOH) Interventions    Readmission Risk Interventions No flowsheet data found.

## 2020-07-20 NOTE — TOC Progression Note (Signed)
Transition of Care [facility]) - Progression Note    Patient Details  Name: Duane Salazar MRN: [id number] Date of Birth: [birth year]  Transition of Care [facility]) CM/SW [place], RN Phone Number: 07/20/2020, 3:36 PM  Clinical Narrative:    Patient missed first ride ordered, stayed with him until second ride came to pick up. Mother called to check on him, as he was leaving he gave me permission to speak to her.   His mother stated that she has been advocating for him, she doesn't understand why he cannot stay in treatment longer.  He has a brain tumor, he was turned down from [place]. She did appeal.  How can he be left homeless. ?  I asked her if she could take him in, and she said she coulld not. He has noone that can take him in. I explained how the system works, and how follow up is important, she stated he will not remember to folow up with PCP, ( suggested [facility]. ). He needs to get his medicine straight. She stated she was going to the newspaper tomorrow since noone would help her child. We talked about responsibility for ones actions. She talked about him being at a 4th grade level and being incarcerated for several years.  I gave her resources to [facility], [employer]. Unfortunately, there is a population of homeless in our area, and we do not have resources for housing. We cannot house people at the hospital. I looked up this brain tumor, he had a pituitary tumor excised in 2017.   I will refer him to [facility] , he can reapply to medicaid     Barriers to Discharge: Active Substance Use - Placement, Inadequate or no insurance  Expected Discharge Plan and Services                                                 Social Determinants of Health (SDOH) Interventions    Readmission Risk Interventions No flowsheet data found.

## 2020-07-20 NOTE — TOC Progression Note (Signed)
Transition of Care [facility]) - Progression Note    Patient Details  Name: Duane Salazar MRN: [id number] Date of Birth: 08-Jan-1976  Transition of Care [facility]) CM/SW [place], [place] Phone Number: 07/20/2020, 11:26 AM  Clinical Narrative: CSW met with patient and review SA resources in the community. Patient reports he has been using crack/cocaine daily for several years and has been drinking alcohol 2-3 times a week. Patient denied any ETOH related withdrawal symptoms. Patient reported he also needed help with his schizophrenia. Patient reports he has been off of his medications for it for several months or longer. Patient denied any SI/HI, or AVH. CSW reviewed resources and attempted to reach out to [facility] to see if they were taking walk-ins. [facility] [place] stated no, no answer at [facility]. CSW reviewed with RNCM and noted RNCM was able to reach their detox and they confirmed they were taking walk-ins today.         Barriers to Discharge: Active Substance Use - Placement, Inadequate or no insurance  Expected Discharge Plan and Services                                                 Social Determinants of Health (SDOH) Interventions    Readmission Risk Interventions No flowsheet data found.

## 2020-07-20 NOTE — Discharge Instructions (Signed)
                  Intensive Outpatient Programs  [facility]    [facility] [address]     [address] [place],  [place]     [place], [place] [phone]      [phone]  [facility]   [facility]  (Inpatient and outpatient)  [phone] (Suboxone and Methadone) [address]           [phone]           [facility]: [facility] Drug Services    [facility] - Intensive Outpatient [address]     [address] [place], [place] [address]     [place], [place]  [phone]      [phone]  [facility] (Outpatient, Inpatient, [facility]  [facility] (Groups and [facility]) (insurance only) [phone]    [place], [place]          [phone]       [facility]    [facility] (for caregivers and family) [address]     [address] [place], [place]     [place], [place] [phone]      [phone]  Residential Treatment Programs  [facility]  [place] [facility](2 years) Residential: 90 days)  [facility] [address]      [address] [place], [place]     [place], [place] [phone]      [phone] or [phone]  [facility]    [facility] [address]      [address] [place], [place]     [place], [place] [phone]      [phone]  [facility]   [facility] ([facility]) [address]     [address] [place], [place] [address]     [place], [place] [phone]      [phone] Admissions: 8am-3pm M-F  [facility] Program: Residential Program (90 Days)              [facility]: [facility]  [place], [place]     [place], [place]  [phone] or [phone]    (Walk in Hours over the weekend or by referral)   [place] [facility]: [facility]:[phone] (for crisis  response 24 hours a day) 

## 2020-07-20 NOTE — ED Provider Notes (Signed)
[facility] EMERGENCY DEPARTMENT Provider Note   CSN: [id number] Arrival date & time: 07/20/20  9937     History Chief Complaint  Patient presents with  . Addiction Problem  . Mental Health Problem  . Medication Refill  . Suicidal    Duane Salazar is a 44 y.o. male with a past medical history of polysubstance abuse, schizophrenia, diabetes presenting to the ED with a chief complaint of requesting detox.  States that he has been out of all of his medications for the past few months and has been "running the streets and doing all kinds of drugs."  He reports powder cocaine use and crack cocaine use with last use being approximately 5 hours ago.  Reports daily marijuana use as well.  Also states that he has been drinking alcohol daily, reports "two 40s every day."  Last use of this was approximately 8 hours ago.  Has not tried detoxing from alcohol before and denies any alcohol withdrawal symptoms in the past.  He denies any chest pain, abdominal pain, vomiting, shortness of breath, injuries or falls.  Does state that he is having pain related to his neuropathy since being out of his medications.  Denies any SI, HI, AVH.  HPI     Past Medical History:  Diagnosis Date  . 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 AKI (acute kidney injury) ([place]) 03/21/2015  . Arthritis Dx 2002  . Diabetes type 2, controlled ([place]) 2010  . Drug abuse ([place])   . Headache   . Hypertension Dx 2002  . 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 Schizo affective schizophrenia ([place]) 1995  . Schizophrenia ([place])   . Seizures ([place])   . Sleep apnea 1995   on CPAP    Patient Active Problem List   Diagnosis Date Noted  . Schizoaffective disorder ([place]) 02/11/2020  . Schizophrenia ([place]) 02/10/2020  . Hep C w/o coma, chronic ([place]) 10/21/2015  . Injury of right Achilles tendon 10/17/2015  . Tinea pedis 10/17/2015  . Onychomycosis of toenail 10/17/2015  . 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 Alcohol  use disorder, mild, abuse 09/18/2015  . Cannabis use disorder, mild, abuse 09/18/2015  . Opioid use disorder, moderate, dependence ([place]) 09/18/2015  . Tobacco use disorder 09/18/2015  . Cocaine use disorder, severe, dependence ([place]) 09/16/2015  . Schizoaffective disorder, depressive type ([place])   . Suicidal ideation 05/07/2015  . 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 Chronic pain syndrome 03/11/2015  . Pituitary macroadenoma ([place]) 03/11/2015  . Chronic headache 01/02/2015  . 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 HTN (hypertension) 10/21/2014  . Morbid obesity with BMI of 40.0-44.9, adult ([place]) 10/21/2014  . Sleep apnea   . 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[place])   . Arthritis   . Diabetes type 2, uncontrolled ([place])   . Adrenal insufficiency ([place]) 12/23/2005    Past Surgical History:  Procedure Laterality Date  . PITUITARY SURGERY  2005 & 2012  . SKIN BIOPSY         Family History  Problem Relation Age of Onset  . Hypertension Mother   . Diabetes Mother   . Heart Problems Mother   . Cancer Maternal Uncle   . Alcoholism Maternal Uncle   . Cancer Maternal Grandmother   . Heart disease Neg Hx     Social History   Tobacco Use  . Smoking status: Current Every Day Smoker    Packs/day: 1.00    Years: 20.00    Pack years: 20.00  Types: Cigarettes  . Smokeless tobacco: Never Used  . Tobacco comment: Smoking .5 ppd  Substance Use Topics  . Alcohol use: Yes    Alcohol/week: 2.0 standard drinks    Types: 2 Cans of beer per week    Comment: 2 40s a day  . Drug use: Yes    Types: Cocaine, Marijuana, Heroin    Comment: Reports used 3 weeks ago. 10/17/2015    Home Medications Prior to Admission medications   Medication Sig Start Date End Date Taking? Authorizing Provider  gabapentin (NEURONTIN) 600 MG tablet Take 600 mg by mouth 3 (three) times daily.   Yes [provider]  amLODipine (NORVASC) 5 MG tablet Take 1 tablet (5 mg total) by mouth  daily. 07/20/20   Sebron Mcmahill, PA-C  benztropine (COGENTIN) 1 MG tablet Take 1 tablet (1 mg total) by mouth 2 (two) times daily. 06/25/20   [place] Crane, MD  glipiZIDE (GLUCOTROL XL) 10 MG 24 hr tablet Take 1 tablet (10 mg total) by mouth daily with breakfast. 07/20/20   Heloise Gordan, PA-C  losartan-hydrochlorothiazide (HYZAAR) 100-25 MG tablet Take 1 tablet by mouth daily. To lower blood pressure 07/20/20   Tremell Reimers, PA-C  metFORMIN (GLUCOPHAGE) 1000 MG tablet Take 1 tablet (1,000 mg total) by mouth 2 (two) times daily with a meal. 07/20/20   Jceon Alverio, PA-C  pioglitazone (ACTOS) 30 MG tablet Take 1 tablet (30 mg total) by mouth daily. 07/20/20   Lashunta Frieden, PA-C  risperiDONE (RISPERDAL) 1 MG tablet Take 1 tablet (1 mg total) by mouth 2 (two) times daily. 06/25/20   [place] Crane, MD    Allergies    Patient has no known allergies.  Review of Systems   Review of Systems  Constitutional: Negative for appetite change, chills and fever.  HENT: Negative for ear pain, rhinorrhea, sneezing and sore throat.   Eyes: Negative for photophobia and visual disturbance.  Respiratory: Negative for cough, chest tightness, shortness of breath and wheezing.   Cardiovascular: Negative for chest pain and palpitations.  Gastrointestinal: Negative for abdominal pain, blood in stool, constipation, diarrhea, nausea and vomiting.  Genitourinary: Negative for dysuria, hematuria and urgency.  Musculoskeletal: Positive for myalgias.  Skin: Negative for rash.  Neurological: Negative for dizziness, weakness and light-headedness.    Physical Exam Updated Vital Signs BP (!) 134/91   Pulse 80   Temp 98.2 F (36.8 C) (Oral)   Resp (!) 23   SpO2 100%   Physical Exam Vitals and nursing note reviewed.  Constitutional:      General: He is not in acute distress.    Appearance: He is well-developed.  HENT:     Head: Normocephalic and atraumatic.     Nose: Nose normal.  Eyes:     General: No scleral  icterus.       Right eye: No discharge.        Left eye: No discharge.     Conjunctiva/sclera: Conjunctivae normal.     Pupils: Pupils are equal, round, and reactive to light.  Cardiovascular:     Rate and Rhythm: Normal rate and regular rhythm.     Heart sounds: Normal heart sounds. No murmur heard.  No friction rub. No gallop.   Pulmonary:     Effort: Pulmonary effort is normal. No respiratory distress.     Breath sounds: Normal breath sounds.  Abdominal:     General: Bowel sounds are normal. There is no distension.     Palpations: Abdomen is soft.  Tenderness: There is no abdominal tenderness. There is no guarding.  Musculoskeletal:        General: Normal range of motion.     Cervical back: Normal range of motion and neck supple.  Skin:    General: Skin is warm and dry.     Findings: No rash.  Neurological:     Mental Status: He is alert and oriented to person, place, and time.     Cranial Nerves: No cranial nerve deficit.     Motor: No weakness or abnormal muscle tone.     Coordination: Coordination normal.     Comments: No tremors noted.     ED Results / Procedures / Treatments   Labs (all labs ordered are listed, but only abnormal results are displayed) Labs Reviewed  COMPREHENSIVE METABOLIC PANEL - Abnormal; Notable for the following components:      Result Value   Glucose, Bld 213 (*)    AST 71 (*)    ALT 70 (*)    Total Bilirubin 1.4 (*)    All other components within normal limits  SALICYLATE LEVEL - Abnormal; Notable for the following components:   Salicylate Lvl <4.0 (*)    All other components within normal limits  ACETAMINOPHEN LEVEL - Abnormal; Notable for the following components:   Acetaminophen (Tylenol), Serum <10 (*)    All other components within normal limits  CBC - Abnormal; Notable for the following components:   RBC 6.46 (*)    MCV 75.2 (*)    MCH 23.8 (*)    All other components within normal limits  RAPID URINE DRUG SCREEN, HOSP  PERFORMED - Abnormal; Notable for the following components:   Cocaine POSITIVE (*)    Tetrahydrocannabinol POSITIVE (*)    All other components within normal limits  CBG MONITORING, ED - Abnormal; Notable for the following components:   Glucose-Capillary 207 (*)    All other components within normal limits    EKG None  Radiology No results found.  Procedures Procedures (including critical care time)  Medications Ordered in ED Medications  LORazepam (ATIVAN) injection 0-4 mg (has no administration in time range)    Or  LORazepam (ATIVAN) tablet 0-4 mg (has no administration in time range)  LORazepam (ATIVAN) injection 0-4 mg (has no administration in time range)    Or  LORazepam (ATIVAN) tablet 0-4 mg (has no administration in time range)  thiamine tablet 100 mg (has no administration in time range)    Or  thiamine (B-1) injection 100 mg (has no administration in time range)    ED Course  I have reviewed the triage vital signs and the nursing notes.  Pertinent labs & imaging results that were available during my care of the patient were reviewed by me and considered in my medical decision making (see chart for details).  Clinical Course as of Jul 20 1118  Nancy Fetter Jul 20, 2020  0958 COCAINE(!): POSITIVE [HK]  1040 Tetrahydrocannabinol(!): POSITIVE [HK]  1040 AST(!): 71 [HK]  1040 Suspect in the setting of alcohol intake.  ALT(!): 70 [HK]    Clinical Course User Index [HK] Delia Heady, PA-C   MDM Rules/Calculators/A&P                          44 year old male presenting to the ED with request for detoxing from alcohol and drug use.  States that he has been out of all of his home medications for the past few months  and has been snorting cocaine, smoking crack cocaine, smoking marijuana and drinking alcohol daily.  He has not tried detoxing from alcohol in the past and denies any prior alcohol withdrawal symptoms.  Last drink was approximately 8 hours ago and last drug use  was approximately 5 hours ago.  He denies any symptoms at this point, denies any chest pain, shortness of breath, abdominal pain, vomiting, injuries or falls.  His only complaint is chronic neuropathy from being out of his gabapentin.  He is alert and oriented on exam.  No tremors noted.  We will need to obtain medical screening lab work, consult to peer support and transition of care for potential placement for rehab.  He is not suicidal or homicidal and is not hallucinating on my exam.  10:40 AM Medical screening lab work is unremarkable.  He has a UDS is positive for cocaine and THC as expected.  Slight elevation in AST and ALT which is most likely secondary to his alcohol use.  Will need to follow-up on recommendations from peer support and TOC.  11:03 AM I have refilled his home antihypertensives and diabetes medications.  I spoke to peer support specialist and Education officer, museum.  They have given him resources regarding follow-up for seeking treatment for detox.  Stressed importance of taking his home medications to control his chronic medical issues.  Patient has no other complaints at this time.  He does not appear to be withdrawing from alcohol.  His CIWA score was 2 for anxiety.  Patient is agreeable to the plan.  Return precautions given.   Patient is hemodynamically stable, in NAD, and able to ambulate in the ED. Evaluation does not show pathology that would require ongoing emergent intervention or inpatient treatment. I explained the diagnosis to the patient. Pain has been managed and has no complaints prior to discharge. Patient is comfortable with above plan and is stable for discharge at this time. All questions were answered prior to disposition. Strict return precautions for returning to the ED were discussed. Encouraged follow up with PCP.   An After Visit Summary was printed and given to the patient.   Portions of this note were generated with Lobbyist. Dictation errors  may occur despite best attempts at proofreading.  Final Clinical Impression(s) / ED Diagnoses Final diagnoses:  Substance abuse ([place])  Alcohol abuse  Encounter for medication refill    Rx / DC Orders ED Discharge Orders         Ordered    amLODipine (NORVASC) 5 MG tablet  Daily,   Status:  Discontinued        07/20/20 1059    glipiZIDE (GLUCOTROL XL) 10 MG 24 hr tablet  Daily with breakfast        07/20/20 1059    losartan-hydrochlorothiazide (HYZAAR) 100-25 MG tablet  Daily,   Status:  Discontinued        07/20/20 1059    metFORMIN (GLUCOPHAGE) 1000 MG tablet  2 times daily with meals,   Status:  Discontinued        07/20/20 1059    pioglitazone (ACTOS) 30 MG tablet  Daily,   Status:  Discontinued        07/20/20 1059    amLODipine (NORVASC) 5 MG tablet  Daily        07/20/20 1059    losartan-hydrochlorothiazide (HYZAAR) 100-25 MG tablet  Daily        07/20/20 1059    metFORMIN (GLUCOPHAGE) 1000 MG tablet  2  times daily with meals        07/20/20 1100    pioglitazone (ACTOS) 30 MG tablet  Daily        07/20/20 1100           Delia Heady, PA-C 07/20/20 1119    Noemi Chapel, MD 07/21/20 [phone]

## 2020-07-20 NOTE — ED Triage Notes (Signed)
Last used crack about 5 hours ago.

## 2020-07-28 ENCOUNTER — Telehealth: Payer: Self-pay | Admitting: Family Medicine

## 2020-07-28 ENCOUNTER — Other Ambulatory Visit: Payer: Self-pay | Admitting: Internal Medicine

## 2020-07-28 DIAGNOSIS — E1165 Type 2 diabetes mellitus with hyperglycemia: Secondary | ICD-10-CM

## 2020-07-28 DIAGNOSIS — I10 Essential (primary) hypertension: Secondary | ICD-10-CM

## 2020-07-28 MED ORDER — METFORMIN HCL 1000 MG PO TABS: 1000.0000 mg | ORAL_TABLET | Freq: Two times a day (BID) | ORAL | 0 refills | Status: DC

## 2020-07-28 MED ORDER — AMLODIPINE BESYLATE 5 MG PO TABS: 5.0000 mg | ORAL_TABLET | Freq: Every day | ORAL | 0 refills | Status: DC

## 2020-07-28 MED ORDER — GLIPIZIDE ER 10 MG PO TB24: 10.0000 mg | ORAL_TABLET | Freq: Every day | ORAL | 0 refills | Status: DC

## 2020-07-28 MED ORDER — PIOGLITAZONE HCL 30 MG PO TABS: 30.0000 mg | ORAL_TABLET | Freq: Every day | ORAL | 0 refills | Status: DC

## 2020-07-28 MED ORDER — LOSARTAN POTASSIUM-HCTZ 100-25 MG PO TABS: 1.0000 | ORAL_TABLET | Freq: Every day | ORAL | 0 refills | Status: DC

## 2020-07-28 NOTE — Telephone Encounter (Signed)
Will route to the covering provider to see if medications can be approved since patient has not had OV since June, 2021.  Care Coordinator highly request that medications be refilled so patient can go through rehab. He would loose his place otherwise.   Please advise Refill x 1 month.  Please send to [facility]

## 2020-07-28 NOTE — Telephone Encounter (Signed)
Coordinator called to ask the nurse to call her regarding getting refills for patient's medication fairly soon.  She is trying to get him into a program and he needs a 30 day supply of him meds on hand.  Please advise and call to discuss at [phone]

## 2020-07-28 NOTE — Telephone Encounter (Signed)
Needs refills ASAP . Pt will go to after care in the morning/ need meds prior to arrival. Returned call stated need refills on al his meds before placing into Detox center

## 2020-07-31 ENCOUNTER — Other Ambulatory Visit: Payer: Self-pay

## 2020-07-31 DIAGNOSIS — E1165 Type 2 diabetes mellitus with hyperglycemia: Secondary | ICD-10-CM

## 2020-07-31 DIAGNOSIS — I10 Essential (primary) hypertension: Secondary | ICD-10-CM

## 2020-07-31 MED ORDER — AMLODIPINE BESYLATE 5 MG PO TABS: 5.0000 mg | ORAL_TABLET | Freq: Every day | ORAL | 0 refills | Status: DC

## 2020-07-31 MED ORDER — LOSARTAN POTASSIUM-HCTZ 100-25 MG PO TABS: 1.0000 | ORAL_TABLET | Freq: Every day | ORAL | 0 refills | Status: DC

## 2020-07-31 MED ORDER — PIOGLITAZONE HCL 30 MG PO TABS: 30.0000 mg | ORAL_TABLET | Freq: Every day | ORAL | 0 refills | Status: DC

## 2020-07-31 MED ORDER — GLIPIZIDE ER 10 MG PO TB24: 10.0000 mg | ORAL_TABLET | Freq: Every day | ORAL | 0 refills | Status: DC

## 2020-07-31 MED ORDER — METFORMIN HCL 1000 MG PO TABS: 1000.0000 mg | ORAL_TABLET | Freq: Two times a day (BID) | ORAL | 0 refills | Status: DC

## 2020-07-31 MED FILL — LOSARTAN-HCTZ 100-25 MG TAB: 100-25 | 30 days supply | Qty: 30 | Fill #0

## 2020-07-31 MED FILL — glipiZIDE XL 10 MG TB24: 10 | 30 days supply | Qty: 30 | Fill #0

## 2020-07-31 MED FILL — METFORMIN HCL 1000 MG TABS: 1000 | 30 days supply | Qty: 60 | Fill #0

## 2020-07-31 MED FILL — PIOGLITAZONE HCL 30 MG TAB: 30 | 30 days supply | Qty: 30 | Fill #0

## 2020-07-31 MED FILL — AMLODIPINE BESYLATE 5 MG TA: 5 | 30 days supply | Qty: 30 | Fill #0

## 2020-07-31 NOTE — Telephone Encounter (Signed)
Mrs. Duane Salazar states who is coordinating Mr. Duane Salazar care called in requesting medication re-fills from Dr. Wynetta Emery. She states he needs medications to keep his place in the after care program. If patient does not have medicine by Sunday he will not be eligible to remain in the program. Mrs Duane Salazar states she needs the medicine filled and delivered to his program by tomorrow at 4:30pm. Please follow up.

## 2020-07-31 NOTE — Telephone Encounter (Signed)
Spoke with Ms. Cheek and she states [facility] it will be 149.00 to get medications. I have resent medications to the pharmacy here. Went and spoke with the pharmacy to see if they will be able to fill medications and they will. Returned call to Ms. Cheek and made her aware that we will fill medications and she can come pick them up tomorrow after lunch provided our address and she doesn't have any questions or concerns

## 2020-08-13 ENCOUNTER — Ambulatory Visit (HOSPITAL_COMMUNITY): Payer: No Payment, Other | Admitting: Psychiatry

## 2020-08-26 ENCOUNTER — Ambulatory Visit: Payer: Self-pay

## 2020-08-26 NOTE — Telephone Encounter (Signed)
Patient called stating that he has pain numbness in his left foot and sometimes going into his leg.  She states it is just left side.  He is diabetic hand has not been in office because he has just been release from a facility.  No appointment are available until January in office. He will go to [facility] for evaluation of acute problem. He was asked to call back after evaluation to schedule for physical.  He verbalized understanding and will follow plan of care.  Reason for Disposition . Numbness in one foot (i.e., loss of sensation)  Answer Assessment - Initial Assessment Questions 1. ONSET: "When did the pain start?"      3 weeks 2. LOCATION: "Where is the pain located?"   (e.g., around nail, entire toe, at foot joint)      Numb under botton of toe entire toe  3. PAIN: "How bad is the pain?"    (Scale 1-10; or mild, moderate, severe)   -  MILD (1-3): doesn't interfere with normal activities    -  MODERATE (4-7): interferes with normal activities (e.g., work or school) or awakens from sleep, limping    -  SEVERE (8-10): excruciating pain, unable to do any normal activities, unable to walk     Is walking 4. APPEARANCE: "What does the toe look like?" (e.g., redness, swelling, bruising, pallor)     Slight redness no swelling  5. CAUSE: "What do you think is causing the toe pain?"     Unsure diabetic 6. OTHER SYMPTOMS: "Do you have any other symptoms?" (e.g., leg pain, rash, fever, numbness)     Numbness to left leg feels painful 7. PREGNANCY: "Is there any chance you are pregnant?" "When was your last menstrual period?"    N/A  Protocols used: TOE PAIN-A-AH

## 2020-08-28 ENCOUNTER — Other Ambulatory Visit (HOSPITAL_COMMUNITY): Payer: Self-pay | Admitting: Emergency Medicine

## 2020-08-28 ENCOUNTER — Ambulatory Visit (HOSPITAL_COMMUNITY)
Admission: EM | Admit: 2020-08-28 | Discharge: 2020-08-28 | Disposition: A | Discharge status: Home / Self Care | Payer: Self-pay | Attending: Emergency Medicine | Admitting: Emergency Medicine

## 2020-08-28 ENCOUNTER — Other Ambulatory Visit: Payer: Self-pay

## 2020-08-28 ENCOUNTER — Encounter (HOSPITAL_COMMUNITY): Payer: Self-pay | Admitting: Emergency Medicine

## 2020-08-28 DIAGNOSIS — M792 Neuralgia and neuritis, unspecified: Secondary | ICD-10-CM

## 2020-08-28 DIAGNOSIS — E1142 Type 2 diabetes mellitus with diabetic polyneuropathy: Secondary | ICD-10-CM

## 2020-08-28 LAB — CBG MONITORING, ED: Glucose-Capillary: 99 mg/dL (ref 70–99)

## 2020-08-28 MED ORDER — FREESTYLE SYSTEM KIT: 1.0000 | PACK | Freq: Every day | 0 refills | Status: DC

## 2020-08-28 MED ORDER — GLUCOSE BLOOD VI STRP: ORAL_STRIP | 2 refills | Status: DC

## 2020-08-28 MED FILL — TRUE METRIX BLOOD GLUCOSE M: W/DEVICE | 30 days supply | Qty: 1 | Fill #0

## 2020-08-28 MED FILL — ?PREDNISONE 5 MG TABLET: 5 MG | 30 days supply | Qty: 45 | Fill #1

## 2020-08-28 MED FILL — GABAPENTIN 600 MG TABLET: 600 | 30 days supply | Qty: 120 | Fill #1

## 2020-08-28 MED FILL — TRUE METRIX GLUCOSE TEST ST: 50 days supply | Qty: 100 | Fill #0

## 2020-08-28 NOTE — ED Triage Notes (Signed)
Pt states that his left hand and both feet have a tingling sensation and also slight pain in his big toe on the right foot. The big toe on the left foot is numb and foot has shocking pains shooting through them. Pt states that he noticed his sx 30 days ago. [facility] but was told to come to [facility] today.

## 2020-08-28 NOTE — Discharge Instructions (Signed)
Check your sugar, twice a day once before breakfast and once before bedtime.  Keep a log of this and bring it with you to your next doctor's visit.  I believe that your pain is coming from diabetes.  There are medications that can help with this pain.  Follow-up with your doctor for further management.  Keep an eye on your feet-watch for any breaks in the skin.  These can become infected easily.

## 2020-08-28 NOTE — ED Provider Notes (Signed)
HPI  SUBJECTIVE:  Duane Salazar is a 44 y.o. male who presents with 1-1/2 years of left big toe, foot, leg numbness, shooting sharp pain in his foot, and pain/numbness in his right foot. states it got worse about a month ago while he was in an alcohol/drug rehabilitation facility. States he was discharged 2 days ago and has been clean since. No color changes, foot swelling, fevers, ulcers, cuts. He states that he has been compliant with his metformin over the past 8 months. He has a past medical history of diabetes, does not check his glucose as he is unable to find his glucometer. No polyuria, polydipsia, unintentional weight loss. He tried an unknown pain cream without improvement in his symptoms. Symptoms are worse with walking and when he takes the weight off of his feet. He has a past medical history of uncontrolled diabetes, last hemoglobin A1c 7.5, polysubstance abuse, schizoaffective disorder, hypertension, Addison's disease, hepatitis C, seizures, osteoarthritis. He is a smoker. No history of chronic kidney disease, DKA, peripheral arterial disease, peripheral vascular disease. YFV:CBSW, Cammie, MD (Inactive)     Past Medical History:  Diagnosis Date  . 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 AKI (acute kidney injury) ([place]) 03/21/2015  . Arthritis Dx 2002  . Diabetes type 2, controlled ([place]) 2010  . Drug abuse ([place])   . Headache   . Hypertension Dx 2002  . 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 Schizo affective schizophrenia ([place]) 1995  . Schizophrenia ([place])   . Seizures ([place])   . Sleep apnea 1995   on CPAP    Past Surgical History:  Procedure Laterality Date  . PITUITARY SURGERY  2005 & 2012  . SKIN BIOPSY      Family History  Problem Relation Age of Onset  . Hypertension Mother   . Diabetes Mother   . Heart Problems Mother   . Cancer Maternal Uncle   . Alcoholism Maternal Uncle   . Cancer Maternal Grandmother   . Heart disease Neg Hx     Social History   Tobacco Use  . Smoking  status: Current Every Day Smoker    Packs/day: 1.00    Years: 20.00    Pack years: 20.00    Types: Cigarettes  . Smokeless tobacco: Never Used  . Tobacco comment: Smoking .5 ppd  Substance Use Topics  . Alcohol use: Yes    Alcohol/week: 2.0 standard drinks    Types: 2 Cans of beer per week    Comment: 2 40s a day  . Drug use: Yes    Types: Cocaine, Marijuana, Heroin    Comment: Reports used 3 weeks ago. 10/17/2015    No current facility-administered medications for this encounter.  Current Outpatient Medications:  .  amLODipine (NORVASC) 5 MG tablet, Take 1 tablet (5 mg total) by mouth daily., Disp: 30 tablet, Rfl: 0 .  glucose blood test strip, Check your sugar in the morning before you eat breakfast, and one hour after a meal., Disp: 100 each, Rfl: 2 .  glucose monitoring kit (FREESTYLE) monitoring kit, 1 each by Does not apply route daily. Check glucose once in the morning before breakfast and 1 hour after a meal, Disp: 1 each, Rfl: 0 .  losartan-hydrochlorothiazide (HYZAAR) 100-25 MG tablet, Take 1 tablet by mouth daily. To lower blood pressure, Disp: 30 tablet, Rfl: 0 .  metFORMIN (GLUCOPHAGE) 1000 MG tablet, Take 1 tablet (1,000 mg total) by mouth 2 (two) times daily with a meal., Disp: 60 tablet, Rfl: 0 .  pioglitazone (ACTOS) 30 MG tablet, Take 1 tablet (30 mg total) by mouth daily., Disp: 30 tablet, Rfl: 0 .  benztropine (COGENTIN) 1 MG tablet, Take 1 tablet (1 mg total) by mouth 2 (two) times daily., Disp: 60 tablet, Rfl: 1 .  gabapentin (NEURONTIN) 600 MG tablet, Take 600 mg by mouth 3 (three) times daily., Disp: , Rfl:  .  glipiZIDE (GLUCOTROL XL) 10 MG 24 hr tablet, Take 1 tablet (10 mg total) by mouth daily with breakfast., Disp: 30 tablet, Rfl: 0 .  risperiDONE (RISPERDAL) 1 MG tablet, Take 1 tablet (1 mg total) by mouth 2 (two) times daily., Disp: 60 tablet, Rfl: 1  No Known Allergies   ROS  As noted in HPI.   Physical Exam  BP 118/86 (BP Location: Left Arm)    Pulse 86   Temp (!) 97.5 F (36.4 C) (Oral)   Resp 17   SpO2 98%   Constitutional: Well developed, well nourished, no acute distress Eyes:  EOMI, conjunctiva normal bilaterally HENT: Normocephalic, atraumatic,mucus membranes moist Respiratory: Normal inspiratory effort Cardiovascular: Normal rate GI: nondistended skin: No rash, skin intact over entire feet and between toes bilaterally no ulcers, erosions, fissures. Musculoskeletal: no deformities.  No foot erythema, edema, increased temperature, decreased temperature.  DP 2+ bilaterally.  Cap refill in all toes less than 2 seconds.  Tenderness at the plantar surface of the first MTP.  No erythema, swelling.  No other tenderness at the MTP.  No tenderness over the tarsals, metatarsals.  Decreased but intact sensation to temperature and light touch in the left foot compared to the right. Neurologic: Alert & oriented x 3, no focal neuro deficits Psychiatric: Speech and behavior appropriate   ED Course   Medications - No data to display  Orders Placed This Encounter  Procedures  . POC CBG monitoring    Standing Status:   Standing    Number of Occurrences:   1    Results for orders placed or performed during the hospital encounter of 08/28/20 (from the past 24 hour(s))  POC CBG monitoring     Status: None   Collection Time: 08/28/20 10:11 AM  Result Value Ref Range   Glucose-Capillary 99 70 - 99 mg/dL   No results found.  ED Clinical Impression  1. Neuropathic pain   2. Diabetic polyneuropathy associated with type 2 diabetes mellitus [facility])      ED Assessment/Plan  Last A1c on 01/31/2020 7.8.  No evidence of infection, acute arterial or vascular compromise.  Skin is intact.  Glucose here is 99.  Suspect symptoms are coming from peripheral neuropathy due to diabetes.  Will send home with a glucometer and strips as he states that he cannot find his.  Advised him to keep a log of his glucose and follow-up with his primary care  physician for further management of his diabetes and for medications to help with the neuropathic pain.  Discussed labs, , MDM, treatment plan, and plan for follow-up with patient.. patient agrees with plan.   Meds ordered this encounter  Medications  . glucose monitoring kit (FREESTYLE) monitoring kit    Sig: 1 each by Does not apply route daily. Check glucose once in the morning before breakfast and 1 hour after a meal    Dispense:  1 each    Refill:  0  . glucose blood test strip    Sig: Check your sugar in the morning before you eat breakfast, and one hour after a meal.  Dispense:  100 each    Refill:  2    *This clinic note was created using Lobbyist. Therefore, there may be occasional mistakes despite careful proofreading.   ?    Melynda Ripple, MD 08/29/20 [phone]

## 2020-09-10 ENCOUNTER — Telehealth (HOSPITAL_COMMUNITY): Payer: Self-pay | Admitting: *Deleted

## 2020-09-10 NOTE — Telephone Encounter (Signed)
Front desk notified me earlier today that patient was calling wanting to come in for an injection appt. He has not been on a shot in a bit so unclear what he wants. Told her to tell hi I would consult dr and call him with information. Spoke with Dr Toy Care and she has a scheduled appt with him on 2/7 will speak with him about it then as its not clear from his last inpatient contact if he wants to go on Invega or Haldol. Haldol is what the hospital had him on but when we saw him on Haldol he didn't like it and wanted a change. Tried to call him on mobile number, no answer and voice mail not set up for me to leave a number. Called the home number and no answer.

## 2020-09-17 NOTE — Progress Notes (Deleted)
Patient ID: Duane Salazar, male   DOB: 1976-08-03, 45 y.o.   MRN: [id number] SUBJECTIVE:  Duane Salazar is a 45 y.o. male who presents with 1-1/2 years of left big toe, foot, leg numbness, shooting sharp pain in his foot, and pain/numbness in his right foot. states it got worse about a month ago while he was in an alcohol/drug rehabilitation facility. States he was discharged 2 days ago and has been clean since. No color changes, foot swelling, fevers, ulcers, cuts. He states that he has been compliant with his metformin over the past 8 months. He has a past medical history of diabetes, does not check his glucose as he is unable to find his glucometer. No polyuria, polydipsia, unintentional weight loss. He tried an unknown pain cream without improvement in his symptoms. Symptoms are worse with walking and when he takes the weight off of his feet. He has a past medical history of uncontrolled diabetes, last hemoglobin A1c 7.5, polysubstance abuse, schizoaffective disorder, hypertension, Addison's disease, hepatitis C, seizures, osteoarthritis. He is a smoker. No history of chronic kidney disease, DKA, peripheral arterial disease, peripheral vascular disease  Last A1c on 01/31/2020 7.8.  No evidence of infection, acute arterial or vascular compromise.  Skin is intact.  Glucose here is 99.  Suspect symptoms are coming from peripheral neuropathy due to diabetes.  Will send home with a glucometer and strips as he states that he cannot find his.  Advised him to keep a log of his glucose and follow-up with his primary care physician for further management of his diabetes and for medications to help with the neuropathic pain.  Discussed labs, , MDM, treatment plan, and plan for follow-up with patient.. patient agrees with plan.

## 2020-09-18 ENCOUNTER — Ambulatory Visit: Payer: Self-pay | Admitting: Physician Assistant

## 2020-09-18 DIAGNOSIS — E1165 Type 2 diabetes mellitus with hyperglycemia: Secondary | ICD-10-CM

## 2020-10-06 ENCOUNTER — Ambulatory Visit (HOSPITAL_COMMUNITY): Payer: No Payment, Other | Admitting: Psychiatry

## 2020-10-27 ENCOUNTER — Telehealth: Payer: Self-pay | Admitting: Internal Medicine

## 2020-10-27 ENCOUNTER — Ambulatory Visit: Payer: Self-pay

## 2020-10-27 NOTE — Telephone Encounter (Signed)
FYI

## 2020-10-27 NOTE — Telephone Encounter (Signed)
Copied from [place] [id number]. Topic: General - Other >> Oct 27, 2020 11:19 AM Pawlus, Brayton Layman A wrote: Reason for CRM: Needs an orange card / financial appt. Please CB.

## 2020-10-27 NOTE — Telephone Encounter (Signed)
Pt. States 1 month ago he started having numbness and tingling in hands and feet.Reports the grip in his hands is very weak. Reports it has become constant now. He is diabetic. No other symptoms.Would like an appointment. No answer in the practice. Please advise pt.  Answer Assessment - Initial Assessment Questions 1. SYMPTOM: "What is the main symptom you are concerned about?" (e.g., weakness, numbness)     Numbness in hands and feet 2. ONSET: "When did this start?" (minutes, hours, days; while sleeping)     1 month ago 3. LAST NORMAL: "When was the last time you were normal (no symptoms)?"     2 months ago 4. PATTERN "Does this come and go, or has it been constant since it started?"  "Is it present now?"     Constant 5. CARDIAC SYMPTOMS: "Have you had any of the following symptoms: chest pain, difficulty breathing, palpitations?"     No 6. NEUROLOGIC SYMPTOMS: "Have you had any of the following symptoms: headache, dizziness, vision loss, double vision, changes in speech, unsteady on your feet?"     Weakness in hands 7. OTHER SYMPTOMS: "Do you have any other symptoms?"     No 8. PREGNANCY: "Is there any chance you are pregnant?" "When was your last menstrual period?"     n/a  Protocols used: NEUROLOGIC DEFICIT-A-AH

## 2020-10-27 NOTE — Telephone Encounter (Signed)
Duane Salazar Please contact pt and schedule an appt with any available provider or refer pt to mobile bus

## 2020-10-27 NOTE — Telephone Encounter (Signed)
Call placed to patient x2. No answer and unable to LVM because VMF.

## 2020-10-28 NOTE — Telephone Encounter (Signed)
I return Pt call, VM is full unable to LVM

## 2020-11-05 ENCOUNTER — Telehealth: Payer: Self-pay | Admitting: Internal Medicine

## 2020-11-05 ENCOUNTER — Ambulatory Visit: Payer: Self-pay

## 2020-11-05 NOTE — Telephone Encounter (Signed)
Copied from [place] [id number]. Topic: General - Other >> Nov 05, 2020 11:53 AM Erick Blinks wrote: Reason for CRM: Pt is requesting to speak to [facility] regarding his orange card  Best contact: [phone]

## 2020-11-05 NOTE — Telephone Encounter (Signed)
I return Pt call, he was explain what he need to bring to the appt for 11/05/20

## 2020-11-27 ENCOUNTER — Ambulatory Visit (INDEPENDENT_AMBULATORY_CARE_PROVIDER_SITE_OTHER): Payer: Self-pay

## 2020-11-27 ENCOUNTER — Encounter (HOSPITAL_COMMUNITY): Payer: Self-pay | Admitting: Emergency Medicine

## 2020-11-27 ENCOUNTER — Ambulatory Visit: Payer: Self-pay | Admitting: Internal Medicine

## 2020-11-27 ENCOUNTER — Ambulatory Visit (HOSPITAL_COMMUNITY)
Admission: EM | Admit: 2020-11-27 | Discharge: 2020-11-27 | Disposition: A | Discharge status: Home / Self Care | Payer: Self-pay | Attending: Emergency Medicine | Admitting: Emergency Medicine

## 2020-11-27 ENCOUNTER — Other Ambulatory Visit: Payer: Self-pay

## 2020-11-27 DIAGNOSIS — M79644 Pain in right finger(s): Secondary | ICD-10-CM

## 2020-11-27 DIAGNOSIS — M19041 Primary osteoarthritis, right hand: Secondary | ICD-10-CM

## 2020-11-27 MED ORDER — DICLOFENAC SODIUM 1 % EX GEL: 2.0000 g | Freq: Four times a day (QID) | CUTANEOUS | 1 refills | Status: DC

## 2020-11-27 MED ORDER — IBUPROFEN 600 MG PO TABS
600.0000 mg | ORAL_TABLET | Freq: Three times a day (TID) | ORAL | 1 refills | Status: DC
  Filled 2021-06-15 – 2021-06-22 (×2): qty 60, 20d supply, fill #0
  Filled 2021-08-03: qty 60, 20d supply, fill #1

## 2020-11-27 NOTE — ED Triage Notes (Signed)
Pt presents with pain to right 5th finger described as "throbbing" x 2 weeks. Denies injury.

## 2020-11-27 NOTE — ED Provider Notes (Signed)
[facility]    CSN: [id number] Arrival date & time: 11/27/20  1910      History   Chief Complaint Chief Complaint  Patient presents with  . Hand Pain    Right 5th    HPI Duane Salazar is a 45 y.o. male.   Patient presents with worsening pain on extension, decreased ROM and numbness of the right 5th finger for two weeks. Pain radiates down medial side of hand stopping before the wrist. Unable to flex finger. Beginning to feel similar symptoms on left 5th finger. Works with trees and as a Astronomer. Heat soothes pain. Has not attempted medication for treatment.   Past Medical History:  Diagnosis Date  . 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 AKI (acute kidney injury) ([place]) 03/21/2015  . Arthritis Dx 2002  . Diabetes type 2, controlled ([place]) 2010  . Drug abuse ([place])   . Headache   . Hypertension Dx 2002  . 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 Schizo affective schizophrenia ([place]) 1995  . Schizophrenia ([place])   . Seizures ([place])   . Sleep apnea 1995   on CPAP    Patient Active Problem List   Diagnosis Date Noted  . Schizoaffective disorder ([place]) 02/11/2020  . Schizophrenia ([place]) 02/10/2020  . Hep C w/o coma, chronic ([place]) 10/21/2015  . Injury of right Achilles tendon 10/17/2015  . Tinea pedis 10/17/2015  . Onychomycosis of toenail 10/17/2015  . 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 Alcohol use disorder, mild, abuse 09/18/2015  . Cannabis use disorder, mild, abuse 09/18/2015  . Opioid use disorder, moderate, dependence ([place]) 09/18/2015  . Tobacco use disorder 09/18/2015  . Cocaine use disorder, severe, dependence ([place]) 09/16/2015  . Schizoaffective disorder, depressive type ([place])   . Suicidal ideation 05/07/2015  . 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 Chronic pain syndrome 03/11/2015  . 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 Chronic headache 01/02/2015  . 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 HTN  (hypertension) 10/21/2014  . Morbid obesity with BMI of 40.0-44.9, adult ([place]) 10/21/2014  . Sleep apnea   . 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[place])   . Arthritis   . Diabetes type 2, uncontrolled ([place])   . Adrenal insufficiency ([place]) 12/23/2005    Past Surgical History:  Procedure Laterality Date  . PITUITARY SURGERY  2005 & 2012  . SKIN BIOPSY         Home Medications    Prior to Admission medications   Medication Sig Start Date End Date Taking? Authorizing Provider  amLODipine (NORVASC) 5 MG tablet Take 1 tablet (5 mg total) by mouth daily. 07/31/20   Ladell Pier, MD  benztropine (COGENTIN) 1 MG tablet Take 1 tablet (1 mg total) by mouth 2 (two) times daily. 06/25/20   Nevada Crane, MD  gabapentin (NEURONTIN) 600 MG tablet Take 600 mg by mouth 3 (three) times daily.    [provider]  glipiZIDE (GLUCOTROL XL) 10 MG 24 hr tablet Take 1 tablet (10 mg total) by mouth daily with breakfast. 07/31/20   Ladell Pier, MD  glucose blood test strip Check your sugar in the morning before you eat breakfast, and one hour after a meal. 08/28/20   Melynda Ripple, MD  glucose monitoring kit (FREESTYLE) monitoring kit 1 each by Does not apply route daily. Check glucose once in the morning before breakfast and 1 hour after a meal 08/28/20   Melynda Ripple, MD  losartan-hydrochlorothiazide (HYZAAR) 100-25 MG tablet Take 1 tablet by  mouth daily. To lower blood pressure 07/31/20   Ladell Pier, MD  metFORMIN (GLUCOPHAGE) 1000 MG tablet Take 1 tablet (1,000 mg total) by mouth 2 (two) times daily with a meal. 07/31/20   Ladell Pier, MD  pioglitazone (ACTOS) 30 MG tablet Take 1 tablet (30 mg total) by mouth daily. 07/31/20   Ladell Pier, MD  risperiDONE (RISPERDAL) 1 MG tablet Take 1 tablet (1 mg total) by mouth 2 (two) times daily. 06/25/20   [place]ev[place] Crane, MD    Family History Family History  Problem Relation Age of Onset  . Hypertension Mother   . Diabetes Mother   . Heart  Problems Mother   . Cancer Maternal Uncle   . Alcoholism Maternal Uncle   . Cancer Maternal Grandmother   . Heart disease Neg Hx     Social History Social History   Tobacco Use  . Smoking status: Current Every Day Smoker    Packs/day: 1.00    Years: 20.00    Pack years: 20.00    Types: Cigarettes  . Smokeless tobacco: Never Used  . Tobacco comment: Smoking .5 ppd  Substance Use Topics  . Alcohol use: Yes    Alcohol/week: 2.0 standard drinks    Types: 2 Cans of beer per week    Comment: 2 40s a day  . Drug use: Yes    Types: Cocaine, Marijuana, Heroin    Comment: Reports used 3 weeks ago. 10/17/2015     Allergies   Patient has no known allergies.   Review of Systems Review of Systems  Constitutional: Negative.   Respiratory: Negative.   Cardiovascular: Negative.   Gastrointestinal: Negative.   Musculoskeletal: Positive for arthralgias. Negative for back pain, gait problem, joint swelling, myalgias, neck pain and neck stiffness.  Skin: Negative.   Neurological: Positive for numbness. Negative for dizziness, tremors, seizures, syncope, facial asymmetry, speech difficulty, weakness, light-headedness and headaches.     Physical Exam Triage Vital Signs ED Triage Vitals  Enc Vitals Group     BP 11/27/20 1929 (!) 145/97     Pulse Rate 11/27/20 1929 92     Resp 11/27/20 1929 20     Temp 11/27/20 1929 (!) 97.5 F (36.4 C)     Temp Source 11/27/20 1929 Oral     SpO2 11/27/20 1929 97 %     Weight --      Height --      Head Circumference --      Peak Flow --      Pain Score 11/27/20 1926 10     Pain Loc --      Pain Edu? --      Excl. in [place]? --    No data found.  Updated Vital Signs BP (!) 145/97 (BP Location: Left Arm)   Pulse 92   Temp (!) 97.5 F (36.4 C) (Oral)   Resp 20   SpO2 97%   Visual Acuity Right Eye Distance:   Left Eye Distance:   Bilateral Distance:    Right Eye Near:   Left Eye Near:    Bilateral Near:     Physical  Exam Constitutional:      Appearance: Normal appearance. He is normal weight.  HENT:     Head: Normocephalic.  Eyes:     Extraocular Movements: Extraocular movements intact.  Pulmonary:     Effort: Pulmonary effort is normal.  Musculoskeletal:     Right hand: Tenderness present. No swelling or lacerations. Decreased range  of motion.     Left hand: Normal.       Arms:     Cervical back: Normal range of motion.     Comments: Unable to actively flex finger, can be passively done, pain elicited with full extension of finger. Swelling noted over entirety of 5th right finger. Sensation decreased, described palpation as pressure and " couldn't really feel it"Tenderness of palmer medial side of hand   Skin:    General: Skin is warm and dry.  Neurological:     General: No focal deficit present.     Mental Status: He is alert and oriented to person, place, and time. Mental status is at baseline.  Psychiatric:        Mood and Affect: Mood normal.        Behavior: Behavior normal.        Thought Content: Thought content normal.        Judgment: Judgment normal.      UC Treatments / Results  Labs (all labs ordered are listed, but only abnormal results are displayed) Labs Reviewed - No data to display  EKG   Radiology No results found.  Procedures Procedures (including critical care time)  Medications Ordered in UC Medications - No data to display  Initial Impression / Assessment and Plan / UC Course  I have reviewed the triage vital signs and the nursing notes.  Pertinent labs & imaging results that were available during my care of the patient were reviewed by me and considered in my medical decision making (see chart for details).  Osteoarthritis of right little finger   1. Ibuprofen 600 mg tid 2. Diclofenac gel 2 g qid prn  3. Heating pad 15 minute intervals  4,. Follow up for no improvement in 2 weeks, increased pain, increased swelling  Final Clinical Impressions(s) /  UC Diagnoses   Final diagnoses:  None   Discharge Instructions   None    ED Prescriptions    None     PDMP not reviewed this encounter.   Hans Eden, NP 11/27/20 1955

## 2020-11-27 NOTE — Discharge Instructions (Addendum)
Take ibuprofen 600 mg with food three times a day with food  Can use gel on hands up to four times a day as needed for additional comfort   Can use heating pad for 15 minute intervals as needed  Can follow up if no improvement seen in 2 weeks, increased pain, increased swelling,

## 2020-11-29 ENCOUNTER — Other Ambulatory Visit: Payer: Self-pay

## 2021-02-07 ENCOUNTER — Emergency Department (HOSPITAL_COMMUNITY): Payer: Self-pay

## 2021-02-07 ENCOUNTER — Emergency Department (HOSPITAL_COMMUNITY)
Admission: EM | Admit: 2021-02-07 | Discharge: 2021-02-07 | Disposition: A | Discharge status: Home / Self Care | Payer: Self-pay | Attending: Emergency Medicine | Admitting: Emergency Medicine

## 2021-02-07 ENCOUNTER — Other Ambulatory Visit: Payer: Self-pay

## 2021-02-07 DIAGNOSIS — R739 Hyperglycemia, unspecified: Secondary | ICD-10-CM

## 2021-02-07 DIAGNOSIS — Z79899 Other long term (current) drug therapy: Secondary | ICD-10-CM | POA: Insufficient documentation

## 2021-02-07 DIAGNOSIS — F1010 Alcohol abuse, uncomplicated: Secondary | ICD-10-CM | POA: Insufficient documentation

## 2021-02-07 DIAGNOSIS — F17210 Nicotine dependence, cigarettes, uncomplicated: Secondary | ICD-10-CM | POA: Insufficient documentation

## 2021-02-07 DIAGNOSIS — R202 Paresthesia of skin: Secondary | ICD-10-CM | POA: Insufficient documentation

## 2021-02-07 DIAGNOSIS — I10 Essential (primary) hypertension: Secondary | ICD-10-CM | POA: Insufficient documentation

## 2021-02-07 DIAGNOSIS — Z7984 Long term (current) use of oral hypoglycemic drugs: Secondary | ICD-10-CM | POA: Insufficient documentation

## 2021-02-07 DIAGNOSIS — F1410 Cocaine abuse, uncomplicated: Secondary | ICD-10-CM | POA: Insufficient documentation

## 2021-02-07 DIAGNOSIS — Y900 Blood alcohol level of less than 20 mg/100 ml: Secondary | ICD-10-CM | POA: Insufficient documentation

## 2021-02-07 DIAGNOSIS — F1120 Opioid dependence, uncomplicated: Secondary | ICD-10-CM | POA: Insufficient documentation

## 2021-02-07 DIAGNOSIS — F1290 Cannabis use, unspecified, uncomplicated: Secondary | ICD-10-CM | POA: Insufficient documentation

## 2021-02-07 DIAGNOSIS — Z9114 Patient's other noncompliance with medication regimen: Secondary | ICD-10-CM | POA: Insufficient documentation

## 2021-02-07 DIAGNOSIS — E1165 Type 2 diabetes mellitus with hyperglycemia: Secondary | ICD-10-CM | POA: Insufficient documentation

## 2021-02-07 LAB — CBG MONITORING, ED: Glucose-Capillary: 137 mg/dL — ABNORMAL HIGH (ref 70–99)

## 2021-02-07 LAB — ETHANOL: Alcohol, Ethyl (B): <10 mg/dL (ref <10)

## 2021-02-07 NOTE — ED Provider Notes (Signed)
[facility] DEPT Provider Note   CSN: [id number] Arrival date & time: 02/07/21  8338     History Chief Complaint  Patient presents with   Shortness of Breath    Duane Salazar is a 45 y.o. male.  HPI Patient presents complaining of achiness all over, and a loss of sensation of his fingers and toes bilaterally.  He also states he has been awake for 2 days because he has been using crack cocaine, and has not taken his Haldol injection for several months.  He describes this as "a mental health problem."  He does not report any hallucinations, suicidality or homicidal intention.  He denies other illnesses at this time.  He states he lives with his mother and came here today by EMS.  There are no other known active modifying factors.    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   Schizo affective schizophrenia ([place]) 1995   Schizophrenia ([place])    Seizures ([place])    Sleep apnea 1995   on CPAP    Patient Active Problem List   Diagnosis Date Noted   Schizoaffective disorder ([place]) 02/11/2020   Schizophrenia ([place]) 02/10/2020   Hep C w/o coma, chronic ([place]) 10/21/2015   Injury of right Achilles tendon 10/17/2015   Tinea pedis 10/17/2015   Onychomycosis of toenail 10/17/2015   Secondary adrenal insufficiency ([place]) 10/17/2015   Alcohol use disorder, mild, abuse 09/18/2015   Cannabis use disorder, mild, abuse 09/18/2015   Opioid use disorder, moderate, dependence ([place]) 09/18/2015   Tobacco use disorder 09/18/2015   Cocaine use disorder, severe, dependence ([place]) 09/16/2015   Schizoaffective disorder, depressive type ([place])    Suicidal ideation 05/07/2015   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[facility])    Chronic pain syndrome 03/11/2015    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/11/2015   Chronic headache 01/02/2015   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/2015   HTN (hypertension) 10/21/2014   Morbid obesity with BMI of 40.0-44.9, adult ([place]) 10/21/2014   Sleep apnea    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[place])    Arthritis    Diabetes type 2, uncontrolled ([place])    Adrenal insufficiency ([place]) 12/23/2005    Past Surgical History:  Procedure Laterality Date   PITUITARY SURGERY  2005 & 2012   SKIN BIOPSY         Family History  Problem Relation Age of Onset   Hypertension Mother    Diabetes Mother    Heart Problems Mother    Cancer Maternal Uncle    Alcoholism Maternal Uncle    Cancer Maternal Grandmother    Heart disease Neg Hx     Social History   Tobacco Use   Smoking status: Every Day    Packs/day: 1.00    Years: 20.00    Pack years: 20.00    Types: Cigarettes   Smokeless tobacco: Never   Tobacco comments:    Smoking .5 ppd  Substance Use Topics   Alcohol use: Yes    Alcohol/week: 2.0 standard drinks    Types: 2 Cans of beer per week    Comment: 2 40s a day   Drug use: Yes    Types: Cocaine, Marijuana, Heroin    Comment: Reports used 3 weeks  ago. 10/17/2015    Home Medications Prior to Admission medications   Medication Sig Start Date End Date Taking? Authorizing Provider  amLODipine (NORVASC) 5 MG tablet TAKE 1 TABLET (5 MG TOTAL) BY MOUTH DAILY. 07/31/20 07/31/21  Ladell Pier, MD  benztropine (COGENTIN) 1 MG tablet Take 1 tablet (1 mg total) by mouth 2 (two) times daily. 06/25/20   [place]ev[place] Crane, MD  Blood Glucose Monitoring Suppl (TRUE METRIX METER) w/Device KIT USE AS DIRECTED IN THE MORNING AND 1 HOUR AFTER MEAL. 08/28/20 08/28/21  Melynda Ripple, MD  diclofenac Sodium (VOLTAREN) 1 % GEL Apply 2 g topically 4 (four) times daily. 11/27/20   White, Leitha Schuller, NP  gabapentin (NEURONTIN) 600 MG tablet Take 600 mg by mouth 3 (three) times daily.    [provider]  glipiZIDE (GLUCOTROL  XL) 10 MG 24 hr tablet TAKE 1 TABLET (10 MG TOTAL) BY MOUTH DAILY WITH BREAKFAST. 07/31/20 07/31/21  Ladell Pier, MD  glucose blood test strip CHECK YOUR SUGAR IN THE MORNING BEFORE YOU EAT BREAKFAST, AND ONE HOUR AFTER A MEAL. 08/28/20 08/28/21  Melynda Ripple, MD  glucose monitoring kit (FREESTYLE) monitoring kit 1 each by Does not apply route daily. Check glucose once in the morning before breakfast and 1 hour after a meal 08/28/20   Melynda Ripple, MD  ibuprofen (ADVIL) 600 MG tablet Take 1 tablet (600 mg total) by mouth 3 (three) times daily. 11/27/20   White, Leitha Schuller, NP  losartan-hydrochlorothiazide (HYZAAR) 100-25 MG tablet TAKE 1 TABLET BY MOUTH DAILY. TO LOWER BLOOD PRESSURE 07/31/20 07/31/21  Ladell Pier, MD  metFORMIN (GLUCOPHAGE) 1000 MG tablet TAKE 1 TABLET (1,000 MG TOTAL) BY MOUTH 2 (TWO) TIMES DAILY WITH A MEAL. 07/31/20 07/31/21  Ladell Pier, MD  pioglitazone (ACTOS) 30 MG tablet TAKE 1 TABLET (30 MG TOTAL) BY MOUTH DAILY. 07/31/20 07/31/21  Ladell Pier, MD  risperiDONE (RISPERDAL) 1 MG tablet Take 1 tablet (1 mg total) by mouth 2 (two) times daily. 06/25/20   Nev[place] Crane, MD    Allergies    Patient has no known allergies.  Review of Systems   Review of Systems  All other systems reviewed and are negative.  Physical Exam Updated Vital Signs BP (!) 159/112 (BP Location: Left Arm)   Pulse 91   Temp 98.9 F (37.2 C) (Oral)   Resp 19   Ht 6' 3.5" (1.918 m)   Wt 126.6 kg   SpO2 95%   BMI 34.41 kg/m   Physical Exam Vitals and nursing note reviewed.  Constitutional:      Appearance: He is well-developed. He is not ill-appearing.  HENT:     Head: Normocephalic and atraumatic.     Right Ear: External ear normal.     Left Ear: External ear normal.  Eyes:     Conjunctiva/sclera: Conjunctivae normal.     Pupils: Pupils are equal, round, and reactive to light.  Neck:     Trachea: Phonation normal.  Cardiovascular:     Rate and Rhythm:  Normal rate.  Pulmonary:     Effort: Pulmonary effort is normal. No respiratory distress.     Breath sounds: No stridor.  Abdominal:     General: There is no distension.     Palpations: Abdomen is soft.     Tenderness: There is no abdominal tenderness.  Musculoskeletal:        General: Normal range of motion.     Cervical back: Normal range of motion and  neck supple.  Skin:    General: Skin is warm and dry.     Comments: He has calluses on multiple toes, which do not appear infected.  No other significant rash noted.  Neurological:     Mental Status: He is alert and oriented to person, place, and time.     Cranial Nerves: No cranial nerve deficit.     Sensory: No sensory deficit.     Motor: No abnormal muscle tone.     Coordination: Coordination normal.     Comments: Intact light touch sensation to all fingers and toes bilaterally.  Psychiatric:        Mood and Affect: Mood normal.        Behavior: Behavior normal.    ED Results / Procedures / Treatments   Labs (all labs ordered are listed, but only abnormal results are displayed) Labs Reviewed  ETHANOL  RAPID URINE DRUG SCREEN, HOSP PERFORMED    EKG None  Radiology No results found.  Procedures Procedures   Medications Ordered in ED Medications - No data to display  ED Course  I have reviewed the triage vital signs and the nursing notes.  Pertinent labs & imaging results that were available during my care of the patient were reviewed by me and considered in my medical decision making (see chart for details).    MDM Rules/Calculators/A&P                           Patient Vitals for the past 24 hrs:  BP Temp Temp src Pulse Resp SpO2 Height Weight  02/07/21 1135 (!) 144/112 98.3 F (36.8 C) Oral 71 10 98 % -- --  02/07/21 1027 (!) 157/101 -- -- 80 16 97 % -- --  02/07/21 0636 (!) 159/112 98.9 F (37.2 C) Oral 91 19 95 % 6' 3.5" (1.918 m) 126.6 kg    3:07 PM Reevaluation with update and discussion. After  initial assessment and treatment, an updated evaluation reveals no further complaints, he continues to be arousable but sleepy.  Findings discussed and questions answered. Daleen Bo   Medical Decision Making:  This patient is presenting for evaluation of achiness, and numbness, which does require a range of treatment options, and is a complaint that involves a moderate risk of morbidity and mortality. The differential diagnoses include metabolic disorder, occult infection, complications of substance abuse. I decided to review old records, and in summary middle-aged male, with history of diabetes, hypertension as well as psychiatric illness, presenting for sleepiness, and extremity tingling, after using cocaine. I did not require additional historical information from anyone.  Clinical Laboratory Tests Ordered, included  alcohol level . Review indicates normal. Radiologic Tests Ordered, included x-ray of feet, chest x-ray.  I independently Visualized: Radiographic images, which show no acute abnormalities  Critical Interventions-clinical evaluation, laboratory testing, radiography, reassessment  After These Interventions, the Patient was reevaluated and was found stable for discharge.  Patient with nonspecific complaints after binging on crack cocaine for 2 days.  Unclear if he is taking his medicines or if he has an.  No indication for further ED evaluation or treatment at this time.  CRITICAL CARE-no Performed by: Daleen Bo  Nursing Notes Reviewed/ Care Coordinated Applicable Imaging Reviewed Interpretation of Laboratory Data incorporated into ED treatment  The patient appears reasonably screened and/or stabilized for discharge and I doubt any other medical condition or other [facility] requiring further screening, evaluation, or treatment in the ED at  this time prior to discharge.  Plan: Home Medications-restart usual home medicines as soon as possible; Home Treatments-avoid cocaine;  return here if the recommended treatment, does not improve the symptoms; Recommended follow up-PCP, as needed.  Psychiatry as needed.     Final Clinical Impression(s) / ED Diagnoses Final diagnoses:  Cocaine abuse ([place])  Paresthesia  Noncompliance with medication regimen  Hyperglycemia    Rx / DC Orders ED Discharge Orders     None        Daleen Bo, MD 02/07/21 1732

## 2021-02-07 NOTE — Discharge Instructions (Addendum)
Do not use cocaine.  Make sure you are getting plenty of rest and drinking a lot of fluids.  See your psychiatrist for treatment including medications.  See your primary care doctor to be evaluated for high blood sugar and get prescriptions if needed.

## 2021-02-07 NOTE — ED Triage Notes (Signed)
Pt is falling asleep and slurring words during the triage process

## 2021-02-07 NOTE — ED Provider Notes (Addendum)
Patient had been discharged by Dr. Eulis Foster and when the nurse went in to discharge him he reported that he came because he was suicidal.  He is requesting to speak with mental health because he is suicidal.  He had an alcohol and blood sugar done earlier which was normal.  Plain films were negative.  Screening labs as well as COVID also pending. Pt decided he did not want to stay to see psych and now saying he is not suicidal.  No criteria to committ the pt and he was allowed to leave. He denies AH or VH and to prior provider did not endorse any SI/HI.   Blanchie Dessert, MD 02/07/21 1608    Blanchie Dessert, MD 02/07/21 [place], [place], MD 02/07/21 1701

## 2021-02-07 NOTE — ED Triage Notes (Signed)
Pt came in via EMS with c/o foot pain and SOB. Pt has admitted to smoking crack and drinking alcohol all night.

## 2021-02-07 NOTE — ED Provider Notes (Signed)
Emergency Medicine Provider Triage Evaluation Note  Duane Salazar , a 45 y.o. male  was evaluated in triage.  Pt complains of pain to the soles of bilateral feet and pleuritic chest pain.  Patient is a poor historian and unable to give further details on the nature of his injuries.  Patient endorses alcohol use and crack cocaine use.  Review of Systems  Positive: Pleuritic chest pain, myalgias, shortness of breath Negative: Hemoptysis, unilateral leg swelling or tenderness  Physical Exam  BP (!) 159/112 (BP Location: Left Arm)   Pulse 91   Temp 98.9 F (37.2 C) (Oral)   Resp 19   Ht 6' 3.5" (1.918 m)   Wt 126.6 kg   SpO2 95%   BMI 34.41 kg/m  Gen:   Easily arousable to voice, no distress   Resp:  Normal effort  MSK:   Moves extremities without difficulty, tenderness and swelling to bilateral heels Other:  Head atraumatic  Medical Decision Making  Medically screening exam initiated at 7:02 AM.  Appropriate orders placed.  Duane Salazar was informed that the remainder of the evaluation will be completed by another provider, this initial triage assessment does not replace that evaluation, and the importance of remaining in the ED until their evaluation is complete.  The patient appears stable so that the remainder of the work up may be completed by another provider.      Duane Beckwith, PA-C 02/07/21 0998    Duane Bo, MD 02/07/21 [phone]

## 2021-02-07 NOTE — ED Notes (Signed)
Patient is now awake however he said he is still not able to give a urine sample.

## 2021-02-07 NOTE — ED Notes (Signed)
Light green and lavender top tube sent to lab.

## 2021-03-05 ENCOUNTER — Other Ambulatory Visit: Payer: Self-pay | Admitting: Internal Medicine

## 2021-03-05 ENCOUNTER — Other Ambulatory Visit: Payer: Self-pay

## 2021-03-05 ENCOUNTER — Other Ambulatory Visit: Payer: Self-pay | Admitting: Physician Assistant

## 2021-03-05 DIAGNOSIS — I10 Essential (primary) hypertension: Secondary | ICD-10-CM

## 2021-03-05 MED ORDER — PREDNISONE 5 MG PO TABS: ORAL_TABLET | ORAL | 2 refills | Status: AC

## 2021-03-05 NOTE — Telephone Encounter (Signed)
Requested medication (s) are due for refill today: no  Requested medication (s) are on the active medication list:  yes  Last refill:  07/31/2020  Future visit scheduled: no  Notes to clinic: overdue for follow up Attempted to contact patient no answer: vm not set up    Requested Prescriptions  Pending Prescriptions Disp Refills   metFORMIN (GLUCOPHAGE) 1000 MG tablet 60 tablet 0    Sig: TAKE 1 TABLET (1,000 MG TOTAL) BY MOUTH 2 (TWO) TIMES DAILY WITH A MEAL.      Endocrinology:  Diabetes - Biguanides Failed - 03/05/2021 12:23 PM      Failed - HBA1C is between 0 and 7.9 and within 180 days    Hemoglobin A1C  Date Value Ref Range Status  01/31/2020 7.8 (A) 4.0 - 5.6 % Final   HbA1c, POC (controlled diabetic range)  Date Value Ref Range Status  11/23/2019 7.5 (A) 0.0 - 7.0 % Final          Failed - Valid encounter within last 6 months    Recent Outpatient Visits           1 year ago Uncontrolled type 2 diabetes mellitus with hyperglycemia [facility])   [place], [place], [place]   1 year ago Uncontrolled type 2 diabetes mellitus with hyperglycemia ([place])   [place] Fulp, [place], [place]   5 years ago Uncontrolled type 2 diabetes mellitus with hyperglycemia, with long-term current use of insulin ([place])   [place], [place], MD   6 years ago Type 2 diabetes mellitus without complication   [place], [place], [place]   6 years ago Essential hypertension   [place], [place], [place]                Passed - Cr in normal range and within 360 days    Creat  Date Value Ref Range Status  10/21/2014 0.88 0.50 - 1.35 mg/dL Final   Creatinine, Ser  Date Value Ref Range Status  07/20/2020 0.98 0.61 - 1.24 mg/dL Final   Creatinine, Urine  Date Value Ref Range Status  10/17/2015 103 20 - 370 mg/dL Final           Passed - AA eGFR in normal range and within 360 days    GFR, Est African American  Date Value Ref Range Status  10/21/2014 >89 mL/min Final   GFR calc Af Amer  Date Value Ref Range Status  05/20/2020 >60 >60 mL/min Final   GFR, Est Non African American  Date Value Ref Range Status  10/21/2014 >89 mL/min Final    Comment:      The estimated GFR is a calculation valid for adults (>=63 years old) that uses the CKD-EPI algorithm to adjust for age and sex. It is   not to be used for children, pregnant women, hospitalized patients,    patients on dialysis, or with rapidly changing kidney function. According to the NKDEP, eGFR >89 is normal, 60-89 shows mild impairment, 30-59 shows moderate impairment, 15-29 shows severe impairment and <15 is ESRD.      GFR, Estimated  Date Value Ref Range Status  07/20/2020 >60 >60 mL/min Final    Comment:    (NOTE) Calculated using the CKD-EPI Creatinine Equation (2021)             amLODipine (NORVASC) 5 MG tablet 30  tablet 0    Sig: TAKE 1 TABLET (5 MG TOTAL) BY MOUTH DAILY.      Cardiovascular:  Calcium Channel Blockers Failed - 03/05/2021 12:23 PM      Failed - Last BP in normal range    BP Readings from Last 1 Encounters:  02/07/21 (!) 136/100          Failed - Valid encounter within last 6 months    Recent Outpatient Visits           1 year ago Uncontrolled type 2 diabetes mellitus with hyperglycemia [facility])   [place], [place], [place]   1 year ago Uncontrolled type 2 diabetes mellitus with hyperglycemia ([place])   [place] Fulp, [place], MD   5 years ago Uncontrolled type 2 diabetes mellitus with hyperglycemia, with long-term current use of insulin ([place])   [place], [place], MD   6 years ago Type 2 diabetes mellitus without complication   [place] Boykin Nearing, MD   6 years  ago Essential hypertension   [place] Funches, [place], MD                  losartan-hydrochlorothiazide (HYZAAR) 100-25 MG tablet 30 tablet 0    Sig: TAKE 1 TABLET BY MOUTH DAILY. TO LOWER BLOOD PRESSURE      Cardiovascular: ARB + Diuretic Combos Failed - 03/05/2021 12:23 PM      Failed - K in normal range and within 180 days    Potassium  Date Value Ref Range Status  07/20/2020 3.8 3.5 - 5.1 mmol/L Final          Failed - Na in normal range and within 180 days    Sodium  Date Value Ref Range Status  07/20/2020 135 135 - 145 mmol/L Final  11/23/2019 136 134 - 144 mmol/L Final          Failed - Cr in normal range and within 180 days    Creat  Date Value Ref Range Status  10/21/2014 0.88 0.50 - 1.35 mg/dL Final   Creatinine, Ser  Date Value Ref Range Status  07/20/2020 0.98 0.61 - 1.24 mg/dL Final   Creatinine, Urine  Date Value Ref Range Status  10/17/2015 103 20 - 370 mg/dL Final          Failed - Ca in normal range and within 180 days    Calcium  Date Value Ref Range Status  07/20/2020 9.1 8.9 - 10.3 mg/dL Final   Calcium, Ion  Date Value Ref Range Status  12/23/2014 1.11 (L) 1.12 - 1.23 mmol/L Final          Failed - Last BP in normal range    BP Readings from Last 1 Encounters:  02/07/21 (!) 136/100          Failed - Valid encounter within last 6 months    Recent Outpatient Visits           1 year ago Uncontrolled type 2 diabetes mellitus with hyperglycemia [facility])   [place], [place], [place]   1 year ago Uncontrolled type 2 diabetes mellitus with hyperglycemia ([place])   [place] Fulp, [place], MD   5 years ago Uncontrolled type 2 diabetes mellitus with hyperglycemia, with long-term current use of insulin ([place])   [facility] Fun[place], [place], MD   6 years ago Type 2 diabetes mellitus without complication   [place], [place], MD   6 years ago Essential hypertension   [place], [place], MD                Passed - Patient is not pregnant        glipiZIDE (GLIPIZIDE XL) 10 MG 24 hr tablet 30 tablet 0    Sig: TAKE 1 TABLET (10 MG TOTAL) BY MOUTH DAILY WITH BREAKFAST.      Endocrinology:  Diabetes - Sulfonylureas Failed - 03/05/2021 12:23 PM      Failed - HBA1C is between 0 and 7.9 and within 180 days    Hemoglobin A1C  Date Value Ref Range Status  01/31/2020 7.8 (A) 4.0 - 5.6 % Final   HbA1c, POC (controlled diabetic range)  Date Value Ref Range Status  11/23/2019 7.5 (A) 0.0 - 7.0 % Final          Failed - Valid encounter within last 6 months    Recent Outpatient Visits           1 year ago Uncontrolled type 2 diabetes mellitus with hyperglycemia [facility])   [place], [place], [place]   1 year ago Uncontrolled type 2 diabetes mellitus with hyperglycemia ([place])   [place] Fulp, [place], MD   5 years ago Uncontrolled type 2 diabetes mellitus with hyperglycemia, with long-term current use of insulin ([place])   [place] T[place], [place], MD   6 years ago Type 2 diabetes mellitus without complication   [place] [facility] [place] Nearing, MD   6 years ago Essential hypertension   [place] [facility] Boykin Nearing, MD

## 2021-03-05 NOTE — Telephone Encounter (Signed)
  Notes to clinic:  this refill cannot be delegated    Requested Prescriptions  Pending Prescriptions Disp Refills   predniSONE (DELTASONE) 5 MG tablet 45 tablet 2    Sig: TAKE 1.5 TABLETS (7.5 MG TOTAL) BY MOUTH DAILY WITH BREAKFAST.      Not Delegated - Endocrinology:  Oral Corticosteroids Failed - 03/05/2021  1:09 PM      Failed - This refill cannot be delegated      Failed - Last BP in normal range    BP Readings from Last 1 Encounters:  02/07/21 (!) 136/100          Failed - Valid encounter within last 6 months    Recent Outpatient Visits           1 year ago Uncontrolled type 2 diabetes mellitus with hyperglycemia [facility])   [place], [place], [place]   1 year ago Uncontrolled type 2 diabetes mellitus with hyperglycemia ([place])   [place] Fulp, [place], MD   5 years ago Uncontrolled type 2 diabetes mellitus with hyperglycemia, with long-term current use of insulin ([place])   [place] [facility] [place], [place], MD   6 years ago Type 2 diabetes mellitus without complication   [place] [facility] Boy[place] Nearing, MD   6 years ago Essential hypertension   [place] [facility] Boykin Nearing, MD

## 2021-03-16 ENCOUNTER — Other Ambulatory Visit: Payer: Self-pay

## 2021-03-30 ENCOUNTER — Other Ambulatory Visit: Payer: Self-pay

## 2021-03-30 ENCOUNTER — Ambulatory Visit (HOSPITAL_COMMUNITY)
Admission: EM | Admit: 2021-03-30 | Discharge: 2021-03-30 | Disposition: A | Discharge status: Home / Self Care | Payer: No Payment, Other | Attending: Psychiatry | Admitting: Psychiatry

## 2021-03-30 DIAGNOSIS — F259 Schizoaffective disorder, unspecified: Secondary | ICD-10-CM

## 2021-03-30 DIAGNOSIS — F251 Schizoaffective disorder, depressive type: Secondary | ICD-10-CM

## 2021-03-30 MED ORDER — BENZTROPINE MESYLATE 1 MG PO TABS
1.0000 mg | ORAL_TABLET | Freq: Two times a day (BID) | ORAL | 0 refills | Status: DC
  Filled 2021-03-30 – 2021-04-07 (×2): qty 60, 30d supply, fill #0

## 2021-03-30 MED ORDER — BENZTROPINE MESYLATE 1 MG PO TABS
1.0000 mg | ORAL_TABLET | Freq: Two times a day (BID) | ORAL | Status: DC
  Filled 2021-03-30: qty 14

## 2021-03-30 MED ORDER — GABAPENTIN 100 MG PO CAPS
100.0000 mg | ORAL_CAPSULE | Freq: Three times a day (TID) | ORAL | 0 refills | Status: DC
  Filled 2021-03-30 – 2021-04-07 (×2): qty 90, 30d supply, fill #0

## 2021-03-30 MED ORDER — GABAPENTIN 100 MG PO CAPS
100.0000 mg | ORAL_CAPSULE | Freq: Three times a day (TID) | ORAL | Status: DC
  Filled 2021-03-30: qty 21

## 2021-03-30 MED ORDER — RISPERIDONE 1 MG PO TABS
1.0000 mg | ORAL_TABLET | Freq: Two times a day (BID) | ORAL | Status: DC
  Filled 2021-03-30: qty 14

## 2021-03-30 MED ORDER — RISPERIDONE 1 MG PO TABS
1.0000 mg | ORAL_TABLET | Freq: Two times a day (BID) | ORAL | 0 refills | Status: DC
  Filled 2021-03-30 – 2021-04-07 (×2): qty 60, 30d supply, fill #0

## 2021-03-30 NOTE — ED Notes (Signed)
Patient received sample medication and community resources to follow up. Denies SI, HI and AVH.

## 2021-03-30 NOTE — Progress Notes (Signed)
CSW contact ACTT services with the following results:  [name]: not taking new clients currently, informed it might be September before they begin taking new clients.  [facility]: not taking new clients currently, informed it may be next year before they can begin taking new clients as they have approximately 30 referrals that they have to see.  [facility]: informed they are backed up and it would be the middle of the month before they could see him.  [facility]: informed that their intake specialist would be back tomorrow to answer any questions.  Chalmers Guest. Guerry Bruin, MSW, LCSW, [place] 03/30/2021 1:17 PM

## 2021-03-30 NOTE — ED Provider Notes (Addendum)
Behavioral Health Urgent Care Medical Screening Exam  Patient Name: Duane Salazar MRN: [id number] Date of Evaluation: 03/30/21 Chief Complaint: Depression  Diagnosis:  Final diagnoses:  Schizoaffective disorder, unspecified type ([place])  Schizoaffective disorder, depressive type ([place])    History of Present illness: Duane Salazar is a 45 y.o. male w/ hx of multiple incarcerations, schizoaffective disorder, MDD here at [facility] due to concerns of depression and increasing suicidal/homicidal ideations. Pt states that he has been having progressive "feelings" of suicidal and homicidal ideation. Pt states that 4 days ago, he had a "blackout" episode where he was "crying while walking down a road" in [place] and then woke up in "somebody's backyard" in [place]. Pt states that he felt stressed by this event and decided to do cocaine. Pt has been at mom's home since "to rest" and decided to present to [facility] "to get whatever help he needs". Pt stated he had been incarcerated for a total of 27 years and does not wish to be incarcerated again due to his "criminal" behavior. Pt is agreeable to outpatient psychiatry and therapy. Pt requesting ACT team referral.  Pt has not been on any medications due to being unable to afford medication. Pt had originally filed for disability for acromegaly but has been denied a few times and has a request pending. Pt states he has had limited sleep (~3 hours) for past several months and has loss of appetite. Pt regularly smokes tobacco 1 pack per day, drinks a 6 pack a week of beer, and smoke marijuana 1-2 times per week.  Pt endorses passive SI/HI but has no plan to harm self or anyone. Pt denies present [place]. Pt states last AH was 2 weeks ago telling him to kill himself.  Of note, per Education officer, museum, pt failed to attend a court hearing this morning. Pt denied having any pending court hearings to this Probation officer.   Psychiatric Specialty Exam  Presentation  General  Appearance:Appropriate for Environment  Eye Contact:Fair  Speech:Clear and Coherent; Slurred  Speech Volume:Normal  Handedness:Right   Mood and Affect  Mood:Anxious  Affect:Depressed   Thought Process  Thought Processes:Coherent; Goal Directed  Descriptions of Associations:Intact  Orientation:Full (Time, Place and Person)  Thought Content:Logical    Hallucinations:None  Ideas of Reference:None  Suicidal Thoughts:Yes, Passive Without Intent; Without Plan  Homicidal Thoughts:Yes, Passive Without Intent; Without Plan   Sensorium  Memory:Immediate Good; Recent Good; Remote Fair  Judgment:Fair  Insight:Fair   Executive Functions  Concentration:Fair  Attention Span:Fair  [place]   Psychomotor Activity  Psychomotor Activity:Normal   Assets  Assets:Desire for Improvement; Housing; Social Support   Sleep  Sleep:Poor  Number of hours: 3   No data recorded  Physical Exam: Physical Exam Vitals and nursing note reviewed.  Constitutional:      Appearance: He is well-developed.  HENT:     Head: Normocephalic and atraumatic.  Eyes:     Conjunctiva/sclera: Conjunctivae normal.  Cardiovascular:     Rate and Rhythm: Normal rate and regular rhythm.     Heart sounds: No murmur heard. Pulmonary:     Effort: Pulmonary effort is normal. No respiratory distress.     Breath sounds: Normal breath sounds.  Abdominal:     Palpations: Abdomen is soft.     Tenderness: There is no abdominal tenderness.  Musculoskeletal:     Cervical back: Neck supple.  Skin:    General: Skin is warm and dry.  Neurological:     Mental Status:  He is alert.   Review of Systems  Constitutional:  Negative for chills and fever.  Respiratory:  Negative for cough, shortness of breath and wheezing.   Cardiovascular:  Negative for chest pain and palpitations.  Gastrointestinal:  Negative for abdominal pain, nausea and vomiting.   Skin:  Negative for itching and rash.  Neurological:  Negative for dizziness and headaches.  Blood pressure (!) 151/111, pulse 80, temperature 98.3 F (36.8 C), temperature source Oral, resp. rate 18, SpO2 100 %. There is no height or weight on file to calculate BMI.  Musculoskeletal: Strength & Muscle Tone: within normal limits Gait & Station: normal Patient leans: [place] MSE Discharge Disposition for Follow up and Recommendations: Based on my evaluation the patient does not appear to have an emergency medical condition and can be discharged with resources and follow up care in outpatient services for Medication Management and Individual Therapy     [place], MD 03/30/2021, 12:16 PM

## 2021-03-30 NOTE — BH Assessment (Signed)
Patient presents this date with passive S/I. Patient denies any plan or intent. Patient voices H/I towards multiple people that "made him mad" although will not elaborate on content of statement. Patient voices active AVH although is vague in reference to content stating he "just hears and sees things." Patient states he was receiving services from [facility] after his last admission on 02/07/21 when he presented to [facility] with similar symptoms. Patient states he has not been on medications since his discharge reporting he did not have transportation. Patient has a extensive history of cocaine use. Patient denies any current SA use.        Wentz on 02/07/21 wrote on discharge on 02/07/21: Patient had been discharged by Dr. Eulis Foster and when the nurse went in to discharge him he reported that he came because he was suicidal.  He is requesting to speak with mental health because he is suicidal.  He had an alcohol and blood sugar done earlier which was normal.  Plain films were negative.  Screening labs as well as COVID also pending. Pt decided he did not want to stay to see psych and now saying he is not suicidal.  No criteria to commit the pt and he was allowed to leave. He denies AH or VH and to prior provider did not endorse any SI/HI.

## 2021-03-30 NOTE — Discharge Instructions (Addendum)
   Please come to [facility] (this facility) during walk in hours for appointment with psychiatrist for further medication management and for therapists for therapy.    Walk in hours are 8-11 AM Monday through Thursday for medication management. Therapy walk in hours are Monday-Wednesday 8 AM-1PM.   It is first come, first -serve; it is best to arrive by 7:00 AM.   On Friday from 1 pm to 4 pm for therapy intake only. Please arrive by 12:00 pm as it is  first come, first -serve.    When you arrive please go upstairs for your appointment. If you are unsure of where to go, inform the front desk that you are here for a walk in appointment and they will assist you with directions upstairs.  Address:  [address], in [place], [place] Ph: [phone]   Walk in hours are not available this week but will resume Monday august 8.   You can also contact these providers regarding ACTT services:  [facility] [phone]  [facility] [phone]

## 2021-03-30 NOTE — Progress Notes (Signed)
Contact information for [facility] and [facility] added to resources on AVS.  Hedy Camara R. Guerry Bruin, MSW, [place], [place] 03/30/2021 1:22 PM

## 2021-04-01 ENCOUNTER — Other Ambulatory Visit: Payer: Self-pay

## 2021-04-02 ENCOUNTER — Ambulatory Visit: Payer: Self-pay | Admitting: Family

## 2021-04-06 ENCOUNTER — Other Ambulatory Visit: Payer: Self-pay

## 2021-04-07 ENCOUNTER — Other Ambulatory Visit: Payer: Self-pay

## 2021-04-26 ENCOUNTER — Emergency Department (HOSPITAL_COMMUNITY): Admission: EM | Admit: 2021-04-26 | Discharge: 2021-04-26 | Discharge status: Against Medical Advice | Payer: Self-pay

## 2021-04-26 NOTE — ED Notes (Signed)
NA X3 for triage.

## 2021-04-26 NOTE — ED Notes (Signed)
Patient left on own accord °

## 2021-06-15 ENCOUNTER — Other Ambulatory Visit: Payer: Self-pay | Admitting: Psychiatry

## 2021-06-15 ENCOUNTER — Other Ambulatory Visit: Payer: Self-pay

## 2021-06-15 ENCOUNTER — Ambulatory Visit: Payer: Self-pay | Admitting: Internal Medicine

## 2021-06-15 DIAGNOSIS — F251 Schizoaffective disorder, depressive type: Secondary | ICD-10-CM

## 2021-06-16 ENCOUNTER — Other Ambulatory Visit: Payer: Self-pay

## 2021-06-16 ENCOUNTER — Telehealth (HOSPITAL_COMMUNITY): Payer: Self-pay | Admitting: *Deleted

## 2021-06-16 NOTE — Telephone Encounter (Signed)
Mom called on his behalf asking if he can get his medicine. Reviewed his record and he has not been seen here in a year, seen downstairs in aug 2022. Told mom he would have to come in as a walkin to reestablish care, we could not give meds till he is seen by a provider. Mom says he is "not living any where he is [place]".

## 2021-06-22 ENCOUNTER — Other Ambulatory Visit: Payer: Self-pay

## 2021-06-26 ENCOUNTER — Other Ambulatory Visit: Payer: Self-pay

## 2021-06-30 ENCOUNTER — Ambulatory Visit: Payer: Self-pay

## 2021-06-30 NOTE — Telephone Encounter (Signed)
Patient called in and complained about pain tingling and numbness in his left side arm, fingers, leg and toes say that he have a brain tumor and was incarcerated for a while but was told by his Surgeon that he need to keep regular check on self also state that he is diabetic. Please call patient he is concerned about the different feelings in his body Ph#  [phone]   Patient returned call and and c/o worsening right side weakness, N/T in right first 2 fingers , legs and toes. Patient reports he has had weakness on right side couple of months due to a brain tumor and would like tumor evaluated to see if it is getting bigger. C/o headache coming and going. C/o losing mobility in right side and he is unable to work with holding tools etc. Using left arm, hand to complete tasks. Denies chest pain, difficulty breathing, facial N/T or visual or speech problems. C/o heart beating fast at times . Denies dizziness or lightheadedness. Can walk but reports feeling weaker . Instructed patient to go to ED if weakness if getting worse.patient would like to f/u with PCP. Patient reports he has not been compliant with taking medications for management of hypertension and diabetes. Encouraged patient to check blood sugars and B/P as ordered. Patient reports he is having transportation issues and is requesting assistance and resources to get set up and requirements met for orange care and / or medicaid. Patient is aware he was almost complete with information for orange card and became frustrated with process. Patient reports he will complete the process to get assistance . Care advise given. Appt already scheduled for patient 08/06/21. Patent verbalized understanding of care advise and to call back or go to ED if symptoms worsen.

## 2021-06-30 NOTE — Telephone Encounter (Signed)
Patient called in and complained about pain tingling and numbness in his left side arm, fingers, leg and toes say that he have a brain tumor and was incarcerated for a while but was told by his Surgeon that he need to keep regular check on self also state that he is diabetic. Please call patient he is concerned about the different feelings in his body Ph#  [phone]  Reason for Disposition  [1] Weakness of arm / hand, or leg / foot AND [2] is a chronic symptom (recurrent or ongoing AND present > 4 weeks)  Answer Assessment - Initial Assessment Questions 1. SYMPTOM: "What is the main symptom you are concerned about?" (e.g., weakness, numbness)     Worsening N/T on right side , right side weaker hx brain tumor  2. ONSET: "When did this start?" (minutes, hours, days; while sleeping)     Couple of months ago  3. LAST NORMAL: "When was the last time you (the patient) were normal (no symptoms)?"     Couple of months ago  4. PATTERN "Does this come and go, or has it been constant since it started?"  "Is it present now?"     Headaches come and go , right side weakness constant  5. CARDIAC SYMPTOMS: "Have you had any of the following symptoms: chest pain, difficulty breathing, palpitations?"     Heart beats fast at times  6. NEUROLOGIC SYMPTOMS: "Have you had any of the following symptoms: headache, dizziness, vision loss, double vision, changes in speech, unsteady on your feet?"     Headaches come and go , right side weaker than usual  7. OTHER SYMPTOMS: "Do you have any other symptoms?"     Right side N/T weaker than normal, losing mobility on right side  8. PREGNANCY: "Is there any chance you are pregnant?" "When was your last menstrual period?"     na  Protocols used: Neurologic Deficit-A-AH

## 2021-07-09 ENCOUNTER — Encounter (HOSPITAL_COMMUNITY): Payer: Self-pay | Admitting: Psychiatry

## 2021-07-09 ENCOUNTER — Telehealth (INDEPENDENT_AMBULATORY_CARE_PROVIDER_SITE_OTHER): Payer: No Payment, Other | Admitting: Psychiatry

## 2021-07-09 ENCOUNTER — Other Ambulatory Visit: Payer: Self-pay

## 2021-07-09 DIAGNOSIS — F251 Schizoaffective disorder, depressive type: Secondary | ICD-10-CM

## 2021-07-09 DIAGNOSIS — F411 Generalized anxiety disorder: Secondary | ICD-10-CM

## 2021-07-09 MED ORDER — GABAPENTIN 300 MG PO CAPS
300.0000 mg | ORAL_CAPSULE | Freq: Three times a day (TID) | ORAL | 3 refills | Status: DC
  Filled 2021-07-09: qty 90, 30d supply, fill #0
  Filled 2021-08-03: qty 90, 30d supply, fill #1
  Filled 2021-09-01: qty 90, 30d supply, fill #2

## 2021-07-09 MED ORDER — QUETIAPINE FUMARATE 100 MG PO TABS
100.0000 mg | ORAL_TABLET | Freq: Every day | ORAL | 3 refills | Status: DC
Start: 2021-07-09 — End: 2021-10-01
  Filled 2021-07-09: qty 30, 30d supply, fill #0
  Filled 2021-08-03: qty 30, 30d supply, fill #1
  Filled 2021-09-01: qty 30, 30d supply, fill #2

## 2021-07-09 NOTE — Progress Notes (Signed)
Duane Salazar OP Progress Note Virtual Visit via Telephone Note  I connected with Duane Salazar on 07/09/21 at  9:00 AM EST by telephone and verified that I am speaking with the correct person using two identifiers.  Location: Patient: home Provider: Clinic   I discussed the limitations, risks, security and privacy concerns of performing an evaluation and management service by telephone and the availability of in person appointments. I also discussed with the patient that there may be a patient responsible charge related to this service. The patient expressed understanding and agreed to proceed.   I provided 30 minutes of non-face-to-face time during this encounter.  07/09/2021 9:55 AM Duane Salazar  MRN:  [id number]  Chief Complaint: "I need something for my anxiety"  HPI: 45 year old male seen today for follow up psychiatric evaluation. He is a former patient of Dr. Toy Care who is being transferred to Probation officer for medication management. He was seen at [facility] on 03/30/2021 where her presented with SI/HI and reported blackouts and crying spells. He has a psychiatric history of schizoaffective disorder, MDD, and poly substance use (marijuana, alcohol, and cocaine, and tobacco notes that he has been sober for a month). Currently he is managed on Risperdal 1 mg twice daily however notes that he ran out of it a few weeks ago. He notes that he has tried Seroquel (found effective), Haldol (found effective but caused EPS), Trazodone (disliked) cogentin, and gabapentin in the past.   Today he was unable to login virtually so his assessment was done over the phone. During exam he was pleasant, cooperative, and engaged in conversation. He notes that he has been more anxious. He informed Probation officer that he has been sober from illegal substances for a month and been more anxious because he is now facing reality. He notes that things have progressively gotten worse since he ran out of his medications two weeks ago. He notes  that he is irritable, distractible, has racing thoughts, impulsive behaviors, and poor sleep. Patient notes that he has not hallucinated since he was released from jail. Today he denies SI/HI/VAH or paranoia.   Patient notes that most days he is anxious and on edge. He notes that his stressors include getting approved for disability and supporting his family. Today provider conducted a GAD 7 and patient scored a 17. Provider also conducted a PHQ 9 and patient scored a 19. He endorses poor sleep (1-2 hours) and poor appetite.   Patient also informed writer that he has pain in is joints most days. He reports that in the past he had a brain tumor which worsened his physical and mental health. He noted that gabapentin was effective in managed his pain in the past.   Patient asked writer if he could restart Haldol as he found it most effective. Provider reminded patient that it was discontinued due to EPS. He notes that Risperdal is just not as effective. Provider asked patient if he was interested in a LAI such as Abilify or invega and he notes that he was note. He notes that Seroquel was effective in the past. Today he is agreeable to starting Seroquel 100 mg to help manage anxiety, depression, sleep, and mood. He is also agreeable to starting Gabapentin 300 mg three times daily to help manage anxiety and pain. Patient referred to outpatient counseling for therapy. No other concerns noted at this time.   Visit Diagnosis:    ICD-10-CM   1. Schizoaffective disorder, depressive type ([place])  F25.1 QUEtiapine (SEROQUEL)  100 MG tablet    2. 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   Schizo affective schizophrenia ([place]) 1995   Schizophrenia ([place])    Seizures ([place])    Sleep apnea 1995   on CPAP    Past Surgical History:  Procedure Laterality Date   PITUITARY SURGERY  2005 & 2012   SKIN BIOPSY      Family Psychiatric History: Maternal uncle and Grandfather Substance use, mother substance use, depression, and anxiety  Family History:  Family History  Problem Relation Age of Onset   Hypertension Mother    Diabetes Mother    Heart Problems Mother    Cancer Maternal Uncle    Alcoholism Maternal Uncle    Cancer Maternal Grandmother    Heart disease Neg Hx     Social History:  Social History   Socioeconomic History   Marital status: Single    Spouse name: Not on file   Number of children: 2   Years of education: GED   Highest education level: Not on file  Occupational History   Not on file  Tobacco Use   Smoking status: Every Day    Packs/day: 1.00    Years: 20.00    Pack years: 20.00    Types: Cigarettes   Smokeless tobacco: Never   Tobacco comments:    Smoking .5 ppd  Substance and Sexual Activity   Alcohol use: Yes    Alcohol/week: 2.0 standard drinks    Types: 2 Cans of beer per week    Comment: 2 40s a day   Drug use: Yes    Types: Cocaine, Marijuana, Heroin    Comment: Reports used 3 weeks ago. 10/17/2015   Sexual activity: Yes  Other Topics Concern   Not on file  Social History Narrative   Lives with mom.   Incarcerated for 22 months in [place], [place]. From 2014-09/2014   Social Determinants of Health   Financial Resource Strain: Not on file  Food Insecurity: Not on file  Transportation Needs: Not on file  Physical Activity: Not on file  Stress: Not on file  Social Connections: Not on file    Allergies: No Known Allergies  Metabolic Disorder Labs: Lab  Results  Component Value Date   HGBA1C 7.8 (A) 01/31/2020   No results found for: PROLACTIN Lab Results  Component Value Date   CHOL 115 02/11/2020   TRIG 85 02/11/2020   HDL 47 02/11/2020   CHOLHDL 2.4 02/11/2020   VLDL 17 02/11/2020   LDLCALC 51 02/11/2020   LDLCALC 96 10/21/2014   Lab Results  Component Value Date   TSH 0.575 11/23/2019   TSH 0.449 09/15/2015    Therapeutic Level Labs: No results found for: LITHIUM No results found for: VALPROATE No components found for:  CBMZ  Current Medications: Current Outpatient Medications  Medication Sig Dispense Refill   QUEtiapine (SEROQUEL) 100 MG tablet Take 1 tablet (100 mg total) by mouth at bedtime. 60 tablet  3   amLODipine (NORVASC) 5 MG tablet TAKE 1 TABLET (5 MG TOTAL) BY MOUTH DAILY. (Patient not taking: Reported on 02/07/2021) 30 tablet 0   benztropine (COGENTIN) 1 MG tablet Take 1 tablet (1 mg total) by mouth 2 (two) times daily. 60 tablet 0   Blood Glucose Monitoring Suppl (TRUE METRIX METER) w/Device KIT USE AS DIRECTED IN THE MORNING AND 1 HOUR AFTER MEAL. (Patient not taking: Reported on 02/07/2021) 1 kit 0   diclofenac Sodium (VOLTAREN) 1 % GEL Apply 2 g topically 4 (four) times daily. (Patient not taking: Reported on 02/07/2021) 50 g 1   gabapentin (NEURONTIN) 300 MG capsule Take 1 capsule (300 mg total) by mouth 3 (three) times daily. 90 capsule 3   glipiZIDE (GLUCOTROL XL) 10 MG 24 hr tablet TAKE 1 TABLET (10 MG TOTAL) BY MOUTH DAILY WITH BREAKFAST. (Patient not taking: Reported on 02/07/2021) 30 tablet 0   glucose blood test strip CHECK YOUR SUGAR IN THE MORNING BEFORE YOU EAT BREAKFAST, AND ONE HOUR AFTER A MEAL. (Patient not taking: Reported on 02/07/2021) 100 strip 2   glucose monitoring kit (FREESTYLE) monitoring kit 1 each by Does not apply route daily. Check glucose once in the morning before breakfast and 1 hour after a meal (Patient not taking: Reported on 02/07/2021) 1 each 0   ibuprofen (ADVIL) 600 MG tablet  Take 1 tablet (600 mg total) by mouth 3 (three) times daily. (Patient not taking: Reported on 02/07/2021) 60 tablet 1   losartan-hydrochlorothiazide (HYZAAR) 100-25 MG tablet TAKE 1 TABLET BY MOUTH DAILY. TO LOWER BLOOD PRESSURE (Patient not taking: Reported on 02/07/2021) 30 tablet 0   metFORMIN (GLUCOPHAGE) 1000 MG tablet TAKE 1 TABLET (1,000 MG TOTAL) BY MOUTH 2 (TWO) TIMES DAILY WITH A MEAL. (Patient not taking: Reported on 02/07/2021) 60 tablet 0   pioglitazone (ACTOS) 30 MG tablet TAKE 1 TABLET (30 MG TOTAL) BY MOUTH DAILY. (Patient not taking: Reported on 02/07/2021) 30 tablet 0   No current facility-administered medications for this visit.     Musculoskeletal: Strength & Muscle Tone:  Unable to assess due to telehealth visit [place]:  Unable to assess due to telehealth visit Patient leans: N/A  Psychiatric Specialty Exam: Review of Systems  There were no vitals taken for this visit.There is no height or weight on file to calculate BMI.  General Appearance:  Unable to assess due to telehealth visit  Eye Contact:   Unable to assess due to telehealth visit  Speech:  Clear and Coherent and Normal Rate  Volume:  Normal  Mood:  Anxious and Depressed  Affect:  Appropriate and Congruent  Thought Process:  Coherent, Goal Directed, and Linear  Orientation:  Full (Time, Place, and Person)  Thought Content: WDL and Logical   Suicidal Thoughts:  No  Homicidal Thoughts:  No  Memory:  Immediate;   Good Recent;   Good Remote;   Good  Judgement:  Good  Insight:  Good  Psychomotor Activity:   Unable to assess due to telephone visit  Concentration:  Concentration: Fair and Attention Span: Fair  Recall:  Good  Fund of Knowledge: Good  Language: Good  Akathisia:  No  Handed:  Left  AIMS (if indicated): not done  Assets:  Communication Skills Desire for Improvement Housing Intimacy Leisure Time Physical Health Social Support  ADL's:  Intact  Cognition: WNL  Sleep:  Poor    Screenings: AIMS    Flowsheet Row Admission (Discharged) from OP Visit from 02/10/2020 in  [place] [facility] Admission (Discharged) from 09/18/2015 in [place] [facility] Admission (Discharged) from 07/06/2015 in [place] Total Score 0 0 0      AUDIT    Flowsheet Row Admission (Discharged) from OP Visit from 02/10/2020 in [place] [facility] Admission (Discharged) from 09/18/2015 in [place] [facility] Admission (Discharged) from 07/06/2015 in [place] Admission (Discharged) from 06/27/2015 in [place]  Alcohol Use Disorder Identification Test Final Score (AUDIT) 3 8 39 31      GAD-7    Flowsheet Row Video Visit from 07/09/2021 in [facility] Office Visit from 11/23/2019 in [place] Office Visit from 10/17/2015 in [place]  Total GAD-7 Score '17 17 21      ' PHQ2-9    Flowsheet Row Video Visit from 07/09/2021 in [facility] Office Visit from 11/23/2019 in [place] Office Visit from 10/22/2015 in [place] Office Visit from 10/17/2015 in [place] Office Visit from 01/02/2015 in [place]  PHQ-2 Total Score 5 2 0 4 0  PHQ-9 Total Score 19 8 -- 23 --      Flowsheet Row ED from 02/07/2021 in [place] DEPT ED from 11/27/2020 in [facility] ED from 05/21/2020 in [place] No Risk No Risk No Risk        Assessment and Plan: Patient notes that he has been out of his Risperdal for two weeks. He notes that he found Seroquel and Haldol more effective. Haldol was  discontinued due to EPS in the past. Today he is agreeable to starting Seroquel 100 mg to help manage anxiety, depression, sleep, and mood. He is also agreeable to starting Gabapentin 300 mg three times daily to help manage anxiety and pain. Patient referred to outpatient counseling for therapy.  1. Schizoaffective disorder, depressive type ([place])  Start- QUEtiapine (SEROQUEL) 100 MG tablet; Take 1 tablet (100 mg total) by mouth at bedtime.  Dispense: 60 tablet; Refill: 3 - Ambulatory referral to Social Work  2. Generalized anxiety disorder  Start- QUEtiapine (SEROQUEL) 100 MG tablet; Take 1 tablet (100 mg total) by mouth at bedtime.  Dispense: 60 tablet; Refill: 3 Start- gabapentin (NEURONTIN) 300 MG capsule; Take 1 capsule (300 mg total) by mouth 3 (three) times daily.  Dispense: 90 capsule; Refill: 3 - Ambulatory referral to Social Work   Follow up in 3 months Follow up with therapy Salley Slaughter, NP 07/09/2021, 9:55 AM

## 2021-08-03 ENCOUNTER — Other Ambulatory Visit: Payer: Self-pay

## 2021-08-06 ENCOUNTER — Ambulatory Visit: Payer: Self-pay | Admitting: Family Medicine

## 2021-08-06 ENCOUNTER — Encounter: Payer: Self-pay | Admitting: Family Medicine

## 2021-08-20 ENCOUNTER — Telehealth (HOSPITAL_COMMUNITY): Payer: Self-pay | Admitting: Licensed Clinical Social Worker

## 2021-08-20 ENCOUNTER — Encounter (HOSPITAL_COMMUNITY): Payer: Self-pay

## 2021-08-20 ENCOUNTER — Ambulatory Visit (HOSPITAL_COMMUNITY): Payer: No Payment, Other | Admitting: Licensed Clinical Social Worker

## 2021-08-20 NOTE — Telephone Encounter (Signed)
LCSW sent link to patient phone at 09:59 for 10:00 appt. At 10:05 LCSW f/u with a PC with no response and left HIPAA compliant VM. LCSW sent another link at 10:06 and waited until 10:16 before disconnecting

## 2021-09-01 ENCOUNTER — Other Ambulatory Visit: Payer: Self-pay

## 2021-09-04 ENCOUNTER — Other Ambulatory Visit (HOSPITAL_COMMUNITY): Payer: Self-pay

## 2021-09-04 ENCOUNTER — Encounter: Payer: Self-pay | Admitting: Internal Medicine

## 2021-09-04 ENCOUNTER — Ambulatory Visit: Payer: Self-pay | Attending: Internal Medicine | Admitting: Internal Medicine

## 2021-09-04 ENCOUNTER — Other Ambulatory Visit: Payer: Self-pay

## 2021-09-04 VITALS — BP 132/91 | HR 107 | Resp 16 | Wt 257.2 lb

## 2021-09-04 DIAGNOSIS — G5621 Lesion of ulnar nerve, right upper limb: Secondary | ICD-10-CM

## 2021-09-04 DIAGNOSIS — D509 Iron deficiency anemia, unspecified: Secondary | ICD-10-CM

## 2021-09-04 DIAGNOSIS — Z8639 Personal history of other endocrine, nutritional and metabolic disease: Secondary | ICD-10-CM

## 2021-09-04 DIAGNOSIS — E669 Obesity, unspecified: Secondary | ICD-10-CM

## 2021-09-04 DIAGNOSIS — E893 Postprocedural hypopituitarism: Secondary | ICD-10-CM

## 2021-09-04 DIAGNOSIS — Z6831 Body mass index (BMI) 31.0-31.9, adult: Secondary | ICD-10-CM

## 2021-09-04 DIAGNOSIS — G8929 Other chronic pain: Secondary | ICD-10-CM

## 2021-09-04 DIAGNOSIS — E1169 Type 2 diabetes mellitus with other specified complication: Secondary | ICD-10-CM

## 2021-09-04 DIAGNOSIS — E2749 Other adrenocortical insufficiency: Secondary | ICD-10-CM

## 2021-09-04 DIAGNOSIS — M25551 Pain in right hip: Secondary | ICD-10-CM

## 2021-09-04 DIAGNOSIS — I10 Essential (primary) hypertension: Secondary | ICD-10-CM

## 2021-09-04 LAB — POCT GLYCOSYLATED HEMOGLOBIN (HGB A1C): HbA1c, POC (controlled diabetic range): 6.8 % (ref 0.0–7.0)

## 2021-09-04 LAB — GLUCOSE, POCT (MANUAL RESULT ENTRY): POC Glucose: 160 mg/dl — AB (ref 70–99)

## 2021-09-04 MED ORDER — PREDNISONE 5 MG PO TABS
5.0000 mg | ORAL_TABLET | Freq: Every day | ORAL | 0 refills | Status: DC
  Filled 2021-09-04: qty 30, 30d supply, fill #0

## 2021-09-04 MED ORDER — CELECOXIB 200 MG PO CAPS
200.0000 mg | ORAL_CAPSULE | Freq: Every day | ORAL | 1 refills | Status: DC
  Filled 2021-09-04 – 2021-10-28 (×2): qty 30, 30d supply, fill #0

## 2021-09-04 MED ORDER — METFORMIN HCL 500 MG PO TABS
500.0000 mg | ORAL_TABLET | Freq: Every day | ORAL | 3 refills | Status: DC
  Filled 2021-09-04 – 2021-10-28 (×2): qty 30, 30d supply, fill #0

## 2021-09-04 MED ORDER — LOSARTAN POTASSIUM 25 MG PO TABS
25.0000 mg | ORAL_TABLET | Freq: Every day | ORAL | 3 refills | Status: DC
  Filled 2021-09-04 – 2021-10-28 (×2): qty 30, 30d supply, fill #0

## 2021-09-04 NOTE — Progress Notes (Addendum)
Patient ID: Duane Salazar, male    DOB: [birth year]  MRN: [id number]  CC:  chronic ds management  Subjective: Duane Salazar is a 46 y.o. male who presents for chronic ds management.  Previous PCP was Dr. Chapman Salazar who is no longer with the practice. 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  He states he was incarcerated and released about 5 months ago.  He has been out of all of his medicines for about 6 months. Pt release from prison 5 mths ago Hx of HTN - was on losartan/HCTZ, amlodipine DM- oof Metformin x 5-6 mths.  Meter gave out 2 wks ago. Was checking BS 2 x a day with range 130-160. History of pituitary macroadenoma status postresection with subsequent adrenal insufficiency: Has not seen an endocrinologist in 3-4 yrs.  States he was suppose to be on Prednisone 7.5 mg daily but has been out of that for a while.  He endorses weakness and dizziness at times.  No headaches.  Looks like last MRI of the head was in 2019 through [facility].  C/o pain in LT hip x 8 mths. Worse when he tries to stand up from sitting and hurts when he puts pressure on it.  Rates pain 10/10.   Takes BC Arthritis which does not help.  Also c/o numbness in RT hand mainly in 4-5th digits x 1 yr Not able to grip objects well.  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  Has not worked in 1 yr because of decreased grip in the right hand..    Needs a demographic letter for SSI. Applied for SS 3 mths ago.    Patient Active Problem List   Diagnosis Date Noted   Schizoaffective disorder ([place]) 02/11/2020   Schizophrenia ([place]) 02/10/2020   Hep C w/o coma, chronic ([place]) 10/21/2015   Injury of right Achilles tendon 10/17/2015   Tinea pedis 10/17/2015    Onychomycosis of toenail 10/17/2015   Secondary adrenal insufficiency ([place]) 10/17/2015   Alcohol use disorder, mild, abuse 09/18/2015   Cannabis use disorder, mild, abuse 09/18/2015   Opioid use disorder, moderate, dependence ([place]) 09/18/2015   Tobacco use disorder 09/18/2015   Cocaine use disorder, severe, dependence ([place]) 09/16/2015   Schizoaffective disorder, depressive type ([place])    Suicidal ideation 05/07/2015   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[facility])    Chronic pain syndrome 03/11/2015   Pituitary macroadenoma ([place]) 03/11/2015   Chronic headache 01/02/2015   Status post transsphenoidal pituitary resection ([place]) 01/02/2015   HTN (hypertension) 10/21/2014   Morbid obesity with BMI of 40.0-44.9, adult ([place]) 10/21/2014   Sleep apnea    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[place])    Arthritis    Diabetes type 2, uncontrolled    Adrenal insufficiency ([place]) 12/23/2005     Current Outpatient Medications on File Prior to Visit  Medication Sig Dispense Refill   benztropine (COGENTIN) 1 MG tablet Take 1 tablet (1 mg total) by mouth 2 (two) times daily. 60 tablet 0   gabapentin (NEURONTIN) 300 MG capsule Take 1 capsule (300 mg total) by mouth 3 (three) times daily. 90 capsule 3   glucose monitoring kit (FREESTYLE) monitoring kit 1 each by Does not apply route daily. Check glucose once in  the morning before breakfast and 1 hour after a meal (Patient not taking: Reported on 02/07/2021) 1 each 0   QUEtiapine (SEROQUEL) 100 MG tablet Take 1 tablet (100 mg total) by mouth at bedtime. 60 tablet 3   No current facility-administered medications on file prior to visit.    No Known Allergies  Social History   Socioeconomic History   Marital status: Single    Spouse name: Not on file   Number of children: 2   Years of education: GED   Highest education level: Not on file  Occupational History   Not on file  Tobacco Use   Smoking status: Every Day    Packs/day: 1.00    Years: 20.00    Pack years: 20.00     Types: Cigarettes   Smokeless tobacco: Never   Tobacco comments:    Smoking .5 ppd  Substance and Sexual Activity   Alcohol use: Yes    Alcohol/week: 2.0 standard drinks    Types: 2 Cans of beer per week    Comment: 2 40s a day   Drug use: Yes    Types: Cocaine, Marijuana, Heroin    Comment: Reports used 3 weeks ago. 10/17/2015   Sexual activity: Yes  Other Topics Concern   Not on file  Social History Narrative   Lives with mom.   Incarcerated for 22 months in [place], [place]. From 2014-09/2014   Social Determinants of Health   Financial Resource Strain: Not on file  Food Insecurity: Not on file  Transportation Needs: Not on file  Physical Activity: Not on file  Stress: Not on file  Social Connections: Not on file  Intimate Partner Violence: Not on file    Family History  Problem Relation Age of Onset   Hypertension Mother    Diabetes Mother    Heart Problems Mother    Cancer Maternal Uncle    Alcoholism Maternal Uncle    Cancer Maternal Grandmother    Heart disease Neg Hx     Past Surgical History:  Procedure Laterality Date   PITUITARY SURGERY  2005 & 2012   SKIN BIOPSY      ROS: Review of Systems Negative except as stated above  PHYSICAL EXAM: BP (!) 132/91    Pulse (!) 107    Resp 16    Wt 257 lb 3.2 oz (116.7 kg)    SpO2 100%    BMI 31.72 kg/m   Physical Exam   General appearance - alert, well appearing,  middle-age African-American male  in no distress Mental status - normal mood, behavior, speech, dress, motor activity, and thought processes Mouth - mucous membranes moist, pharynx normal without lesions Neck - supple, no significant adenopathy Chest - clear to auscultation, no wheezes, rales or rhonchi, symmetric air entry Heart -slightly tachycardic, regular rhythm, normal S1, S2, no murmurs, rubs, clicks or gallops Musculoskeletal -grip 4/5 on the right, 5/5 on the left.  He has wasting of interosseous muscle between the thumb and index finger on  both hands.  He has decreased sensation to gross touch on the right fifth digit. Right hip: Significant decreased range of motion due to pain. Extremities -lower extremity edema.  Results for orders placed or performed in visit on 09/04/21  POCT glucose (manual entry)  Result Value Ref Range   POC Glucose 160 (A) 70 - 99 mg/dl  POCT glycosylated hemoglobin (Hb A1C)  Result Value Ref Range   Hemoglobin A1C     HbA1c POC (<> result,  manual entry)     HbA1c, POC (prediabetic range)     HbA1c, POC (controlled diabetic range) 6.8 0.0 - 7.0 %    CMP Latest Ref Rng & Units 07/20/2020 05/20/2020 02/20/2020  Glucose 70 - 99 mg/dL 213(H) 126(H) 108(H)  BUN 6 - 20 mg/dL _0 Creatinine 0.61 - 1.24 mg/dL 0.98 0.62 0.83  Sodium 135 - 145 mmol/L 135 133(L) 138  Potassium 3.5 - 5.1 mmol/L 3.8 3.7 4.2  Chloride 98 - 111 mmol/L 102 102 105  CO2 22 - 32 mmol/L 24 20(L) 24  Calcium 8.9 - 10.3 mg/dL 9.1 8.8(L) 9.3  Total Protein 6.5 - 8.1 g/dL 7.7 - 7.0  Total Bilirubin 0.3 - 1.2 mg/dL 1.4(H) - 0.8  Alkaline Phos 38 - 126 U/L 41 - 53  AST 15 - 41 U/L 71(H) - 23  ALT 0 - 44 U/L 70(H) - 29   Lipid Panel     Component Value Date/Time   CHOL 115 02/11/2020 0644   TRIG 85 02/11/2020 0644   HDL 47 02/11/2020 0644   CHOLHDL 2.4 02/11/2020 0644   VLDL 17 02/11/2020 0644   LDLCALC 51 02/11/2020 0644    CBC    Component Value Date/Time   WBC 7.8 07/20/2020 0931   RBC 6.46 (H) 07/20/2020 0931   HGB 15.4 07/20/2020 0931   HGB 15.3 11/23/2019 0951   HCT 48.6 07/20/2020 0931   HCT 49.0 11/23/2019 0951   PLT 359 07/20/2020 0931   PLT 267 11/23/2019 0951   MCV 75.2 (L) 07/20/2020 0931   MCV 76 (L) 11/23/2019 0951   MCH 23.8 (L) 07/20/2020 0931   MCHC 31.7 07/20/2020 0931   RDW 15.5 07/20/2020 0931   RDW 15.0 11/23/2019 0951   LYMPHSABS 2.8 02/20/2020 2220   MONOABS 0.6 02/20/2020 2220   EOSABS 0.4 02/20/2020 2220   BASOSABS 0.0 02/20/2020 2220    ASSESSMENT AND PLAN: 1. Chronic hip  pain, right Suspect either osteoarthritis or avascular necrosis due to him being on prednisone chronically in the past.  We will start with baseline x-rays.  I have placed him on Celebrex.  Further management will be based on results of the x-rays. - DG Hip Unilat W OR W/O Pelvis 2-3 Views Right; Future - celecoxib (CELEBREX) 200 MG capsule; Take 1 capsule (200 mg total) by mouth daily.  Dispense: 30 capsule; Refill: 1  2. Ulnar neuropathy of right upper extremity Advised to apply for the orange card/cone discount.  Once approved we will send him for nerve conduction study.  Not uncommon to have compressive nephropathy in patients with acromegaly.  3. Diabetes mellitus type 2 in obese ([place]) Controlled considering he has been off metformin for 6 months.  We will restart metformin at low-dose of 500 mg once a day.  Discussed and encourage healthy eating habits. - CBC - Comprehensive metabolic panel - Lipid panel - Microalbumin / creatinine urine ratio - metFORMIN (GLUCOPHAGE) 500 MG tablet; Take 1 tablet (500 mg total) by mouth daily with breakfast.  Dispense: 30 tablet; Refill: 3 - POCT glucose (manual entry) - POCT glycosylated hemoglobin (Hb A1C)  4. Essential hypertension Not at goal.  He has been off medicines for 6 months.  At this time I will restart him on just Cozaar. - losartan (COZAAR) 25 MG tablet; Take 1 tablet (25 mg total) by mouth daily.  Dispense: 30 tablet; Refill: 3  5. Secondary adrenal insufficiency (HCC) Given his history of adrenal insufficiency and acromegaly, I  recommend referral to endocrinology.  He will apply for the orange card/cone discount.  Restart low-dose prednisone - Cortisol - Ambulatory referral to Endocrinology - predniSONE (DELTASONE) 5 MG tablet; Take 1 tablet (5 mg total) by mouth daily with breakfast.  Dispense: 30 tablet; Refill: 0  6. H/O acromegaly  - Ambulatory referral to Endocrinology  7. 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  Patient verbalized understanding of the plan and was able to repeat key elements of the plan.   Addendum: Pt with mild anemia:  add iron studies.  Orders Placed This Encounter  Procedures   DG Hip Unilat W OR W/O Pelvis 2-3 Views Right   CBC   Comprehensive metabolic panel   Lipid panel   Microalbumin / creatinine urine ratio   Cortisol   Ambulatory referral to Endocrinology   POCT glucose (manual entry)   POCT glycosylated hemoglobin (Hb A1C)     Requested Prescriptions   Signed Prescriptions Disp Refills   metFORMIN (GLUCOPHAGE) 500 MG tablet 30 tablet 3    Sig: Take 1 tablet (500 mg total) by mouth daily with breakfast.   losartan (COZAAR) 25 MG tablet 30 tablet 3    Sig: Take 1 tablet (25 mg total) by mouth daily.   celecoxib (CELEBREX) 200 MG capsule 30 capsule 1    Sig: Take 1 capsule (200 mg total) by mouth daily.   predniSONE (DELTASONE) 5 MG tablet 30 tablet 0    Sig: Take 1 tablet (5 mg total) by mouth daily with breakfast.    Return in about 4 months (around 01/02/2022).  Karle Plumber, MD, FACP

## 2021-09-04 NOTE — Patient Instructions (Signed)
Let me know once you have been approved for the orange card/cone discount card so that we can submit the referral for you to see the endocrinologist And for you to get the nerve conduction study done on your arms.

## 2021-09-05 LAB — COMPREHENSIVE METABOLIC PANEL
ALT: 23 IU/L (ref 0–44)
AST: 24 IU/L (ref 0–40)
Albumin/Globulin Ratio: 1.5 (ref 1.2–2.2)
Albumin: 4 g/dL (ref 4.0–5.0)
Alkaline Phosphatase: 63 IU/L (ref 44–121)
BUN/Creatinine Ratio: 15 (ref 9–20)
BUN: 11 mg/dL (ref 6–24)
Bilirubin Total: 0.3 mg/dL (ref 0.0–1.2)
CO2: 23 mmol/L (ref 20–29)
Calcium: 9.5 mg/dL (ref 8.7–10.2)
Chloride: 106 mmol/L (ref 96–106)
Creatinine, Ser: 0.73 mg/dL — ABNORMAL LOW (ref 0.76–1.27)
Globulin, Total: 2.6 g/dL (ref 1.5–4.5)
Glucose: 131 mg/dL — ABNORMAL HIGH (ref 70–99)
Potassium: 4.3 mmol/L (ref 3.5–5.2)
Sodium: 141 mmol/L (ref 134–144)
Total Protein: 6.6 g/dL (ref 6.0–8.5)
eGFR: 113 mL/min/{1.73_m2} (ref >59)

## 2021-09-05 LAB — CORTISOL: Cortisol: 7 ug/dL

## 2021-09-05 LAB — LIPID PANEL
Chol/HDL Ratio: 3.5 ratio (ref 0.0–5.0)
Cholesterol, Total: 138 mg/dL (ref 100–199)
HDL: 40 mg/dL (ref >39)
LDL Chol Calc (NIH): 81 mg/dL (ref 0–99)
Triglycerides: 90 mg/dL (ref 0–149)
VLDL Cholesterol Cal: 17 mg/dL (ref 5–40)

## 2021-09-05 LAB — CBC
Hematocrit: 39 % (ref 37.5–51.0)
Hemoglobin: 12.2 g/dL — ABNORMAL LOW (ref 13.0–17.7)
MCH: 23.8 pg — ABNORMAL LOW (ref 26.6–33.0)
MCHC: 31.3 g/dL — ABNORMAL LOW (ref 31.5–35.7)
MCV: 76 fL — ABNORMAL LOW (ref 79–97)
Platelets: 276 10*3/uL (ref 150–450)
RBC: 5.12 x10E6/uL (ref 4.14–5.80)
RDW: 14.4 % (ref 11.6–15.4)
WBC: 7.9 10*3/uL (ref 3.4–10.8)

## 2021-09-05 LAB — MICROALBUMIN / CREATININE URINE RATIO
Creatinine, Urine: 184.8 mg/dL
Microalb/Creat Ratio: 40 mg/g creat — ABNORMAL HIGH (ref 0–29)
Microalbumin, Urine: 73.4 ug/mL

## 2021-09-06 ENCOUNTER — Other Ambulatory Visit: Payer: Self-pay | Admitting: Internal Medicine

## 2021-09-06 DIAGNOSIS — E2749 Other adrenocortical insufficiency: Secondary | ICD-10-CM

## 2021-09-06 NOTE — Addendum Note (Signed)
Addended by: Karle Plumber B on: 09/06/2021 11:31 AM   Modules accepted: Orders

## 2021-09-06 NOTE — Progress Notes (Signed)
Let pt know that he has a mild anemia.  Will ask lab to check iron level on blood that has already been drawn.  Kidney and liver function tests normal.  Cholesterol level good. Small amount of protein in the urine related to diabetes.  BP medication Losartan that was prescribed will help protect the kidneys.  Hormone called Cortisol level in low normal range.  Before he starts taking the Prednisone, I would like for him to return to the lab one morning this week when it opens at 8 a.m to have Cortisol and other hormone levels rechecked.

## 2021-09-08 ENCOUNTER — Telehealth: Payer: Self-pay

## 2021-09-08 NOTE — Telephone Encounter (Signed)
Contacted pt to go over lab results pt states he understands. Pt states he will come by one day this week to get lab done

## 2021-09-10 ENCOUNTER — Other Ambulatory Visit: Payer: Self-pay | Admitting: Internal Medicine

## 2021-09-10 ENCOUNTER — Other Ambulatory Visit: Payer: Self-pay

## 2021-09-10 LAB — IRON,TIBC AND FERRITIN PANEL
Ferritin: 86 ng/mL (ref 30–400)
Iron Saturation: 18 % (ref 15–55)
Iron: 56 ug/dL (ref 38–169)
Total Iron Binding Capacity: 307 ug/dL (ref 250–450)
UIBC: 251 ug/dL (ref 111–343)

## 2021-09-10 LAB — SPECIMEN STATUS REPORT

## 2021-09-10 MED ORDER — FERROUS SULFATE 325 (65 FE) MG PO TABS
ORAL_TABLET | ORAL | 1 refills | Status: DC
  Filled 2021-09-10: qty 8, 30d supply, fill #0
  Filled 2021-10-28: qty 8, 30d supply, fill #1

## 2021-09-10 NOTE — Progress Notes (Signed)
Let patient know that iron studies reveal he may have early iron deficiency.  I recommend taking iron supplement 2-3 times a week.  I will send that prescription to the pharmacy.  He will also need to have colonoscopy for colon cancer screening.  Please let me know once he has been approved for the orange card/cone discount card so that I can submit that referral.

## 2021-09-11 ENCOUNTER — Telehealth: Payer: Self-pay

## 2021-09-11 ENCOUNTER — Ambulatory Visit: Payer: Self-pay | Attending: Internal Medicine

## 2021-09-11 ENCOUNTER — Ambulatory Visit (HOSPITAL_COMMUNITY)
Admission: RE | Admit: 2021-09-11 | Discharge: 2021-09-11 | Disposition: A | Discharge status: Home / Self Care | Payer: 59 | Source: Ambulatory Visit | Attending: Internal Medicine | Admitting: Internal Medicine

## 2021-09-11 ENCOUNTER — Other Ambulatory Visit: Payer: Self-pay

## 2021-09-11 DIAGNOSIS — G8929 Other chronic pain: Secondary | ICD-10-CM | POA: Insufficient documentation

## 2021-09-11 DIAGNOSIS — M25551 Pain in right hip: Secondary | ICD-10-CM | POA: Diagnosis present

## 2021-09-11 DIAGNOSIS — E2749 Other adrenocortical insufficiency: Secondary | ICD-10-CM

## 2021-09-11 NOTE — Telephone Encounter (Signed)
Contacted pt to go over lab results pt didn't answer lvm   Sent a CRM and forward labs to NT to give pt labs when they call back   

## 2021-09-11 NOTE — Progress Notes (Signed)
Let patient know that x-ray of the right hip showed degenerative arthritis changes.  Continue the Celebrex.  Let me know once he has been approved for the orange card/cone discount card and we can refer him to orthopedics.

## 2021-09-14 LAB — ACTH: ACTH: <1.5 pg/mL — ABNORMAL LOW (ref 7.2–63.3)

## 2021-09-14 LAB — DHEA-SULFATE: DHEA-SO4: 62 ug/dL — ABNORMAL LOW (ref 71.6–375.4)

## 2021-09-14 LAB — CORTISOL-AM, BLOOD: Cortisol - AM: 2.3 ug/dL — ABNORMAL LOW (ref 6.2–19.4)

## 2021-09-14 NOTE — Progress Notes (Signed)
Let patient know that his hormone levels are low.  He should go ahead and restart the prednisone.

## 2021-09-15 ENCOUNTER — Telehealth: Payer: Self-pay

## 2021-09-15 NOTE — Telephone Encounter (Signed)
Contacted pt to go over lab results pt didn't answer phone kept going to busy signal    Sent a CRM and forward labs to NT to give pt labs when they call back

## 2021-09-18 ENCOUNTER — Telehealth: Payer: Self-pay | Admitting: *Deleted

## 2021-09-18 DIAGNOSIS — M1611 Unilateral primary osteoarthritis, right hip: Secondary | ICD-10-CM

## 2021-09-18 NOTE — Addendum Note (Signed)
Addended by: Karle Plumber B on: 09/18/2021 06:23 PM   Modules accepted: Orders

## 2021-09-18 NOTE — Telephone Encounter (Signed)
Patient is calling provider to inform her that he has not improved with prednisone and Celebrex with the hip pain. He is still having to accommodate when climbing stairs and walking. Patient states he is losing mobility in R fingers and hand. Patient is unable to completely straighten 4 and 5 digit. Patient wants to know what next steps would be- he has follow up- but it is 3/21- too far out.

## 2021-09-23 NOTE — Telephone Encounter (Signed)
Contacted pt and lvm

## 2021-09-29 ENCOUNTER — Ambulatory Visit: Payer: 59 | Admitting: Orthopaedic Surgery

## 2021-09-29 NOTE — Progress Notes (Signed)
Pt spoke with Opal Sidles RN on 09/18/2021 . Message was delivered

## 2021-10-01 ENCOUNTER — Encounter (HOSPITAL_COMMUNITY): Payer: Self-pay | Admitting: Psychiatry

## 2021-10-01 ENCOUNTER — Other Ambulatory Visit: Payer: Self-pay

## 2021-10-01 ENCOUNTER — Telehealth (INDEPENDENT_AMBULATORY_CARE_PROVIDER_SITE_OTHER): Payer: 59 | Admitting: Psychiatry

## 2021-10-01 DIAGNOSIS — F251 Schizoaffective disorder, depressive type: Secondary | ICD-10-CM | POA: Diagnosis not present

## 2021-10-01 DIAGNOSIS — F411 Generalized anxiety disorder: Secondary | ICD-10-CM

## 2021-10-01 MED ORDER — BENZTROPINE MESYLATE 1 MG PO TABS
1.0000 mg | ORAL_TABLET | Freq: Two times a day (BID) | ORAL | 0 refills | Status: DC
  Filled 2021-10-01: qty 60, 30d supply, fill #0

## 2021-10-01 MED ORDER — QUETIAPINE FUMARATE 50 MG PO TABS
100.0000 mg | ORAL_TABLET | Freq: Every day | ORAL | 3 refills | Status: DC
  Filled 2021-10-01: qty 30, 15d supply, fill #0

## 2021-10-01 MED ORDER — LAMOTRIGINE 25 MG PO TABS
25.0000 mg | ORAL_TABLET | Freq: Every day | ORAL | 2 refills | Status: DC
  Filled 2021-10-01: qty 30, 30d supply, fill #0

## 2021-10-01 MED ORDER — GABAPENTIN 400 MG PO CAPS
400.0000 mg | ORAL_CAPSULE | Freq: Three times a day (TID) | ORAL | 3 refills | Status: DC
  Filled 2021-10-01: qty 90, 30d supply, fill #0

## 2021-10-01 MED ORDER — BENZTROPINE MESYLATE 1 MG PO TABS
1.0000 mg | ORAL_TABLET | Freq: Two times a day (BID) | ORAL | 3 refills | Status: DC
  Filled 2021-10-01: qty 60, 30d supply, fill #0

## 2021-10-01 NOTE — Progress Notes (Signed)
[facility] MD/PA/NP OP Progress Note Virtual Visit via Telephone Note  I connected with Duane Salazar on 10/01/21 at  3:00 PM EST by telephone and verified that I am speaking with the correct person using two identifiers.  Location: Patient: home Provider: Clinic   I discussed the limitations, risks, security and privacy concerns of performing an evaluation and management service by telephone and the availability of in person appointments. I also discussed with the patient that there may be a patient responsible charge related to this service. The patient expressed understanding and agreed to proceed.   I provided 30 minutes of non-face-to-face time during this encounter.  10/01/2021 12:25 PM Duane Salazar  MRN:  [id number]  Chief Complaint: "My medication are not working. I feel drowsy and high on Seroquel"  HPI: 46 year old male seen today for follow up psychiatric evaluation. He has a psychiatric history of schizoaffective disorder, MDD, and poly substance use (marijuana, alcohol, and cocaine, and tobacco use). Currently he is managed on Seroquel 100 mg nightly and gabapentin 300 mg 3 times daily.  He notes his medications are somewhat effective in managing his psychiatric conditions.  Today he was unable to login virtually so his assessment was done over the phone. During exam he was pleasant, cooperative, and engaged in conversation. He informed Probation officer that his medications are ineffective.  He reports that Seroquel helps him get approximately 4 to 5 hours nightly of sleep however reports that it makes him feel groggy in the morning.  He requested writer reduce dose.  Patient also notes that he continues to feel paranoid.  He informed Probation officer that he is always looking over his shoulder as he believes someone is out to get him.  Provider informed patient that Seroquel is reduced he may experience more paranoia.  He endorsed understanding however notes that he wants to reduce.  Patient reports that at  night he is more depressed.  He reports that he sits and think about his past failures.  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 Today he endorses passive SI however note that he would not harm himself.  He denies SI/HI/VAH.   Patient notes that the above exacerbates his anxiety and depression.  Provider conducted a GAD-7 and patient scored a 12, at his last visit he scored a 17.  Provider also conducted PHQ-9 and patient scored a 23, at his last visit he scored a 23.  He endorses adequate appetite and notes that he has lost over 30 pounds with diet and exercise.   Patient notes that he is in constant pain.  He notes that he has degenerative disc disease, joint aches, and reports that he has had multiple surgeries on his brain.  He reports the gabapentin is somewhat effective in managing his pain.  He also follows up with [facility] for primary care.  Provider asked patient if he wanted to switch antipsychotics Lorayne Bender) however he notes that he did not.  He reports that he found Haldol most effective but reports that it caused EPS.  Risperdal was also ineffective.  Today patient agreeable to reducing Seroquel to 50 mg nightly.  He will start Lamictal 25 mg for 2 weeks, increase to to 50 mg for 2 weeks, and then 75 to 100 mg, Patient also agreeable to increase gabapentin 300 mg 3 times  daily to 400 mg 3 times daily to help manage anxiety, mood, and pain.   Visit Diagnosis:    ICD-10-CM   1. Schizoaffective disorder, depressive type ([place])  F25.1 benztropine (COGENTIN) 1 MG tablet    QUEtiapine (SEROQUEL) 50 MG tablet    lamoTRIgine (LAMICTAL) 25 MG tablet    2. 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)    Sleep apnea 1995   on CPAP    Past Surgical History:  Procedure Laterality Date   PITUITARY SURGERY  2005 & 2012   SKIN BIOPSY      Family Psychiatric History: Maternal uncle and Grandfather Substance use, mother substance use, depression, and anxiety  Family History:  Family History  Problem Relation Age of Onset   Hypertension Mother    Diabetes Mother    Heart Problems Mother    Cancer Maternal Uncle    Alcoholism Maternal Uncle    Cancer Maternal Grandmother    Heart disease Neg Hx     Social History:  Social History   Socioeconomic History   Marital status: Single    Spouse name: Not on file   Number of children: 2   Years of education: GED   Highest education level: Not on file  Occupational History   Not on file  Tobacco Use   Smoking status: Every Day    Packs/day: 1.00    Years: 20.00    Pack years: 20.00    Types: Cigarettes   Smokeless tobacco: Never   Tobacco comments:    Smoking .5 ppd  Substance and Sexual Activity   Alcohol use: Yes    Alcohol/week: 2.0 standard drinks    Types: 2 Cans of beer per week    Comment: 2 40s a day   Drug use: Yes    Types: Cocaine, Marijuana, Heroin    Comment: Reports used 3 weeks ago. 10/17/2015   Sexual activity: Yes  Other Topics Concern   Not on file  Social History Narrative   Lives with mom.   Incarcerated for 22 months in [place], [place]. From 2014-09/2014   Social Determinants of Health   Financial Resource Strain: Not on file   Food Insecurity: Not on file  Transportation Needs: Not on file  Physical Activity: Not on file  Stress: Not on file  Social Connections: Not on file    Allergies: No Known Allergies  Metabolic Disorder Labs: Lab Results  Component Value Date   HGBA1C 6.8 09/04/2021   No results found for: PROLACTIN Lab Results  Component Value Date   CHOL 138 09/04/2021   TRIG 90 09/04/2021   HDL 40 09/04/2021   CHOLHDL 3.5 09/04/2021   VLDL 17 02/11/2020   LDLCALC 81 09/04/2021   LDLCALC 51 02/11/2020   Lab Results  Component Value Date   TSH 0.575 11/23/2019   TSH 0.449 09/15/2015  Therapeutic Level Labs: No results found for: LITHIUM No results found for: VALPROATE No components found for:  CBMZ  Current Medications: Current Outpatient Medications  Medication Sig Dispense Refill   lamoTRIgine (LAMICTAL) 25 MG tablet Take 1 tablet (25 mg total) by mouth daily. 120 tablet 2   benztropine (COGENTIN) 1 MG tablet Take 1 tablet (1 mg total) by mouth 2 (two) times daily. 60 tablet 0   celecoxib (CELEBREX) 200 MG capsule Take 1 capsule (200 mg total) by mouth daily. 30 capsule 1   ferrous sulfate 325 (65 FE) MG tablet Take 1 tablet by mouth every Monday and Friday 100 tablet 1   gabapentin (NEURONTIN) 400 MG capsule Take 1 capsule (400 mg total) by mouth 3 (three) times daily. 90 capsule 3   glucose monitoring kit (FREESTYLE) monitoring kit 1 each by Does not apply route daily. Check glucose once in the morning before breakfast and 1 hour after a meal (Patient not taking: Reported on 02/07/2021) 1 each 0   losartan (COZAAR) 25 MG tablet Take 1 tablet (25 mg total) by mouth daily. 30 tablet 3   metFORMIN (GLUCOPHAGE) 500 MG tablet Take 1 tablet (500 mg total) by mouth daily with breakfast. 30 tablet 3   predniSONE (DELTASONE) 5 MG tablet Take 1 tablet (5 mg total) by mouth daily with breakfast. 30 tablet 0   QUEtiapine (SEROQUEL) 50 MG tablet Take 2 tablets (100 mg total) by mouth at  bedtime. 30 tablet 3   No current facility-administered medications for this visit.     Musculoskeletal: Strength & Muscle Tone:  Unable to assess due to telehealth visit [place]:  Unable to assess due to telehealth visit Patient leans: N/A  Psychiatric Specialty Exam: Review of Systems  There were no vitals taken for this visit.There is no height or weight on file to calculate BMI.  General Appearance:  Unable to assess due to telehealth visit  Eye Contact:   Unable to assess due to telehealth visit  Speech:  Clear and Coherent and Normal Rate  Volume:  Normal  Mood:  Anxious and Depressed  Affect:  Appropriate and Congruent  Thought Process:  Coherent, Goal Directed, and Linear  Orientation:  Full (Time, Place, and Person)  Thought Content: Logical and Paranoid Ideation   Suicidal Thoughts:  Yes.  without intent/plan  Homicidal Thoughts:  No  Memory:  Immediate;   Good Recent;   Good Remote;   Good  Judgement:  Good  Insight:  Good  Psychomotor Activity:   Unable to assess due to telephone visit  Concentration:  Concentration: Fair and Attention Span: Fair  Recall:  Good  Fund of Knowledge: Good  Language: Good  Akathisia:   Unable to assess due to telephone visit  Handed:  Left  AIMS (if indicated): not done  Assets:  Communication Skills Desire for Improvement Housing Intimacy Leisure Time Physical Health Social Support  ADL's:  Intact  Cognition: WNL  Sleep:  Fair   Screenings: AIMS    [place] Admission (Discharged) from OP Visit from 02/10/2020 in [facility] Admission (Discharged) from 09/18/2015 in [facility] Admission (Discharged) from 07/06/2015 in [facility] Total Score 0 0 0      AUDIT    Flowsheet Row Admission (Discharged) from OP Visit from 02/10/2020 in [facility] Admission (Discharged) from 09/18/2015  in [facility] Admission (Discharged) from  07/06/2015 in [facility] Admission (Discharged) from 06/27/2015 in [facility]  Alcohol Use Disorder Identification Test Final Score (AUDIT) 3 8 39 31      GAD-7    Flowsheet Row Video Visit from 10/01/2021 in [facility] Office Visit from 09/04/2021 in [facility] Video Visit from 07/09/2021 in [facility] Office Visit from 11/23/2019 in [facility] Office Visit from 10/17/2015 in [facility]  Total GAD-7 Score _0 PHQ2-9    Flowsheet Row Video Visit from 10/01/2021 in [facility] Office Visit from 09/04/2021 in [facility] Video Visit from 07/09/2021 in [facility] Office Visit from 11/23/2019 in [facility] Office Visit from 10/22/2015 in [facility]  PHQ-2 Total Score _1 0  PHQ-9 Total Score _2 --      Flowsheet Row Video Visit from 10/01/2021 in [facility] ED from 02/07/2021 in [facility] ED from 11/27/2020 in [facility] Error: Q7 should not be populated when Q6 is No No Risk No Risk        Assessment and Plan: Patient notes that his sleep somewhat improved since his last visit however notes that he continues to be irritable, anxious, depressed, and suffering from symptoms of hypomania.  He inform Probation officer that Seroquel is over sedating and makes him feel groggy.  Patient requested Seroquel be reduced.  Provider offered patient Lorayne Bender to help manage above symptoms but he was not interested.  Seroquel reduced to 50 mg nightly.  Patient also agreeable to  increasing gabapentin 300 mg 3 times daily to 400 mg 3 times daily to help manage anxiety, pain, and mood.  He will also start Lamictal 25 mg for 2 weeks.  Patient instructed to increase Lamictal every 2 weeks by 25 mg until he reaches a maintenance dose of 100.  He endorsed understanding and agreed.  He will continue other medications as prescribed.  1. Schizoaffective disorder, depressive type ([place])  Continue- benztropine (COGENTIN) 1 MG tablet; Take 1 tablet (1 mg total) by mouth 2 (two) times daily.  Dispense: 60 tablet; Refill: 3 Reduce- QUEtiapine (SEROQUEL) 50 MG tablet; Take 2 tablets (100 mg total) by mouth at bedtime.  Dispense: 30 tablet; Refill: 3 Continue- lamoTRIgine (LAMICTAL) 25 MG tablet; Take 1 tablet (25 mg total) by mouth daily.  Dispense: 120 tablet; Refill: 2  2. Generalized anxiety disorder  Increase- gabapentin (NEURONTIN) 400 MG capsule; Take 1 capsule (400 mg total) by mouth 3 (three) times daily.  Dispense: 90 capsule; Refill: 3 Continue- QUEtiapine (SEROQUEL) 50 MG tablet; Take 2 tablets (100 mg total) by mouth at bedtime.  Dispense: 30 tablet; Refill: 3   Follow up in 3 weeks Follow up with therapy Salley Slaughter, NP 10/01/2021, 12:25 PM

## 2021-10-08 ENCOUNTER — Other Ambulatory Visit: Payer: Self-pay

## 2021-10-26 ENCOUNTER — Encounter (HOSPITAL_COMMUNITY): Payer: Self-pay

## 2021-10-26 ENCOUNTER — Telehealth (HOSPITAL_COMMUNITY): Payer: 59 | Admitting: Psychiatry

## 2021-10-28 ENCOUNTER — Other Ambulatory Visit: Payer: Self-pay

## 2021-10-28 ENCOUNTER — Encounter (HOSPITAL_COMMUNITY): Payer: Self-pay | Admitting: Emergency Medicine

## 2021-10-28 ENCOUNTER — Emergency Department (HOSPITAL_COMMUNITY): Payer: 59

## 2021-10-28 ENCOUNTER — Emergency Department (HOSPITAL_COMMUNITY)
Admission: EM | Admit: 2021-10-28 | Discharge: 2021-10-29 | Disposition: A | Discharge status: Other Acute Inpatient Hospital | Payer: 59 | Attending: Emergency Medicine | Admitting: Emergency Medicine

## 2021-10-28 DIAGNOSIS — R202 Paresthesia of skin: Secondary | ICD-10-CM | POA: Insufficient documentation

## 2021-10-28 DIAGNOSIS — R7401 Elevation of levels of liver transaminase levels: Secondary | ICD-10-CM | POA: Diagnosis not present

## 2021-10-28 DIAGNOSIS — Z046 Encounter for general psychiatric examination, requested by authority: Secondary | ICD-10-CM | POA: Diagnosis present

## 2021-10-28 DIAGNOSIS — Z20822 Contact with and (suspected) exposure to covid-19: Secondary | ICD-10-CM | POA: Diagnosis not present

## 2021-10-28 DIAGNOSIS — F1510 Other stimulant abuse, uncomplicated: Secondary | ICD-10-CM | POA: Diagnosis not present

## 2021-10-28 DIAGNOSIS — E871 Hypo-osmolality and hyponatremia: Secondary | ICD-10-CM | POA: Insufficient documentation

## 2021-10-28 DIAGNOSIS — F251 Schizoaffective disorder, depressive type: Secondary | ICD-10-CM | POA: Diagnosis not present

## 2021-10-28 DIAGNOSIS — F1920 Other psychoactive substance dependence, uncomplicated: Secondary | ICD-10-CM | POA: Insufficient documentation

## 2021-10-28 DIAGNOSIS — F1410 Cocaine abuse, uncomplicated: Secondary | ICD-10-CM | POA: Insufficient documentation

## 2021-10-28 DIAGNOSIS — M79641 Pain in right hand: Secondary | ICD-10-CM | POA: Insufficient documentation

## 2021-10-28 DIAGNOSIS — R45851 Suicidal ideations: Secondary | ICD-10-CM

## 2021-10-28 DIAGNOSIS — F1990 Other psychoactive substance use, unspecified, uncomplicated: Secondary | ICD-10-CM

## 2021-10-28 DIAGNOSIS — Z9114 Patient's other noncompliance with medication regimen: Secondary | ICD-10-CM

## 2021-10-28 DIAGNOSIS — F1910 Other psychoactive substance abuse, uncomplicated: Secondary | ICD-10-CM

## 2021-10-28 DIAGNOSIS — R739 Hyperglycemia, unspecified: Secondary | ICD-10-CM | POA: Insufficient documentation

## 2021-10-28 LAB — COMPREHENSIVE METABOLIC PANEL
ALT: 36 U/L (ref 0–44)
AST: 46 U/L — ABNORMAL HIGH (ref 15–41)
Albumin: 4 g/dL (ref 3.5–5.0)
Alkaline Phosphatase: 49 U/L (ref 38–126)
Anion gap: 9 (ref 5–15)
BUN: 12 mg/dL (ref 6–20)
CO2: 24 mmol/L (ref 22–32)
Calcium: 9.3 mg/dL (ref 8.9–10.3)
Chloride: 99 mmol/L (ref 98–111)
Creatinine, Ser: 0.7 mg/dL (ref 0.61–1.24)
GFR, Estimated: >60 mL/min (ref >60)
Glucose, Bld: 136 mg/dL — ABNORMAL HIGH (ref 70–99)
Potassium: 4.1 mmol/L (ref 3.5–5.1)
Sodium: 132 mmol/L — ABNORMAL LOW (ref 135–145)
Total Bilirubin: 0.5 mg/dL (ref 0.3–1.2)
Total Protein: 7.4 g/dL (ref 6.5–8.1)

## 2021-10-28 LAB — RAPID URINE DRUG SCREEN, HOSP PERFORMED
Amphetamines: POSITIVE — AB
Barbiturates: NOT DETECTED
Benzodiazepines: NOT DETECTED
Cocaine: POSITIVE — AB
Opiates: NOT DETECTED
Tetrahydrocannabinol: POSITIVE — AB

## 2021-10-28 LAB — CBC WITH DIFFERENTIAL/PLATELET
Abs Immature Granulocytes: 0.02 10*3/uL (ref 0.00–0.07)
Basophils Absolute: 0.1 10*3/uL (ref 0.0–0.1)
Basophils Relative: 1 %
Eosinophils Absolute: 0.3 10*3/uL (ref 0.0–0.5)
Eosinophils Relative: 3 %
HCT: 45.1 % (ref 39.0–52.0)
Hemoglobin: 14.1 g/dL (ref 13.0–17.0)
Immature Granulocytes: 0 %
Lymphocytes Relative: 27 %
Lymphs Abs: 2.1 10*3/uL (ref 0.7–4.0)
MCH: 23.5 pg — ABNORMAL LOW (ref 26.0–34.0)
MCHC: 31.3 g/dL (ref 30.0–36.0)
MCV: 75 fL — ABNORMAL LOW (ref 80.0–100.0)
Monocytes Absolute: 0.8 10*3/uL (ref 0.1–1.0)
Monocytes Relative: 10 %
Neutro Abs: 4.6 10*3/uL (ref 1.7–7.7)
Neutrophils Relative %: 59 %
Platelets: 295 10*3/uL (ref 150–400)
RBC: 6.01 MIL/uL — ABNORMAL HIGH (ref 4.22–5.81)
RDW: 14.9 % (ref 11.5–15.5)
WBC: 7.9 10*3/uL (ref 4.0–10.5)
nRBC: 0 % (ref 0.0–0.2)

## 2021-10-28 LAB — RESP PANEL BY RT-PCR (FLU A&B, COVID) ARPGX2
Influenza A by PCR: NEGATIVE
Influenza B by PCR: NEGATIVE
SARS Coronavirus 2 by RT PCR: NEGATIVE

## 2021-10-28 LAB — ETHANOL: Alcohol, Ethyl (B): <10 mg/dL (ref <10)

## 2021-10-28 MED ORDER — PREDNISONE 5 MG PO TABS
5.0000 mg | ORAL_TABLET | Freq: Every day | ORAL | Status: DC
  Administered 2021-10-29: 5 mg via ORAL
  Filled 2021-10-28 (×3): qty 1

## 2021-10-28 MED ORDER — LOSARTAN POTASSIUM 50 MG PO TABS
25.0000 mg | ORAL_TABLET | Freq: Every day | ORAL | Status: DC
  Administered 2021-10-28 – 2021-10-29 (×2): 25 mg via ORAL
  Filled 2021-10-28 (×2): qty 1

## 2021-10-28 MED ORDER — GABAPENTIN 400 MG PO CAPS
400.0000 mg | ORAL_CAPSULE | Freq: Three times a day (TID) | ORAL | Status: DC
  Administered 2021-10-28 – 2021-10-29 (×4): 400 mg via ORAL
  Filled 2021-10-28 (×4): qty 1

## 2021-10-28 MED ORDER — ACETAMINOPHEN 325 MG PO TABS
650.0000 mg | ORAL_TABLET | Freq: Once | ORAL | Status: AC
Start: 2021-10-28 — End: 2021-10-28
  Administered 2021-10-28: 650 mg via ORAL
  Filled 2021-10-28: qty 2

## 2021-10-28 MED ORDER — QUETIAPINE FUMARATE 100 MG PO TABS
100.0000 mg | ORAL_TABLET | Freq: Every day | ORAL | Status: DC
  Administered 2021-10-28: 100 mg via ORAL
  Filled 2021-10-28 (×2): qty 1

## 2021-10-28 MED ORDER — LAMOTRIGINE 25 MG PO TABS
25.0000 mg | ORAL_TABLET | Freq: Every day | ORAL | Status: DC
  Administered 2021-10-28 – 2021-10-29 (×2): 25 mg via ORAL
  Filled 2021-10-28 (×3): qty 1

## 2021-10-28 MED ORDER — METFORMIN HCL 500 MG PO TABS
500.0000 mg | ORAL_TABLET | Freq: Every day | ORAL | Status: DC
  Administered 2021-10-29: 500 mg via ORAL
  Filled 2021-10-28: qty 1

## 2021-10-28 MED ORDER — BENZTROPINE MESYLATE 1 MG PO TABS
1.0000 mg | ORAL_TABLET | Freq: Two times a day (BID) | ORAL | Status: DC
  Administered 2021-10-28 – 2021-10-29 (×3): 1 mg via ORAL
  Filled 2021-10-28 (×4): qty 1

## 2021-10-28 MED ORDER — CELECOXIB 200 MG PO CAPS
200.0000 mg | ORAL_CAPSULE | Freq: Every day | ORAL | Status: DC
  Administered 2021-10-28 – 2021-10-29 (×2): 200 mg via ORAL
  Filled 2021-10-28 (×3): qty 1

## 2021-10-28 MED ORDER — GABAPENTIN 300 MG PO CAPS
300.0000 mg | ORAL_CAPSULE | Freq: Once | ORAL | Status: AC
  Administered 2021-10-28: 300 mg via ORAL
  Filled 2021-10-28: qty 1

## 2021-10-28 NOTE — ED Provider Notes (Signed)
[facility] EMERGENCY DEPARTMENT Provider Note   CSN: [id number] Arrival date & time: 10/28/21  1017     History  Chief Complaint  Patient presents with   Hand Pain    R    Duane Salazar is a 46 y.o. male.  HPI He presents for ongoing numbness in right hand and numbness fingers of left hand as well.  Symptoms on and off for several months.  He noticed them again this morning after using cocaine during the nighttime.  He feels that he has trouble moving his fingers of his left hand and feels that it is "locked up."  He denies recent trauma.  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  He is currently homeless.  He is not taking any of his medications.    Home Medications Prior to Admission medications   Medication Sig Start Date End Date Taking? Authorizing Provider  benztropine (COGENTIN) 1 MG tablet Take 1 tablet (1 mg total) by mouth 2 (two) times daily. 10/01/21   Salley Slaughter, NP  celecoxib (CELEBREX) 200 MG capsule Take 1 capsule (200 mg total) by mouth daily. 09/04/21   Ladell Pier, MD  ferrous sulfate 325 (65 FE) MG tablet Take 1 tablet by mouth every Monday and Friday 09/10/21   Ladell Pier, MD  gabapentin (NEURONTIN) 400 MG capsule Take 1 capsule (400 mg total) by mouth 3 (three) times daily. 10/01/21   Salley Slaughter, NP  glucose monitoring kit (FREESTYLE) monitoring kit 1 each by Does not apply route daily. Check glucose once in the morning before breakfast and 1 hour after a meal Patient not taking: Reported on 02/07/2021 08/28/20   Melynda Ripple, MD  lamoTRIgine (LAMICTAL) 25 MG tablet Take 1 tablet (25 mg total) by mouth daily. 10/01/21   Salley Slaughter, NP  losartan (COZAAR) 25 MG tablet Take 1 tablet (25 mg total) by mouth daily. 09/04/21   Ladell Pier, MD  metFORMIN (GLUCOPHAGE) 500 MG tablet  Take 1 tablet (500 mg total) by mouth daily with breakfast. 09/04/21   Ladell Pier, MD  predniSONE (DELTASONE) 5 MG tablet Take 1 tablet (5 mg total) by mouth daily with breakfast. 09/04/21   Ladell Pier, MD  QUEtiapine (SEROQUEL) 50 MG tablet Take 2 tablets (100 mg total) by mouth at bedtime. 10/01/21   Salley Slaughter, NP      Allergies    Patient has no known allergies.    Review of Systems   Review of Systems  Physical Exam Updated Vital Signs BP (!) 130/98    Pulse 81    Temp 97.9 F (36.6 C)    Resp 16    SpO2 99%  Physical Exam Vitals and nursing note reviewed.  Constitutional:      General: He is not in acute distress.    Appearance: He is well-developed. He is not ill-appearing, toxic-appearing or diaphoretic.  HENT:     Head: Normocephalic and atraumatic.     Right Ear: External ear normal.     Left Ear: External ear normal.  Eyes:     Conjunctiva/sclera: Conjunctivae normal.     Pupils: Pupils are equal, round, and reactive to light.  Neck:     Trachea: Phonation normal.  Cardiovascular:     Rate and Rhythm: Normal rate and regular rhythm.  Heart sounds: Normal heart sounds.  Pulmonary:     Effort: Pulmonary effort is normal.     Breath sounds: Normal breath sounds.  Abdominal:     General: There is no distension.     Palpations: Abdomen is soft.     Tenderness: There is no abdominal tenderness.  Musculoskeletal:        General: Normal range of motion.     Cervical back: Normal range of motion and neck supple.     Comments: Normal strength, arms and legs bilaterally.  Skin:    General: Skin is warm and dry.  Neurological:     Mental Status: He is alert and oriented to person, place, and time.     Cranial Nerves: No cranial nerve deficit.     Motor: No abnormal muscle tone.     Coordination: Coordination normal.     Comments: Noticed throughout, or aphasia.  He has decreased light touch sensation to the entire right hand and the tips of the  fingers of the left hand.  Psychiatric:        Mood and Affect: Mood normal.        Behavior: Behavior normal.        Thought Content: Thought content normal.        Judgment: Judgment normal.    ED Results / Procedures / Treatments   Labs (all labs ordered are listed, but only abnormal results are displayed) Labs Reviewed  COMPREHENSIVE METABOLIC PANEL - Abnormal; Notable for the following components:      Result Value   Sodium 132 (*)    Glucose, Bld 136 (*)    AST 46 (*)    All other components within normal limits  CBC WITH DIFFERENTIAL/PLATELET - Abnormal; Notable for the following components:   RBC 6.01 (*)    MCV 75.0 (*)    MCH 23.5 (*)    All other components within normal limits  RAPID URINE DRUG SCREEN, HOSP PERFORMED - Abnormal; Notable for the following components:   Cocaine POSITIVE (*)    Amphetamines POSITIVE (*)    Tetrahydrocannabinol POSITIVE (*)    All other components within normal limits  RESP PANEL BY RT-PCR (FLU A&B, COVID) ARPGX2  ETHANOL    EKG None  Radiology CT Head Wo Contrast  Result Date: 10/28/2021 CLINICAL DATA:  Weakness. EXAM: CT HEAD WITHOUT CONTRAST TECHNIQUE: Contiguous axial images were obtained from the base of the skull through the vertex without intravenous contrast. RADIATION DOSE REDUCTION: This exam was performed according to the departmental dose-optimization program which includes automated exposure control, adjustment of the mA and/or kV according to patient size and/or use of iterative reconstruction technique. COMPARISON:  September 11, 2015. FINDINGS: Brain: No evidence of acute infarction, hemorrhage, hydrocephalus, extra-axial collection or mass lesion/mass effect. Vascular: No hyperdense vessel or unexpected calcification. Skull: Normal. Negative for fracture or focal lesion. Sinuses/Orbits: No acute finding. Other: None. IMPRESSION: No acute intracranial abnormality seen. Electronically Signed   By: Marijo Conception M.D.   On:  10/28/2021 10:08    Procedures Procedures    Medications Ordered in ED Medications  benztropine (COGENTIN) tablet 1 mg (has no administration in time range)  celecoxib (CELEBREX) capsule 200 mg (has no administration in time range)  gabapentin (NEURONTIN) capsule 400 mg (has no administration in time range)  lamoTRIgine (LAMICTAL) tablet 25 mg (has no administration in time range)  metFORMIN (GLUCOPHAGE) tablet 500 mg (has no administration in time range)  losartan (COZAAR) tablet 25  mg (has no administration in time range)  predniSONE (DELTASONE) tablet 5 mg (has no administration in time range)  QUEtiapine (SEROQUEL) tablet 100 mg (has no administration in time range)  acetaminophen (TYLENOL) tablet 650 mg (650 mg Oral Given 10/28/21 1139)  gabapentin (NEURONTIN) capsule 300 mg (300 mg Oral Given 10/28/21 1139)    ED Course/ Medical Decision Making/ A&P Clinical Course as of 10/28/21 1618  Wed Oct 28, 2021  0926 Patient now has a secondary complaint.  "After being off my medications I am feeling suicidal.  I need to talk to someone about that." [EW]  1156 He has been evaluated by [facility], social work determined that he has a domicile, currently living with aunt.  Care management was informed by patient that he is taking his medicines and [facility].  She is going to contact them for corroboration of this discussion. [EW]  1156 He continues to assert that he is suicidal.  He does not state that he has a plan. [EW]    Clinical Course User Index [EW] Daleen Bo, MD                           Medical Decision Making He is presenting for evaluation of chronic ongoing numbness in both hands associated with use of cocaine.  His symptoms are ongoing.  Does not think he is currently prescribed medications for medical and psychiatric purposes.  Problems Addressed: Paresthesia: chronic illness or injury    Details: Bilateral hands Right hand pain:  acute illness or injury    Details: Movement causes pain especially of right fifth finger. Suicidal ideation: acute illness or injury    Details: Associated with recent substance abuse, overnight, he was using cocaine.  He states he has a domicile, staying with his aunt.  He initially stated that he was not taking his medications for medical conditions as well as psychiatric conditions but later stated that he was.  After being offered assistance with suggestions for ongoing medical care and referrals for substance abuse, he continued to alleges suicidality.  He did not have a plan.  Amount and/or Complexity of Data Reviewed Independent Historian:     Details: He is a cogent historian External Data Reviewed: notes.    Details: On 10/01/2021 he had medication management by psychiatric provider from the [facility] behavioral health department. Labs: ordered.    Details: CBC, metabolic panel, alcohol level, drug screen, viral panel-normal except sodium low, glucose high, AST high; urine drug screen positive for cocaine amphetamines and THC. Radiology: ordered and independent interpretation performed.    Details: CT head-no CVA or mass  Risk OTC drugs. Prescription drug management. Risk Details: Patient initially presenting with numbness and pain of hands.  No evidence for fracture, tenosynovitis, arthritis or cervical neuropathy.  Doubt CVA.  Patient apparently using drugs, primarily cocaine, and presenting with suicidal ideation.  He initially stated he was not using his psychiatric medicines but later stated he was.  There is no indication for acute problems causing numbness or pain in the right hand that would require hospitalization or further ED treatment or evaluation TTS consultation requested to evaluate suicidal ideation.  Drug screen positive for 3 substances of abuse.  Anticipate disposition after they assessed patient.           Final Clinical Impression(s) / ED  Diagnoses Final diagnoses:  Suicidal ideation  Paresthesia  Right hand pain  Polysubstance abuse (Hend[place])  Noncompliance with medications    Rx / DC Orders ED Discharge Orders     None         Daleen Bo, MD 10/28/21 [id number]

## 2021-10-28 NOTE — ED Triage Notes (Signed)
Pt c/o R hand numbness onset 2hrs ago. Pt endorses cocaine & alcohol use last night. Stroke screen clear, VSS ?

## 2021-10-28 NOTE — ED Notes (Signed)
Pt. Had 10 dollars worth of cocaine about 2 hours ago. ?

## 2021-10-28 NOTE — ED Notes (Signed)
Patient transported to CT 

## 2021-10-28 NOTE — BH Assessment (Addendum)
@  09323, requested Curt Bears, RN to set up the TTS machine for patient to be evaluated by TTS.  ? ?@1619 , patient's nurse Curt Bears, RN) responded. States that she is currently with a sick patient. Therefore, patient's nurse will check with the tech to see if he/she is able to assist in setting up the TTS machine. TTS Clinician awaiting updates from patient's nurse and/or tech.  ?
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
## 2021-10-28 NOTE — Discharge Planning (Signed)
RNCM consulted in regards to medication assistance.  Pt has insurance coverage and is not eligible for Medication Assistance Through [place] (MATCH) program. 

## 2021-10-28 NOTE — Progress Notes (Signed)
CSW attached shelter and substance abuse resources to patients AVS.  ?

## 2021-10-29 ENCOUNTER — Inpatient Hospital Stay (HOSPITAL_COMMUNITY)
Admission: AD | Admit: 2021-10-29 | Discharge: 2021-11-11 | DRG: 885 | Disposition: A | Discharge status: Home / Self Care | Payer: 59 | Source: Intra-hospital | Attending: Emergency Medicine | Admitting: Emergency Medicine

## 2021-10-29 DIAGNOSIS — E220 Acromegaly and pituitary gigantism: Secondary | ICD-10-CM | POA: Diagnosis present

## 2021-10-29 DIAGNOSIS — G5691 Unspecified mononeuropathy of right upper limb: Secondary | ICD-10-CM | POA: Diagnosis not present

## 2021-10-29 DIAGNOSIS — E119 Type 2 diabetes mellitus without complications: Secondary | ICD-10-CM | POA: Diagnosis present

## 2021-10-29 DIAGNOSIS — F1920 Other psychoactive substance dependence, uncomplicated: Secondary | ICD-10-CM | POA: Diagnosis present

## 2021-10-29 DIAGNOSIS — F251 Schizoaffective disorder, depressive type: Secondary | ICD-10-CM | POA: Diagnosis present

## 2021-10-29 DIAGNOSIS — T43651A Poisoning by methamphetamines accidental (unintentional), initial encounter: Secondary | ICD-10-CM | POA: Diagnosis present

## 2021-10-29 DIAGNOSIS — R45851 Suicidal ideations: Secondary | ICD-10-CM | POA: Diagnosis present

## 2021-10-29 DIAGNOSIS — Z79899 Other long term (current) drug therapy: Secondary | ICD-10-CM | POA: Diagnosis not present

## 2021-10-29 DIAGNOSIS — R29898 Other symptoms and signs involving the musculoskeletal system: Secondary | ICD-10-CM | POA: Diagnosis not present

## 2021-10-29 DIAGNOSIS — F411 Generalized anxiety disorder: Secondary | ICD-10-CM

## 2021-10-29 DIAGNOSIS — G8929 Other chronic pain: Secondary | ICD-10-CM

## 2021-10-29 DIAGNOSIS — G4700 Insomnia, unspecified: Secondary | ICD-10-CM | POA: Diagnosis present

## 2021-10-29 DIAGNOSIS — G629 Polyneuropathy, unspecified: Secondary | ICD-10-CM | POA: Diagnosis present

## 2021-10-29 DIAGNOSIS — M109 Gout, unspecified: Secondary | ICD-10-CM | POA: Diagnosis present

## 2021-10-29 DIAGNOSIS — F17210 Nicotine dependence, cigarettes, uncomplicated: Secondary | ICD-10-CM | POA: Diagnosis present

## 2021-10-29 DIAGNOSIS — D509 Iron deficiency anemia, unspecified: Secondary | ICD-10-CM | POA: Diagnosis present

## 2021-10-29 DIAGNOSIS — Z9151 Personal history of suicidal behavior: Secondary | ICD-10-CM | POA: Diagnosis not present

## 2021-10-29 DIAGNOSIS — F1990 Other psychoactive substance use, unspecified, uncomplicated: Secondary | ICD-10-CM

## 2021-10-29 DIAGNOSIS — Z7984 Long term (current) use of oral hypoglycemic drugs: Secondary | ICD-10-CM

## 2021-10-29 DIAGNOSIS — E2749 Other adrenocortical insufficiency: Secondary | ICD-10-CM

## 2021-10-29 DIAGNOSIS — F17200 Nicotine dependence, unspecified, uncomplicated: Secondary | ICD-10-CM | POA: Diagnosis present

## 2021-10-29 DIAGNOSIS — F1424 Cocaine dependence with cocaine-induced mood disorder: Secondary | ICD-10-CM | POA: Diagnosis present

## 2021-10-29 DIAGNOSIS — I10 Essential (primary) hypertension: Secondary | ICD-10-CM | POA: Diagnosis present

## 2021-10-29 DIAGNOSIS — F259 Schizoaffective disorder, unspecified: Principal | ICD-10-CM | POA: Diagnosis present

## 2021-10-29 LAB — CBG MONITORING, ED: Glucose-Capillary: 163 mg/dL — ABNORMAL HIGH (ref 70–99)

## 2021-10-29 MED ORDER — QUETIAPINE FUMARATE 50 MG PO TABS
50.0000 mg | ORAL_TABLET | Freq: Every day | ORAL | Status: DC
  Filled 2021-10-29: qty 1

## 2021-10-29 MED ORDER — PREDNISONE 5 MG PO TABS
5.0000 mg | ORAL_TABLET | Freq: Every day | ORAL | Status: DC
  Administered 2021-10-30 – 2021-11-11 (×13): 5 mg via ORAL
  Filled 2021-10-29 (×13): qty 1
  Filled 2021-10-29: qty 7
  Filled 2021-10-29: qty 1

## 2021-10-29 MED ORDER — CELECOXIB 100 MG PO CAPS
200.0000 mg | ORAL_CAPSULE | Freq: Every day | ORAL | Status: DC
  Administered 2021-10-30 – 2021-11-11 (×13): 200 mg via ORAL
  Filled 2021-10-29 (×14): qty 1
  Filled 2021-10-29: qty 14

## 2021-10-29 MED ORDER — MAGNESIUM HYDROXIDE 400 MG/5ML PO SUSP: 30.0000 mL | Freq: Every day | ORAL | Status: DC | PRN

## 2021-10-29 MED ORDER — ALUM & MAG HYDROXIDE-SIMETH 200-200-20 MG/5ML PO SUSP: 30.0000 mL | ORAL | Status: DC | PRN

## 2021-10-29 MED ORDER — LOSARTAN POTASSIUM 25 MG PO TABS
25.0000 mg | ORAL_TABLET | Freq: Every day | ORAL | Status: DC
  Administered 2021-10-30 – 2021-11-04 (×6): 25 mg via ORAL
  Filled 2021-10-29 (×7): qty 1

## 2021-10-29 MED ORDER — ACETAMINOPHEN 325 MG PO TABS
650.0000 mg | ORAL_TABLET | Freq: Four times a day (QID) | ORAL | Status: DC | PRN
Start: 2021-10-29 — End: 2021-11-11
  Administered 2021-10-31 – 2021-11-08 (×8): 650 mg via ORAL
  Filled 2021-10-29 (×8): qty 2

## 2021-10-29 MED ORDER — METFORMIN HCL 500 MG PO TABS
500.0000 mg | ORAL_TABLET | Freq: Every day | ORAL | Status: DC
  Administered 2021-10-30 – 2021-11-11 (×13): 500 mg via ORAL
  Filled 2021-10-29 (×12): qty 1
  Filled 2021-10-29: qty 7
  Filled 2021-10-29 (×2): qty 1

## 2021-10-29 MED ORDER — LAMOTRIGINE 25 MG PO TABS
25.0000 mg | ORAL_TABLET | Freq: Every day | ORAL | Status: DC
  Administered 2021-10-30: 25 mg via ORAL
  Filled 2021-10-29 (×2): qty 1

## 2021-10-29 MED ORDER — BENZTROPINE MESYLATE 1 MG PO TABS
1.0000 mg | ORAL_TABLET | Freq: Two times a day (BID) | ORAL | Status: DC
  Administered 2021-10-30 – 2021-11-04 (×11): 1 mg via ORAL
  Filled 2021-10-29 (×13): qty 1

## 2021-10-29 MED ORDER — GABAPENTIN 400 MG PO CAPS
400.0000 mg | ORAL_CAPSULE | Freq: Three times a day (TID) | ORAL | Status: DC
  Administered 2021-10-30 – 2021-11-02 (×10): 400 mg via ORAL
  Filled 2021-10-29 (×16): qty 1

## 2021-10-29 MED ORDER — QUETIAPINE FUMARATE 50 MG PO TABS
50.0000 mg | ORAL_TABLET | Freq: Every day | ORAL | Status: DC
  Administered 2021-10-29: 50 mg via ORAL
  Filled 2021-10-29: qty 1
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
## 2021-10-29 NOTE — ED Notes (Signed)
Faxed Voluntary Consent over to [facility] ?

## 2021-10-29 NOTE — ED Notes (Signed)
Pt doing TTS assessment at this time.  ?

## 2021-10-29 NOTE — Progress Notes (Signed)
[facility] LCSW Progress Note ?  ?10/29/2021    1:14 PM ? ?Duane Salazar  ? ?[id number]  ? ?Type of Contact and Topic:  Psychiatric Bed Placement  ? ?Pt accepted to [facility] 501-2   ? ?Patient meets inpatient criteria per Darrol Angel, NP ? ?The attending provider will be Viann Fish, MD  ? ?Call report to [phone]   ? ?Hope Bernadene Person, RN @ [facility] ED notified.    ? ?Pt scheduled  to arrive at [facility] at 2100.  ? ? ?Mariea Clonts, MSW, LCSW-A  ?1:15 PM 10/29/2021   ?  ? ?  ?  ? ? ? ? ?  ?
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
## 2021-10-29 NOTE — ED Notes (Signed)
Pt's belongings are in locker #2. ?

## 2021-10-30 ENCOUNTER — Encounter (HOSPITAL_COMMUNITY): Payer: Self-pay

## 2021-10-30 ENCOUNTER — Other Ambulatory Visit: Payer: Self-pay

## 2021-10-30 ENCOUNTER — Encounter (HOSPITAL_COMMUNITY): Payer: Self-pay | Admitting: Behavioral Health

## 2021-10-30 DIAGNOSIS — F259 Schizoaffective disorder, unspecified: Secondary | ICD-10-CM

## 2021-10-30 MED ORDER — CHLORPROMAZINE HCL 25 MG PO TABS
25.0000 mg | ORAL_TABLET | Freq: Two times a day (BID) | ORAL | Status: DC
  Administered 2021-10-30 – 2021-11-02 (×6): 25 mg via ORAL
  Filled 2021-10-30 (×11): qty 1

## 2021-10-30 MED ORDER — ZIPRASIDONE MESYLATE 20 MG IM SOLR: 20.0000 mg | Freq: Four times a day (QID) | INTRAMUSCULAR | Status: DC | PRN

## 2021-10-30 MED ORDER — TRAZODONE HCL 50 MG PO TABS
50.0000 mg | ORAL_TABLET | Freq: Every evening | ORAL | Status: DC | PRN
  Administered 2021-10-30 – 2021-11-03 (×5): 50 mg via ORAL
  Filled 2021-10-30 (×6): qty 1

## 2021-10-30 MED ORDER — LORAZEPAM 1 MG PO TABS: 1.0000 mg | ORAL_TABLET | Freq: Four times a day (QID) | ORAL | Status: DC | PRN

## 2021-10-30 MED ORDER — OLANZAPINE 5 MG PO TBDP
5.0000 mg | ORAL_TABLET | Freq: Four times a day (QID) | ORAL | Status: DC | PRN
  Administered 2021-11-01: 5 mg via ORAL
  Filled 2021-10-30: qty 1

## 2021-10-30 MED ORDER — OLANZAPINE 5 MG PO TBDP: 5.0000 mg | ORAL_TABLET | Freq: Three times a day (TID) | ORAL | Status: DC | PRN

## 2021-10-30 MED ORDER — CHLORPROMAZINE HCL 50 MG PO TABS
50.0000 mg | ORAL_TABLET | Freq: Every day | ORAL | Status: DC
  Administered 2021-10-30 – 2021-11-01 (×3): 50 mg via ORAL
  Filled 2021-10-30 (×5): qty 1

## 2021-10-30 MED ORDER — DIVALPROEX SODIUM 500 MG PO DR TAB
750.0000 mg | DELAYED_RELEASE_TABLET | Freq: Two times a day (BID) | ORAL | Status: DC
  Administered 2021-10-30 – 2021-11-11 (×25): 750 mg via ORAL
  Filled 2021-10-30 (×33): qty 1

## 2021-10-30 MED ORDER — HYDROXYZINE HCL 25 MG PO TABS
25.0000 mg | ORAL_TABLET | Freq: Three times a day (TID) | ORAL | Status: DC | PRN
  Administered 2021-11-02 – 2021-11-10 (×8): 25 mg via ORAL
  Filled 2021-10-30 (×8): qty 1
  Filled 2021-10-30: qty 10
  Filled 2021-10-30: qty 1

## 2021-10-30 NOTE — Group Note (Signed)
LCSW Group Therapy Note ? ?Group Date: 10/30/2021 ?Start Time: 1100 ?End Time: 1200 ? ? ?Type of Therapy and Topic:  Group Therapy - Healthy vs Unhealthy Coping Skills ? ?Participation Level:  Did Not Attend  ? ?Description of Group ?The focus of this group was to determine what unhealthy coping techniques typically are used by group members and what healthy coping techniques would be helpful in coping with various problems. Patients were guided in becoming aware of the differences between healthy and unhealthy coping techniques. Patients were asked to identify 2-3 healthy coping skills they would like to learn to use more effectively. ? ?Therapeutic Goals ?Patients learned that coping is what human beings do all day long to deal with various situations in their lives ?Patients defined and discussed healthy vs unhealthy coping techniques ?Patients identified their preferred coping techniques and identified whether these were healthy or unhealthy ?Patients determined 2-3 healthy coping skills they would like to become more familiar with and use more often. ?Patients provided support and ideas to each other ? ? ?Summary of Patient Progress:  Did not attend ? ? ?Therapeutic Modalities ?Cognitive Behavioral Therapy ?Motivational Interviewing ? ?Vassie Moselle, LCSW ?10/30/2021  11:36 AM   ?

## 2021-10-30 NOTE — Progress Notes (Signed)
Recreation Therapy Notes ? ?INPATIENT RECREATION THERAPY ASSESSMENT ? ?Patient Details ?Name: Duane Salazar ?MRN: [id number] ?DOB: 09-15-1975 ?Today's Date: 10/30/2021 ?      ?Information Obtained From: ?Patient ? ?Able to Participate in Assessment/Interview: ?Yes ? ?Patient Presentation: ?Alert (Annoyed) ? ?Reason for Admission (Per Patient): ?Other (Comments), Suicidal Ideation (per chart: HI, anxiety, depression) ? ?Patient Stressors: ?Other (Comment) ("something I don't want to talk about") ? ?Coping Skills:   ?Isolation, Self-Injury, Music, Deep Breathing, Meditate, Talk, Prayer, Avoidance ? ?Leisure Interests (2+):  ?Joretta Bachelor care, Individual - Other (Comment) ("nothing maybe some yard work") ? ?Frequency of Recreation/Participation: ?Other (Comment) (Did not give response) ? ?Awareness of Community Resources:  ?Yes ? ?Community Resources:  ?Library, [employer], Movie Theaters ? ?Current Use: ? (Did not give response) ? ?If no, Barriers?: ?  ? ?Expressed Interest in [employer] Information: ?No ? ?[place] of Residence:  ?[place] ? ?Patient Main Form of Transportation: ?  ? ?Patient Strengths:  ?"none" ? ?Patient Identified Areas of Improvement:  ?"being more productive" ? ?Patient Goal for Hospitalization:  ?"I don't have one yet" ? ?Current SI (including self-harm):  ?Yes (8 out of 10; Contracts for safety) ? ?Current HI:  ?Yes (8 out of 10; contracts for safety) ? ?Current AVH: ?No ? ?Staff Intervention Plan: ?Group Attendance, Collaborate with Interdisciplinary Treatment Team ? ?Consent to Intern Participation: ?N/A ? ? ?Victorino Sparrow, LRT,CTRS ?Victorino Sparrow A ?10/30/2021, 1:16 PM ?
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
## 2021-10-30 NOTE — H&P (Addendum)
Psychiatric Admission Assessment Adult  Patient Identification: Duane Salazar MRN:  [id number] Date of Evaluation:  10/30/2021 Chief Complaint:  Schizoaffective disorder ([place]) [F25.9] Principal Diagnosis: Schizoaffective disorder ([place]) Diagnosis:  Principal Problem:   Schizoaffective disorder ([place])  History of Present Illness:  Duane Salazar is a 46 yr old male who presented on 3/1 to [facility] for worsening depression and a Suicide Attempt via OD (Meth), he was admitted to [facility] on 3/3.  PPHx is significant for Schizoaffective Disorder, Anxiety, 1 previous Suicide Attempt (jump off bridge), and ~10 previous hospitalizations (latest [facility] 2017).  He reports that he has been having issues for the past 4 to 6 months.  He states that his SI started about 2 months ago.  He states that he has been wanting to overdose and also becoming more aggressive swinging at people.  He states that he has been hospitalized several times before and in the past he would lie slightly quicker and say his symptoms were under control by the medication from his wife.  He states that when he was in prison he could just tell them whatever symptoms he wanted to get whatever diagnosis he wanted but at this time he wants to be accurately diagnosed.  When asked if he was glad that he did not die in his overdose attempt on methamphetamine he states 50/50.  He reports past psychiatric history of schizoaffective disorder and anxiety.  He states that he has been hospitalized approximately 10 times.  He states that he does have 1 previous suicide attempt when he jumped off a bridge.  He reports he has previously been on Zyprexa, Prozac, Seroquel, Thorazine, gabapentin, Haldol, and Depakote.  He states that the Thorazine helped calm him down and the Depakote also did well with controlling his mood.  He states he did not like being on Seroquel because it made him so that he could not defend himself.  He reports no known family history of diagnoses or  suicides.  He reports he smokes 1 pack/day.  When asked if he drinks alcohol he reports 1/5 a week.  He reports using cocaine twice a week and THC frequently.  He states he normally does not do meth just did it in an attempt to overdose and that he had already been planning if this did not work he would overdose on fentanyl in a week.  He reports following symptoms of depression: Depressed mood, anhedonia, fatigue, decreased energy, decreased sleep, decreased appetite, decreased concentration, and anxiety. He reports following manic symptoms: Racing thoughts, irritability, and distractibility. He reports the following symptoms of psychosis: Paranoia and hearing his own voice in his head.  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  He reports he has had 2 brain surgeries for this.  He does report 1 seizure approximately 3 to 4 years ago.  Associated Signs/Symptoms: Depression Symptoms:  depressed mood, anhedonia, insomnia, fatigue, difficulty concentrating, suicidal attempt, anxiety, loss of energy/fatigue, disturbed sleep, decreased appetite, Duration of Depression Symptoms: No data recorded (Hypo) Manic Symptoms:  Distractibility, Flight of Ideas, Irritable Mood, Anxiety Symptoms:  Excessive Worry, Psychotic Symptoms:  Hallucinations: Auditory Paranoia, PTSD Symptoms: NA Total Time spent with patient: 1 hour  Past Psychiatric History: Schizoaffective Disorder, Anxiety, 1 previous Suicide Attempt (jump off bridge), and ~10 previous hospitalizations (latest [facility] 2017).  Is the patient at risk to self? Yes.    Has the patient been a risk to self in the past 6 months? No.  Has the patient been a risk  to self within the distant past? Yes.    Is the patient a risk to others? Yes.    Has the patient been a risk to others in the past 6 months? Yes.    Has the patient been a risk to others within the distant past? Yes.     Prior Inpatient Therapy:  ~10 Hospitalizations  (latest [facility] 2017) Prior Outpatient Therapy:  yes [facility] Dr. Ronne Binning  Alcohol Screening: 1. How often do you have a drink containing alcohol?: 4 or more times a week 2. How many drinks containing alcohol do you have on a typical day when you are drinking?: 1 or 2 3. How often do you have six or more drinks on one occasion?: Weekly AUDIT-C Score: 7 4. How often during the last year have you found that you were not able to stop drinking once you had started?: Never 5. How often during the last year have you failed to do what was normally expected from you because of drinking?: Never 6. How often during the last year have you needed a first drink in the morning to get yourself going after a heavy drinking session?: Never 7. How often during the last year have you had a feeling of guilt of remorse after drinking?: Never 8. How often during the last year have you been unable to remember what happened the night before because you had been drinking?: Never 9. Have you or someone else been injured as a result of your drinking?: No 10. Has a relative or friend or a doctor or another health worker been concerned about your drinking or suggested you cut down?: No Alcohol Use Disorder Identification Test Final Score (AUDIT): 7 Alcohol Brief Interventions/Follow-up: Patient Refused Substance Abuse History in the last 12 months:  Yes.   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 Tobacco Screening:   Social History:  Social History   Substance and Sexual Activity  Alcohol Use Yes   Alcohol/week: 2.0 standard drinks   Types: 2 Cans of beer per week   Comment: 2 cans on workdays; 6-pk on days off; varies     Social History   Substance and Sexual Activity  Drug Use Yes   Types: Cocaine, Marijuana, Heroin   Comment: Reports use 2 grams Cocaine 10/28/21    Additional Social History:  Allergies:  No Known Allergies Lab Results:  Results for orders placed or performed during the hospital encounter of 10/28/21 (from the past 48 hour(s))  CBG monitoring, ED     Status: Abnormal   Collection Time: 10/29/21  8:28 AM  Result Value Ref Range   Glucose-Capillary 163 (H) 70 - 99 mg/dL    Comment: Glucose reference range applies only to samples taken after fasting for at least 8 hours.    Blood Alcohol level:  Lab Results  Component Value Date   ETH <10 10/28/2021   ETH <10 87/56/4332    Metabolic Disorder Labs:  Lab Results  Component Value Date   HGBA1C 6.8 09/04/2021   No results found for: PROLACTIN Lab Results  Component Value Date   CHOL 138 09/04/2021   TRIG 90 09/04/2021   HDL 40 09/04/2021   CHOLHDL 3.5 09/04/2021   VLDL 17 02/11/2020   LDLCALC 81 09/04/2021   LDLCALC 51 02/11/2020    Current Medications: Current Facility-Administered Medications  Medication Dose Route Frequency Provider Last Rate Last Admin   acetaminophen (TYLENOL) tablet 650 mg  650 mg Oral  Q6H PRN White, Patrice L, NP       alum & mag hydroxide-simeth (MAALOX/MYLANTA) 200-200-20 MG/5ML suspension 30 mL  30 mL Oral Q4H PRN White, Patrice L, NP       benztropine (COGENTIN) tablet 1 mg  1 mg Oral BID White, Patrice L, NP   1 mg at 10/30/21 1758   celecoxib (CELEBREX) capsule 200 mg  200 mg Oral Daily White, Patrice L, NP   200 mg at 10/30/21 9518   chlorproMAZINE (THORAZINE) tablet 25 mg  25 mg Oral BID Briant Cedar, MD   25 mg at 10/30/21 1252   chlorproMAZINE (THORAZINE) tablet 50 mg  50 mg Oral QHS Briant Cedar, MD       divalproex (DEPAKOTE) DR tablet 750 mg  750 mg Oral BID Briant Cedar, MD   750 mg at 10/30/21 1253   gabapentin (NEURONTIN) capsule 400 mg  400 mg Oral TID Darrol Angel L, NP   400 mg at 10/30/21 1758   hydrOXYzine (ATARAX) tablet 25 mg  25 mg Oral TID PRN Massengill, Ovid Curd, MD       OLANZapine zydis (ZYPREXA) disintegrating tablet 5 mg  5 mg Oral Q6H PRN Massengill, Ovid Curd, MD       And   LORazepam (ATIVAN) tablet 1 mg  1 mg Oral Q6H PRN Massengill, Ovid Curd, MD       And   ziprasidone (GEODON) injection 20 mg  20 mg Intramuscular Q6H PRN Massengill, Ovid Curd, MD       losartan (COZAAR) tablet 25 mg  25 mg Oral Daily White, Patrice L, NP   25 mg at 10/30/21 8416   magnesium hydroxide (MILK OF MAGNESIA) suspension 30 mL  30 mL Oral Daily PRN White, Patrice L, NP       metFORMIN (GLUCOPHAGE) tablet 500 mg  500 mg Oral Q breakfast White, Patrice L, NP   500 mg at 10/30/21 6063   predniSONE (DELTASONE) tablet 5 mg  5 mg Oral Q breakfast White, Patrice L, NP   5 mg at 10/30/21 0160   traZODone (DESYREL) tablet 50 mg  50 mg Oral QHS PRN Massengill, Ovid Curd, MD       PTA Medications: Medications Prior to Admission  Medication Sig Dispense Refill Last Dose   benztropine (COGENTIN) 1 MG tablet Take 1 tablet (1 mg total) by mouth  2 (two) times daily. (Patient not taking: Reported on 10/28/2021) 60 tablet 3    celecoxib (CELEBREX) 200 MG capsule  Take 1 capsule (200 mg total) by mouth daily. (Patient not taking: Reported on 10/28/2021) 30 capsule 1    ferrous sulfate 325 (65 FE) MG tablet Take 1 tablet by mouth every Monday and Friday (Patient not taking: Reported on 10/28/2021) 100 tablet 1    gabapentin (NEURONTIN) 400 MG capsule Take 1 capsule (400 mg total) by mouth 3 (three) times daily. (Patient not taking: Reported on 10/28/2021) 90 capsule 3    glucose monitoring kit (FREESTYLE) monitoring kit 1 each by Does not apply route daily. Check glucose once in the morning before breakfast and 1 hour after a meal (Patient not taking: Reported on 02/07/2021) 1 each 0    lamoTRIgine (LAMICTAL) 25 MG tablet Take 1 tablet (25 mg total) by mouth daily. (Patient not taking: Reported on 10/28/2021) 120 tablet 2    losartan (COZAAR) 25 MG tablet Take 1 tablet (25 mg total) by mouth daily. (Patient not taking: Reported on 10/28/2021) 30 tablet 3    metFORMIN (GLUCOPHAGE) 500 MG tablet Take 1 tablet (500 mg total) by mouth daily with breakfast. (Patient not taking: Reported on 10/28/2021) 30 tablet 3    predniSONE (DELTASONE) 5 MG tablet Take 1 tablet (5 mg total) by mouth daily with breakfast. (Patient not taking: Reported on 10/28/2021) 30 tablet 0    QUEtiapine (SEROQUEL) 50 MG tablet Take 2 tablets (100 mg total) by mouth at bedtime. (Patient not taking: Reported on 10/28/2021) 30 tablet 3     Musculoskeletal: Strength & Muscle Tone: within normal limits Gait & Station: normal Patient leans: N/A            Psychiatric Specialty Exam:  Presentation  General Appearance: Appropriate for Environment; Casual  Eye Contact:Fair  Speech:Clear and Coherent; Normal Rate  Speech Volume:Normal  Handedness:Left   Mood and Affect  Mood:Depressed  Affect:Congruent; Depressed   Thought Process  Thought Processes:Coherent; Goal Directed  Duration of Psychotic Symptoms: Less than six months  Past Diagnosis of Schizophrenia or Psychoactive disorder:  Yes  Descriptions of Associations:Intact  Orientation:Full (Time, Place and Person)  Thought Content:Logical Had a suicide attempt prior to admission, No HI, No VH. Reports hearing his own voice when asked AH. Reports paranoia.  Reports no Ideas of Reference or First Rank Symptoms. Hallucinations:Hallucinations: Auditory Description of Auditory Hallucinations: reports hearing his voice, in the past heard his families voice  Ideas of Reference:Paranoia  Suicidal Thoughts:Suicidal Thoughts: -- (Suicide Attempt prior to admission)  Homicidal Thoughts:Homicidal Thoughts: No   Sensorium  Memory:Immediate Fair; Recent Fair  Judgment:Intact  Insight:Present   Executive Functions  Concentration:Fair  Attention Span:Fair  [place]   Psychomotor Activity  Psychomotor Activity:Psychomotor Activity: Normal   Assets  Assets:Communication Skills; Desire for Improvement; Resilience   Sleep  Sleep:Sleep: Poor    Physical Exam: Physical Exam Vitals and nursing note reviewed.  Constitutional:      General: He is not in acute distress.    Appearance: He is obese. He is not ill-appearing or toxic-appearing.     Comments: Acromegaly- large hands and jaw  HENT:     Head: Normocephalic and atraumatic.  Pulmonary:     Effort: Pulmonary effort is normal.  Neurological:     Mental Status: He is alert.   Review of Systems  Respiratory:  Negative for cough and shortness of breath.   Cardiovascular:  Negative for chest pain.  Gastrointestinal:  Positive for nausea. Negative for abdominal pain, constipation, diarrhea and vomiting.  Neurological:  Positive for weakness. Negative for dizziness and headaches.  Psychiatric/Behavioral:  Positive for depression, hallucinations, substance abuse and suicidal ideas. The patient is nervous/anxious.   Blood pressure (!) 137/97, pulse 97, temperature 98 F (36.7 C), temperature source Oral,  resp. rate 18, height 6' 3.5" (1.918 m), weight 110.7 kg, SpO2 100 %. Body mass index is 30.1 kg/m.  Treatment Plan Summary: Daily contact with patient to assess and evaluate symptoms and progress in treatment and Medication management  Duane Salazar is a 46 yr old male who presented on 3/1 to [facility] for worsening depression and a Suicide Attempt via OD (Meth), he was admitted to [facility] on 3/3.  PPHx is significant for Schizoaffective Disorder, Anxiety, 1 previous Suicide Attempt (jump off bridge), and ~10 previous hospitalizations (latest [facility] 2017).  Since his Seroquel and Lamictal are not controlling his symptoms we will stop these.  As he has done well on Thorazine in the past we will start this.  He has done well on Depakote so will also start this.  As his Na was low and his AST were elevated we will redraw a CMP tomorrow to monitor.  We will also obtain a TSH/Lipid Panel/A1c.  We will continue to monitor.   Schizoaffective Disorder: -Start Thorazine 25 mg AM, 25 mg 25 mg noon, 50 mg QHS for Psychosis and Mood Stability -Start Depakote DR 750 mg BID for Mood Stability -Start Agitation Protocol: Zyprexa/Ativan/Geodon -Continue Gabapentin 400 mg TID for Mood Stability -Continue Cogentin 1 mg BID for drug induced extrapyramidal side effects -Stop Lamictal -Stop Seroquel   HTN: -Continue Losartan 25 mg for HTN   Diabetes: -Continue Metformin 500 mg daily   Acromegaly: -Continue Prednisone 5 mg daily -Continue Celebrex 200 mg daily for joint pain   Observation Level/Precautions:  15 minute checks  Laboratory:  CMP: WNL except Na:132 and AST: 46,  CBC: WNL except  RBC: 6.01,  MCV: 76.0,  MCH: 23.5.  UDS: Cocaine, Amphet, THC positive,  EtOH: WNL  Resp panel: Neg  CT Head: No acute intracranial abnormalities  EKG: NSR with Qtc: 452 TSH/Lipid Panel/A1c ordered  Psychotherapy:    Medications:  Thorazine, Depakote, Gabapentin  Consultations:    Discharge Concerns:    Estimated LOS: 5-7  days  Other:     Physician Treatment Plan for Primary Diagnosis: Schizoaffective disorder ([place]) Long Term Goal(s): Improvement in symptoms so as ready for discharge  Short Term Goals: Ability to identify changes in lifestyle to reduce recurrence of condition will improve, Ability to verbalize feelings will improve, Ability to disclose and discuss suicidal ideas, Ability to demonstrate self-control will improve, Ability to identify and develop effective coping behaviors will improve, and Ability to identify triggers associated with substance abuse/mental health issues will improve  Physician Treatment Plan for Secondary Diagnosis: Principal Problem:   Schizoaffective disorder ([place])  Long Term Goal(s): Improvement in symptoms so as ready for discharge  Short Term Goals: Ability to identify changes in lifestyle to reduce recurrence of condition will improve, Ability to verbalize feelings will improve, Ability to disclose and discuss suicidal ideas, Ability to demonstrate self-control will improve, Ability to identify and develop effective coping behaviors will improve, and Ability to identify triggers associated with substance abuse/mental health issues will improve  I certify that inpatient services furnished can reasonably be expected to improve the patient's condition.    Briant Cedar, MD  3/3/20236:26 PM

## 2021-10-30 NOTE — Plan of Care (Signed)
?  Problem: Education: Goal: Emotional status will improve Outcome: Not Progressing Goal: Mental status will improve Outcome: Not Progressing   Problem: Coping: Goal: Ability to verbalize frustrations and anger appropriately will improve Outcome: Not Progressing   

## 2021-10-30 NOTE — Progress Notes (Signed)
Patient did not attend morning orientation group.  

## 2021-10-30 NOTE — Group Note (Signed)
Recreation Therapy Group Note ? ? ?Group Topic:Problem Solving  ?Group Date: 10/30/2021 ?Start Time: 1010 ?End Time: 1055 ?Facilitators: Victorino Sparrow, LRT,CTRS ?Location: [place] ? ? ?Goal Area(s) Addresses:  ?Patient will effectively work in a team with other group members. ?Patient will verbalize importance of using appropriate problem solving techniques.  ?Patient will identify positive change associated with effective problem solving skills.  ? ?Group Description:  Brain Teasers.  Patients were given two sheets of brain teasers to fill out.  Patients started off working individually but eventually worked together to come up with the answers for the harder ones.  Patients were to concentrate and focus in order to come up with the correct answers. ? ? ?Affect/Mood: N/A ?  ?Participation Level: Did not attend ?  ? ?Clinical Observations/Individualized Feedback:  ? ? ?Plan: Continue to engage patient in RT group sessions 2-3x/week. ? ? ?Victorino Sparrow, LRT,CTRS ?10/30/2021 12:54 PM ?

## 2021-10-30 NOTE — Tx Team (Signed)
Initial Treatment Plan ?10/30/2021 ?4:41 AM ?Duane Salazar ?ZSM:[id number] ? ? ? ?PATIENT STRESSORS: ?Financial difficulties   ?Health problems   ?Marital or family conflict   ?Substance abuse   ?Other: "Certain people in the society"   ? ? ?PATIENT STRENGTHS: ?Capable of independent living  ?General fund of knowledge  ? ? ?PATIENT IDENTIFIED PROBLEMS: ?Suicidal Ideation w/ plan  ?Homicidal Ideation  ?Substance Abuse  ?Anxiety  ?Depression  ?  ?"Help with processing my thoughts"  ?"Help learning how to restrain myself"  ?"Help getting over my paranoia"  ?  ? ?DISCHARGE CRITERIA:  ?Ability to meet basic life and health needs ?Adequate post-discharge living arrangements ?Improved stabilization in mood, thinking, and/or behavior ? ?PRELIMINARY DISCHARGE PLAN: ?Outpatient therapy ?Placement in alternative living arrangements ? ?PATIENT/FAMILY INVOLVEMENT: ?This treatment plan has been presented to and reviewed with the patient, Duane Salazar.  The patient and family have been given the opportunity to ask questions and make suggestions. ? ?Benuel Ly, Carolyne Fiscal, RN ?10/30/2021, 4:41 AM ?

## 2021-10-30 NOTE — Progress Notes (Signed)
?   10/29/21 2230  ?Psych Admission Type (Psych Patients Only)  ?Admission Status Voluntary  ?Psychosocial Assessment  ?Patient Complaints Agitation;Anxiety;Depression;Appetite decrease;Irritability;Worrying;Other (Comment) ?(Paranoid)  ?Eye Contact Fair  ?Facial Expression Flat  ?Affect Depressed  ?Speech Logical/coherent  ?Interaction Assertive  ?Motor Activity Slow  ?Appearance/Hygiene In scrubs;Poor hygiene  ?Behavior Characteristics Anxious  ?Mood Depressed;Anxious;Suspicious  ?Aggressive Behavior  ?Targets Self;Other (Comment)  ?Type of Behavior Striking out  ?Effect No apparent injury  ?Thought Process  ?Coherency WDL  ?Content Paranoia  ?Delusions None reported or observed  ?Perception Hallucinations  ?Hallucination Auditory;Visual  ?Judgment Poor  ? ? ?

## 2021-10-30 NOTE — Progress Notes (Signed)
? ? ?   10/29/21 2230  ?Psych Admission Type (Psych Patients Only)  ?Admission Status Voluntary  ?Psychosocial Assessment  ?Patient Complaints Agitation;Anxiety;Depression;Appetite decrease;Irritability;Worrying;Other (Comment) ?(Paranoid)  ?Eye Contact Fair  ?Facial Expression Flat  ?Affect Depressed  ?Speech Logical/coherent  ?Interaction Assertive  ?Motor Activity Slow  ?Appearance/Hygiene In scrubs;Poor hygiene  ?Behavior Characteristics Anxious  ?Mood Depressed;Anxious;Suspicious  ?Aggressive Behavior  ?Targets Self;Other (Comment)  ?Type of Behavior Striking out  ?Effect No apparent injury  ?Thought Process  ?Coherency WDL  ?Content Paranoia  ?Delusions None reported or observed  ?Perception Hallucinations  ?Hallucination Auditory;Visual  ?Judgment Poor  ? ? ?

## 2021-10-30 NOTE — [facility] Suicide Risk Assessment (Signed)
[facility] INPATIENT:  Family/Significant Other Suicide Prevention Education ? ?Suicide Prevention Education:  ?Patient Refusal for Family/Significant Other Suicide Prevention Education: The patient Duane Salazar has refused to provide written consent for family/significant other to be provided Family/Significant Other Suicide Prevention Education during admission and/or prior to discharge.  Physician notified. ? ?[place] ?10/30/2021, 1:58 PM ?

## 2021-10-30 NOTE — BH IP Treatment Plan (Signed)
Interdisciplinary Treatment and Diagnostic Plan Update ? ?10/30/2021 ?Duane Salazar ?MRN: [id number] ? ?Principal Diagnosis: <principal problem not specified> ? ?Secondary Diagnoses: Active Problems: ?  Schizoaffective disorder ([place]) ? ? ?Current Medications:  ?Current Facility-Administered Medications  ?Medication Dose Route Frequency Provider Last Rate Last Admin  ? acetaminophen (TYLENOL) tablet 650 mg  650 mg Oral Q6H PRN White, Patrice L, NP      ? alum & mag hydroxide-simeth (MAALOX/MYLANTA) 200-200-20 MG/5ML suspension 30 mL  30 mL Oral Q4H PRN White, Patrice L, NP      ? benztropine (COGENTIN) tablet 1 mg  1 mg Oral BID White, Patrice L, NP   1 mg at 10/30/21 0925  ? celecoxib (CELEBREX) capsule 200 mg  200 mg Oral Daily White, Patrice L, NP   200 mg at 10/30/21 9628  ? chlorproMAZINE (THORAZINE) tablet 25 mg  25 mg Oral BID Briant Cedar, MD   25 mg at 10/30/21 1252  ? chlorproMAZINE (THORAZINE) tablet 50 mg  50 mg Oral QHS Briant Cedar, MD      ? divalproex (DEPAKOTE) DR tablet 750 mg  750 mg Oral BID Briant Cedar, MD   750 mg at 10/30/21 1253  ? gabapentin (NEURONTIN) capsule 400 mg  400 mg Oral TID Darrol Angel L, NP   400 mg at 10/30/21 1252  ? hydrOXYzine (ATARAX) tablet 25 mg  25 mg Oral TID PRN Janine Limbo, MD      ? OLANZapine zydis (ZYPREXA) disintegrating tablet 5 mg  5 mg Oral Q6H PRN Massengill, Ovid Curd, MD      ? And  ? LORazepam (ATIVAN) tablet 1 mg  1 mg Oral Q6H PRN Massengill, Ovid Curd, MD      ? And  ? ziprasidone (GEODON) injection 20 mg  20 mg Intramuscular Q6H PRN Massengill, Ovid Curd, MD      ? losartan (COZAAR) tablet 25 mg  25 mg Oral Daily White, Patrice L, NP   25 mg at 10/30/21 3662  ? magnesium hydroxide (MILK OF MAGNESIA) suspension 30 mL  30 mL Oral Daily PRN White, Patrice L, NP      ? metFORMIN (GLUCOPHAGE) tablet 500 mg  500 mg Oral Q breakfast White, Patrice L, NP   500 mg at 10/30/21 9476  ? predniSONE (DELTASONE) tablet 5 mg  5 mg Oral Q  breakfast White, Patrice L, NP   5 mg at 10/30/21 5465  ? traZODone (DESYREL) tablet 50 mg  50 mg Oral QHS PRN Janine Limbo, MD      ? ?PTA Medications: ?Medications Prior to Admission  ?Medication Sig Dispense Refill Last Dose  ? benztropine (COGENTIN) 1 MG tablet Take 1 tablet (1 mg total) by mouth 2 (two) times daily. (Patient not taking: Reported on 10/28/2021) 60 tablet 3   ? celecoxib (CELEBREX) 200 MG capsule Take 1 capsule (200 mg total) by mouth daily. (Patient not taking: Reported on 10/28/2021) 30 capsule 1   ? ferrous sulfate 325 (65 FE) MG tablet Take 1 tablet by mouth every Monday and Friday (Patient not taking: Reported on 10/28/2021) 100 tablet 1   ? gabapentin (NEURONTIN) 400 MG capsule Take 1 capsule (400 mg total) by mouth 3 (three) times daily. (Patient not taking: Reported on 10/28/2021) 90 capsule 3   ? glucose monitoring kit (FREESTYLE) monitoring kit 1 each by Does not apply route daily. Check glucose once in the morning before breakfast and 1 hour after a meal (Patient not taking: Reported on 02/07/2021) 1 each 0   ?  lamoTRIgine (LAMICTAL) 25 MG tablet Take 1 tablet (25 mg total) by mouth daily. (Patient not taking: Reported on 10/28/2021) 120 tablet 2   ? losartan (COZAAR) 25 MG tablet Take 1 tablet (25 mg total) by mouth daily. (Patient not taking: Reported on 10/28/2021) 30 tablet 3   ? metFORMIN (GLUCOPHAGE) 500 MG tablet Take 1 tablet (500 mg total) by mouth daily with breakfast. (Patient not taking: Reported on 10/28/2021) 30 tablet 3   ? predniSONE (DELTASONE) 5 MG tablet Take 1 tablet (5 mg total) by mouth daily with breakfast. (Patient not taking: Reported on 10/28/2021) 30 tablet 0   ? QUEtiapine (SEROQUEL) 50 MG tablet Take 2 tablets (100 mg total) by mouth at bedtime. (Patient not taking: Reported on 10/28/2021) 30 tablet 3   ? ? ?Patient Stressors: Financial difficulties   ?Health problems   ?Marital or family conflict   ?Substance abuse   ?Other: "Certain people in the society"   ? ?Patient  Strengths: Capable of independent living  ?General fund of knowledge  ? ?Treatment Modalities: Medication Management, Group therapy, Case management,  ?1 to 1 session with clinician, Psychoeducation, Recreational therapy. ? ? ?Physician Treatment Plan for Primary Diagnosis: <principal problem not specified> ?Long Term Goal(s):    ? ?Short Term Goals:   ? ?Medication Management: Evaluate patient's response, side effects, and tolerance of medication regimen. ? ?Therapeutic Interventions: 1 to 1 sessions, Unit Group sessions and Medication administration. ? ?Evaluation of Outcomes: Progressing ? ?Physician Treatment Plan for Secondary Diagnosis: Active Problems: ?  Schizoaffective disorder ([place]) ? ?Long Term Goal(s):    ? ?Short Term Goals:      ? ?Medication Management: Evaluate patient's response, side effects, and tolerance of medication regimen. ? ?Therapeutic Interventions: 1 to 1 sessions, Unit Group sessions and Medication administration. ? ?Evaluation of Outcomes: Progressing ? ? ?RN Treatment Plan for Primary Diagnosis: <principal problem not specified> ?Long Term Goal(s): Knowledge of disease and therapeutic regimen to maintain health will improve ? ?Short Term Goals: Ability to participate in decision making will improve, Ability to verbalize feelings will improve, and Ability to identify and develop effective coping behaviors will improve ? ?Medication Management: RN will administer medications as ordered by provider, will assess and evaluate patient's response and provide education to patient for prescribed medication. RN will report any adverse and/or side effects to prescribing provider. ? ?Therapeutic Interventions: 1 on 1 counseling sessions, Psychoeducation, Medication administration, Evaluate responses to treatment, Monitor vital signs and CBGs as ordered, Perform/monitor CIWA, COWS, AIMS and Fall Risk screenings as ordered, Perform wound care treatments as ordered. ? ?Evaluation of Outcomes:  Progressing ? ? ?LCSW Treatment Plan for Primary Diagnosis: <principal problem not specified> ?Long Term Goal(s): Safe transition to appropriate next level of care at discharge, Engage patient in therapeutic group addressing interpersonal concerns. ? ?Short Term Goals: Engage patient in aftercare planning with referrals and resources, Increase social support, and Increase ability to appropriately verbalize feelings ? ?Therapeutic Interventions: Assess for all discharge needs, 1 to 1 time with Education officer, museum, Explore available resources and support systems, Assess for adequacy in community support network, Educate family and significant other(s) on suicide prevention, Complete Psychosocial Assessment, Interpersonal group therapy. ? ?Evaluation of Outcomes: Progressing ? ? ?Progress in Treatment: ?Attending groups: Yes. ?Participating in groups: Yes. ?Taking medication as prescribed: Yes. ?Toleration medication: Yes. ?Family/Significant other contact made: No, will contact:  CSW will obtain consent ?Patient understands diagnosis: Yes. ?Discussing patient identified problems/goals with staff: Yes. ?Medical problems stabilized or resolved: Yes. ?  Denies suicidal/homicidal ideation: Yes. ?Issues/concerns per patient self-inventory: No. ?Other: None ? ?New problem(s) identified: No, Describe:  None ? ?New Short Term/Long Term Goal(s):medication stabilization, elimination of SI thoughts, development of comprehensive mental wellness plan.  ? ?Patient Goals:  "stop racing thoughts" ? ?Discharge Plan or Barriers: Patient recently admitted. CSW will continue to follow and assess for appropriate referrals and possible discharge planning.  ? ?Reason for Continuation of Hospitalization: Hallucinations ?Medication stabilization ?Suicidal ideation ? ?Estimated Length of Stay: 3-5 days ? ? ?Scribe for Treatment Team: ?Eliott Nine ?10/30/2021 ?1:03 PM ?

## 2021-10-30 NOTE — BHH Counselor (Signed)
Adult Comprehensive Assessment ?  ?Patient ID: Duane Salazar, male   DOB: 09-17-1975, 46 y.o.   MRN: [id number] ?  ?Information Source: ?Information source: Patient ?  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  ?Family Relationships: Denies stressor ?Financial / Lack of resources (include bankruptcy): No income.   ?Housing / Lack of housing: Currently homeless. Wants to find his own place ?Physical health (include injuries & life threatening diseases): Aches and Pain. Has a degenerative joint disease.  ?Social relationships: Denies ?Substance abuse: Denies stressor ?Bereavement / Loss: Denies ?  ?Living/Environment/Situation:   ?Living Arrangements: Alone ?Living conditions (as described by patient or guardian):  Has been homeless and going from friend to friend for shelter ?Who else lives in the home?: Self, friends ?How long has patient lived in current situation?: 2 weeks ?What is atmosphere in current home: Temporary ?  ?Family History:   ?Marital status: Separated ?Separated, when?: Feb. 2016, then again 3 days ago after a brief reunion ?What types of issues is patient dealing with in the relationship?: "She was seeing other guys while I was in prison, and now she was texting guys in front of me."   ?Does patient have children?: Yes ?How many children?: 2 ?How is patient's relationship with their children?: 2 sons, 59 and 98, close relationship.  ?  ?Childhood History:   ?By whom was/is the patient raised?: Mother ?Additional childhood history information: Father was not involved at all.   ?Description of patient's relationship with caregiver when they were a child: Close with mother   ?Patient's description of current relationship with people who raised him/her: Close with mother   ?Does patient have siblings?: No ?Did patient suffer any verbal/emotional/physical/sexual abuse as  a child?: No ?Did patient suffer from severe childhood neglect?: No ?Has patient ever been sexually abused/assaulted/raped as an adolescent or adult?: No ?Was the patient ever a victim of a crime or a disaster?: No ?Witnessed domestic violence?: No ?Has patient been effected by domestic violence as an adult?: No ?  ?Education:   ?Highest grade of school patient has completed: GED ?Currently a student?: No ?Name of school: n/a ?Contact person: n/a ?Learning disability?: No ?  ?Employment/Work Situation:    ?Employment situation: Unemployed. 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  ?Financial Resources:    ?Financial resources: Odd jobs ?Does patient have a representative payee or guardian?: No ?  ?Alcohol/Substance Abuse:    ?What has been your use of drugs/alcohol within the last 12 months?: Pt reports using marijuana 3 times a week, cocaine 1-2 times a week,  and alcohol whenever he is not working- around 2 beers a day.    ?If attempted suicide, did drugs/alcohol play a role in this?: No ?Alcohol/Substance Abuse Treatment Hx: Past Tx, Outpatient, Past Tx, Inpatient ([facility] in 12/2019) ?Has alcohol/substance abuse ever caused legal problems?: Yes ?  ?Social Support System:    ?Patient's Community Support System: Poor ?Describe Community Support System: Mother ?Type of faith/religion: Darrick Meigs  ?How does patient's faith help to cope with current illness?: "Not really into it right now" ?  ?Leisure/Recreation:    ?Leisure and  Hobbies:  "Fishing" ?  ?Strengths/Needs:    ?What things does the patient do well?: "Aware of what's going on with me" ?Patient states they can use these personal strengths during their treatment to contribute to their recovery: Yes ?Patient states these barriers may  affect/interfere with their treatment: Denies ?Patient states these barriers may affect their return to the community: Denies ?Other important information patient would like considered in planning for their treatment: None ?  ?Discharge Plan:    ?Currently receiving community mental health services: No ?Patient states concerns and preferences for aftercare planning are: Patient wants help with locating longer term treatment for his mental health and substance use.  ?Patient states they will know when they are safe and ready for discharge when: Yes, when less suicidal  ?Does patient have access to transportation?: No, CSW will continue to assess ?Does patient have financial barriers related to discharge medications?: Yes ?Patient description of barriers related to discharge medications: no income ?Will patient be returning to same living situation after discharge?: No, Pt is unsure where he will be discharging to at this time ?  ?  ?Summary/Recommendations:    ?Duane Salazar was admitted due to suicidal ideation. Pt has a hx of schizoaffective disorder. Recent stressors include lack of support and resources, no income, lack of housing, joint stiffness and pain, and substance use. Pt currently sees no outpatient providers. While here, Duane Salazar can benefit from crisis stabilization, medication management, therapeutic milieu, and referrals for services.  ? ?

## 2021-10-30 NOTE — BHH Suicide Risk Assessment (Addendum)
Suicide Risk Assessment  Admission Assessment    [facility] Admission Suicide Risk Assessment   Nursing information obtained from:  Patient Demographic factors:  Male, Unemployed Current Mental Status:  Suicidal ideation indicated by patient, Suicide plan, Intention to act on suicide plan, Thoughts of violence towards others Loss Factors:  Financial problems / change in socioeconomic status, Decline in physical health Historical Factors:  Impulsivity Risk Reduction Factors:  Positive social support  Total Time spent with patient: 1 hour Principal Problem: Schizoaffective disorder ([place]) Diagnosis:  Principal Problem:   Schizoaffective disorder ([place])  Subjective Data:  Duane Salazar is a 46 yr old male who presented on 3/1 to [facility] for worsening depression and a Suicide Attempt via OD (Meth), he was admitted to [facility] on 3/3.  PPHx is significant for Schizoaffective Disorder, Anxiety, 1 previous Suicide Attempt (jump off bridge), and ~10 previous hospitalizations (latest [facility] 2017).   He reports that he has been having issues for the past 4 to 6 months.  He states that his SI started about 2 months ago.  He states that he has been wanting to overdose and also becoming more aggressive swinging at people.  He states that he has been hospitalized several times before and in the past he would lie slightly quicker and say his symptoms were under control by the medication from his wife.  He states that when he was in prison he could just tell them whatever symptoms he wanted to get whatever diagnosis he wanted but at this time he wants to be accurately diagnosed.  When asked if he was glad that he did not die in his overdose attempt on methamphetamine he states 50/50.  He reports past psychiatric history of schizoaffective disorder and anxiety.  He states that he has been hospitalized approximately 10 times.  He states that he does have 1 previous suicide attempt when he jumped off a bridge.  He reports he has  previously been on Zyprexa, Prozac, Seroquel, Thorazine, gabapentin, Haldol, and Depakote.  He states that the Thorazine helped calm him down and the Depakote also did well with controlling his mood.  He states he did not like being on Seroquel because it made him so that he could not defend himself.  He reports no known family history of diagnoses or suicides.  He reports he smokes 1 pack/day.  When asked if he drinks alcohol he reports 1/5 a week.  He reports using cocaine twice a week and THC frequently.  He states he normally does not do meth just did it in an attempt to overdose and that he had already been planning if this did not work he would overdose on fentanyl in a week.  He reports following symptoms of depression: Depressed mood, anhedonia, fatigue, decreased energy, decreased sleep, decreased appetite, decreased concentration, and anxiety. He reports following manic symptoms: Racing thoughts, irritability, and distractibility. He reports the following symptoms of psychosis: Paranoia and hearing his own voice in his head.   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  He reports he has had 2 brain surgeries for this.  He does report 1 seizure approximately 3 to 4 years ago.  Continued Clinical Symptoms:  Alcohol Use Disorder Identification Test Final Score (AUDIT): 7 The "Alcohol Use Disorders Identification Test", Guidelines for Use in Primary Care, Second Edition.  World [facility]). Score between 0-7:  no or low risk or alcohol related problems. Score between 8-15:  moderate risk of alcohol related problems. Score between  16-19:  high risk of alcohol related problems. Score 20 or above:  warrants further diagnostic evaluation for alcohol dependence and treatment.   CLINICAL FACTORS:   Alcohol/Substance Abuse/Dependencies Schizophrenia:   Paranoid or undifferentiated type More than one psychiatric diagnosis Unstable or Poor Therapeutic  Relationship Previous Psychiatric Diagnoses and Treatments Medical Diagnoses and Treatments/Surgeries   Musculoskeletal: Strength & Muscle Tone: within normal limits Gait & Station: normal Patient leans: N/A  Psychiatric Specialty Exam:  Presentation  General Appearance: Appropriate for Environment; Casual  Eye Contact:Fair  Speech:Clear and Coherent; Normal Rate  Speech Volume:Normal  Handedness:Left   Mood and Affect  Mood:Depressed  Affect:Congruent; Depressed   Thought Process  Thought Processes:Coherent; Goal Directed  Descriptions of Associations:Intact  Orientation:Full (Time, Place and Person)  Thought Content:Logical  History of Schizophrenia/Schizoaffective disorder:Yes  Duration of Psychotic Symptoms:Less than six months  Hallucinations:Hallucinations: Auditory Description of Auditory Hallucinations: reports hearing his voice, in the past heard his families voice  Ideas of Reference:Paranoia  Suicidal Thoughts:Suicidal Thoughts: -- (Suicide Attempt prior to admission)  Homicidal Thoughts:Homicidal Thoughts: No   Sensorium  Memory:Immediate Fair; Recent Fair  Judgment:Intact  Insight:Present   Executive Functions  Concentration:Fair  Attention Span:Fair  [place]   Psychomotor Activity  Psychomotor Activity:Psychomotor Activity: Normal   Assets  Assets:Communication Skills; Desire for Improvement; Resilience   Sleep  Sleep:Sleep: Poor    Physical Exam: Physical Exam Vitals and nursing note reviewed.  Constitutional:      General: He is not in acute distress.    Appearance: He is obese. He is not ill-appearing or toxic-appearing.     Comments: Acromegaly- enlarged hands and jaw  HENT:     Head: Normocephalic and atraumatic.  Pulmonary:     Effort: Pulmonary effort is normal.  Musculoskeletal:        General: Normal range of motion.  Neurological:     General: No focal  deficit present.     Mental Status: He is alert.   Review of Systems  Respiratory:  Negative for cough and shortness of breath.   Cardiovascular:  Negative for chest pain.  Gastrointestinal:  Positive for nausea. Negative for abdominal pain, constipation, diarrhea and vomiting.  Neurological:  Positive for weakness. Negative for dizziness and headaches.  Psychiatric/Behavioral:  Positive for depression, hallucinations, substance abuse and suicidal ideas. The patient is nervous/anxious.   Blood pressure (!) 137/97, pulse 97, temperature 98 F (36.7 C), temperature source Oral, resp. rate 18, height 6' 3.5" (1.918 m), weight 110.7 kg, SpO2 100 %. Body mass index is 30.1 kg/m.   COGNITIVE FEATURES THAT CONTRIBUTE TO RISK:  Polarized thinking and Thought constriction (tunnel vision)    SUICIDE RISK:   Severe:  Frequent, intense, and enduring suicidal ideation, specific plan, no subjective intent, but some objective markers of intent (i.e., choice of lethal method), the method is accessible, some limited preparatory behavior, evidence of impaired self-control, severe dysphoria/symptomatology, multiple risk factors present, and few if any protective factors, particularly a lack of social support.  PLAN OF CARE:  Duane Salazar is a 46 yr old male who presented on 3/1 to [facility] for worsening depression and a Suicide Attempt via OD (Meth), he was admitted to [facility] on 3/3.  PPHx is significant for Schizoaffective Disorder, Anxiety, 1 previous Suicide Attempt (jump off bridge), and ~10 previous hospitalizations (latest [facility] 2017).   Since his Seroquel and Lamictal are not controlling his symptoms we will stop these.  As he has done well on  Thorazine in the past we will start this.  He has done well on Depakote so will also start this.  As his Na was low and his AST were elevated we will redraw a CMP tomorrow to monitor.  We will also obtain a TSH/Lipid Panel/A1c.  We will continue to monitor.      Schizoaffective Disorder: -Start Thorazine 25 mg AM, 25 mg 25 mg noon, 50 mg QHS for Psychosis and Mood Stability -Start Depakote DR 750 mg BID for Mood Stability -Start Agitation Protocol: Zyprexa/Ativan/Geodon -Continue Gabapentin 400 mg TID for Mood Stability -Continue Cogentin 1 mg BID for drug induced extrapyramidal side effects -Stop Lamictal -Stop Seroquel     HTN: -Continue Losartan 25 mg for HTN     Diabetes: -Continue Metformin 500 mg daily     Acromegaly: -Continue Prednisone 5 mg daily -Continue Celebrex 200 mg daily for joint pain  I certify that inpatient services furnished can reasonably be expected to improve the patient's condition.   Briant Cedar, MD 10/30/2021, 6:39 PM  Total Time Spent in Direct Patient Care:  I personally spent 60 minutes on the unit in direct patient care. The direct patient care time included face-to-face time with the patient, reviewing the patient's chart, communicating with other professionals, and coordinating care. Greater than 50% of this time was spent in counseling or coordinating care with the patient regarding goals of hospitalization, psycho-education, and discharge planning needs.  I have independently evaluated the patient during a face-to-face assessment on 10/30/21. I reviewed the patient's chart, and I participated in key portions of the service. I discussed the case with the [employer], and I agree with the assessment and plan of care as documented in the [employer] note, as addended by me or notated below:  I agree with the [place].   Janine Limbo, MD Psychiatrist

## 2021-10-30 NOTE — Progress Notes (Signed)
Adult Psychoeducational Group Note ? ?Date:  10/30/2021 ?Time:  8:38 PM ? ?Group Topic/Focus:  ?Wrap-Up Group:   The focus of this group is to help patients review their daily goal of treatment and discuss progress on daily workbooks. ? ?Participation Level:  Did Not Attend ? ?Participation Quality:   Did Not Attend ? ?Affect:   Did Not Attend ? ?Cognitive:   Did Not Attend ? ?Insight: None ? ?Engagement in Group:   Did Not Attend ? ?Modes of Intervention:   Did Not Attend ? ?Additional Comments:  Pt was encouraged to attend wrap up group but did not attend. ? ?Candy Sledge ?10/30/2021, 8:38 PM ?

## 2021-10-31 LAB — COMPREHENSIVE METABOLIC PANEL
ALT: 24 U/L (ref 0–44)
AST: 22 U/L (ref 15–41)
Albumin: 3.3 g/dL — ABNORMAL LOW (ref 3.5–5.0)
Alkaline Phosphatase: 48 U/L (ref 38–126)
Anion gap: 7 (ref 5–15)
BUN: 11 mg/dL (ref 6–20)
CO2: 23 mmol/L (ref 22–32)
Calcium: 8.6 mg/dL — ABNORMAL LOW (ref 8.9–10.3)
Chloride: 102 mmol/L (ref 98–111)
Creatinine, Ser: 0.58 mg/dL — ABNORMAL LOW (ref 0.61–1.24)
GFR, Estimated: >60 mL/min (ref >60)
Glucose, Bld: 164 mg/dL — ABNORMAL HIGH (ref 70–99)
Potassium: 3.8 mmol/L (ref 3.5–5.1)
Sodium: 132 mmol/L — ABNORMAL LOW (ref 135–145)
Total Bilirubin: 0.2 mg/dL — ABNORMAL LOW (ref 0.3–1.2)
Total Protein: 6.5 g/dL (ref 6.5–8.1)

## 2021-10-31 LAB — HEMOGLOBIN A1C
Hgb A1c MFr Bld: 6.5 % — ABNORMAL HIGH (ref 4.8–5.6)
Mean Plasma Glucose: 139.85 mg/dL

## 2021-10-31 LAB — LIPID PANEL
Cholesterol: 128 mg/dL (ref 0–200)
HDL: 42 mg/dL (ref >40)
LDL Cholesterol: 68 mg/dL (ref 0–99)
Total CHOL/HDL Ratio: 3 RATIO
Triglycerides: 90 mg/dL (ref <150)
VLDL: 18 mg/dL (ref 0–40)

## 2021-10-31 LAB — TSH: TSH: 0.792 u[IU]/mL (ref 0.350–4.500)

## 2021-10-31 MED ORDER — WHITE PETROLATUM EX OINT
TOPICAL_OINTMENT | CUTANEOUS | Status: AC
  Filled 2021-10-31: qty 5

## 2021-10-31 NOTE — BHH Group Notes (Signed)
Goals Group ?3/4//2023 ? ? ?Group Focus: affirmation, clarity of thought, and goals/reality orientation ?Treatment Modality:  Psychoeducation ?Interventions utilized were assignment, group exercise, and support ?Purpose: To be able to understand and verbalize the reason for their admission to the hospital. To understand that the medication helps with their chemical imbalance but they also need to work on their choices in life. To be challenged to develop a list of 30 positives about themselves. Also introduce the concept that "feelings" are not reality. ? ?Participation Level:  Did not attend ? ?Duane Salazar A ?

## 2021-10-31 NOTE — Progress Notes (Addendum)
[facility] MD Progress Note  10/31/2021 10:44 AM Duane Salazar  MRN:  [id number]  Subjective:  Duane Salazar reported " I am thinking about the people that did me wrong."  Duane Salazar was seen and evaluated face-to-face.  He is awake alert and oriented x3. Reporting passive suicidal ideations denied plan or intent.  Patient is able to contract for safety while on the unit.  Does report homicidal ideation towards " everyone that did me wrong."  Reports that he is continues to experience symptoms of worry and racing thoughts.  Reports auditory hallucinations " muffled voices."  denied command in nature.  Duane Salazar reported he is seeking disability for brain tumor and degenerative joint disease which will not allow him to move his hands.  Reports a history of substance abuse.  Patient is unable to recall longest stent of sobriety.  Denies that he has been compliant with medication prior to this admission.  Currently prescribed Thorazine, Depakote and gabapentin which he reports taking and tolerating well.  He is denying any medication side effects. Labs reviewed A1c 6.5 and CMP will repeat with valproic level. Support, encouragement and reassurances was provided.   Principal Problem: Schizoaffective disorder ([place]) Diagnosis: Principal Problem:   Schizoaffective disorder ([place])  Total Time spent with patient: 15 minutes  Past Psychiatric History: Charted history with schizoaffective disorder, substance use disorder and suicidal ideation.  He reported previous inpatient admissions.   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)    Sleep apnea 1995   on CPAP    Past Surgical History:  Procedure  Laterality Date   PITUITARY SURGERY  2005 & 2012   SKIN BIOPSY     Family History:  Family History  Problem Relation Age of Onset   Hypertension Mother    Diabetes Mother    Heart Problems Mother    Cancer Maternal Uncle    Alcoholism Maternal Uncle    Cancer Maternal Grandmother    Heart disease Neg Hx    Family Psychiatric  History:  Social History:  Social History   Substance and Sexual Activity  Alcohol Use Yes   Alcohol/week: 2.0 standard drinks   Types: 2 Cans of beer per week   Comment: 2 cans on workdays; 6-pk on days off; varies     Social History   Substance and Sexual Activity  Drug Use Yes   Types: Cocaine, Marijuana, Heroin   Comment: Reports use 2 grams Cocaine 10/28/21    Social History   Socioeconomic History   Marital status: Single    Spouse name: Not on file   Number of children: 2   Years of education: GED   Highest education level: Not on file  Occupational History   Not on file  Tobacco Use   Smoking status: Every Day    Packs/day: 1.00    Years: 20.00    Pack years: 20.00    Types: Cigarettes   Smokeless tobacco: Never   Tobacco comments:    Smoking .5 ppd  Substance and Sexual Activity   Alcohol use: Yes    Alcohol/week: 2.0 standard drinks    Types: 2  Cans of beer per week    Comment: 2 cans on workdays; 6-pk on days off; varies   Drug use: Yes    Types: Cocaine, Marijuana, Heroin    Comment: Reports use 2 grams Cocaine 10/28/21   Sexual activity: Yes  Other Topics Concern   Not on file  Social History Narrative   Lives with mom.   Incarcerated for 22 months in [place], [place]. From 2014-09/2014   Social Determinants of Health   Financial Resource Strain: Not on file  Food Insecurity: Not on file  Transportation Needs: Not on file  Physical Activity: Not on file  Stress: Not on file  Social Connections: Not on file   Additional Social History:                         Sleep: Fair  Appetite:  Fair  Current  Medications: Current Facility-Administered Medications  Medication Dose Route Frequency Provider Last Rate Last Admin   acetaminophen (TYLENOL) tablet 650 mg  650 mg Oral Q6H PRN White, Patrice L, NP       alum & mag hydroxide-simeth (MAALOX/MYLANTA) 200-200-20 MG/5ML suspension 30 mL  30 mL Oral Q4H PRN White, Patrice L, NP       benztropine (COGENTIN) tablet 1 mg  1 mg Oral BID White, Patrice L, NP   1 mg at 10/31/21 6222   celecoxib (CELEBREX) capsule 200 mg  200 mg Oral Daily White, Patrice L, NP   200 mg at 10/31/21 9798   chlorproMAZINE (THORAZINE) tablet 25 mg  25 mg Oral BID Briant Cedar, MD   25 mg at 10/31/21 9211   chlorproMAZINE (THORAZINE) tablet 50 mg  50 mg Oral QHS Briant Cedar, MD   50 mg at 10/30/21 2128   divalproex (DEPAKOTE) DR tablet 750 mg  750 mg Oral BID Briant Cedar, MD   750 mg at 10/31/21 9417   gabapentin (NEURONTIN) capsule 400 mg  400 mg Oral TID Darrol Angel L, NP   400 mg at 10/31/21 4081   hydrOXYzine (ATARAX) tablet 25 mg  25 mg Oral TID PRN Janine Limbo, MD       OLANZapine zydis (ZYPREXA) disintegrating tablet 5 mg  5 mg Oral Q6H PRN Massengill, Ovid Curd, MD       And   LORazepam (ATIVAN) tablet 1 mg  1 mg Oral Q6H PRN Massengill, Ovid Curd, MD       And   ziprasidone (GEODON) injection 20 mg  20 mg Intramuscular Q6H PRN Massengill, Ovid Curd, MD       losartan (COZAAR) tablet 25 mg  25 mg Oral Daily White, Patrice L, NP   25 mg at 10/31/21 4481   magnesium hydroxide (MILK OF MAGNESIA) suspension 30 mL  30 mL Oral Daily PRN White, Patrice L, NP       metFORMIN (GLUCOPHAGE) tablet 500 mg  500 mg Oral Q breakfast White, Patrice L, NP   500 mg at 10/31/21 8563   predniSONE (DELTASONE) tablet 5 mg  5 mg Oral Q breakfast White, Patrice L, NP   5 mg at 10/31/21 1497   traZODone (DESYREL) tablet 50 mg  50 mg Oral QHS PRN Janine Limbo, MD   50 mg at 10/30/21 2129    Lab Results:  Results for orders placed or performed during the  hospital encounter of 10/29/21 (from the past 48 hour(s))  Comprehensive metabolic panel     Status: Abnormal   Collection Time: 10/31/21  6:40 AM  Result Value Ref Range   Sodium 132 (L) 135 - 145 mmol/L   Potassium 3.8 3.5 - 5.1 mmol/L   Chloride 102 98 - 111 mmol/L   CO2 23 22 - 32 mmol/L   Glucose, Bld 164 (H) 70 - 99 mg/dL    Comment: Glucose reference range applies only to samples taken after fasting for at least 8 hours.   BUN 11 6 - 20 mg/dL   Creatinine, Ser 0.58 (L) 0.61 - 1.24 mg/dL   Calcium 8.6 (L) 8.9 - 10.3 mg/dL   Total Protein 6.5 6.5 - 8.1 g/dL   Albumin 3.3 (L) 3.5 - 5.0 g/dL   AST 22 15 - 41 U/L   ALT 24 0 - 44 U/L   Alkaline Phosphatase 48 38 - 126 U/L   Total Bilirubin 0.2 (L) 0.3 - 1.2 mg/dL   GFR, Estimated >60 >60 mL/min    Comment: (NOTE) Calculated using the CKD-EPI Creatinine Equation (2021)    Anion gap 7 5 - 15    Comment: Performed at [facility], [place] [address], [place], [place] [address]  TSH     Status: None   Collection Time: 10/31/21  6:40 AM  Result Value Ref Range   TSH 0.792 0.350 - 4.500 uIU/mL    Comment: Performed by a 3rd Generation assay with a functional sensitivity of <=0.01 uIU/mL. Performed at [facility], [place] [address] [place], [place] [address]   Lipid panel     Status: None   Collection Time: 10/31/21  6:40 AM  Result Value Ref Range   Cholesterol 128 0 - 200 mg/dL   Triglycerides 90 <150 mg/dL   HDL 42 >40 mg/dL   Total CHOL/HDL Ratio 3.0 RATIO   VLDL 18 0 - 40 mg/dL   LDL Cholesterol 68 0 - 99 mg/dL    Comment:        Total Cholesterol/HDL:CHD Risk Coronary Heart Disease Risk Table                     Men   Women  1/2 Average Risk   3.4   3.3  Average Risk       5.0   4.4  2 X Average Risk   9.6   7.1  3 X Average Risk  23.4   11.0        Use the calculated Patient Ratio above and the CHD Risk Table to determine the patient's CHD Risk.        ATP III CLASSIFICATION  (LDL):  <100     mg/dL   Optimal  100-129  mg/dL   Near or Above                    Optimal  130-159  mg/dL   Borderline  160-189  mg/dL   High  >190     mg/dL   Very High Performed at [place] [address] [place], [place] [address]   Hemoglobin A1c     Status: Abnormal   Collection Time: 10/31/21  6:40 AM  Result Value Ref Range   Hgb A1c MFr Bld 6.5 (H) 4.8 - 5.6 %    Comment: (NOTE) Pre diabetes:          5.7%-6.4%  Diabetes:              >6.4%  Glycemic control for   <7.0% adults with diabetes  Mean Plasma Glucose 139.85 mg/dL    Comment: Performed at [facility] [place] [address], [place], [place] [address]    Blood Alcohol level:  Lab Results  Component Value Date   [facility] <10 10/28/2021   ETH <10 67/89/3810    Metabolic Disorder Labs: Lab Results  Component Value Date   HGBA1C 6.5 (H) 10/31/2021   MPG 139.85 10/31/2021   No results found for: PROLACTIN Lab Results  Component Value Date   CHOL 128 10/31/2021   TRIG 90 10/31/2021   HDL 42 10/31/2021   CHOLHDL 3.0 10/31/2021   VLDL 18 10/31/2021   LDLCALC 68 10/31/2021   LDLCALC 81 09/04/2021    Physical Findings: AIMS:  , ,  ,  ,    CIWA:    COWS:     Musculoskeletal: Strength & Muscle Tone: within normal limits Gait & Station: normal Patient leans: N/A  Psychiatric Specialty Exam:  Presentation  General Appearance: Appropriate for Environment; Casual  Eye Contact:Fair  Speech:Clear and Coherent; Normal Rate  Speech Volume:Normal  Handedness:Left   Mood and Affect  Mood:Depressed  Affect:Congruent; Depressed   Thought Process  Thought Processes:Coherent; Goal Directed  Descriptions of Associations:Intact  Orientation:Full (Time, Place and Person)  Thought Content:Logical  History of Schizophrenia/Schizoaffective disorder:Yes  Duration of Psychotic Symptoms:Less than six months  Hallucinations:Hallucinations: Auditory Description of  Auditory Hallucinations: reports hearing his voice, in the past heard his families voice  Ideas of Reference:Paranoia  Suicidal Thoughts:Suicidal Thoughts: -- (Suicide Attempt prior to admission)  Homicidal Thoughts:Homicidal Thoughts: No   Sensorium  Memory:Immediate Fair; Recent Fair  Judgment:Intact  Insight:Present   Executive Functions  Concentration:Fair  Attention Span:Fair  [place]   Psychomotor Activity  Psychomotor Activity:Psychomotor Activity: Normal   Assets  Assets:Communication Skills; Desire for Improvement; Resilience   Sleep  Sleep:Sleep: Poor    Physical Exam: Physical Exam Vitals and nursing note reviewed.  Cardiovascular:     Rate and Rhythm: Normal rate.  Psychiatric:        Mood and Affect: Mood normal.        Behavior: Behavior normal.        Thought Content: Thought content normal.   Review of Systems  Eyes: Negative.   Cardiovascular: Negative.   Genitourinary: Negative.   Psychiatric/Behavioral:  Positive for depression, substance abuse and suicidal ideas. Negative for hallucinations. The patient is nervous/anxious. The patient does not have insomnia.   All other systems reviewed and are negative. Blood pressure (!) 142/102, pulse 99, temperature 98 F (36.7 C), temperature source Oral, resp. rate 18, height 6' 3.5" (1.918 m), weight 110.7 kg, SpO2 100 %. Body mass index is 30.1 kg/m.   Treatment Plan Summary: Daily contact with patient to assess and evaluate symptoms and progress in treatment and Medication management  Continue with current treatment plan on 10/31/2021 for below except were noted  Schizoaffective disorder: Major depressive disorder: Substance-induced mood disorder:  Continue Thorazine 25 mg p.o. twice daily Thorazine 50 mg p.o. nightly Continue Cogentin 1 mg p.o. twice daily Depakote 750 mg p.o. twice daily Continue gabapentin 400 mg p.o. 3 times  daily  LABS: Repeat CMP with Valproic Acid level on 11/02/2021  Patient encouraged to participate within the therapeutic milieu CSW to continue working on discharge disposition   Derrill Center, NP 10/31/2021, 10:44 AM

## 2021-10-31 NOTE — Progress Notes (Signed)
Adult Psychoeducational Group Note ? ?Date:  10/31/2021 ?Time:  10:09 PM ? ?Group Topic/Focus:  ?Wrap-Up Group:   The focus of this group is to help patients review their daily goal of treatment and discuss progress on daily workbooks. ? ?Participation Level:  Minimal ? ?Participation Quality:  Appropriate ? ?Affect:  Anxious, Depressed, Flat, and Irritable ? ?Cognitive:  Disorganized and Confused ? ?Insight: Limited ? ?Engagement in Group:  Limited ? ?Modes of Intervention:  Discussion ? ?Additional Comments:  Pt stated his goal for today was to focus on his treatment plan. Pt stated he accomplished his goal today. Pt stated he talked with his doctor and social worker about his care today. Pt rated his overall day a 5 out of 10. Pt stated he made no calls today. Pt stated he felt better about himself today. Pt stated he was able to attend all meals. Pt stated he took all medications provided today. Pt stated he attend all groups held today. Pt stated his appetite was pretty good today. Pt rated sleep last night was pretty good. Pt stated the goal tonight was to get some rest. Pt stated he had some physical pain tonight. Pt stated he some severe pain in his right hand. Pt rated the severe pain in his right hand a 8 on the pain level scale. Pt nurse was updated on the situation. Pt deny visual hallucinations and auditory issues tonight. Pt denies thoughts of harming himself or others. Pt stated he would alert staff if anything changed ? ?Candy Sledge ?10/31/2021, 10:09 PM ?

## 2021-10-31 NOTE — Progress Notes (Signed)
D. Pt presented with a sad affect/ depressed mood- calm, cooperative, but isolative behavior- appropriate during interactions. Pt stated that he stays in his room and does not attend groups because he is still paranoid around people,  and was waiting for the medication to help. Pt denied SI/HI and A/VH. A. Labs and vitals monitored. Pt given and educated on medications. Pt supported emotionally and encouraged to express concerns and ask questions.   ?R. Pt remains safe with 15 minute checks. Will continue POC. ? ?  ?

## 2021-10-31 NOTE — Progress Notes (Signed)
?   10/30/21 2130  ?Psych Admission Type (Psych Patients Only)  ?Admission Status Voluntary  ?Psychosocial Assessment  ?Patient Complaints Depression;Worrying;Irritability  ?Eye Contact Fair  ?Facial Expression Flat  ?Affect Depressed  ?Speech Logical/coherent  ?Interaction Assertive  ?Motor Activity Slow  ?Appearance/Hygiene Poor hygiene  ?Behavior Characteristics Appropriate to situation;Irritable  ?Mood Depressed  ?Thought Process  ?Coherency WDL  ?Content Paranoia  ?Delusions None reported or observed  ?Perception Hallucinations  ?Hallucination Auditory;Visual  ?Judgment Poor  ?Confusion None  ?Danger to Self  ?Current suicidal ideation? Passive  ?Self-Injurious Behavior No self-injurious ideation or behavior indicators observed or expressed   ?Agreement Not to Harm Self Yes  ?Description of Agreement Verbal Contract  ?Danger to Others  ?Danger to Others None reported or observed  ? ? ?

## 2021-10-31 NOTE — Group Note (Signed)
?  [facility] LCSW Group Therapy Note ? ?Date/Time:  10/31/2021 11:15AM-12:00PM ? ?Type of Therapy and Topic:  Group Therapy:  Feelings About Hospitalization ? ?Participation Level:  Did Not Attend  ? ?Description of Group ?This process group involved patients discussing their feelings related to being hospitalized, as well as the benefits they see to being in the hospital.  These feelings and benefits were itemized.  The group then brainstormed specific ways in which they could seek those same benefits when they discharge and return home. ? ?Therapeutic Goals ?Patient will identify and describe positive and negative feelings related to hospitalization ?Patient will verbalize benefits of hospitalization to themselves personally ?Patients will brainstorm together ways they can obtain similar benefits in the outpatient setting, identify barriers to wellness and possible solutions ? ?Summary of Patient Progress:  The patient did not attend group. ? ?Therapeutic Modalities ?Cognitive Behavioral Therapy ?Motivational Interviewing ? ? ? ?Selmer Dominion, LCSW ?10/31/2021, 12:58 PM ?   ?

## 2021-11-01 DIAGNOSIS — F251 Schizoaffective disorder, depressive type: Principal | ICD-10-CM

## 2021-11-01 MED ORDER — WHITE PETROLATUM EX OINT
TOPICAL_OINTMENT | CUTANEOUS | Status: AC
Start: 2021-11-01 — End: 2021-11-01
  Filled 2021-11-01: qty 10

## 2021-11-01 NOTE — Progress Notes (Signed)
D. Pt has been visible in the milieu, observed sitting in the dayroom with peers and seen attending groups today. Pt still endorses paranoia, but reported that he was trying to stay out of his room.  Pt currently denies SI/HI and AVH  ?A. Labs and vitals monitored. Pt compliant with medications. Pt supported emotionally and encouraged to express concerns and ask questions.   ?R. Pt remains safe with 15 minute checks. Will continue POC. ? ?  ?

## 2021-11-01 NOTE — Progress Notes (Signed)
?   10/31/21 2140  ?Psych Admission Type (Psych Patients Only)  ?Admission Status Voluntary  ?Psychosocial Assessment  ?Patient Complaints Worrying  ?Eye Contact Fair  ?Facial Expression Flat  ?Affect Depressed  ?Speech Logical/coherent  ?Interaction Assertive  ?Motor Activity Slow  ?Appearance/Hygiene Unremarkable  ?Behavior Characteristics Cooperative;Appropriate to situation  ?Mood Depressed  ?Thought Process  ?Coherency WDL  ?Content Paranoia  ?Delusions Paranoid  ?Perception Hallucinations  ?Hallucination None reported or observed  ?Judgment Poor  ?Confusion None  ?Danger to Self  ?Current suicidal ideation? Passive  ?Self-Injurious Behavior No self-injurious ideation or behavior indicators observed or expressed   ?Agreement Not to Harm Self Yes  ?Description of Agreement verbal contract  ?Danger to Others  ?Danger to Others None reported or observed  ? ?Patient safe on unit. Denies SI, HI and AVH. Patient received tylenol 650 for right hand pain 5/10 and was effective.  ?

## 2021-11-01 NOTE — [facility] Group Notes (Signed)
Adult Psychoeducational Group Not ?Date:  11/01/2021 ?Time:  7943-2761 ?Group Topic/Focus: PROGRESSIVE RELAXATION. A group where deep breathing is taught and tensing and relaxation muscle groups is used. Imagery is used as well.  Pts are asked to imagine 3 pillars that hold them up when they are not able to hold themselves up and to share that with the group. ? ?Participation Level:  Active ? ?Participation Quality:  Appropriate ? ?Affect:  Appropriate ? ?Cognitive:  Oriented ? ?Insight: Improving ? ?Engagement in Group:  Engaged ? ?Modes of Intervention:  Activity, Discussion, Education, and Support ? ?Additional Comments:  Pt attended the group. Rates his energy at a 4/10. God family and his grandchildren hold him up. ? ?Duane Salazar A ? ? ?

## 2021-11-01 NOTE — Progress Notes (Signed)
Adult Psychoeducational Group Note ? ?Date:  11/01/2021 ?Time:  8:14 PM ? ?Group Topic/Focus:  ?Wrap-Up Group:   The focus of this group is to help patients review their daily goal of treatment and discuss progress on daily workbooks. ? ?Participation Level:  Did Not Attend ? ?Participation Quality:   Did Not Attend ? ?Affect:   Did Not Attend  ? ?Cognitive:   Did Not Attend ? ?Insight: None ? ?Engagement in Group:  Did Not Attend ? ?Modes of Intervention:   Did Not Attend ? ?Additional Comments:  Pt was encouraged to attend wrap up group but did not attend. ? ?Candy Sledge ?11/01/2021, 8:14 PM ?

## 2021-11-01 NOTE — [facility] Group Notes (Signed)
Adult Psychoeducational Group Note ? ?Date:  11/01/2021 ?Time:  11:37 AM ? ?Group Topic/Focus:  ?Goals Group:   The focus of this group is to help patients establish daily goals to achieve during treatment and discuss how the patient can incorporate goal setting into their daily lives to aide in recovery. ? ?Participation Level:  Active ? ?Participation Quality:  Attentive ? ?Affect:  Appropriate ? ?Cognitive:  Appropriate ? ?Insight: Appropriate ? ?Engagement in Group:  Engaged ? ?Modes of Intervention:  Discussion ? ?Additional Comments:  Patient attended goals group and stay attentive the duration of it. Patient's goal was to attend all groups.  ? ?Darick Fetters T Ria Comment ?11/01/2021, 11:37 AM ?

## 2021-11-01 NOTE — Progress Notes (Addendum)
[facility] MD Progress Note  11/01/2021 12:07 PM Duane Salazar  MRN:  [id number]  Subjective:  " I have impulsive behavior to attack people when I am in a gathering.  That is why I stay by myself" Reason for admission:  Duane Salazar is a 46 yr old male who presented on 3/1 to [facility] for worsening depression and a Suicide Attempt via OD (Meth), he was admitted to [facility] on 3/3.  PPHx is significant for Schizoaffective Disorder, Anxiety, 1 previous Suicide Attempt (jump off bridge), and ~10 previous hospitalizations (latest [facility] 2017). Today's Note:   Chart Reviewed, care discussed with Members of our Interdisciplinary team.  Patient was seen in his room awake.  He endorsed SI/HI with no plans.  He also stated that he feels impulsive when to hurt people when he is in a gathering but also stated that he is not going to hurt people.  Patient admitted feeling paranoid all the time -people are out to hurt him and this makes him feel like hurting people to prevent them hurting him.  He is taking Thorazine and Depakote is started as well for mood stabilization.  He was in the dayroom yesterday evening with peers and the environment was safe.  He reported improved sleep and appetite as well.  He is homeless and so far not sure of where he will be going to.  Depakote Level is scheduled for 11/03/2021.  We will continue to monitor and make Medication changes as needed. Principal Problem: Schizoaffective disorder ([place]) Diagnosis: Principal Problem:   Schizoaffective disorder ([place])  Total Time spent with patient: 15 minutes  Past Psychiatric History: Charted history with schizoaffective disorder, substance use disorder and suicidal ideation.  He reported previous inpatient admissions.   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   Schizo affective schizophrenia ([place]) 1995   Schizophrenia ([place])    Seizures ([place])    Sleep apnea 1995   on CPAP    Past Surgical History:  Procedure Laterality Date   PITUITARY SURGERY  2005 & 2012   SKIN BIOPSY     Family History:  Family History  Problem Relation Age of Onset   Hypertension Mother    Diabetes Mother    Heart Problems Mother    Cancer Maternal Uncle    Alcoholism Maternal Uncle    Cancer Maternal Grandmother    Heart disease Neg Hx    Family Psychiatric  History:  Social History:  Social History   Substance and Sexual Activity  Alcohol Use Yes   Alcohol/week: 2.0 standard drinks   Types: 2 Cans of beer per week   Comment: 2 cans on workdays; 6-pk on days off; varies     Social History   Substance and Sexual Activity  Drug Use Yes   Types: Cocaine, Marijuana, Heroin   Comment: Reports use 2 grams Cocaine 10/28/21    Social History   Socioeconomic History   Marital status: Single    Spouse name: Not on file   Number of children: 2   Years of education: GED   Highest education level: Not on file  Occupational History   Not on file  Tobacco Use   Smoking status: Every Day  Packs/day: 1.00    Years: 20.00    Pack years: 20.00    Types: Cigarettes   Smokeless tobacco: Never   Tobacco comments:    Smoking .5 ppd  Substance and Sexual Activity   Alcohol use: Yes    Alcohol/week: 2.0 standard drinks    Types: 2 Cans of beer per week    Comment: 2 cans on workdays; 6-pk on days off; varies   Drug use: Yes    Types: Cocaine, Marijuana, Heroin    Comment: Reports use 2 grams Cocaine 10/28/21   Sexual activity: Yes  Other Topics Concern   Not on file  Social History Narrative   Lives with mom.   Incarcerated for 22 months in [place], [place]. From 2014-09/2014   Social Determinants of Health   Financial Resource Strain: Not on file  Food Insecurity: Not on file  Transportation Needs: Not on file   Physical Activity: Not on file  Stress: Not on file  Social Connections: Not on file   Additional Social History:                         Sleep: Fair  Appetite:  Fair  Current Medications: Current Facility-Administered Medications  Medication Dose Route Frequency Provider Last Rate Last Admin   acetaminophen (TYLENOL) tablet 650 mg  650 mg Oral Q6H PRN White, Patrice L, NP   650 mg at 10/31/21 2100   alum & mag hydroxide-simeth (MAALOX/MYLANTA) 200-200-20 MG/5ML suspension 30 mL  30 mL Oral Q4H PRN White, Patrice L, NP       benztropine (COGENTIN) tablet 1 mg  1 mg Oral BID White, Patrice L, NP   1 mg at 11/01/21 2683   celecoxib (CELEBREX) capsule 200 mg  200 mg Oral Daily White, Patrice L, NP   200 mg at 11/01/21 0825   chlorproMAZINE (THORAZINE) tablet 25 mg  25 mg Oral BID Briant Cedar, MD   25 mg at 11/01/21 0825   chlorproMAZINE (THORAZINE) tablet 50 mg  50 mg Oral QHS Briant Cedar, MD   50 mg at 10/31/21 2059   divalproex (DEPAKOTE) DR tablet 750 mg  750 mg Oral BID Briant Cedar, MD   750 mg at 11/01/21 0825   gabapentin (NEURONTIN) capsule 400 mg  400 mg Oral TID Darrol Angel L, NP   400 mg at 11/01/21 0825   hydrOXYzine (ATARAX) tablet 25 mg  25 mg Oral TID PRN Janine Limbo, MD       OLANZapine zydis (ZYPREXA) disintegrating tablet 5 mg  5 mg Oral Q6H PRN Massengill, Ovid Curd, MD       And   LORazepam (ATIVAN) tablet 1 mg  1 mg Oral Q6H PRN Massengill, Ovid Curd, MD       And   ziprasidone (GEODON) injection 20 mg  20 mg Intramuscular Q6H PRN Massengill, Ovid Curd, MD       losartan (COZAAR) tablet 25 mg  25 mg Oral Daily White, Patrice L, NP   25 mg at 11/01/21 4196   magnesium hydroxide (MILK OF MAGNESIA) suspension 30 mL  30 mL Oral Daily PRN White, Patrice L, NP       metFORMIN (GLUCOPHAGE) tablet 500 mg  500 mg Oral Q breakfast White, Patrice L, NP   500 mg at 11/01/21 0825   predniSONE (DELTASONE) tablet 5 mg  5 mg Oral Q breakfast  White, Patrice L, NP   5 mg at 11/01/21 0825   traZODone (  DESYREL) tablet 50 mg  50 mg Oral QHS PRN Janine Limbo, MD   50 mg at 10/31/21 2100    Lab Results:  Results for orders placed or performed during the hospital encounter of 10/29/21 (from the past 48 hour(s))  Comprehensive metabolic panel     Status: Abnormal   Collection Time: 10/31/21  6:40 AM  Result Value Ref Range   Sodium 132 (L) 135 - 145 mmol/L   Potassium 3.8 3.5 - 5.1 mmol/L   Chloride 102 98 - 111 mmol/L   CO2 23 22 - 32 mmol/L   Glucose, Bld 164 (H) 70 - 99 mg/dL    Comment: Glucose reference range applies only to samples taken after fasting for at least 8 hours.   BUN 11 6 - 20 mg/dL   Creatinine, Ser 0.58 (L) 0.61 - 1.24 mg/dL   Calcium 8.6 (L) 8.9 - 10.3 mg/dL   Total Protein 6.5 6.5 - 8.1 g/dL   Albumin 3.3 (L) 3.5 - 5.0 g/dL   AST 22 15 - 41 U/L   ALT 24 0 - 44 U/L   Alkaline Phosphatase 48 38 - 126 U/L   Total Bilirubin 0.2 (L) 0.3 - 1.2 mg/dL   GFR, Estimated >60 >60 mL/min    Comment: (NOTE) Calculated using the CKD-EPI Creatinine Equation (2021)    Anion gap 7 5 - 15    Comment: Performed at [facility], [place] [address], [place], [place] [address]  TSH     Status: None   Collection Time: 10/31/21  6:40 AM  Result Value Ref Range   TSH 0.792 0.350 - 4.500 uIU/mL    Comment: Performed by a 3rd Generation assay with a functional sensitivity of <=0.01 uIU/mL. Performed at [facility], [place] [address] [place], [place] [address]   Lipid panel     Status: None   Collection Time: 10/31/21  6:40 AM  Result Value Ref Range   Cholesterol 128 0 - 200 mg/dL   Triglycerides 90 <150 mg/dL   HDL 42 >40 mg/dL   Total CHOL/HDL Ratio 3.0 RATIO   VLDL 18 0 - 40 mg/dL   LDL Cholesterol 68 0 - 99 mg/dL    Comment:        Total Cholesterol/HDL:CHD Risk Coronary Heart Disease Risk Table                     Men   Women  1/2 Average Risk   3.4   3.3  Average Risk        5.0   4.4  2 X Average Risk   9.6   7.1  3 X Average Risk  23.4   11.0        Use the calculated Patient Ratio above and the CHD Risk Table to determine the patient's CHD Risk.        ATP III CLASSIFICATION (LDL):  <100     mg/dL   Optimal  100-129  mg/dL   Near or Above                    Optimal  130-159  mg/dL   Borderline  160-189  mg/dL   High  >190     mg/dL   Very High Performed at [facility] [address] [place], [place] [address]   Hemoglobin A1c     Status: Abnormal   Collection Time: 10/31/21  6:40 AM  Result Value  Ref Range   Hgb A1c MFr Bld 6.5 (H) 4.8 - 5.6 %    Comment: (NOTE) Pre diabetes:          5.7%-6.4%  Diabetes:              >6.4%  Glycemic control for   <7.0% adults with diabetes    Mean Plasma Glucose 139.85 mg/dL    Comment: Performed at [facility] [address] [place], [place] [address]    Blood Alcohol level:  Lab Results  Component Value Date   [facility] <10 10/28/2021   ETH <10 56/97/9480    Metabolic Disorder Labs: Lab Results  Component Value Date   HGBA1C 6.5 (H) 10/31/2021   MPG 139.85 10/31/2021   No results found for: PROLACTIN Lab Results  Component Value Date   CHOL 128 10/31/2021   TRIG 90 10/31/2021   HDL 42 10/31/2021   CHOLHDL 3.0 10/31/2021   VLDL 18 10/31/2021   LDLCALC 68 10/31/2021   LDLCALC 81 09/04/2021    Physical Findings: AIMS:  , ,  ,  ,    CIWA:    COWS:     Musculoskeletal: Strength & Muscle Tone: within normal limits Gait & Station: normal Patient leans: N/A  Psychiatric Specialty Exam:  Presentation  General Appearance: Casual; Appropriate for Environment  Eye Contact:Good  Speech:Normal Rate; Clear and Coherent  Speech Volume:Normal  Handedness:Left   Mood and Affect  Mood:Depressed  Affect:Congruent   Thought Process  Thought Processes:Coherent; Goal Directed  Descriptions of Associations:Intact  Orientation:Full (Time, Place and  Person)  Thought Content:Logical  History of Schizophrenia/Schizoaffective disorder:Yes  Duration of Psychotic Symptoms:Less than six months  Hallucinations:Hallucinations: Auditory Description of Auditory Hallucinations: voices telling him people are out to attack   Ideas of Reference:Paranoia  Suicidal Thoughts:Suicidal Thoughts: Yes, Passive SI Passive Intent and/or Plan: Without Intent; Without Plan; Without Access to Means; Without Means to Carry Out   Homicidal Thoughts:Homicidal Thoughts: Yes, Passive HI Passive Intent and/or Plan: Without Intent; Without Plan; Without Means to Carry Out; With Access to Means    Sensorium  Memory:Immediate Fair; Recent Fair  Judgment:Good  Insight:Good   Executive Functions  Concentration:Good  Attention Span:Good  [place] of Knowledge:Good  Language:Good   Psychomotor Activity  Psychomotor Activity:Psychomotor Activity: Normal    Assets  Assets:Communication Skills; Desire for Improvement; Resilience   Sleep  Sleep:Sleep: Good     Physical Exam: Physical Exam Vitals and nursing note reviewed.  Cardiovascular:     Rate and Rhythm: Normal rate.  Psychiatric:        Mood and Affect: Mood normal.        Behavior: Behavior normal.        Thought Content: Thought content normal.   Review of Systems  Eyes: Negative.   Cardiovascular: Negative.   Genitourinary: Negative.   Psychiatric/Behavioral:  Positive for depression, substance abuse and suicidal ideas. Negative for hallucinations. The patient is nervous/anxious. The patient does not have insomnia.   All other systems reviewed and are negative. Blood pressure (!) 130/97, pulse (!) 105, temperature 97.8 F (36.6 C), temperature source Oral, resp. rate 18, height 6' 3.5" (1.918 m), weight 110.7 kg, SpO2 100 %. Body mass index is 30.1 kg/m. Treatment Plan Summary: Daily contact with patient to assess and evaluate symptoms and progress in  treatment and Medication management Schizoaffective disorder: Major depressive disorder: Substance-induced mood disorder:  Continue Thorazine 25 mg p.o. twice daily Thorazine 50 mg p.o. nightly Continue  Cogentin 1 mg p.o. twice daily Depakote 750 mg p.o. twice daily Continue gabapentin 400 mg p.o. 3 times daily  LABS: Repeat CMP with Valproic Acid level on 11/03/2021  Patient encouraged to participate within the therapeutic milieu CSW to continue working on discharge disposition   Delfin Gant, NP-PMHNP-BC 11/01/2021, 12:07 PM

## 2021-11-01 NOTE — Group Note (Signed)
[facility] LCSW Group Therapy Note ? ?Date/Time:  11/01/2021  11:00AM-12:00PM ? ?Type of Therapy and Topic:  Group Therapy:  Music and Mood ? ?Participation Level:  Active  ? ?Description of Group: ?In this process group, members listened to a variety of genres of music and identified that different types of music evoke different responses.  Patients were encouraged to identify music that was soothing for them and music that was energizing for them.  Patients discussed how this knowledge can help with wellness and recovery in various ways including managing depression and anxiety as well as encouraging healthy sleep habits.   ? ?Therapeutic Goals: ?Patients will explore the impact of different varieties of music on mood ?Patients will verbalize the thoughts they have when listening to different types of music ?Patients will identify music that is soothing to them as well as music that is energizing to them ?Patients will discuss how to use this knowledge to assist in maintaining wellness and recovery ?Patients will explore the use of music as a coping skill ? ?Summary of Patient Progress:  At the beginning of group, patient expressed his mood was a little depressed and a little anxious.  He spoke positively about the impact of several songs.  At the end of group, patient expressed his mood was "the same."   ? ?Therapeutic Modalities: ?Solution Focused Brief Therapy ?Activity ? ? ?Selmer Dominion, LCSW ? ? ?

## 2021-11-02 MED ORDER — CHLORPROMAZINE HCL 25 MG PO TABS
75.0000 mg | ORAL_TABLET | Freq: Every day | ORAL | Status: DC
Start: 2021-11-02 — End: 2021-11-04
  Administered 2021-11-02 – 2021-11-03 (×2): 75 mg via ORAL
  Filled 2021-11-02 (×4): qty 3

## 2021-11-02 MED ORDER — MENTHOL 3 MG MT LOZG
1.0000 | LOZENGE | OROMUCOSAL | Status: DC | PRN
  Administered 2021-11-02 – 2021-11-04 (×6): 3 mg via ORAL
  Filled 2021-11-02 (×8): qty 9

## 2021-11-02 MED ORDER — PROPRANOLOL HCL 10 MG PO TABS
10.0000 mg | ORAL_TABLET | Freq: Three times a day (TID) | ORAL | Status: DC
  Administered 2021-11-02 – 2021-11-11 (×28): 10 mg via ORAL
  Filled 2021-11-02 (×2): qty 1
  Filled 2021-11-02: qty 21
  Filled 2021-11-02 (×6): qty 1
  Filled 2021-11-02: qty 21
  Filled 2021-11-02 (×5): qty 1
  Filled 2021-11-02: qty 21
  Filled 2021-11-02 (×19): qty 1

## 2021-11-02 MED ORDER — GABAPENTIN 300 MG PO CAPS
600.0000 mg | ORAL_CAPSULE | Freq: Three times a day (TID) | ORAL | Status: DC
  Administered 2021-11-02 – 2021-11-04 (×7): 600 mg via ORAL
  Filled 2021-11-02 (×10): qty 2

## 2021-11-02 MED ORDER — CHLORPROMAZINE HCL 25 MG PO TABS
37.5000 mg | ORAL_TABLET | Freq: Two times a day (BID) | ORAL | Status: DC
  Administered 2021-11-02 – 2021-11-03 (×3): 37.5 mg via ORAL
  Filled 2021-11-02 (×5): qty 2

## 2021-11-02 MED ORDER — ENSURE ENLIVE PO LIQD
237.0000 mL | ORAL | Status: DC
  Administered 2021-11-03 – 2021-11-11 (×9): 237 mL via ORAL
  Filled 2021-11-02 (×11): qty 237

## 2021-11-02 NOTE — Progress Notes (Signed)
Pt was encouraged but didn't attend orientation/goals group. ?

## 2021-11-02 NOTE — Progress Notes (Signed)
NUTRITION ASSESSMENT ? ?Pt identified as at risk on the Malnutrition Screen Tool ? ?INTERVENTION: ?1. Supplements: Ensure Enlive po daily, each supplement provides 350 kcal and 20 grams of protein.  ? ?NUTRITION DIAGNOSIS: ?Unintentional weight loss related to sub-optimal intake as evidenced by pt report.  ? ?Goal: ?Pt to meet >/= 90% of their estimated nutrition needs. ? ?Monitor:  ?PO intake ? ?Assessment:  ?Pt admitted for depression and SA (OD on meth). Pt is homeless.  ?Pt reports improving appetite.  ? ?Per weight records, pt has lost 34 lbs since 02/07/21 (12% wt loss x 8.5 months, insignificant for time frame). Weight has been trending down since October 2021. ?Will order Ensure supplements for additional protein. ? ?Height: ?Ht Readings from Last 1 Encounters:  ?10/29/21 6' 3.5" (1.918 m)  ? ? ?Weight: ?Wt Readings from Last 1 Encounters:  ?10/29/21 110.7 kg  ? ? ?Weight Hx: ?Wt Readings from Last 10 Encounters:  ?10/29/21 110.7 kg  ?09/04/21 116.7 kg  ?02/07/21 126.6 kg  ?06/25/20 (!) 138.8 kg  ?05/26/20 119.3 kg  ?05/20/20 127 kg  ?04/25/20 125.2 kg  ?03/13/20 135.2 kg  ?02/20/20 135.6 kg  ?02/11/20 135.2 kg  ? ? ?BMI:  Body mass index is 30.1 kg/m?Marland Kitchen ?Pt meets criteria for obesity based on current BMI. ? ?Estimated Nutritional Needs: ?Kcal: 25-30 kcal/kg ?Protein: > 1 gram protein/kg ?Fluid: 1 ml/kcal ? ?Diet Order:  ?Diet Order   ? ?       ?  Diet Carb Modified Fluid consistency: Thin; Room service appropriate? Yes  Diet effective now       ?  ? ?  ?  ? ?  ? ?Pt is also offered choice of unit snacks mid-morning and mid-afternoon.  ?Pt is eating as desired.  ? ?Lab results and medications reviewed.  ? ?Clayton Bibles, MS, RD, LDN ?Inpatient Clinical Dietitian ?Contact information available via Amion ? ? ?

## 2021-11-02 NOTE — Group Note (Signed)
[facility] LCSW Group Therapy Note ? ?Date/Time: 11/02/2021 @ 1pm ? ?Type of Therapy and Topic:  Group Therapy:  Communication ? ?Participation Level:  Did not attend ? ?Description of Group:   ? In this group patients will be encouraged to explore how individuals communicate with one another appropriately and inappropriately. Patients will be guided to discuss their thoughts, feelings, and behaviors related to barriers communicating feelings, needs, and stressors. The group will process together ways to execute positive and appropriate communications, with attention given to how one use behavior, tone, and body language to communicate. Patient will be encouraged to reflect on an incident where they were successfully able to communicate and the factors that they believe helped them to communicate. Each patient will be encouraged to identify specific changes they are motivated to make in order to overcome communication barriers with self, peers, authority, and parents. This group will be process-oriented, with patients participating in exploration of their own experiences as well as giving and receiving support and challenging self as well as other group members. ? ?Therapeutic Goals: ?Patient will identify how people communicate (body language, facial expression, and electronics) Also discuss tone, voice and how these impact what is communicated and how the message is perceived.  ?Patient will identify feelings (such as fear or worry), thought process and behaviors related to why people internalize feelings rather than express self openly. ?Patient will identify two changes they are willing to make to overcome communication barriers. ?Members will then practice through Role Play how to communicate by utilizing psycho-education material (such as I Feel statements and acknowledging feelings rather than displacing on others) ? ? ?Summary of Patient Progress: Did not attend ? ? ? ? ?Therapeutic Modalities:   ?Cognitive  Behavioral Therapy ?Solution Focused Therapy ?Motivational Interviewing ?Family Systems Approach ? ?

## 2021-11-02 NOTE — Progress Notes (Signed)
?   11/02/21 0100  ?Psych Admission Type (Psych Patients Only)  ?Admission Status Voluntary  ?Psychosocial Assessment  ?Patient Complaints Worrying  ?Eye Contact Fair  ?Facial Expression Flat  ?Affect Appropriate to circumstance  ?Speech Logical/coherent  ?Interaction Assertive  ?Motor Activity Slow  ?Appearance/Hygiene Unremarkable  ?Behavior Characteristics Cooperative;Appropriate to situation  ?Mood Depressed  ?Aggressive Behavior  ?Effect No apparent injury  ?Thought Process  ?Coherency WDL  ?Content WDL  ?Delusions Paranoid  ?Perception Hallucinations  ?Hallucination None reported or observed  ?Judgment Poor  ?Confusion None  ?Danger to Self  ?Current suicidal ideation? Passive  ?Self-Injurious Behavior No self-injurious ideation or behavior indicators observed or expressed   ?Agreement Not to Harm Self Yes  ?Description of Agreement verbal contract  ?Danger to Others  ?Danger to Others None reported or observed  ? ?Patient A+O x 3. Denies SI, HI and AVH. Patient received zyprexa 5 mg for anxiety and trazodone for sleep and was effective. Patient is in bed, appears asleep, rise and fall of chest noted. Will continue to monitor q 15 minutes and update as needed. ?

## 2021-11-02 NOTE — Plan of Care (Signed)
?  Problem: Activity: ?Goal: Interest or engagement in activities will improve ?Outcome: Progressing ?  ?Problem: Coping: ?Goal: Ability to verbalize frustrations and anger appropriately will improve ?Outcome: Progressing ?  ?Problem: Coping: ?Goal: Ability to demonstrate self-control will improve ?Outcome: Progressing ?  ?Problem: Safety: ?Goal: Periods of time without injury will increase ?Outcome: Progressing ?  ?Problem: Education: ?Goal: Knowledge of the prescribed therapeutic regimen will improve ?Outcome: Progressing ?  ?Problem: Coping: ?Goal: Coping ability will improve ?Outcome: Progressing ?  ?

## 2021-11-02 NOTE — Group Note (Signed)
Recreation Therapy Group Note ? ? ?Group Topic:Coping Skills  ?Group Date: 11/02/2021 ?Start Time: 1000 ?End Time: 1100 ?Facilitators: Victorino Sparrow, LRT,CTRS ?Location: [place] ? ? ?Goal Area(s) Addresses:  ?Patient will identify positive stress management techniques. ?Patient will identify benefits of using stress management post d/c. ? ?Group Description:  Meditation.  LRT played a meditation that focused on using your day to restore, forgive and show grace to yourself and others.  The meditation also focused on loving every part of yourself flaws and all.  Patients were to listen and follow along with the meditation as it played to fully engage in activity. ? ? ?Affect/Mood: Flat ?  ?Participation Level: Moderate ?  ?Participation Quality: Independent ?  ?Behavior: Appropriate ?  ?Speech/Thought Process: Focused ?  ?Insight: Moderate ?  ?Judgement: Moderate ?  ?Modes of Intervention: Group work and Music ?  ?Patient Response to Interventions:  Attentive and Engaged ?  ?Education Outcome: ? Acknowledges education and In group clarification offered   ? ?Clinical Observations/Individualized Feedback: Pt came in half way through group and ended up working alone.  Pt was quiet but pleasant during group session.  Pt was called of group to meet with doctor but was able to come up with coping skills for anxiety like biking, camping, exercising, singing and dancing.  ? ? ?Plan: Continue to engage patient in RT group sessions 2-3x/week. ? ? ?Victorino Sparrow, LRT,CTRS ?11/02/2021 1:59 PM ?

## 2021-11-02 NOTE — [facility] Group Notes (Signed)
?  Spirituality group facilitated by Kathrynn Humble, [place].  ? ?Group Description: Group focused on topic of hope. Patients participated in facilitated discussion around topic, connecting with one another around experiences and definitions for hope. Group members engaged with visual explorer photos, reflecting on what hope looks like for them today. Group engaged in discussion around how their definitions of hope are present today in hospital.  ? ?Modalities: Psycho-social ed, [place], Narrative, MI  ? ?Patient Progress: Patient was encouraged, but did not attend group. ? ?

## 2021-11-02 NOTE — Progress Notes (Signed)
?   11/02/21 2200  ?Psych Admission Type (Psych Patients Only)  ?Admission Status Voluntary  ?Psychosocial Assessment  ?Patient Complaints Anxiety  ?Eye Contact Intense  ?Facial Expression Masked  ?Affect Flat  ?Speech Logical/coherent  ?Interaction Assertive  ?Motor Activity Slow  ?Appearance/Hygiene Improved  ?Behavior Characteristics Appropriate to situation;Cooperative  ?Mood Depressed  ?Thought Process  ?Coherency WDL  ?Content WDL  ?Delusions None reported or observed  ?Perception Hallucinations  ?Hallucination Auditory  ?Judgment Poor  ?Confusion None  ?Danger to Self  ?Current suicidal ideation? Passive  ?Self-Injurious Behavior No self-injurious ideation or behavior indicators observed or expressed   ?Agreement Not to Harm Self Yes  ?Description of Agreement  ?(verbal)  ?Danger to Others  ?Danger to Others None reported or observed  ? ?Pt is alert and oriented, observed ambulating on the hall way with care, reported right leg pain due arthritis. Pt given vistaril 25 mg PO and trazodone 50 mg PO for sleep. Pt stated he would like to stay here in the hospital for some day as to get proper diagnosis. Pt encouraged to talk to the provider in the am, will continue to monitor. ?

## 2021-11-02 NOTE — Progress Notes (Signed)
?   11/02/21 0820  ?Psych Admission Type (Psych Patients Only)  ?Admission Status Voluntary  ?Psychosocial Assessment  ?Patient Complaints Worrying  ?Eye Contact Fair  ?Facial Expression Flat  ?Affect Flat  ?Speech Logical/coherent  ?Interaction Assertive;Isolative  ?Motor Activity Slow  ?Appearance/Hygiene Unremarkable  ?Behavior Characteristics Cooperative  ?Mood Depressed  ?Thought Process  ?Coherency WDL  ?Content WDL  ?Delusions Paranoid  ?Perception Hallucinations  ?Hallucination None reported or observed  ?Judgment Poor  ?Confusion None  ?Danger to Self  ?Current suicidal ideation? Passive  ?Self-Injurious Behavior No self-injurious ideation or behavior indicators observed or expressed   ?Agreement Not to Harm Self Yes  ?Description of Agreement verbally cocntracts for safety  ?Danger to Others  ?Danger to Others None reported or observed  ? ? ?

## 2021-11-02 NOTE — Progress Notes (Signed)
[facility] MD Progress Note  11/02/2021 11:33 AM Duane Salazar  MRN:  [id number]  Subjective:   Duane Salazar is a 46 yr old male who presented on 3/1 to [facility] for worsening depression and a Suicide Attempt via OD (Meth), he was admitted to [facility] on 3/3.  PPHx is significant for Schizoaffective Disorder, Anxiety, 1 previous Suicide Attempt (jump off bridge), and ~10 previous hospitalizations (latest [facility] 2017).  Yesterday the psychiatry team made the following recommendations: Continue Thorazine 25 mg p.o. twice daily Thorazine 50 mg p.o. nightly Continue Cogentin 1 mg p.o. twice daily Depakote 750 mg p.o. twice daily Continue gabapentin 400 mg p.o. 3 times daily Repeat CMP with Valproic Acid level on 11/03/2021    On my assessment today, the patient reports that he feels on edge and aggressive, especially when he is around others.  He reports he is willing to approach staff and this Probation officer, if he feels that he is no longer able to control his impulses.  He reports feeling anxious, due to this.  He reports having homicidal thoughts, without intent, without plan, and without specific victim inside this psychiatric unit or outside the hospital, at this time of my examination today.   He reports having passive suicidal thoughts, without intent or plan.  Is able to contract for safety in the hospital. He reports that his mood is up and down, more anxious than depressed.  He reports that sleep is improving.  He reports that appetite is stable. He reports auditory hallucinations, off and on, less intense and less loud since admission. He denies VH.  Reports some paranoia. Patient is agreeable with increasing Thorazine for impulse control, feeling on edge, reports this medication has been calming since starting it in the hospital. Patient is agreeable to increasing gabapentin for anxiety. Patient is agreeable to starting propranolol for anxiety.  He denies starting clonidine, as he reports taking this medication in  the past that made him too drowsy.  Patient reports dry mouth off and on during the day, and is agreeable to start lozenge for this and increase fluid intake.  He otherwise denies having side effects to current psychiatric medications.  Patient was made aware of labs drawn tomorrow, and is agreeable.  Per nursing, patient is unable to attend groups and also recreational therapy, without incident.  Principal Problem: Schizoaffective disorder ([place]) Diagnosis: Principal Problem:   Schizoaffective disorder ([place])  Total Time spent with patient: 15 minutes  Past Psychiatric History: Charted history with schizoaffective disorder, substance use disorder and suicidal ideation.  He reported previous inpatient admissions.   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   Schizo affective schizophrenia ([place]) 1995   Schizophrenia ([place])    Seizures ([place])    Sleep apnea 1995   on CPAP    Past Surgical History:  Procedure Laterality Date   PITUITARY SURGERY  2005 & 2012   SKIN BIOPSY     Family History:  Family History  Problem Relation Age of Onset   Hypertension Mother    Diabetes Mother    Heart Problems Mother    Cancer Maternal Uncle    Alcoholism Maternal Uncle    Cancer Maternal Grandmother    Heart disease  Neg Hx    Family Psychiatric  History:  Social History:  Social History   Substance and Sexual Activity  Alcohol Use Yes   Alcohol/week: 2.0 standard drinks   Types: 2 Cans of beer per week   Comment: 2 cans on workdays; 6-pk on days off; varies     Social History   Substance and Sexual Activity  Drug Use Yes   Types: Cocaine, Marijuana, Heroin   Comment: Reports use 2 grams Cocaine 10/28/21    Social History   Socioeconomic  History   Marital status: Single    Spouse name: Not on file   Number of children: 2   Years of education: GED   Highest education level: Not on file  Occupational History   Not on file  Tobacco Use   Smoking status: Every Day    Packs/day: 1.00    Years: 20.00    Pack years: 20.00    Types: Cigarettes   Smokeless tobacco: Never   Tobacco comments:    Smoking .5 ppd  Substance and Sexual Activity   Alcohol use: Yes    Alcohol/week: 2.0 standard drinks    Types: 2 Cans of beer per week    Comment: 2 cans on workdays; 6-pk on days off; varies   Drug use: Yes    Types: Cocaine, Marijuana, Heroin    Comment: Reports use 2 grams Cocaine 10/28/21   Sexual activity: Yes  Other Topics Concern   Not on file  Social History Narrative   Lives with mom.   Incarcerated for 22 months in [place], [place]. From 2014-09/2014   Social Determinants of Health   Financial Resource Strain: Not on file  Food Insecurity: Not on file  Transportation Needs: Not on file  Physical Activity: Not on file  Stress: Not on file  Social Connections: Not on file   Additional Social History:                         Sleep: Fair  Appetite:  Fair  Current Medications: Current Facility-Administered Medications  Medication Dose Route Frequency Provider Last Rate Last Admin   acetaminophen (TYLENOL) tablet 650 mg  650 mg Oral Q6H PRN White, Patrice L, NP   650 mg at 11/01/21 1616   alum & mag hydroxide-simeth (MAALOX/MYLANTA) 200-200-20 MG/5ML suspension 30 mL  30 mL Oral Q4H PRN White, Patrice L, NP       benztropine (COGENTIN) tablet 1 mg  1 mg Oral BID White, Patrice L, NP   1 mg at 11/02/21 1610   celecoxib (CELEBREX) capsule 200 mg  200 mg Oral Daily White, Patrice L, NP   200 mg at 11/02/21 9604   chlorproMAZINE (THORAZINE) tablet 37.5 mg  37.5 mg Oral BID Lakedra Washington, Ovid Curd, MD       chlorproMAZINE (THORAZINE) tablet 75 mg  75 mg Oral QHS Shanecia Hoganson, MD       divalproex (DEPAKOTE)  DR tablet 750 mg  750 mg Oral BID Briant Cedar, MD   750 mg at 11/02/21 0816   gabapentin (NEURONTIN) capsule 600 mg  600 mg Oral TID Janine Limbo, MD       hydrOXYzine (ATARAX) tablet 25 mg  25 mg Oral TID PRN Creasie Lacosse, Ovid Curd, MD       OLANZapine zydis (ZYPREXA) disintegrating tablet 5 mg  5 mg Oral Q6H PRN Marivel Mcclarty, Ovid Curd, MD   5 mg at 11/01/21 2130   And   LORazepam (ATIVAN) tablet  1 mg  1 mg Oral Q6H PRN Lexiana Spindel, Ovid Curd, MD       And   ziprasidone (GEODON) injection 20 mg  20 mg Intramuscular Q6H PRN Tateanna Bach, Ovid Curd, MD       losartan (COZAAR) tablet 25 mg  25 mg Oral Daily White, Patrice L, NP   25 mg at 11/02/21 0815   magnesium hydroxide (MILK OF MAGNESIA) suspension 30 mL  30 mL Oral Daily PRN White, Patrice L, NP       menthol-cetylpyridinium (CEPACOL) lozenge 3 mg  1 lozenge Oral PRN Rease Wence, Ovid Curd, MD       metFORMIN (GLUCOPHAGE) tablet 500 mg  500 mg Oral Q breakfast White, Patrice L, NP   500 mg at 11/02/21 0815   predniSONE (DELTASONE) tablet 5 mg  5 mg Oral Q breakfast White, Patrice L, NP   5 mg at 11/02/21 0815   propranolol (INDERAL) tablet 10 mg  10 mg Oral TID Janine Limbo, MD       traZODone (DESYREL) tablet 50 mg  50 mg Oral QHS PRN Janine Limbo, MD   50 mg at 11/01/21 2121    Lab Results:  No results found for this or any previous visit (from the past 48 hour(s)).   Blood Alcohol level:  Lab Results  Component Value Date   ETH <10 10/28/2021   ETH <10 35/32/9924    Metabolic Disorder Labs: Lab Results  Component Value Date   HGBA1C 6.5 (H) 10/31/2021   MPG 139.85 10/31/2021   No results found for: PROLACTIN Lab Results  Component Value Date   CHOL 128 10/31/2021   TRIG 90 10/31/2021   HDL 42 10/31/2021   CHOLHDL 3.0 10/31/2021   VLDL 18 10/31/2021   LDLCALC 68 10/31/2021   LDLCALC 81 09/04/2021    Physical Findings: AIMS:  , ,  ,  ,   Aims score 0 on my examination today, 11/02/2021. CIWA:    COWS:      Musculoskeletal: Strength & Muscle Tone: within normal limits Gait & Station: normal Patient leans: N/A  Psychiatric Specialty Exam:  Presentation  General Appearance: Casual; Appropriate for Environment  Eye Contact:Good  Speech:Normal Rate; Clear and Coherent  Speech Volume:Normal  Handedness:Left   Mood and Affect  Mood: Anxious  Affect:Congruent Appearing withdrawn  Thought Process  Thought Processes:Coherent; Goal Directed  Descriptions of Associations:Intact  Orientation:Full (Time, Place and Person)  Thought Content:Logical  History of Schizophrenia/Schizoaffective disorder:Yes  Duration of Psychotic Symptoms:Less than six months  Hallucinations:Hallucinations: Auditory Description of Auditory Hallucinations: voices telling him people are out to attack   Ideas of Reference:Paranoia  Suicidal Thoughts:Suicidal Thoughts: Yes, Passive SI Passive Intent and/or Plan: Without Intent; Without Plan; Without Access to Means; Without Means to Carry Out   Homicidal Thoughts:Homicidal Thoughts: Yes, Passive HI Passive Intent and/or Plan: Without Intent; Without Plan; Without Means to Carry Out; With Access to Means    Sensorium  Memory:Immediate Fair; Recent Fair  Judgment:Good  Insight:Good   Executive Functions  Concentration:Good  Attention Span:Good  [place] of Knowledge:Good  Language:Good   Psychomotor Activity  Psychomotor Activity:Psychomotor Activity: Normal    Assets  Assets:Communication Skills; Desire for Improvement; Resilience   Sleep  Sleep:Sleep: Good     Physical Exam: Physical Exam Vitals and nursing note reviewed.  Constitutional:      General: He is not in acute distress.    Appearance: He is not toxic-appearing.  Cardiovascular:     Rate and Rhythm: Normal rate.  Pulmonary:     Effort: Pulmonary effort is normal.  Neurological:     Mental Status: He is alert.  Psychiatric:        Mood  and Affect: Mood normal.        Behavior: Behavior normal.        Thought Content: Thought content normal.   Review of Systems  Constitutional:  Negative for chills and fever.  Eyes: Negative.   Cardiovascular: Negative.  Negative for chest pain and palpitations.  Genitourinary: Negative.   Neurological:  Negative for dizziness, tingling, tremors and headaches.  Psychiatric/Behavioral:  Positive for depression, substance abuse and suicidal ideas. Negative for hallucinations. The patient is nervous/anxious. The patient does not have insomnia.   All other systems reviewed and are negative.  Blood pressure (!) 122/98, pulse (!) 101, temperature 97.6 F (36.4 C), temperature source Oral, resp. rate 18, height 6' 3.5" (1.918 m), weight 110.7 kg, SpO2 100 %. Body mass index is 30.1 kg/m. Treatment Plan Summary: Daily contact with patient to assess and evaluate symptoms and progress in treatment and Medication management Schizoaffective disorder: Major depressive disorder: Substance-induced mood disorder:   Plan: -Increase Thorazine to 37.5 mg at 8 AM, 37.5 mg at noon, and 75 mg at bedtime -for psychosis, mood stabilization, and impulse control -Continue Cogentin 1 mg p.o. twice daily -Continue Depakote 750 mg p.o. twice daily - for mood stabilization -Increase gabapentin to 600 mg p.o. 3 times daily-for anxiety and neuropathic pain -Start propranolol 10 mg 3 times daily - for anxiety  -Start lozenge for dry mouth  LABS: Repeat CMP with Valproic Acid level on 11/03/2021  Patient encouraged to participate within the therapeutic milieu CSW to continue working on discharge disposition   Christoper Allegra, MD-PMHNP-BC 11/02/2021, 11:33 AM  Total Time Spent in Direct Patient Care:  I personally spent 30 minutes on the unit in direct patient care. The direct patient care time included face-to-face time with the patient, reviewing the patient's chart, communicating with other professionals,  and coordinating care. Greater than 50% of this time was spent in counseling or coordinating care with the patient regarding goals of hospitalization, psycho-education, and discharge planning needs.   Janine Limbo, MD Psychiatrist

## 2021-11-02 NOTE — B[facility] Counselor (Signed)
CSW provided this patient with information about long term treatment with Lenor Coffin, Recruitment consultant, and [employer]  ? ? ?CSW assisted this patient in calling and completing intake with [facility]. Intake Specialist will call patient back once case is discussed with supervisor.  ? ? ? ? ?Darletta Moll MSW, LCSW ?Clincal Social Worker  ?[facility]  ?

## 2021-11-02 NOTE — Progress Notes (Signed)
Pt was encouraged but didn't attend therapeutic relaxation group. ?

## 2021-11-02 NOTE — B[facility] Group Notes (Signed)
Adult Psychoeducational Group Note ? ?Date:  11/02/2021 ?Time:  8:32 PM ? ?Group Topic/Focus:  ?Wrap-Up Group:   The focus of this group is to help patients review their daily goal of treatment and discuss progress on daily workbooks. ? ?Participation Level:  Active ? ?Participation Quality:  Appropriate and Attentive ? ?Affect:  Appropriate ? ?Cognitive:  Alert and Appropriate ? ?Insight: Appropriate and Good ? ?Engagement in Group:  Engaged ? ?Modes of Intervention:  Discussion and Education ? ?Additional Comments:  Pt attended and participated in wrap up group this evening and rated their day a 6/10. Pt stated that they have been in their room most of the day "meditating". Pt stated that their goal is to "line up the things that they are suppose to do" once they leave [facility]. Pt relayed to writer that they have racing thought and experience thought blocking throughout the day. Pt states they they have told the Dr about these symptoms and that the Dr tells them to alert staff when this happens. Pt would like to learn coping skills for anger. Pt stated that their frustrations and anger are issues that they face in their everyday life. Pt states that they meditate when they have these feelings. Writer encourages pt to alert staff when they have these feelings, in which they agreed to do so.  ? ?Cristi Loron ?11/02/2021, 8:32 PM ?

## 2021-11-03 LAB — COMPREHENSIVE METABOLIC PANEL
ALT: 24 U/L (ref 0–44)
AST: 25 U/L (ref 15–41)
Albumin: 3.5 g/dL (ref 3.5–5.0)
Alkaline Phosphatase: 52 U/L (ref 38–126)
Anion gap: 8 (ref 5–15)
BUN: 8 mg/dL (ref 6–20)
CO2: 24 mmol/L (ref 22–32)
Calcium: 8.8 mg/dL — ABNORMAL LOW (ref 8.9–10.3)
Chloride: 103 mmol/L (ref 98–111)
Creatinine, Ser: 0.69 mg/dL (ref 0.61–1.24)
GFR, Estimated: >60 mL/min (ref >60)
Glucose, Bld: 225 mg/dL — ABNORMAL HIGH (ref 70–99)
Potassium: 3.9 mmol/L (ref 3.5–5.1)
Sodium: 135 mmol/L (ref 135–145)
Total Bilirubin: 0.2 mg/dL — ABNORMAL LOW (ref 0.3–1.2)
Total Protein: 6.7 g/dL (ref 6.5–8.1)

## 2021-11-03 LAB — VALPROIC ACID LEVEL: Valproic Acid Lvl: 84 ug/mL (ref 50.0–100.0)

## 2021-11-03 MED ORDER — BACITRACIN-NEOMYCIN-POLYMYXIN OINTMENT TUBE: TOPICAL_OINTMENT | CUTANEOUS | Status: DC | PRN

## 2021-11-03 MED ORDER — WHITE PETROLATUM EX OINT
TOPICAL_OINTMENT | CUTANEOUS | Status: AC
  Filled 2021-11-03: qty 5

## 2021-11-03 MED ORDER — CHLORPROMAZINE HCL 50 MG PO TABS
50.0000 mg | ORAL_TABLET | Freq: Two times a day (BID) | ORAL | Status: DC
  Administered 2021-11-04 (×2): 50 mg via ORAL
  Filled 2021-11-03 (×3): qty 1

## 2021-11-03 NOTE — Progress Notes (Signed)
[facility] MD Progress Note  11/03/2021 12:59 PM Duane Salazar  MRN:  [id number]  Subjective:   Duane Salazar is a 46 yr old male who presented on 3/1 to [facility] for worsening depression and a Suicide Attempt via OD (Meth), he was admitted to [facility] on 3/3.  PPHx is significant for Schizoaffective Disorder, Anxiety, 1 previous Suicide Attempt (jump off bridge), and ~10 previous hospitalizations (latest [facility] 2017).  Yesterday the psychiatry team made the following recommendations: -Increase Thorazine to 37.5 mg at 8 AM, 37.5 mg at noon, and 75 mg at bedtime -for psychosis, mood stabilization, and impulse control -Continue Cogentin 1 mg p.o. twice daily -Continue Depakote 750 mg p.o. twice daily - for mood stabilization -Increase gabapentin to 600 mg p.o. 3 times daily-for anxiety and neuropathic pain -Start propranolol 10 mg 3 times daily - for anxiety  -Start lozenge for dry mouth  On assessment today, the patient reports that he feels less irritable.  He continues to report having auditory hallucinations, which are less intrusive and less loud, and patient is now unsure if there originating from inside or outside of his head.  He continues report having visual hallucinations, silhouette of a person and a snake, that occurred last night. Denies paranoia. Patient reports that mood is frustrated, but less irritable than yesterday.  Denies feeling sad. Patient reports that sleep is somewhat improved, 4.75 hours last night, with less daytime sedation. Patient reports that anxiety is up and down throughout the day. Patient reports continued to have passive suicidal thoughts, without plan or intent, is able to contract for safety on the unit. Patient reports continuing to have homicidal thoughts.  He denies having homicidal thoughts towards anyone specific on the unit at this time.  He reports that his homicidal thoughts are directed toward someone outside of the psychiatric hospital.  Patient does not comment on  intent or plan of homicidal thoughts, when asked today.  We discussed that the patient's sodium had increased to normal range. We discussed that patient's valproic acid level is in with normal limits.  He otherwise denies having side effects to current psychiatric medications.  Patient reports that bone pain, comes and goes, as it distal joint of right first digit, there is a sore there, but it does not appear to be infected.   Per nursing: Patient is less irritable.  Reports suicidal and homicidal thoughts.  Principal Problem: Schizoaffective disorder ([place]) Diagnosis: Principal Problem:   Schizoaffective disorder ([place])  Total Time spent with patient: 20 minutes  Past Psychiatric History: Charted history with schizoaffective disorder, substance use disorder and suicidal ideation.  He reported previous inpatient admissions.   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   Schizo affective schizophrenia ([place]) 1995   Schizophrenia ([place])    Seizures ([place])    Sleep apnea 1995   on CPAP    Past Surgical History:  Procedure Laterality Date   PITUITARY SURGERY  2005 & 2012   SKIN BIOPSY     Family History:  Family History  Problem Relation Age of Onset   Hypertension Mother    Diabetes Mother    Heart Problems Mother    Cancer Maternal Uncle    Alcoholism Maternal Uncle  Cancer Maternal Grandmother    Heart disease Neg Hx    Family Psychiatric  History:  Social History:  Social History   Substance and Sexual Activity  Alcohol Use Yes   Alcohol/week: 2.0 standard drinks   Types: 2 Cans of beer per week   Comment: 2 cans on workdays; 6-pk on days off; varies     Social History   Substance and Sexual Activity  Drug Use Yes    Types: Cocaine, Marijuana, Heroin   Comment: Reports use 2 grams Cocaine 10/28/21    Social History   Socioeconomic History   Marital status: Single    Spouse name: Not on file   Number of children: 2   Years of education: GED   Highest education level: Not on file  Occupational History   Not on file  Tobacco Use   Smoking status: Every Day    Packs/day: 1.00    Years: 20.00    Pack years: 20.00    Types: Cigarettes   Smokeless tobacco: Never   Tobacco comments:    Smoking .5 ppd  Substance and Sexual Activity   Alcohol use: Yes    Alcohol/week: 2.0 standard drinks    Types: 2 Cans of beer per week    Comment: 2 cans on workdays; 6-pk on days off; varies   Drug use: Yes    Types: Cocaine, Marijuana, Heroin    Comment: Reports use 2 grams Cocaine 10/28/21   Sexual activity: Yes  Other Topics Concern   Not on file  Social History Narrative   Lives with mom.   Incarcerated for 22 months in [place], [place]. From 2014-09/2014   Social Determinants of Health   Financial Resource Strain: Not on file  Food Insecurity: Not on file  Transportation Needs: Not on file  Physical Activity: Not on file  Stress: Not on file  Social Connections: Not on file   Additional Social History:                         Sleep: Fair  Appetite:  Fair  Current Medications: Current Facility-Administered Medications  Medication Dose Route Frequency Provider Last Rate Last Admin   acetaminophen (TYLENOL) tablet 650 mg  650 mg Oral Q6H PRN White, Patrice L, NP   650 mg at 11/03/21 0806   alum & mag hydroxide-simeth (MAALOX/MYLANTA) 200-200-20 MG/5ML suspension 30 mL  30 mL Oral Q4H PRN White, Patrice L, NP       benztropine (COGENTIN) tablet 1 mg  1 mg Oral BID White, Patrice L, NP   1 mg at 11/03/21 7253   celecoxib (CELEBREX) capsule 200 mg  200 mg Oral Daily White, Patrice L, NP   200 mg at 11/03/21 0803   [START ON 11/04/2021] chlorproMAZINE (THORAZINE) tablet 50 mg  50 mg Oral BID  Aceyn Kathol, Ovid Curd, MD       chlorproMAZINE (THORAZINE) tablet 75 mg  75 mg Oral QHS Brandy Kabat, Ovid Curd, MD   75 mg at 11/02/21 2105   divalproex (DEPAKOTE) DR tablet 750 mg  750 mg Oral BID Briant Cedar, MD   750 mg at 11/03/21 0804   feeding supplement (ENSURE ENLIVE / ENSURE PLUS) liquid 237 mL  237 mL Oral Q24H Singleton, Amy E, MD   237 mL at 11/03/21 1041   gabapentin (NEURONTIN) capsule 600 mg  600 mg Oral TID Janine Limbo, MD   600 mg at 11/03/21 1143   hydrOXYzine (ATARAX) tablet 25 mg  25 mg Oral TID PRN Janine Limbo, MD   25 mg at 11/02/21 2107   OLANZapine zydis (ZYPREXA) disintegrating tablet 5 mg  5 mg Oral Q6H PRN Janine Limbo, MD   5 mg at 11/01/21 2130   And   LORazepam (ATIVAN) tablet 1 mg  1 mg Oral Q6H PRN Mandy Peeks, Ovid Curd, MD       And   ziprasidone (GEODON) injection 20 mg  20 mg Intramuscular Q6H PRN [place] Kappes, Ovid Curd, MD       losartan (COZAAR) tablet 25 mg  25 mg Oral Daily White, Patrice L, NP   25 mg at 11/03/21 0830   magnesium hydroxide (MILK OF MAGNESIA) suspension 30 mL  30 mL Oral Daily PRN White, Patrice L, NP       menthol-cetylpyridinium (CEPACOL) lozenge 3 mg  1 lozenge Oral PRN Janine Limbo, MD   3 mg at 11/02/21 2111   metFORMIN (GLUCOPHAGE) tablet 500 mg  500 mg Oral Q breakfast White, Patrice L, NP   500 mg at 11/03/21 1607   neomycin-bacitracin-polymyxin (NEOSPORIN) ointment   Topical PRN Harlow Asa, MD       predniSONE (DELTASONE) tablet 5 mg  5 mg Oral Q breakfast White, Patrice L, NP   5 mg at 11/03/21 0804   propranolol (INDERAL) tablet 10 mg  10 mg Oral TID Janine Limbo, MD   10 mg at 11/03/21 1143   traZODone (DESYREL) tablet 50 mg  50 mg Oral QHS PRN Janine Limbo, MD   50 mg at 11/02/21 2107    Lab Results:  Results for orders placed or performed during the hospital encounter of 10/29/21 (from the past 48 hour(s))  Comprehensive metabolic panel     Status: Abnormal   Collection Time: 11/03/21   7:49 AM  Result Value Ref Range   Sodium 135 135 - 145 mmol/L   Potassium 3.9 3.5 - 5.1 mmol/L   Chloride 103 98 - 111 mmol/L   CO2 24 22 - 32 mmol/L   Glucose, Bld 225 (H) 70 - 99 mg/dL    Comment: Glucose reference range applies only to samples taken after fasting for at least 8 hours.   BUN 8 6 - 20 mg/dL   Creatinine, Ser 0.69 0.61 - 1.24 mg/dL   Calcium 8.8 (L) 8.9 - 10.3 mg/dL   Total Protein 6.7 6.5 - 8.1 g/dL   Albumin 3.5 3.5 - 5.0 g/dL   AST 25 15 - 41 U/L   ALT 24 0 - 44 U/L   Alkaline Phosphatase 52 38 - 126 U/L   Total Bilirubin 0.2 (L) 0.3 - 1.2 mg/dL   GFR, Estimated >60 >60 mL/min    Comment: (NOTE) Calculated using the CKD-EPI Creatinine Equation (2021)    Anion gap 8 5 - 15    Comment: Performed at [facility], [place] [address] [place], [place] [address]  Valproic acid level     Status: None   Collection Time: 11/03/21  7:49 AM  Result Value Ref Range   Valproic Acid Lvl 84 50.0 - 100.0 ug/mL    Comment: Performed at [facility], [place] [address] [place], [place] [address]     Blood Alcohol level:  Lab Results  Component Value Date   [facility] <10 10/28/2021   ETH <10 54/62/7035    Metabolic Disorder Labs: Lab Results  Component Value Date   HGBA1C 6.5 (H) 10/31/2021   MPG 139.85 10/31/2021   No results found for: PROLACTIN  Lab Results  Component Value Date   CHOL 128 10/31/2021   TRIG 90 10/31/2021   HDL 42 10/31/2021   CHOLHDL 3.0 10/31/2021   VLDL 18 10/31/2021   LDLCALC 68 10/31/2021   LDLCALC 81 09/04/2021    Physical Findings: AIMS: Facial and Oral Movements Muscles of Facial Expression: None, normal Lips and Perioral Area: None, normal Jaw: None, normal Tongue: None, normal,Extremity Movements Upper (arms, wrists, hands, fingers): None, normal Lower (legs, knees, ankles, toes): None, normal, Trunk Movements Neck, shoulders, hips: None, normal, Overall Severity Severity of abnormal movements  (highest score from questions above): None, normal Incapacitation due to abnormal movements: None, normal Patient's awareness of abnormal movements (rate only patient's report): No Awareness, Dental Status Current problems with teeth and/or dentures?: No Does patient usually wear dentures?: No Aims score 0 on my examination today, 11/02/2021. CIWA:    COWS:     Musculoskeletal: Strength & Muscle Tone: within normal limits Gait & Station: normal Patient leans: N/A  Psychiatric Specialty Exam:  Presentation  General Appearance: Casual; Appropriate for Environment  Eye Contact:Good  Speech:Normal Rate; Clear and Coherent  Speech Volume:Normal  Handedness:Left   Mood and Affect  Mood: Anxious, irritable  Affect:Congruent Appearing withdrawn  Thought Process  Thought Processes:Coherent; Goal Directed  Descriptions of Associations:Intact  Orientation:Full (Time, Place and Person)  Thought Content:Logical  History of Schizophrenia/Schizoaffective disorder:Yes  Duration of Psychotic Symptoms:Less than six months  Hallucinations:No data recorded Reports AH.  Reports VH.  Ideas of Reference: Denies paranoid thinking  Suicidal Thoughts:No data recorded Reports passive suicidal thoughts, without intent or plan  Homicidal Thoughts:No data recorded Reports having homicidal thoughts.  Denies having homicidal thoughts directed towards anyone on the unit at this time. Reports homicidal thoughts are directed towards someone outside of the psychiatric unit.  Patient does not comment on intent or plan when assessed.   Sensorium  Memory:Immediate Fair; Recent Fair  Judgment: Improving  Insight: Improving   Executive Functions  Concentration:Good  Attention Span:Good  [place]  Language:Good   Psychomotor Activity  Psychomotor Activity:No data recorded Normal.  No tremor or EPS observed.   Assets  Assets:Communication Skills;  Desire for Improvement; Resilience   Sleep  Sleep:No data recorded 4.75 hours last night    Physical Exam: Physical Exam Vitals and nursing note reviewed.  Constitutional:      General: He is not in acute distress.    Appearance: He is not toxic-appearing.  Cardiovascular:     Rate and Rhythm: Normal rate.  Pulmonary:     Effort: Pulmonary effort is normal.  Neurological:     Mental Status: He is alert.  Psychiatric:        Mood and Affect: Mood normal.        Behavior: Behavior normal.        Thought Content: Thought content normal.   Review of Systems  Constitutional:  Negative for chills and fever.  Eyes: Negative.   Cardiovascular: Negative.  Negative for chest pain and palpitations.  Genitourinary: Negative.   Neurological:  Negative for dizziness, tingling, tremors and headaches.  Psychiatric/Behavioral:  Positive for depression, substance abuse and suicidal ideas. Negative for hallucinations. The patient is nervous/anxious. The patient does not have insomnia.   All other systems reviewed and are negative.  Blood pressure (!) 137/97, pulse 88, temperature 98.3 F (36.8 C), temperature source Oral, resp. rate 18, height 6' 3.5" (1.918 m), weight 110.7 kg, SpO2 100 %. Body mass index is 30.1 kg/m.  Treatment Plan Summary: Daily contact with patient to assess and evaluate symptoms and progress in treatment and Medication management Schizoaffective disorder: Major depressive disorder: Substance-induced mood disorder:   Plan: -Increase Thorazine to 50 mg at 8 AM, 50 mg at noon, and 75 mg at bedtime -for psychosis, mood stabilization, and impulse control -Continue Cogentin 1 mg p.o. twice daily -Continue Depakote 750 mg p.o. twice daily - for mood stabilization -Continue gabapentin 600 mg p.o. 3 times daily-for anxiety and neuropathic pain -Continue propranolol 10 mg 3 times daily - for anxiety  -Continue lozenge for dry mouth   Patient encouraged to participate  within the therapeutic milieu CSW to continue working on discharge disposition   Christoper Allegra, MD-PMHNP-BC 11/03/2021, 12:59 PM  Total Time Spent in Direct Patient Care:  I personally spent 30 minutes on the unit in direct patient care. The direct patient care time included face-to-face time with the patient, reviewing the patient's chart, communicating with other professionals, and coordinating care. Greater than 50% of this time was spent in counseling or coordinating care with the patient regarding goals of hospitalization, psycho-education, and discharge planning needs.   Janine Limbo, MD Psychiatrist

## 2021-11-03 NOTE — Progress Notes (Signed)
Pt keeps coming out of the room. Pt is exhibiting bizarre behavior. Pt saying "take me to the store", "can I go outside?". Pt was redirected back to their room.   ?

## 2021-11-03 NOTE — Progress Notes (Addendum)
Pt denies AVH but endorses SI/HI of no one on the unit and verbally agrees to approach staff if these become apparent or before harming themselves/others. Rates depression 5/10. Rates anxiety 3/10. Rates pain 8/10. Pt is pleasant and cooperative. Pt complained of swelling and a hard time moving hands. Needs reinforcement on medical diagnosis causing swelling and contractions. Pt was informed by Probation officer. MD notified of complaints. Pt has been cooperative. Pt has made jokes and laugh. Pt can be hard to understand sometimes with rapid speech but has logical conversation. Scheduled medications administered to pt, per MD orders. RN provided support and encouragement to pt. Q15 min safety checks implemented and continued. Pt safe on the unit. RN will continue to monitor and intervene as needed.  ? 11/03/21 0806  ?Psych Admission Type (Psych Patients Only)  ?Admission Status Voluntary  ?Psychosocial Assessment  ?Patient Complaints Anxiety;Depression;Self-harm thoughts  ?Eye Contact Fair  ?Facial Expression Flat;Anxious  ?Affect Flat;Sad;Anxious  ?Speech Logical/coherent;Slurred;Rapid  ?Interaction Assertive  ?Motor Activity Slow  ?Appearance/Hygiene In scrubs;Unremarkable  ?Behavior Characteristics Cooperative;Appropriate to situation;Calm  ?Mood Anxious;Depressed;Pleasant  ?Thought Process  ?Coherency WDL  ?Content WDL  ?Delusions None reported or observed  ?Perception WDL  ?Hallucination None reported or observed  ?Judgment Limited  ?Confusion None  ?Danger to Self  ?Current suicidal ideation? Passive  ?Self-Injurious Behavior No self-injurious ideation or behavior indicators observed or expressed   ?Agreement Not to Harm Self Yes  ?Description of Agreement Verbal  ?Danger to Others  ?Danger to Others None reported or observed  ? ? ?

## 2021-11-03 NOTE — [facility] Group Notes (Signed)
Adult Psychoeducational Group Note ? ?Date:  11/03/2021 ?Time:  8:51 PM ? ?Group Topic/Focus:  ?Wrap-Up Group:   The focus of this group is to help patients review their daily goal of treatment and discuss progress on daily workbooks. ? ?Participation Level:  Active ? ?Participation Quality:  Appropriate and Attentive ? ?Affect:  Appropriate ? ?Cognitive:  Alert and Appropriate ? ?Insight: Appropriate and Good ? ?Engagement in Group:  Engaged ? ?Modes of Intervention:  Discussion and Education ? ?Additional Comments:  Pt attended and participated in wrap up group this evening and rated their day a 6/10. Pt stated that they are still staying in their room and meditating. Pt goal is to get out of the room more and they are still working on learning coping skills for their anger. Pt states that their future goals are to work on getting into a long-term treatment center and managing their meds. Pt relays to writer that they are having pain in their right hand.    ? ?Cristi Loron ?11/03/2021, 8:51 PM ?

## 2021-11-03 NOTE — Progress Notes (Signed)
?   11/03/21 2000  ?Psych Admission Type (Psych Patients Only)  ?Admission Status Voluntary  ?Psychosocial Assessment  ?Patient Complaints Anxiety;Depression  ?Eye Contact Fair  ?Facial Expression Anxious  ?Affect Blunted  ?Speech Logical/coherent  ?Interaction Assertive  ?Motor Activity Slow  ?Appearance/Hygiene Unremarkable  ?Behavior Characteristics Cooperative  ?Mood Depressed  ?Aggressive Behavior  ?Effect No apparent injury  ?Thought Process  ?Coherency WDL  ?Content WDL  ?Delusions None reported or observed  ?Perception WDL  ?Hallucination None reported or observed  ?Judgment Limited  ?Confusion None  ?Danger to Self  ?Current suicidal ideation? Passive  ?Self-Injurious Behavior Some self-injurious ideation observed or expressed.  No lethal plan expressed   ? ? ?

## 2021-11-04 ENCOUNTER — Encounter (HOSPITAL_COMMUNITY): Payer: Self-pay

## 2021-11-04 LAB — URINALYSIS, COMPLETE (UACMP) WITH MICROSCOPIC
Bacteria, UA: NONE SEEN
Bilirubin Urine: NEGATIVE
Glucose, UA: NEGATIVE mg/dL
Ketones, ur: NEGATIVE mg/dL
Leukocytes,Ua: NEGATIVE
Nitrite: POSITIVE — AB
Protein, ur: NEGATIVE mg/dL
Specific Gravity, Urine: 1.01 (ref 1.005–1.030)
pH: 7 (ref 5.0–8.0)

## 2021-11-04 MED ORDER — BENZTROPINE MESYLATE 0.5 MG PO TABS
0.5000 mg | ORAL_TABLET | Freq: Two times a day (BID) | ORAL | Status: DC
  Administered 2021-11-04 – 2021-11-11 (×14): 0.5 mg via ORAL
  Filled 2021-11-04: qty 14
  Filled 2021-11-04 (×10): qty 1
  Filled 2021-11-04: qty 14
  Filled 2021-11-04 (×7): qty 1

## 2021-11-04 MED ORDER — GABAPENTIN 400 MG PO CAPS
400.0000 mg | ORAL_CAPSULE | Freq: Three times a day (TID) | ORAL | Status: DC
  Administered 2021-11-04 – 2021-11-11 (×21): 400 mg via ORAL
  Filled 2021-11-04 (×3): qty 1
  Filled 2021-11-04 (×2): qty 21
  Filled 2021-11-04 (×16): qty 1
  Filled 2021-11-04: qty 21
  Filled 2021-11-04 (×5): qty 1

## 2021-11-04 MED ORDER — AMLODIPINE BESYLATE 5 MG PO TABS
5.0000 mg | ORAL_TABLET | Freq: Every day | ORAL | Status: DC
Start: 2021-11-04 — End: 2021-11-11
  Administered 2021-11-04 – 2021-11-11 (×8): 5 mg via ORAL
  Filled 2021-11-04: qty 7
  Filled 2021-11-04 (×11): qty 1

## 2021-11-04 MED ORDER — LOSARTAN POTASSIUM 50 MG PO TABS
50.0000 mg | ORAL_TABLET | Freq: Every day | ORAL | Status: DC
  Administered 2021-11-05 – 2021-11-11 (×7): 50 mg via ORAL
  Filled 2021-11-04 (×4): qty 1
  Filled 2021-11-04: qty 7
  Filled 2021-11-04 (×4): qty 1

## 2021-11-04 MED ORDER — TRAZODONE HCL 100 MG PO TABS
100.0000 mg | ORAL_TABLET | Freq: Every evening | ORAL | Status: DC | PRN
  Administered 2021-11-04 – 2021-11-10 (×5): 100 mg via ORAL
  Filled 2021-11-04 (×2): qty 1
  Filled 2021-11-04: qty 7
  Filled 2021-11-04 (×2): qty 1

## 2021-11-04 MED ORDER — CHLORPROMAZINE HCL 25 MG PO TABS
37.5000 mg | ORAL_TABLET | Freq: Two times a day (BID) | ORAL | Status: DC
  Administered 2021-11-05 – 2021-11-11 (×14): 37.5 mg via ORAL
  Filled 2021-11-04 (×6): qty 2
  Filled 2021-11-04 (×2): qty 21
  Filled 2021-11-04 (×6): qty 2
  Filled 2021-11-04: qty 21
  Filled 2021-11-04 (×4): qty 2

## 2021-11-04 MED ORDER — CHLORPROMAZINE HCL 50 MG PO TABS
50.0000 mg | ORAL_TABLET | Freq: Every day | ORAL | Status: DC
  Administered 2021-11-04 – 2021-11-10 (×7): 50 mg via ORAL
  Filled 2021-11-04 (×9): qty 1

## 2021-11-04 NOTE — [facility] Counselor (Signed)
Per Lenor Coffin this patient has been denied due to the amount of medication he is on and their program being unable to manage all of them.  ? ? ? ?Darletta Moll MSW, LCSW ?Clincal Social Worker  ?[facility]  ?

## 2021-11-04 NOTE — BH IP Treatment Plan (Signed)
Interdisciplinary Treatment and Diagnostic Plan Update  11/04/2021 Time of Session: did not attend-update Skye Rodarte MRN: [id number]  Principal Diagnosis: Schizoaffective disorder ([place])  Secondary Diagnoses: Principal Problem:   Schizoaffective disorder ([place])   Current Medications:  Current Facility-Administered Medications  Medication Dose Route Frequency Provider Last Rate Last Admin   acetaminophen (TYLENOL) tablet 650 mg  650 mg Oral Q6H PRN White, Patrice L, NP   650 mg at 11/04/21 0810   alum & mag hydroxide-simeth (MAALOX/MYLANTA) 200-200-20 MG/5ML suspension 30 mL  30 mL Oral Q4H PRN White, Patrice L, NP       amLODipine (NORVASC) tablet 5 mg  5 mg Oral Daily Massengill, Nathan, MD   5 mg at 11/04/21 0950   benztropine (COGENTIN) tablet 0.5 mg  0.5 mg Oral BID Massengill, Ovid Curd, MD       celecoxib (CELEBREX) capsule 200 mg  200 mg Oral Daily White, Patrice L, NP   200 mg at 11/04/21 0809   [START ON 11/05/2021] chlorproMAZINE (THORAZINE) tablet 37.5 mg  37.5 mg Oral BID Massengill, Ovid Curd, MD       chlorproMAZINE (THORAZINE) tablet 50 mg  50 mg Oral QHS Massengill, Ovid Curd, MD       divalproex (DEPAKOTE) DR tablet 750 mg  750 mg Oral BID Briant Cedar, MD   750 mg at 11/04/21 0810   feeding supplement (ENSURE ENLIVE / ENSURE PLUS) liquid 237 mL  237 mL Oral Q24H Nelda Marseille, Amy E, MD   237 mL at 11/04/21 9562   gabapentin (NEURONTIN) capsule 400 mg  400 mg Oral TID Janine Limbo, MD       hydrOXYzine (ATARAX) tablet 25 mg  25 mg Oral TID PRN Janine Limbo, MD   25 mg at 11/03/21 2042   OLANZapine zydis (ZYPREXA) disintegrating tablet 5 mg  5 mg Oral Q6H PRN Janine Limbo, MD   5 mg at 11/01/21 2130   And   LORazepam (ATIVAN) tablet 1 mg  1 mg Oral Q6H PRN Massengill, Ovid Curd, MD       And   ziprasidone (GEODON) injection 20 mg  20 mg Intramuscular Q6H PRN Massengill, Ovid Curd, MD       Derrill Memo ON 11/05/2021] losartan (COZAAR) tablet 50 mg  50 mg Oral Daily  Massengill, Nathan, MD       magnesium hydroxide (MILK OF MAGNESIA) suspension 30 mL  30 mL Oral Daily PRN White, Patrice L, NP       menthol-cetylpyridinium (CEPACOL) lozenge 3 mg  1 lozenge Oral PRN Massengill, Ovid Curd, MD   3 mg at 11/04/21 1150   metFORMIN (GLUCOPHAGE) tablet 500 mg  500 mg Oral Q breakfast White, Patrice L, NP   500 mg at 11/04/21 1308   neomycin-bacitracin-polymyxin (NEOSPORIN) ointment   Topical PRN Harlow Asa, MD       predniSONE (DELTASONE) tablet 5 mg  5 mg Oral Q breakfast White, Patrice L, NP   5 mg at 11/04/21 6578   propranolol (INDERAL) tablet 10 mg  10 mg Oral TID Janine Limbo, MD   10 mg at 11/04/21 1148   traZODone (DESYREL) tablet 50 mg  50 mg Oral QHS PRN Janine Limbo, MD   50 mg at 11/03/21 2042   PTA Medications: Medications Prior to Admission  Medication Sig Dispense Refill Last Dose   benztropine (COGENTIN) 1 MG tablet Take 1 tablet (1 mg total) by mouth 2 (two) times daily. (Patient not taking: Reported on 10/28/2021) 60 tablet 3    celecoxib (CELEBREX) 200  MG capsule Take 1 capsule (200 mg total) by mouth daily. (Patient not taking: Reported on 10/28/2021) 30 capsule 1    ferrous sulfate 325 (65 FE) MG tablet Take 1 tablet by mouth every Monday and Friday (Patient not taking: Reported on 10/28/2021) 100 tablet 1    gabapentin (NEURONTIN) 400 MG capsule Take 1 capsule (400 mg total) by mouth 3 (three) times daily. (Patient not taking: Reported on 10/28/2021) 90 capsule 3    glucose monitoring kit (FREESTYLE) monitoring kit 1 each by Does not apply route daily. Check glucose once in the morning before breakfast and 1 hour after a meal (Patient not taking: Reported on 02/07/2021) 1 each 0    lamoTRIgine (LAMICTAL) 25 MG tablet Take 1 tablet (25 mg total) by mouth daily. (Patient not taking: Reported on 10/28/2021) 120 tablet 2    losartan (COZAAR) 25 MG tablet Take 1 tablet (25 mg total) by mouth daily. (Patient not taking: Reported on 10/28/2021) 30  tablet 3    metFORMIN (GLUCOPHAGE) 500 MG tablet Take 1 tablet (500 mg total) by mouth daily with breakfast. (Patient not taking: Reported on 10/28/2021) 30 tablet 3    predniSONE (DELTASONE) 5 MG tablet Take 1 tablet (5 mg total) by mouth daily with breakfast. (Patient not taking: Reported on 10/28/2021) 30 tablet 0    QUEtiapine (SEROQUEL) 50 MG tablet Take 2 tablets (100 mg total) by mouth at bedtime. (Patient not taking: Reported on 10/28/2021) 30 tablet 3     Patient Stressors: Financial difficulties   Health problems   Marital or family conflict   Substance abuse   Other: "Certain people in the society"    Patient Strengths: Capable of independent living  General fund of knowledge   Treatment Modalities: Medication Management, Group therapy, Case management,  1 to 1 session with clinician, Psychoeducation, Recreational therapy.   Physician Treatment Plan for Primary Diagnosis: Schizoaffective disorder ([place]) Long Term Goal(s): Improvement in symptoms so as ready for discharge   Short Term Goals: Ability to identify changes in lifestyle to reduce recurrence of condition will improve Ability to verbalize feelings will improve Ability to disclose and discuss suicidal ideas Ability to demonstrate self-control will improve Ability to identify and develop effective coping behaviors will improve Ability to identify triggers associated with substance abuse/mental health issues will improve  Medication Management: Evaluate patient's response, side effects, and tolerance of medication regimen.  Therapeutic Interventions: 1 to 1 sessions, Unit Group sessions and Medication administration.  Evaluation of Outcomes: Progressing  Physician Treatment Plan for Secondary Diagnosis: Principal Problem:   Schizoaffective disorder ([place])  Long Term Goal(s): Improvement in symptoms so as ready for discharge   Short Term Goals: Ability to identify changes in lifestyle to reduce recurrence of condition  will improve Ability to verbalize feelings will improve Ability to disclose and discuss suicidal ideas Ability to demonstrate self-control will improve Ability to identify and develop effective coping behaviors will improve Ability to identify triggers associated with substance abuse/mental health issues will improve     Medication Management: Evaluate patient's response, side effects, and tolerance of medication regimen.  Therapeutic Interventions: 1 to 1 sessions, Unit Group sessions and Medication administration.  Evaluation of Outcomes: Progressing   RN Treatment Plan for Primary Diagnosis: Schizoaffective disorder ([place]) Long Term Goal(s): Knowledge of disease and therapeutic regimen to maintain health will improve  Short Term Goals: Ability to remain free from injury will improve, Ability to verbalize frustration and anger appropriately will improve, Ability to demonstrate self-control, Ability  to participate in decision making will improve, Ability to verbalize feelings will improve, Ability to disclose and discuss suicidal ideas, Ability to identify and develop effective coping behaviors will improve, and Compliance with prescribed medications will improve  Medication Management: RN will administer medications as ordered by provider, will assess and evaluate patient's response and provide education to patient for prescribed medication. RN will report any adverse and/or side effects to prescribing provider.  Therapeutic Interventions: 1 on 1 counseling sessions, Psychoeducation, Medication administration, Evaluate responses to treatment, Monitor vital signs and CBGs as ordered, Perform/monitor CIWA, COWS, AIMS and Fall Risk screenings as ordered, Perform wound care treatments as ordered.  Evaluation of Outcomes: Progressing   LCSW Treatment Plan for Primary Diagnosis: Schizoaffective disorder ([place]) Long Term Goal(s): Safe transition to appropriate next level of care at discharge,  Engage patient in therapeutic group addressing interpersonal concerns.  Short Term Goals: Engage patient in aftercare planning with referrals and resources, Increase social support, Increase ability to appropriately verbalize feelings, Increase emotional regulation, Facilitate acceptance of mental health diagnosis and concerns, Facilitate patient progression through stages of change regarding substance use diagnoses and concerns, Identify triggers associated with mental health/substance abuse issues, and Increase skills for wellness and recovery  Therapeutic Interventions: Assess for all discharge needs, 1 to 1 time with Social worker, Explore available resources and support systems, Assess for adequacy in community support network, Educate family and significant other(s) on suicide prevention, Complete Psychosocial Assessment, Interpersonal group therapy.  Evaluation of Outcomes: Progressing   Progress in Treatment: Attending groups: Yes. Participating in groups: Yes. Taking medication as prescribed: Yes. Toleration medication: Yes. Family/Significant other contact made:  patient declined consents Patient understands diagnosis: Yes. Discussing patient identified problems/goals with staff: Yes. Medical problems stabilized or resolved: Yes. Denies suicidal/homicidal ideation: Yes. Issues/concerns per patient self-inventory: No. Other: none  New problem(s) identified: No, Describe:  none  New Short Term/Long Term Goal(s):    medication stabilization, elimination of SI thoughts, development of comprehensive mental wellness plan.    Patient Goals: "stop racing thoughts"  Discharge Plan or Barriers: insurance, appropriate follow up, medication stabilization   Reason for Continuation of Hospitalization: Hallucinations, depression, anxiety  Estimated Length of Stay: 3-5 days   Scribe for Treatment Team: Zachery Conch, LCSW 11/04/2021 3:44 PM

## 2021-11-04 NOTE — Progress Notes (Signed)
Pt coming out his room confused, trying to leave, pt needed redirection, pt appeared to be hallucinating ?

## 2021-11-04 NOTE — Progress Notes (Signed)
Pt was encouraged but didn't attend therapeutic relaxation group. ?

## 2021-11-04 NOTE — Group Note (Signed)
Recreation Therapy Group Note ? ? ?Group Topic:Health and Wellness  ?Group Date: 11/04/2021 ?Start Time: 1000 ?End Time: 4163 ?Facilitators: Victorino Sparrow, LRT,CTRS ?Location: [facility] ? ? ?Goal Area(s) Addresses:  ?Patient will define components of whole wellness. ?Patient will verbalize benefit of whole wellness. ? ?Group Description:  Exercise.  LRT and patients discussed the components of wellness.  LRT then lead patients in a series of stretches.  Patients then took turns leading group in exercises or dance moves of their choosing (except twerking and any other inappropriate dance moves).  The goal was to get at least 30 minutes of movement so patients could release any stress, tension or just feel better.  Patients were encouraged to take breaks and get water as needed.  ? ? ?Affect/Mood: Appropriate ?  ?Participation Level: Moderate ?  ?Participation Quality: Independent ?  ?Behavior: Appropriate ?  ?Speech/Thought Process: Focused ?  ?Insight: Good ?  ?Judgement: Good ?  ?Modes of Intervention: Music and Exercise ?  ?Patient Response to Interventions:  Engaged and Receptive ?  ?Education Outcome: ? Acknowledges education and In group clarification offered   ? ?Clinical Observations/Individualized Feedback: Pt was bright and could be seen smiling at times during activity.  Pt completed the exercises he could and led the group in a few.  Pt eventually sat and just observed the rest of the group.  Pt was appropriate during group session.   ? ? ?Plan: Continue to engage patient in RT group sessions 2-3x/week. ? ? ?Victorino Sparrow, LRT,CTRS ?11/04/2021 12:19 PM ?

## 2021-11-04 NOTE — Progress Notes (Signed)
The focus of this group is to help patients review their daily goal of treatment and discuss progress on daily workbooks. Pt did not attend the evening group. 

## 2021-11-04 NOTE — Progress Notes (Signed)
Pt visible on the unit some this evening, pt continues to be confused at times, pt continues to be paranoid and delusional at times. Pt continues to ask staff to take him to the store and that he has things to do in other parts of this building.  ? ? ? 11/04/21 2100  ?Psych Admission Type (Psych Patients Only)  ?Admission Status Voluntary  ?Psychosocial Assessment  ?Patient Complaints Anxiety;Suspiciousness  ?Eye Contact Fair  ?Facial Expression Anxious  ?Affect Anxious;Preoccupied  ?Speech Logical/coherent  ?Interaction Assertive  ?Motor Activity Slow  ?Appearance/Hygiene Unremarkable  ?Behavior Characteristics Cooperative  ?Mood Preoccupied;Pleasant  ?Aggressive Behavior  ?Effect No apparent injury  ?Thought Process  ?Coherency WDL  ?Content WDL  ?Delusions WDL  ?Perception Hallucinations  ?Hallucination Auditory;Visual  ?Judgment Impaired  ?Confusion Mild  ?Danger to Self  ?Current suicidal ideation? Denies  ?Danger to Others  ?Danger to Others Reported or observed  ?Danger to Others Abnormal  ?Harmful Behavior to others No threats or harm toward other people  ?Destructive Behavior No threats or harm toward property  ? ? ?

## 2021-11-04 NOTE — Progress Notes (Signed)
Pt was encouraged but didn't attend orientation/goals group. ?

## 2021-11-04 NOTE — Progress Notes (Signed)
?   11/04/21 0500  ?Sleep  ?Number of Hours 4.75  ? ? ?

## 2021-11-04 NOTE — Progress Notes (Signed)
[facility] MD Progress Note  11/04/2021 2:36 PM Brianna Esson  MRN:  [id number]  Subjective:   Duane Salazar is a 46 yr old male who presented on 3/1 to [facility] for worsening depression and a Suicide Attempt via OD (Meth), he was admitted to [facility] on 3/3.  PPHx is significant for Schizoaffective Disorder, Anxiety, 1 previous Suicide Attempt (jump off bridge), and ~10 previous hospitalizations (latest [facility] 2017).  Yesterday the psychiatry team made the following recommendations: -Increase Thorazine to 50 mg at 8 AM, 50 mg at noon, and 75 mg at bedtime -for psychosis, mood stabilization, and impulse control -Continue Cogentin 1 mg p.o. twice daily -Continue Depakote 750 mg p.o. twice daily - for mood stabilization -Continue gabapentin 600 mg p.o. 3 times daily-for anxiety and neuropathic pain -Continue propranolol 10 mg 3 times daily - for anxiety  -Continue lozenge for dry mouth  On assessment today, the pt reports that mood is 'i feel good but feel aggressive at the same time'. He reports that sleep is okay but reports being confused during the middle of th e night. Per nursing, this is confirmed, pt was wandering and confused.  Pt reports that Ah persist, but denies CAH. Denies VH. Denies paranoia.  Pt reports having passive suicidal thoughts continue. Pt reports that homicidal thoughts continue, towards someone outside the hospital 'I don't know his name or where he is but I know where I can find him.' Pt denies s/e to current scheduled medications. Pt agreeable with med changes for HTN and other med changes to help with lessen confusion he experienced last night.   He reports no change in thumb pain. He reports [facility] past and will have ortho f/u with this.     Principal Problem: Schizoaffective disorder ([place]) Diagnosis: Principal Problem:   Schizoaffective disorder ([place])  Total Time spent with patient: 20 minutes  Past Psychiatric History:  Charted history with schizoaffective disorder, substance use disorder and suicidal ideation.  He reported previous inpatient admissions.   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   Schizo affective schizophrenia ([place]) 1995   Schizophrenia ([place])    Seizures ([place])    Sleep apnea 1995   on CPAP    Past Surgical History:  Procedure Laterality Date   PITUITARY SURGERY  2005 & 2012   SKIN BIOPSY     Family History:  Family History  Problem Relation Age of Onset   Hypertension Mother    Diabetes Mother    Heart Problems Mother    Cancer Maternal Uncle    Alcoholism Maternal Uncle    Cancer Maternal Grandmother    Heart disease Neg Hx    Family Psychiatric  History:  Social History:  Social History   Substance and Sexual Activity  Alcohol Use Yes   Alcohol/week: 2.0 standard drinks   Types: 2 Cans of beer per week   Comment: 2 cans on workdays; 6-pk on days off; varies     Social History   Substance and Sexual Activity  Drug Use Yes   Types: Cocaine, Marijuana, Heroin   Comment: Reports use 2 grams Cocaine 10/28/21    Social History  Socioeconomic History   Marital status: Single    Spouse name: Not on file   Number of children: 2   Years of education: GED   Highest education level: Not on file  Occupational History   Not on file  Tobacco Use   Smoking status: Every Day    Packs/day: 1.00    Years: 20.00    Pack years: 20.00    Types: Cigarettes   Smokeless tobacco: Never   Tobacco comments:    Smoking .5 ppd  Substance and Sexual Activity   Alcohol use: Yes    Alcohol/week: 2.0 standard drinks    Types: 2 Cans of beer per week    Comment: 2 cans on workdays; 6-pk on days off; varies   Drug use: Yes    Types:  Cocaine, Marijuana, Heroin    Comment: Reports use 2 grams Cocaine 10/28/21   Sexual activity: Yes  Other Topics Concern   Not on file  Social History Narrative   Lives with mom.   Incarcerated for 22 months in [place], [place]. From 2014-09/2014   Social Determinants of Health   Financial Resource Strain: Not on file  Food Insecurity: Not on file  Transportation Needs: Not on file  Physical Activity: Not on file  Stress: Not on file  Social Connections: Not on file   Additional Social History:                         Sleep: Fair confused in middle of night   Appetite:  Fair  Current Medications: Current Facility-Administered Medications  Medication Dose Route Frequency Provider Last Rate Last Admin   acetaminophen (TYLENOL) tablet 650 mg  650 mg Oral Q6H PRN White, Patrice L, NP   650 mg at 11/04/21 0810   alum & mag hydroxide-simeth (MAALOX/MYLANTA) 200-200-20 MG/5ML suspension 30 mL  30 mL Oral Q4H PRN White, Patrice L, NP       amLODipine (NORVASC) tablet 5 mg  5 mg Oral Daily Fenix Ruppe, MD   5 mg at 11/04/21 0950   benztropine (COGENTIN) tablet 0.5 mg  0.5 mg Oral BID Kalijah Westfall, Ovid Curd, MD       celecoxib (CELEBREX) capsule 200 mg  200 mg Oral Daily White, Patrice L, NP   200 mg at 11/04/21 0809   [START ON 11/05/2021] chlorproMAZINE (THORAZINE) tablet 37.5 mg  37.5 mg Oral BID Shatoya Roets, Ovid Curd, MD       chlorproMAZINE (THORAZINE) tablet 50 mg  50 mg Oral QHS Deiondre Harrower, MD       divalproex (DEPAKOTE) DR tablet 750 mg  750 mg Oral BID Briant Cedar, MD   750 mg at 11/04/21 0810   feeding supplement (ENSURE ENLIVE / ENSURE PLUS) liquid 237 mL  237 mL Oral Q24H Nelda Marseille, Amy E, MD   237 mL at 11/04/21 7322   gabapentin (NEURONTIN) capsule 400 mg  400 mg Oral TID Janine Limbo, MD       hydrOXYzine (ATARAX) tablet 25 mg  25 mg Oral TID PRN Janine Limbo, MD   25 mg at 11/03/21 2042   OLANZapine zydis (ZYPREXA) disintegrating tablet 5  mg  5 mg Oral Q6H PRN Armand Preast, Ovid Curd, MD   5 mg at 11/01/21 2130   And   LORazepam (ATIVAN) tablet 1 mg  1 mg Oral Q6H PRN Adea Geisel, Ovid Curd, MD       And   ziprasidone (GEODON) injection 20 mg  20 mg Intramuscular Q6H PRN Stephanee Barcomb,  Ovid Curd, MD       Derrill Memo ON 11/05/2021] losartan (COZAAR) tablet 50 mg  50 mg Oral Daily Zyia Kaneko, MD       magnesium hydroxide (MILK OF MAGNESIA) suspension 30 mL  30 mL Oral Daily PRN White, Patrice L, NP       menthol-cetylpyridinium (CEPACOL) lozenge 3 mg  1 lozenge Oral PRN Livana Yerian, Ovid Curd, MD   3 mg at 11/04/21 1150   metFORMIN (GLUCOPHAGE) tablet 500 mg  500 mg Oral Q breakfast White, Patrice L, NP   500 mg at 11/04/21 1017   neomycin-bacitracin-polymyxin (NEOSPORIN) ointment   Topical PRN Harlow Asa, MD       predniSONE (DELTASONE) tablet 5 mg  5 mg Oral Q breakfast White, Patrice L, NP   5 mg at 11/04/21 5102   propranolol (INDERAL) tablet 10 mg  10 mg Oral TID Janine Limbo, MD   10 mg at 11/04/21 1148   traZODone (DESYREL) tablet 50 mg  50 mg Oral QHS PRN Janine Limbo, MD   50 mg at 11/03/21 2042    Lab Results:  Results for orders placed or performed during the hospital encounter of 10/29/21 (from the past 48 hour(s))  Comprehensive metabolic panel     Status: Abnormal   Collection Time: 11/03/21  7:49 AM  Result Value Ref Range   Sodium 135 135 - 145 mmol/L   Potassium 3.9 3.5 - 5.1 mmol/L   Chloride 103 98 - 111 mmol/L   CO2 24 22 - 32 mmol/L   Glucose, Bld 225 (H) 70 - 99 mg/dL    Comment: Glucose reference range applies only to samples taken after fasting for at least 8 hours.   BUN 8 6 - 20 mg/dL   Creatinine, Ser 0.69 0.61 - 1.24 mg/dL   Calcium 8.8 (L) 8.9 - 10.3 mg/dL   Total Protein 6.7 6.5 - 8.1 g/dL   Albumin 3.5 3.5 - 5.0 g/dL   AST 25 15 - 41 U/L   ALT 24 0 - 44 U/L   Alkaline Phosphatase 52 38 - 126 U/L   Total Bilirubin 0.2 (L) 0.3 - 1.2 mg/dL   GFR, Estimated >60 >60 mL/min    Comment:  (NOTE) Calculated using the CKD-EPI Creatinine Equation (2021)    Anion gap 8 5 - 15    Comment: Performed at [facility], [place] [address], [place], [place] [address]  Valproic acid level     Status: None   Collection Time: 11/03/21  7:49 AM  Result Value Ref Range   Valproic Acid Lvl 84 50.0 - 100.0 ug/mL    Comment: Performed at [facility], [place] [address], [place], [place] [address]     Blood Alcohol level:  Lab Results  Component Value Date   ETH <10 10/28/2021   ETH <10 35/36/1443    Metabolic Disorder Labs: Lab Results  Component Value Date   HGBA1C 6.5 (H) 10/31/2021   MPG 139.85 10/31/2021   No results found for: PROLACTIN Lab Results  Component Value Date   CHOL 128 10/31/2021   TRIG 90 10/31/2021   HDL 42 10/31/2021   CHOLHDL 3.0 10/31/2021   VLDL 18 10/31/2021   LDLCALC 68 10/31/2021   LDLCALC 81 09/04/2021    Physical Findings: AIMS: Facial and Oral Movements Muscles of Facial Expression: None, normal Lips and Perioral Area: None, normal Jaw: None, normal Tongue: None, normal,Extremity Movements Upper (arms, wrists, hands, fingers): None, normal Lower (legs, knees, ankles,  toes): None, normal, Trunk Movements Neck, shoulders, hips: None, normal, Overall Severity Severity of abnormal movements (highest score from questions above): None, normal Incapacitation due to abnormal movements: None, normal Patient's awareness of abnormal movements (rate only patient's report): No Awareness, Dental Status Current problems with teeth and/or dentures?: No Does patient usually wear dentures?: No Aims score 0 on my examination today, 11/02/2021. CIWA:    COWS:     Musculoskeletal: Strength & Muscle Tone: within normal limits Gait & Station: normal Patient leans: N/A  Psychiatric Specialty Exam:  Presentation  General Appearance: Casual; Appropriate for Environment  Eye Contact:Good  Speech:Normal Rate; Clear and  Coherent  Speech Volume:Normal  Handedness:Left   Mood and Affect  Mood: Anxious, irritable  Affect:Congruent Constricted   Thought Process  Thought Processes:Coherent; Goal Directed  Descriptions of Associations:Intact  Orientation:Full (Time, Place and Person)  Thought Content:Logical  History of Schizophrenia/Schizoaffective disorder:Yes  Duration of Psychotic Symptoms:Less than six months  Hallucinations:No data recorded Reports AH.  Denies VH.  Ideas of Reference: Denies paranoid thinking  Suicidal Thoughts:No data recorded Reports passive suicidal thoughts, without intent or plan  Homicidal Thoughts:No data recorded Reports having homicidal thoughts.  Denies having homicidal thoughts directed towards anyone on the unit at this time. Reports homicidal thoughts are directed towards someone outside of the psychiatric unit.    Sensorium  Memory:Immediate Fair; Recent Fair  Judgment: Improving  Insight: Improving   Executive Functions  Concentration:Good  Attention Span:Good  [place]  Language:Good   Psychomotor Activity  Psychomotor Activity:No data recorded Normal.  No tremor or EPS observed.   Assets  Assets:Communication Skills; Desire for Improvement; Resilience   Sleep  Sleep:No data recorded 4.75 hours last night    Physical Exam: Physical Exam Vitals and nursing note reviewed.  Constitutional:      General: He is not in acute distress.    Appearance: He is not toxic-appearing.  Cardiovascular:     Rate and Rhythm: Normal rate.  Pulmonary:     Effort: Pulmonary effort is normal.  Neurological:     Mental Status: He is alert.  Psychiatric:        Behavior: Behavior normal.        Thought Content: Thought content normal.   Review of Systems  Constitutional:  Negative for chills and fever.  Eyes: Negative.   Cardiovascular: Negative.  Negative for chest pain and palpitations.  Genitourinary:  Negative.   Neurological:  Negative for dizziness, tingling, tremors and headaches.  Psychiatric/Behavioral:  Positive for depression, substance abuse and suicidal ideas. Negative for hallucinations. The patient is nervous/anxious. The patient does not have insomnia.   All other systems reviewed and are negative.  Blood pressure (!) 132/91, pulse 86, temperature 97.9 F (36.6 C), temperature source Oral, resp. rate 18, height 6' 3.5" (1.918 m), weight 110.7 kg, SpO2 100 %. Body mass index is 30.1 kg/m. Treatment Plan Summary: Daily contact with patient to assess and evaluate symptoms and progress in treatment and Medication management Schizoaffective disorder: Major depressive disorder: Substance-induced mood disorder:    Plan: -DECREASE Thorazine to 37.5 mg at 8 AM, 37.5 mg at noon, and 50 mg at bedtime -for psychosis, mood stabilization, and impulse control -DECREASE Cogentin 0.5 mg p.o. twice daily -Continue Depakote 750 mg p.o. twice daily - for mood stabilization -DECREASE gabapentin to 400 mg p.o. 3 times daily-for anxiety and neuropathic pain -Continue propranolol 10 mg 3 times daily - for anxiety  -Increase losartan to 50 mg once  daily  -Start amlodipine 5 mg once daily  -Continue lozenge for dry mouth  I ordered labs for delirium work-up due to confusion last night and some confusion during interview this morning.    Patient encouraged to participate within the therapeutic milieu CSW to continue working on discharge disposition   Christoper Allegra, MD-PMHNP-BC 11/04/2021, 2:36 PM  Total Time Spent in Direct Patient Care:  I personally spent 30 minutes on the unit in direct patient care. The direct patient care time included face-to-face time with the patient, reviewing the patient's chart, communicating with other professionals, and coordinating care. Greater than 50% of this time was spent in counseling or coordinating care with the patient regarding goals of  hospitalization, psycho-education, and discharge planning needs.   Janine Limbo, MD Psychiatrist

## 2021-11-04 NOTE — Group Note (Signed)
LCSW Group Therapy Note ? ? ?Group Date: 11/04/2021 ?Start Time: 1300 ?End Time: 1400 ? ? ?Type of Therapy and Topic:  Group Therapy:  Strengths Exploration ? ? ?Participation Level: Minimal ? ?Description of Group: ?This group allows individuals to explore their strengths, learn to use strengths in new ways to improve well-being. Strengths-based interventions involve identifying strengths, understanding how they are used, and learning new ways to apply them. Individuals will identify their strengths, and then explore their roles in different areas of life (relationships, professional life, and personal fulfillment). Individuals will think about ways in which they currently use their strengths, along with new ways they could begin using them. ? ?  ?Therapeutic Goals ?Patient will verbalize two of their strengths ?Patient will identify how their strengths are currently used ?Patient will identify two new ways to apply their strengths  ?Patients will create a plan to apply their strengths in their daily lives  ?  ? ?Summary of Patient Progress:  Patient attended group. Patient dozed off at different periods of group however, he was able to share how he respects the women in his life and admires how strong they are. ? ?  ? ?Therapeutic Modalities ?Cognitive Behavioral Therapy ?Motivational Interviewing ? ? ?Darletta Moll MSW, LCSW ?Clincal Social Worker  ?[facility]  ?

## 2021-11-04 NOTE — Progress Notes (Signed)
Pt denies SI but endorses HI towards others outside of the hospital and AVH. Pt stated that he saw someone in his bed but it went away after a couple seconds. Pt verbally agrees to approach staff if these become apparent or before harming themselves/others. Rates depression 7/10. Rates anxiety 8/10. Rates pain 10/10.  Pt has come up to staff multiple times making comments about how someone told him that they have work for him, that he gave the urine cup to some lady already, that someone knocked on his door to tell him to come downstairs to get a haircut, and or someone knocked on his door to come up to the nurses station. Pt is actively responding to [place]. Pt needs redirection with instructions. Pt took all day to pee in a cup. Scheduled medications administered to pt, per MD orders. RN provided support and encouragement to pt. Q15 min safety checks implemented and continued. Pt safe on the unit. RN will continue to monitor and intervene as needed.  ? 11/04/21 0810  ?Psych Admission Type (Psych Patients Only)  ?Admission Status Voluntary  ?Psychosocial Assessment  ?Patient Complaints Anxiety;Depression  ?Eye Contact Fair  ?Facial Expression Anxious  ?Affect Anxious;Sad  ?Speech Logical/coherent;Rapid  ?Interaction Assertive  ?Motor Activity Slow;Unsteady  ?Appearance/Hygiene Unremarkable  ?Behavior Characteristics Cooperative;Anxious  ?Mood Sad;Pleasant  ?Thought Process  ?Coherency WDL  ?Content WDL  ?Delusions None reported or observed  ?Perception Hallucinations  ?Hallucination Auditory;Visual  ?Judgment Limited  ?Confusion None  ?Danger to Self  ?Current suicidal ideation? Denies  ?Self-Injurious Behavior No self-injurious ideation or behavior indicators observed or expressed   ?Agreement Not to Harm Self Yes  ?Description of Agreement Verbal  ?Danger to Others  ?Danger to Others Reported or observed  ?Danger to Others Abnormal  ?Harmful Behavior to others No threats or harm toward other people  ?Destructive  Behavior No threats or harm toward property  ? ? ?

## 2021-11-05 LAB — VITAMIN D 25 HYDROXY (VIT D DEFICIENCY, FRACTURES): Vit D, 25-Hydroxy: 30.05 ng/mL (ref 30–100)

## 2021-11-05 LAB — COMPREHENSIVE METABOLIC PANEL
ALT: 23 U/L (ref 0–44)
AST: 20 U/L (ref 15–41)
Albumin: 3.5 g/dL (ref 3.5–5.0)
Alkaline Phosphatase: 47 U/L (ref 38–126)
Anion gap: 7 (ref 5–15)
BUN: 11 mg/dL (ref 6–20)
CO2: 25 mmol/L (ref 22–32)
Calcium: 8.6 mg/dL — ABNORMAL LOW (ref 8.9–10.3)
Chloride: 103 mmol/L (ref 98–111)
Creatinine, Ser: 0.54 mg/dL — ABNORMAL LOW (ref 0.61–1.24)
GFR, Estimated: >60 mL/min (ref >60)
Glucose, Bld: 143 mg/dL — ABNORMAL HIGH (ref 70–99)
Potassium: 3.9 mmol/L (ref 3.5–5.1)
Sodium: 135 mmol/L (ref 135–145)
Total Bilirubin: 0.3 mg/dL (ref 0.3–1.2)
Total Protein: 6.6 g/dL (ref 6.5–8.1)

## 2021-11-05 LAB — CBC
HCT: 43.2 % (ref 39.0–52.0)
Hemoglobin: 13.5 g/dL (ref 13.0–17.0)
MCH: 23.9 pg — ABNORMAL LOW (ref 26.0–34.0)
MCHC: 31.3 g/dL (ref 30.0–36.0)
MCV: 76.3 fL — ABNORMAL LOW (ref 80.0–100.0)
Platelets: 258 10*3/uL (ref 150–400)
RBC: 5.66 MIL/uL (ref 4.22–5.81)
RDW: 15.4 % (ref 11.5–15.5)
WBC: 6.4 10*3/uL (ref 4.0–10.5)
nRBC: 0 % (ref 0.0–0.2)

## 2021-11-05 LAB — TSH: TSH: 2.711 u[IU]/mL (ref 0.350–4.500)

## 2021-11-05 LAB — VITAMIN B12: Vitamin B-12: 357 pg/mL (ref 180–914)

## 2021-11-05 LAB — VALPROIC ACID LEVEL: Valproic Acid Lvl: 93 ug/mL (ref 50.0–100.0)

## 2021-11-05 LAB — AMMONIA: Ammonia: 47 umol/L — ABNORMAL HIGH (ref 9–35)

## 2021-11-05 MED ORDER — RISPERIDONE 0.5 MG PO TABS
0.5000 mg | ORAL_TABLET | Freq: Two times a day (BID) | ORAL | Status: DC
  Administered 2021-11-05 – 2021-11-07 (×4): 0.5 mg via ORAL
  Filled 2021-11-05 (×7): qty 1

## 2021-11-05 NOTE — Progress Notes (Signed)
[facility] MD Progress Note  11/05/2021 3:25 PM Duane Salazar  MRN:  [id number]  Subjective:   Duane Salazar is a 46 yr old male who presented on 3/1 to [facility] for worsening depression and a Suicide Attempt via OD (Meth), he was admitted to [facility] on 3/3.  PPHx is significant for Schizoaffective Disorder, Anxiety, 1 previous Suicide Attempt (jump off bridge), and ~10 previous hospitalizations (latest [facility] 2017).  Yesterday the psychiatry team made the following recommendations: -DECREASE Thorazine to 37.5 mg at 8 AM, 37.5 mg at noon, and 50 mg at bedtime -for psychosis, mood stabilization, and impulse control -DECREASE Cogentin 0.5 mg p.o. twice daily -Continue Depakote 750 mg p.o. twice daily - for mood stabilization -DECREASE gabapentin to 400 mg p.o. 3 times daily-for anxiety and neuropathic pain -Continue propranolol 10 mg 3 times daily - for anxiety  -Increase losartan to 50 mg once daily  -Start amlodipine 5 mg once daily  -Continue lozenge for dry mouth  Per nursing: Patient slept 5.5 hours last night.  Hallucinated last night.  Required redirection last night.  His auditory hallucinations.    Per social work: Was good in group.  Denies about his records in [place].  We will look for other options for discharge planning.  On assessment today, patient reports that his mood is "agitated.  It is a lot".  He reports sleep is up and down, and disturbed due to auditory hallucinations.  He again remembers that he was confused last night and hallucinating.  He has some insight into this.  He continues to report having passive suicidal thoughts but is able to contract on the unit for safety.  He continues to have homicidal thoughts, directed towards someone outside of this hospital, and he would not disclose who. He states that auditory hallucinations continued through the day off and on as well.  Denies CAH.  He continues to report having brief visual hallucinations, that he has insight into. He denies  paranoid thought process and does not demonstrate any delusional thought construct. He reports fingers have more mobility. He denies other physical plates. He denies having side effects to current psychiatric medications. He is agreeable starting Risperdal to target auditory hallucinations.  Previous higher dose of Thorazine caused excess sedation and likely contributed the patient's confusion.  Thorazine is helpful for anxiety, mood stability, and reported feelings of agitation and aggression. Blood pressure is better with medication adjustments yesterday.   Principal Problem: Schizoaffective disorder ([place]) Diagnosis: Principal Problem:   Schizoaffective disorder ([place])  Total Time spent with patient: 20 minutes  Past Psychiatric History: Charted history with schizoaffective disorder, substance use disorder and suicidal ideation.  He reported previous inpatient admissions.   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   Schizo affective schizophrenia ([place]) 1995   Schizophrenia ([place])    Seizures ([place])    Sleep apnea 1995   on CPAP    Past Surgical History:  Procedure Laterality Date   PITUITARY SURGERY  2005 & 2012   SKIN BIOPSY     Family History:  Family History  Problem Relation Age of Onset   Hypertension Mother    Diabetes Mother    Heart Problems Mother    Cancer  Maternal Uncle    Alcoholism Maternal Uncle    Cancer Maternal Grandmother    Heart disease Neg Hx    Family Psychiatric  History:  Social History:  Social History   Substance and Sexual Activity  Alcohol Use Yes   Alcohol/week: 2.0 standard drinks   Types: 2 Cans of beer per week   Comment: 2 cans on workdays; 6-pk on days off; varies     Social History    Substance and Sexual Activity  Drug Use Yes   Types: Cocaine, Marijuana, Heroin   Comment: Reports use 2 grams Cocaine 10/28/21    Social History   Socioeconomic History   Marital status: Single    Spouse name: Not on file   Number of children: 2   Years of education: GED   Highest education level: Not on file  Occupational History   Not on file  Tobacco Use   Smoking status: Every Day    Packs/day: 1.00    Years: 20.00    Pack years: 20.00    Types: Cigarettes   Smokeless tobacco: Never   Tobacco comments:    Smoking .5 ppd  Substance and Sexual Activity   Alcohol use: Yes    Alcohol/week: 2.0 standard drinks    Types: 2 Cans of beer per week    Comment: 2 cans on workdays; 6-pk on days off; varies   Drug use: Yes    Types: Cocaine, Marijuana, Heroin    Comment: Reports use 2 grams Cocaine 10/28/21   Sexual activity: Yes  Other Topics Concern   Not on file  Social History Narrative   Lives with mom.   Incarcerated for 22 months in [place], [place]. From 2014-09/2014   Social Determinants of Health   Financial Resource Strain: Not on file  Food Insecurity: Not on file  Transportation Needs: Not on file  Physical Activity: Not on file  Stress: Not on file  Social Connections: Not on file   Additional Social History:                         Sleep: Fair confused in middle of night, hallucinating last night,  Appetite:  Fair  Current Medications: Current Facility-Administered Medications  Medication Dose Route Frequency Provider Last Rate Last Admin   acetaminophen (TYLENOL) tablet 650 mg  650 mg Oral Q6H PRN White, Patrice L, NP   650 mg at 11/05/21 0846   alum & mag hydroxide-simeth (MAALOX/MYLANTA) 200-200-20 MG/5ML suspension 30 mL  30 mL Oral Q4H PRN White, Patrice L, NP       amLODipine (NORVASC) tablet 5 mg  5 mg Oral Daily Rhylan Kagel, MD   5 mg at 11/05/21 0845   benztropine (COGENTIN) tablet 0.5 mg  0.5 mg Oral BID Taquita Demby, Ovid Curd, MD    0.5 mg at 11/05/21 0843   celecoxib (CELEBREX) capsule 200 mg  200 mg Oral Daily White, Patrice L, NP   200 mg at 11/05/21 0844   chlorproMAZINE (THORAZINE) tablet 37.5 mg  37.5 mg Oral BID Shelley Pooley, Ovid Curd, MD   37.5 mg at 11/05/21 1302   chlorproMAZINE (THORAZINE) tablet 50 mg  50 mg Oral QHS Taelar Gronewold, Ovid Curd, MD   50 mg at 11/04/21 2120   divalproex (DEPAKOTE) DR tablet 750 mg  750 mg Oral BID Briant Cedar, MD   750 mg at 11/05/21 0844   feeding supplement (ENSURE ENLIVE / ENSURE PLUS) liquid 237 mL  237 mL Oral Q24H Nelda Marseille,  Amy E, MD   237 mL at 11/05/21 1004   gabapentin (NEURONTIN) capsule 400 mg  400 mg Oral TID Janine Limbo, MD   400 mg at 11/05/21 1301   hydrOXYzine (ATARAX) tablet 25 mg  25 mg Oral TID PRN Janine Limbo, MD   25 mg at 11/04/21 2120   OLANZapine zydis (ZYPREXA) disintegrating tablet 5 mg  5 mg Oral Q6H PRN Janine Limbo, MD   5 mg at 11/01/21 2130   And   LORazepam (ATIVAN) tablet 1 mg  1 mg Oral Q6H PRN Kaiea Esselman, Ovid Curd, MD       And   ziprasidone (GEODON) injection 20 mg  20 mg Intramuscular Q6H PRN Khaya Theissen, Ovid Curd, MD       losartan (COZAAR) tablet 50 mg  50 mg Oral Daily [place] Decoteau, Ovid Curd, MD   50 mg at 11/05/21 0843   magnesium hydroxide (MILK OF MAGNESIA) suspension 30 mL  30 mL Oral Daily PRN White, Patrice L, NP       menthol-cetylpyridinium (CEPACOL) lozenge 3 mg  1 lozenge Oral PRN Karlina Suares, Ovid Curd, MD   3 mg at 11/04/21 1150   metFORMIN (GLUCOPHAGE) tablet 500 mg  500 mg Oral Q breakfast White, Patrice L, NP   500 mg at 11/05/21 3295   neomycin-bacitracin-polymyxin (NEOSPORIN) ointment   Topical PRN Harlow Asa, MD       predniSONE (DELTASONE) tablet 5 mg  5 mg Oral Q breakfast White, Patrice L, NP   5 mg at 11/05/21 0845   propranolol (INDERAL) tablet 10 mg  10 mg Oral TID Janine Limbo, MD   10 mg at 11/05/21 1302   risperiDONE (RISPERDAL) tablet 0.5 mg  0.5 mg Oral Q12H Aiana Nordquist, Ovid Curd, MD        traZODone (DESYREL) tablet 100 mg  100 mg Oral QHS PRN Prescilla Sours, PA-C   100 mg at 11/04/21 2120    Lab Results:  Results for orders placed or performed during the hospital encounter of 10/29/21 (from the past 48 hour(s))  Urinalysis, Complete w Microscopic Urine, Clean Catch     Status: Abnormal   Collection Time: 11/04/21  5:15 PM  Result Value Ref Range   Color, Urine STRAW (A) YELLOW   APPearance CLEAR CLEAR   Specific Gravity, Urine 1.010 1.005 - 1.030   pH 7.0 5.0 - 8.0   Glucose, UA NEGATIVE NEGATIVE mg/dL   Hgb urine dipstick SMALL (A) NEGATIVE   Bilirubin Urine NEGATIVE NEGATIVE   Ketones, ur NEGATIVE NEGATIVE mg/dL   Protein, ur NEGATIVE NEGATIVE mg/dL   Nitrite POSITIVE (A) NEGATIVE   Leukocytes,Ua NEGATIVE NEGATIVE   RBC / HPF 0-5 0 - 5 RBC/hpf   Bacteria, UA NONE SEEN NONE SEEN    Comment: Performed at [facility], [place] [address], [place], [place] [address]  CBC     Status: Abnormal   Collection Time: 11/05/21  6:41 AM  Result Value Ref Range   WBC 6.4 4.0 - 10.5 K/uL   RBC 5.66 4.22 - 5.81 MIL/uL   Hemoglobin 13.5 13.0 - 17.0 g/dL   HCT 43.2 39.0 - 52.0 %   MCV 76.3 (L) 80.0 - 100.0 fL   MCH 23.9 (L) 26.0 - 34.0 pg   MCHC 31.3 30.0 - 36.0 g/dL   RDW 15.4 11.5 - 15.5 %   Platelets 258 150 - 400 K/uL   nRBC 0.0 0.0 - 0.2 %    Comment: Performed at [facility], [place] [address]  [address] [place], [place] [address]  Valproic acid level     Status: None   Collection Time: 11/05/21  6:41 AM  Result Value Ref Range   Valproic Acid Lvl 93 50.0 - 100.0 ug/mL    Comment: Performed at [facility], [place] [address] [place], [place] [address]  Comprehensive metabolic panel     Status: Abnormal   Collection Time: 11/05/21  6:41 AM  Result Value Ref Range   Sodium 135 135 - 145 mmol/L   Potassium 3.9 3.5 - 5.1 mmol/L   Chloride 103 98 - 111 mmol/L   CO2 25 22 - 32 mmol/L   Glucose, Bld 143 (H) 70 - 99 mg/dL     Comment: Glucose reference range applies only to samples taken after fasting for at least 8 hours.   BUN 11 6 - 20 mg/dL   Creatinine, Ser 0.54 (L) 0.61 - 1.24 mg/dL   Calcium 8.6 (L) 8.9 - 10.3 mg/dL   Total Protein 6.6 6.5 - 8.1 g/dL   Albumin 3.5 3.5 - 5.0 g/dL   AST 20 15 - 41 U/L   ALT 23 0 - 44 U/L   Alkaline Phosphatase 47 38 - 126 U/L   Total Bilirubin 0.3 0.3 - 1.2 mg/dL   GFR, Estimated >60 >60 mL/min    Comment: (NOTE) Calculated using the CKD-EPI Creatinine Equation (2021)    Anion gap 7 5 - 15    Comment: Performed at [facility], [place] [address] [place], [place] [address]  VITAMIN D 25 Hydroxy (Vit-D Deficiency, Fractures)     Status: None   Collection Time: 11/05/21  6:41 AM  Result Value Ref Range   Vit D, 25-Hydroxy 30.05 30 - 100 ng/mL    Comment: (NOTE) Vitamin D deficiency has been defined by the Singer practice guideline as a level of serum 25-OH  vitamin D less than 20 ng/mL (1,2). The Endocrine Society went on to  further define vitamin D insufficiency as a level between 21 and 29  ng/mL (2).  1. IOM (Institute of Medicine). 2010. Dietary reference intakes for  calcium and D. [place]: The [employer]. 2. Holick MF, Binkley [place], Bischoff-Ferrari HA, et al. Evaluation,  treatment, and prevention of vitamin D deficiency: an Endocrine  Society clinical practice guideline, JCEM. 2011 Jul; 96(7): 1911-30.  Performed at [facility], [place] [address], [place], [place] [address]   Vitamin B12     Status: None   Collection Time: 11/05/21  6:41 AM  Result Value Ref Range   Vitamin B-12 357 180 - 914 pg/mL    Comment: (NOTE) This assay is not validated for testing neonatal or myeloproliferative syndrome specimens for Vitamin B12 levels. Performed at [facility], [place] [address], [place], [place] [address]   TSH     Status: None   Collection Time: 11/05/21   6:41 AM  Result Value Ref Range   TSH 2.711 0.350 - 4.500 uIU/mL    Comment: Performed by a 3rd Generation assay with a functional sensitivity of <=0.01 uIU/mL. Performed at [facility], [place] [address], [place], [place] [address]   Ammonia     Status: Abnormal   Collection Time: 11/05/21  6:41 AM  Result Value Ref Range   Ammonia 47 (H) 9 - 35 umol/L    Comment: Performed at [facility], [place] [address], [place], [place] [address]  Blood Alcohol level:  Lab Results  Component Value Date   ETH <10 10/28/2021   ETH <10 90/30/0923    Metabolic Disorder Labs: Lab Results  Component Value Date   HGBA1C 6.5 (H) 10/31/2021   MPG 139.85 10/31/2021   No results found for: PROLACTIN Lab Results  Component Value Date   CHOL 128 10/31/2021   TRIG 90 10/31/2021   HDL 42 10/31/2021   CHOLHDL 3.0 10/31/2021   VLDL 18 10/31/2021   LDLCALC 68 10/31/2021   LDLCALC 81 09/04/2021    Physical Findings: AIMS: Facial and Oral Movements Muscles of Facial Expression: None, normal Lips and Perioral Area: None, normal Jaw: None, normal Tongue: None, normal,Extremity Movements Upper (arms, wrists, hands, fingers): None, normal Lower (legs, knees, ankles, toes): None, normal, Trunk Movements Neck, shoulders, hips: None, normal, Overall Severity Severity of abnormal movements (highest score from questions above): None, normal Incapacitation due to abnormal movements: None, normal Patient's awareness of abnormal movements (rate only patient's report): No Awareness, Dental Status Current problems with teeth and/or dentures?: No Does patient usually wear dentures?: No Aims score 0 on my examination today, 11/02/2021. CIWA:    COWS:     Musculoskeletal: Strength & Muscle Tone: within normal limits Gait & Station: normal Patient leans: N/A  Psychiatric Specialty Exam:  Presentation  General Appearance: Casual; Appropriate for Environment  Eye  Contact:Good  Speech:Normal Rate; Clear and Coherent  Speech Volume:Normal  Handedness:Left   Mood and Affect  Mood: Anxious, irritable  Affect:Congruent Constricted   Thought Process  Thought Processes:Coherent; Goal Directed  Descriptions of Associations:Intact  Orientation:Full (Time, Place and Person)  Thought Content:Logical  History of Schizophrenia/Schizoaffective disorder:Yes  Duration of Psychotic Symptoms:Less than six months  Hallucinations:No data recorded Reports AH.  Denies VH.  Ideas of Reference: Denies paranoid thinking  Suicidal Thoughts:No data recorded Reports passive suicidal thoughts, without intent or plan  Homicidal Thoughts:No data recorded Reports having homicidal thoughts.  Denies having homicidal thoughts directed towards anyone on the unit at this time. Reports homicidal thoughts are directed towards someone outside of the psychiatric unit.    Sensorium  Memory:Immediate Fair; Recent Fair  Judgment: Improving  Insight: Improving   Executive Functions  Concentration:Good  Attention Span:Good  [place]  Language:Good   Psychomotor Activity  Psychomotor Activity:No data recorded Normal.  No tremor or EPS observed.   Assets  Assets:Communication Skills; Desire for Improvement; Resilience   Sleep  Sleep:No data recorded 5.5 hours last night    Physical Exam: Physical Exam Vitals and nursing note reviewed.  Constitutional:      General: He is not in acute distress.    Appearance: He is not toxic-appearing.  Cardiovascular:     Rate and Rhythm: Normal rate.  Pulmonary:     Effort: Pulmonary effort is normal.  Neurological:     Mental Status: He is alert.     Gait: Gait abnormal.  Psychiatric:        Behavior: Behavior normal.   Review of Systems  Constitutional:  Negative for chills and fever.  Eyes: Negative.   Cardiovascular: Negative.  Negative for chest pain and  palpitations.  Genitourinary: Negative.   Neurological:  Negative for dizziness, tingling, tremors and headaches.  Psychiatric/Behavioral:  Positive for depression, hallucinations, substance abuse and suicidal ideas. The patient is nervous/anxious. The patient does not have insomnia.   All other systems reviewed and are negative.  Blood pressure 106/73, pulse (!) 105, temperature 98.2 F (36.8 C), temperature source  Oral, resp. rate 18, height 6' 3.5" (1.918 m), weight 110.7 kg, SpO2 100 %. Body mass index is 30.1 kg/m.  Treatment Plan Summary: Daily contact with patient to assess and evaluate symptoms and progress in treatment and Medication management  Assessment: Schizoaffective disorder Major depressive disorder Substance-induced mood disorder    Plan: -Continue Thorazine 37.5 mg at 8 AM, 37.5 mg at noon, and 50 mg at bedtime -for psychosis, mood stabilization, and impulse control -Continue Cogentin 0.5 mg p.o. twice daily -Continue Depakote 750 mg p.o. twice daily - for mood stabilization -Continue gabapentin to 400 mg p.o. 3 times daily-for anxiety and neuropathic pain -Start Risperdal 0.5 mg twice daily-for hallucinations.  We did not increase Thorazine, because it was at a higher dose previously, and this caused excessive sedation and probably contributed to confusion in the middle of the night. -Continue propranolol 10 mg 3 times daily - for anxiety  -Increase losartan to 50 mg once daily  -Continue amlodipine 5 mg once daily  -Continue lozenge for dry mouth  Labs: Delirium work-up negative from labs ordered yesterday.  Patient encouraged to participate within the therapeutic milieu  CSW to continue working on discharge disposition   Christoper Allegra, MD-PMHNP-BC 11/05/2021, 3:25 PM  Total Time Spent in Direct Patient Care:  I personally spent 30 minutes on the unit in direct patient care. The direct patient care time included face-to-face time with the patient,  reviewing the patient's chart, communicating with other professionals, and coordinating care. Greater than 50% of this time was spent in counseling or coordinating care with the patient regarding goals of hospitalization, psycho-education, and discharge planning needs.   Janine Limbo, MD Psychiatrist

## 2021-11-05 NOTE — Group Note (Signed)
Recreation Therapy Group Note ? ? ?Group Topic:Communication ?Group Date: 11/05/2021 ?Start Time: 0951 ?End Time: 1030 ?Facilitators: Victorino Sparrow, LRT,CTRS ?Location: [place] ? ? ?Goal Area(s) Addresses:  ?Patient will effectively listen to complete activity.  ?Patient will identify communication skills used to make activity successful.  ?Patient will identify how skills used during activity can be used to reach post d/c goals.  ?  ?Group Description: Geometric Drawings.  Three volunteers from the peer group will be shown an abstract picture with a particular arrangement of geometrical shapes.  Each round, one 'speaker' will describe the pattern, as accurately as possible without revealing the image to the group.  The remaining group members will listen and draw the picture to reflect how it is described to them. Patients with the role of 'listener' cannot ask clarifying questions but, may request that the speaker repeat a direction. Once the drawings are complete, the presenter will show the rest of the group the picture and compare how close each person came to drawing the picture. LRT will facilitate a post-activity discussion regarding effective communication and the importance of planning, listening, and asking for clarification in daily interactions with others. ? ? ?Affect/Mood: N/A ?  ?Participation Level: Did not attend ?  ? ?Clinical Observations/Individualized Feedback:   ?  ? ?Plan: Continue to engage patient in RT group sessions 2-3x/week. ? ? ?Victorino Sparrow, LRT,CTRS ?11/05/2021 11:05 AM ?

## 2021-11-05 NOTE — Progress Notes (Addendum)
Pt A & O X4. Denies SI, HI and pain when assessed. Endorsed +AVH last night and earlier this shift. Per pt "I kept hearing voices last night in my room. I saw the door open and close multiple times. That's why I'm sitting in the dayroom now this morning because of the voices". Pt refused to attend scheduled groups, walked out of dayroom for each scheduled group. Observed with intermittent irritability when demands for gatorade /gingerale are not met. Rates his depression 10/10 and anxiety 8/10 related to his +AVH. Pt remains medications complaint, denies adverse drug reactions. Safety checks maintained at Q 15 minutes intervals without incident. Verbal education provided on current medication regimen and effects monitored. Support and encouragement offered. Pt remains safe in milieu. Tolerates all  PO intake well. Off unit with peers for meals, returned without issues.  ?

## 2021-11-05 NOTE — Progress Notes (Signed)
Pt was encouraged but didn't attend orientation/goals group. ?

## 2021-11-05 NOTE — Progress Notes (Signed)
Pt visible on the unit some this evening, pt stated AVH- better, but he was still having them bad earlier today ? ? 11/05/21 2100  ?Psych Admission Type (Psych Patients Only)  ?Admission Status Voluntary  ?Psychosocial Assessment  ?Patient Complaints Suspiciousness  ?Eye Contact Fair  ?Facial Expression Anxious  ?Affect Appropriate to circumstance  ?Speech Logical/coherent  ?Interaction Assertive  ?Motor Activity Slow  ?Appearance/Hygiene Unremarkable  ?Behavior Characteristics Cooperative  ?Mood Preoccupied  ?Aggressive Behavior  ?Effect No apparent injury  ?Thought Process  ?Coherency WDL  ?Content WDL  ?Delusions WDL  ?Perception Hallucinations  ?Hallucination Auditory;Visual  ?Judgment Impaired  ?Confusion None  ?Danger to Self  ?Current suicidal ideation? Denies  ?Self-Injurious Behavior No self-injurious ideation or behavior indicators observed or expressed   ?Agreement Not to Harm Self Yes  ?Description of Agreement Verbal contract for safety  ?Danger to Others  ?Danger to Others None reported or observed  ? ? ? ? ?

## 2021-11-05 NOTE — Progress Notes (Signed)
?   11/05/21 0545  ?Sleep  ?Number of Hours 5.5  ? ? ?

## 2021-11-06 MED ORDER — LACTULOSE 10 GM/15ML PO SOLN
20.0000 g | Freq: Three times a day (TID) | ORAL | Status: DC
  Administered 2021-11-06 – 2021-11-07 (×4): 20 g via ORAL
  Filled 2021-11-06 (×12): qty 30

## 2021-11-06 NOTE — Progress Notes (Signed)
?   11/06/21 0530  ?Sleep  ?Number of Hours 7  ? ? ?

## 2021-11-06 NOTE — Progress Notes (Signed)
Adult Psychoeducational Group Note ? ?Date:  11/06/2021 ?Time:  12:02 PM ? ?Group Topic/Focus:  ?Goals Group:   The focus of this group is to help patients establish daily goals to achieve during treatment and discuss how the patient can incorporate goal setting into their daily lives to aide in recovery. ? ?Participation Level:  Active ? ?Participation Quality:  Appropriate ? ?Affect:  Appropriate ? ?Cognitive:  Appropriate ? ?Insight: Appropriate ? ?Engagement in Group:  Engaged ? ?Modes of Intervention:  Discussion ? ?Additional Comments:  Patient attended morning orientation/goal's group and participated.  ? ?Thayer Inabinet W Molinda Bailiff ?[id number], 12:02 PM ?

## 2021-11-06 NOTE — BHH Counselor (Signed)
Patient has confirmed plans for discharge to [facility] with tentative discharge date of Monday, 11/09/21. ? ? ?Patient requested CSW call his mother to provide information around discharge. CSW attempted to reach patients mother, Prudencio Velazco [phone]). CSW was unable to leave a voicemail. ? ? ? ? ?Darletta Moll MSW, LCSW ?Clincal Social Worker  ?[facility]  ?

## 2021-11-06 NOTE — Progress Notes (Signed)
[facility] MD Progress Note  11/06/2021 6:49 PM Duane Salazar  MRN:  [id number]  Subjective:   Duane Salazar is a 46 yr old male who presented on 3/1 to [facility] for worsening depression and a Suicide Attempt via OD (Meth), he was admitted to [facility] on 3/3.  PPHx is significant for Schizoaffective Disorder, Anxiety, 1 previous Suicide Attempt (jump off bridge), and ~10 previous hospitalizations (latest [facility] 2017).  Yesterday the psychiatry team made the following recommendations: -Continue Thorazine 37.5 mg at 8 AM, 37.5 mg at noon, and 50 mg at bedtime -for psychosis, mood stabilization, and impulse control -Continue Cogentin 0.5 mg p.o. twice daily -Continue Depakote 750 mg p.o. twice daily - for mood stabilization -Continue gabapentin to 400 mg p.o. 3 times daily-for anxiety and neuropathic pain -Start Risperdal 0.5 mg twice daily-for hallucinations.  We did not increase Thorazine, because it was at a higher dose previously, and this caused excessive sedation and probably contributed to confusion in the middle of the night. -Continue propranolol 10 mg 3 times daily - for anxiety  -Increase losartan to 50 mg once daily  -Continue amlodipine 5 mg once daily  -Continue lozenge for dry mouth  Per nursing: slept 7 hours last night. Less AH.    On assessment today, patient reports that his mood is "agitated." Reports that anxiety is up and down depending on his environment.  Pt reports that sleep is better. Denies feeling confused last night.  Reports that Ah are less. Denies CAH.  Reports that paranoid thoughts are off and on, feeling less suspicious than yesterday. Reports that suicidal thoughts 'are at a minimum' and are passive.  Reports that HI is the same, denies intent to harm anyone this unit but has intent and plan to harm unnamed person outside the hospital.   He reports fingers have more mobility. He denies other physical complaints.  He denies having side effects to current psychiatric  medications.   Principal Problem: Schizoaffective disorder ([place]) Diagnosis: Principal Problem:   Schizoaffective disorder ([place])  Total Time spent with patient: 20 minutes  Past Psychiatric History: Charted history with schizoaffective disorder, substance use disorder and suicidal ideation.  He reported previous inpatient admissions.   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   Schizo affective schizophrenia ([place]) 1995   Schizophrenia ([place])    Seizures ([place])    Sleep apnea 1995   on CPAP    Past Surgical History:  Procedure Laterality Date   PITUITARY SURGERY  2005 & 2012   SKIN BIOPSY     Family History:  Family History  Problem Relation Age of Onset   Hypertension Mother    Diabetes Mother    Heart Problems Mother    Cancer Maternal Uncle    Alcoholism Maternal Uncle    Cancer Maternal Grandmother    Heart disease Neg Hx    Family Psychiatric  History:  Social History:  Social History   Substance and Sexual Activity  Alcohol Use Yes   Alcohol/week: 2.0 standard drinks   Types: 2 Cans of beer per week   Comment: 2 cans on workdays; 6-pk on days off; varies     Social History   Substance and Sexual Activity  Drug Use Yes   Types: Cocaine, Marijuana, Heroin   Comment: Reports use 2 grams Cocaine 10/28/21    Social History   Socioeconomic History   Marital status: Single    Spouse name: Not on file   Number of children: 2   Years of education: GED   Highest education level: Not on file  Occupational History   Not on file  Tobacco Use   Smoking status: Every Day    Packs/day: 1.00    Years: 20.00    Pack years: 20.00    Types: Cigarettes   Smokeless tobacco: Never   Tobacco comments:    Smoking .5 ppd   Substance and Sexual Activity   Alcohol use: Yes    Alcohol/week: 2.0 standard drinks    Types: 2 Cans of beer per week    Comment: 2 cans on workdays; 6-pk on days off; varies   Drug use: Yes    Types: Cocaine, Marijuana, Heroin    Comment: Reports use 2 grams Cocaine 10/28/21   Sexual activity: Yes  Other Topics Concern   Not on file  Social History Narrative   Lives with mom.   Incarcerated for 22 months in [place], [place]. From 2014-09/2014   Social Determinants of Health   Financial Resource Strain: Not on file  Food Insecurity: Not on file  Transportation Needs: Not on file  Physical Activity: Not on file  Stress: Not on file  Social Connections: Not on file   Additional Social History:                         Sleep: Fair improving   Appetite:  Fair  Current Medications: Current Facility-Administered Medications  Medication Dose Route Frequency Provider Last Rate Last Admin   acetaminophen (TYLENOL) tablet 650 mg  650 mg Oral Q6H PRN White, Patrice L, NP   650 mg at 11/05/21 0846   alum & mag hydroxide-simeth (MAALOX/MYLANTA) 200-200-20 MG/5ML suspension 30 mL  30 mL Oral Q4H PRN White, Patrice L, NP       amLODipine (NORVASC) tablet 5 mg  5 mg Oral Daily Hoover Grewe, MD   5 mg at 11/06/21 0827   benztropine (COGENTIN) tablet 0.5 mg  0.5 mg Oral BID Lucia Harm, Ovid Curd, MD   0.5 mg at 11/06/21 1716   celecoxib (CELEBREX) capsule 200 mg  200 mg Oral Daily White, Patrice L, NP   200 mg at 11/06/21 0827   chlorproMAZINE (THORAZINE) tablet 37.5 mg  37.5 mg Oral BID Charlize Hathaway, Ovid Curd, MD   37.5 mg at 11/06/21 1208   chlorproMAZINE (THORAZINE) tablet 50 mg  50 mg Oral QHS Lurlean Kernen, Ovid Curd, MD   50 mg at 11/05/21 2051   divalproex (DEPAKOTE) DR tablet 750 mg  750 mg Oral BID Briant Cedar, MD   750 mg at 11/06/21 0829   feeding supplement (ENSURE ENLIVE / ENSURE PLUS) liquid 237 mL  237 mL Oral Q24H Singleton, Amy E, MD   237 mL at 11/06/21 1011    gabapentin (NEURONTIN) capsule 400 mg  400 mg Oral TID Janine Limbo, MD   400 mg at 11/06/21 1716   hydrOXYzine (ATARAX) tablet 25 mg  25 mg Oral TID PRN Janine Limbo, MD   25 mg at 11/05/21 2051   lactulose (CHRONULAC) 10 GM/15ML solution 20 g  20 g Oral Q8H Mahsa Hanser, MD   20 g at 11/06/21 1417   OLANZapine zydis (ZYPREXA) disintegrating tablet 5 mg  5  mg Oral Q6H PRN Janine Limbo, MD   5 mg at 11/01/21 2130   And   LORazepam (ATIVAN) tablet 1 mg  1 mg Oral Q6H PRN Odessia Asleson, Ovid Curd, MD       And   ziprasidone (GEODON) injection 20 mg  20 mg Intramuscular Q6H PRN Martesha Niedermeier, Ovid Curd, MD       losartan (COZAAR) tablet 50 mg  50 mg Oral Daily Estus Krakowski, Ovid Curd, MD   50 mg at 11/06/21 0827   magnesium hydroxide (MILK OF MAGNESIA) suspension 30 mL  30 mL Oral Daily PRN White, Patrice L, NP       menthol-cetylpyridinium (CEPACOL) lozenge 3 mg  1 lozenge Oral PRN Janine Limbo, MD   3 mg at 11/04/21 1150   metFORMIN (GLUCOPHAGE) tablet 500 mg  500 mg Oral Q breakfast White, Patrice L, NP   500 mg at 11/06/21 1027   neomycin-bacitracin-polymyxin (NEOSPORIN) ointment   Topical PRN Harlow Asa, MD       predniSONE (DELTASONE) tablet 5 mg  5 mg Oral Q breakfast White, Patrice L, NP   5 mg at 11/06/21 0827   propranolol (INDERAL) tablet 10 mg  10 mg Oral TID Janine Limbo, MD   10 mg at 11/06/21 1716   risperiDONE (RISPERDAL) tablet 0.5 mg  0.5 mg Oral Q12H Arabel Barcenas, Ovid Curd, MD   0.5 mg at 11/06/21 0827   traZODone (DESYREL) tablet 100 mg  100 mg Oral QHS PRN Prescilla Sours, PA-C   100 mg at 11/05/21 2051    Lab Results:  Results for orders placed or performed during the hospital encounter of 10/29/21 (from the past 48 hour(s))  CBC     Status: Abnormal   Collection Time: 11/05/21  6:41 AM  Result Value Ref Range   WBC 6.4 4.0 - 10.5 K/uL   RBC 5.66 4.22 - 5.81 MIL/uL   Hemoglobin 13.5 13.0 - 17.0 g/dL   HCT 43.2 39.0 - 52.0 %   MCV 76.3 (L) 80.0 -  100.0 fL   MCH 23.9 (L) 26.0 - 34.0 pg   MCHC 31.3 30.0 - 36.0 g/dL   RDW 15.4 11.5 - 15.5 %   Platelets 258 150 - 400 K/uL   nRBC 0.0 0.0 - 0.2 %    Comment: Performed at [facility], [place] [address], [place], [place] [address]  Valproic acid level     Status: None   Collection Time: 11/05/21  6:41 AM  Result Value Ref Range   Valproic Acid Lvl 93 50.0 - 100.0 ug/mL    Comment: Performed at [facility], [place] [address], [place], [place] [address]  Comprehensive metabolic panel     Status: Abnormal   Collection Time: 11/05/21  6:41 AM  Result Value Ref Range   Sodium 135 135 - 145 mmol/L   Potassium 3.9 3.5 - 5.1 mmol/L   Chloride 103 98 - 111 mmol/L   CO2 25 22 - 32 mmol/L   Glucose, Bld 143 (H) 70 - 99 mg/dL    Comment: Glucose reference range applies only to samples taken after fasting for at least 8 hours.   BUN 11 6 - 20 mg/dL   Creatinine, Ser 0.54 (L) 0.61 - 1.24 mg/dL   Calcium 8.6 (L) 8.9 - 10.3 mg/dL   Total Protein 6.6 6.5 - 8.1 g/dL   Albumin 3.5 3.5 - 5.0 g/dL   AST 20 15 - 41 U/L   ALT 23 0 - 44 U/L  Alkaline Phosphatase 47 38 - 126 U/L   Total Bilirubin 0.3 0.3 - 1.2 mg/dL   GFR, Estimated >60 >60 mL/min    Comment: (NOTE) Calculated using the CKD-EPI Creatinine Equation (2021)    Anion gap 7 5 - 15    Comment: Performed at [facility], [place] [address] [place], [place] [address]  VITAMIN D 25 Hydroxy (Vit-D Deficiency, Fractures)     Status: None   Collection Time: 11/05/21  6:41 AM  Result Value Ref Range   Vit D, 25-Hydroxy 30.05 30 - 100 ng/mL    Comment: (NOTE) Vitamin D deficiency has been defined by the [place] practice guideline as a level of serum 25-OH  vitamin D less than 20 ng/mL (1,2). The Endocrine Society went on to  further define vitamin D insufficiency as a level between 21 and 29  ng/mL (2).  1. IOM (Institute of Medicine). 2010. Dietary  reference intakes for  calcium and D. [place]: The [employer]. 2. Holick MF, [place], Bischoff-Ferrari HA, et al. Evaluation,  treatment, and prevention of vitamin D deficiency: an Endocrine  Society clinical practice guideline, JCEM. 2011 Jul; 96(7): 1911-30.  Performed at [facility], [place] [address] [place], [place] [address]   Vitamin B12     Status: None   Collection Time: 11/05/21  6:41 AM  Result Value Ref Range   Vitamin B-12 357 180 - 914 pg/mL    Comment: (NOTE) This assay is not validated for testing neonatal or myeloproliferative syndrome specimens for Vitamin B12 levels. Performed at [facility], [place] [address] [place], [place] [address]   TSH     Status: None   Collection Time: 11/05/21  6:41 AM  Result Value Ref Range   TSH 2.711 0.350 - 4.500 uIU/mL    Comment: Performed by a 3rd Generation assay with a functional sensitivity of <=0.01 uIU/mL. Performed at [facility], [place] [address] [place], [place] [address]   Ammonia     Status: Abnormal   Collection Time: 11/05/21  6:41 AM  Result Value Ref Range   Ammonia 47 (H) 9 - 35 umol/L    Comment: Performed at [facility], [place] [address] [place], [place] [address]     Blood Alcohol level:  Lab Results  Component Value Date   ETH <10 10/28/2021   ETH <10 51/09/5850    Metabolic Disorder Labs: Lab Results  Component Value Date   HGBA1C 6.5 (H) 10/31/2021   MPG 139.85 10/31/2021   No results found for: PROLACTIN Lab Results  Component Value Date   CHOL 128 10/31/2021   TRIG 90 10/31/2021   HDL 42 10/31/2021   CHOLHDL 3.0 10/31/2021   VLDL 18 10/31/2021   LDLCALC 68 10/31/2021   LDLCALC 81 09/04/2021    Physical Findings: AIMS: Facial and Oral Movements Muscles of Facial Expression: None, normal Lips and Perioral Area: None, normal Jaw: None, normal Tongue: None, normal,Extremity Movements Upper (arms,  wrists, hands, fingers): None, normal Lower (legs, knees, ankles, toes): None, normal, Trunk Movements Neck, shoulders, hips: None, normal, Overall Severity Severity of abnormal movements (highest score from questions above): None, normal Incapacitation due to abnormal movements: None, normal Patient's awareness of abnormal movements (rate only patient's report): No Awareness, Dental Status Current problems with teeth and/or dentures?: No Does patient usually wear dentures?: No Aims score 0 on my examination today, 11/02/2021. CIWA:  COWS:     Musculoskeletal: Strength & Muscle Tone: within normal limits Gait & Station: normal Patient leans: N/A  Psychiatric Specialty Exam:  Presentation  General Appearance: Casual; Appropriate for Environment  Eye Contact:Good  Speech:Normal Rate; Clear and Coherent  Speech Volume:Normal  Handedness:Left   Mood and Affect  Mood: Anxious, irritable  Affect:Congruent Constricted   Thought Process  Thought Processes:Coherent; Goal Directed  Descriptions of Associations:Intact  Orientation:Full (Time, Place and Person)  Thought Content:Logical  History of Schizophrenia/Schizoaffective disorder:Yes  Duration of Psychotic Symptoms:Less than six months  Hallucinations:No data recorded Reports AH.  Denies VH.  Ideas of Reference: Denies paranoid thinking  Suicidal Thoughts:No data recorded Reports passive suicidal thoughts, without intent or plan  Homicidal Thoughts:No data recorded Reports having homicidal thoughts.  Denies having homicidal thoughts directed towards anyone on the unit at this time. Reports homicidal thoughts are directed towards someone outside of the psychiatric unit.    Sensorium  Memory:Immediate Fair; Recent Fair  Judgment: Improving  Insight: Improving   Executive Functions  Concentration:Good  Attention Span:Good  [place]  Language:Good   Psychomotor Activity   Psychomotor Activity:No data recorded Normal.  No tremor or EPS observed.   Assets  Assets:Communication Skills; Desire for Improvement; Resilience   Sleep  Sleep:No data recorded 5.5 hours last night    Physical Exam: Physical Exam Vitals and nursing note reviewed.  Constitutional:      General: He is not in acute distress.    Appearance: He is not toxic-appearing.  Cardiovascular:     Rate and Rhythm: Normal rate.  Pulmonary:     Effort: Pulmonary effort is normal.  Neurological:     Mental Status: He is alert.     Gait: Gait abnormal.  Psychiatric:        Behavior: Behavior normal.   Review of Systems  Constitutional:  Negative for chills and fever.  Eyes: Negative.   Cardiovascular: Negative.  Negative for chest pain and palpitations.  Genitourinary: Negative.   Neurological:  Negative for dizziness, tingling, tremors and headaches.  Psychiatric/Behavioral:  Positive for hallucinations, substance abuse and suicidal ideas. The patient is nervous/anxious. The patient does not have insomnia.   All other systems reviewed and are negative.  Blood pressure 130/82, pulse 92, temperature 98 F (36.7 C), temperature source Oral, resp. rate 16, height 6' 3.5" (1.918 m), weight 110.7 kg, SpO2 100 %. Body mass index is 30.1 kg/m.  Treatment Plan Summary: Daily contact with patient to assess and evaluate symptoms and progress in treatment and Medication management  Assessment: Schizoaffective disorder Major depressive disorder Substance-induced mood disorder    Plan: -Continue Thorazine 37.5 mg at 8 AM, 37.5 mg at noon, and 50 mg at bedtime -for psychosis, mood stabilization, and impulse control -Continue Cogentin 0.5 mg p.o. twice daily -Continue Depakote 750 mg p.o. twice daily - for mood stabilization -Continue gabapentin to 400 mg p.o. 3 times daily-for anxiety and neuropathic pain -Continue Risperdal 0.5 mg twice daily-for hallucinations.  We did not increase  Thorazine, because it was at a higher dose previously, and this caused excessive sedation and probably contributed to confusion in the middle of the night. -Continue propranolol 10 mg 3 times daily - for anxiety  -Continue losartan to 50 mg once daily  -Continue amlodipine 5 mg once daily  -Continue lozenge for dry mouth -Start lactulose for elevated ammonia level.     Patient encouraged to participate within the therapeutic milieu  CSW to continue working on  discharge disposition   Christoper Allegra, MD  11/06/2021, 6:49 PM  Total Time Spent in Direct Patient Care:  I personally spent 30 minutes on the unit in direct patient care. The direct patient care time included face-to-face time with the patient, reviewing the patient's chart, communicating with other professionals, and coordinating care. Greater than 50% of this time was spent in counseling or coordinating care with the patient regarding goals of hospitalization, psycho-education, and discharge planning needs.   Janine Limbo, MD Psychiatrist

## 2021-11-06 NOTE — Group Note (Signed)
Recreation Therapy Group Note ? ? ?Group Topic:Team Building  ?Group Date: 11/06/2021 ?Start Time: 1000 ?End Time: 5784 ?Facilitators: Victorino Sparrow, LRT,CTRS ?Location: Organ ? ? ?Goal Area(s) Addresses:  ?Patient will effectively work with peer towards shared goal.  ?Patient will identify skills used to make activity successful.  ?Patient will identify how skills used during activity can be used to reach post d/c goals.  ?  ?Group Description:  Straw Bridge. In teams of 3-5, patients were given 15 plastic drinking straws and an equal length of masking tape. Using the materials provided, patients were instructed to build a free standing bridge-like structure to suspend an everyday item (ex: puzzle box) off of the floor or table surface. All materials were required to be used by the team in their design. LRT facilitated post-activity discussion reviewing team process. Patients were encouraged to reflect how the skills used in this activity can be generalized to daily life post discharge.  ? ? ?Affect/Mood: Appropriate ?  ?Participation Level: Engaged ?  ?Participation Quality: Independent ?  ?Behavior: Appropriate ?  ?Speech/Thought Process: Focused ?  ?Insight: Good ?  ?Judgement: Good ?  ?Modes of Intervention: Team-building ?  ?Patient Response to Interventions:  Engaged ?  ?Education Outcome: ? Acknowledges education and In group clarification offered   ? ?Clinical Observations/Individualized Feedback: Pt was very engaged with peers in how to construct bridge.  Pt was bright and seemed to focused on completing activity.  During discussion, pt expressed the activity shows you "not to be afraid to ask for help".    ? ? ?Plan: Continue to engage patient in RT group sessions 2-3x/week. ? ? ?Victorino Sparrow, LRT,CTRS ?11/06/2021 11:06 AM ?

## 2021-11-06 NOTE — BHH Counselor (Signed)
Pt was denied at [facility] due to being on more than 2 psychotropic medications.  ? ? ? ?Darletta Moll MSW, LCSW ?Clincal Social Worker  ?[facility]  ?

## 2021-11-06 NOTE — Progress Notes (Signed)
Pt visible on the unit, pt stated AH better today ? ? ? 11/06/21 2100  ?Psych Admission Type (Psych Patients Only)  ?Admission Status Voluntary  ?Psychosocial Assessment  ?Patient Complaints Suspiciousness  ?Eye Contact Fair  ?Facial Expression Blank  ?Affect Appropriate to circumstance  ?Speech Logical/coherent  ?Interaction Assertive  ?Motor Activity Slow  ?Appearance/Hygiene Unremarkable  ?Behavior Characteristics Cooperative  ?Mood Preoccupied  ?Aggressive Behavior  ?Effect No apparent injury  ?Thought Process  ?Coherency WDL  ?Content WDL  ?Delusions WDL  ?Perception Hallucinations  ?Hallucination Auditory  ?Judgment Impaired  ?Confusion None  ?Danger to Self  ?Current suicidal ideation? Denies  ?Danger to Others  ?Danger to Others None reported or observed  ?Danger to Others Abnormal  ?Harmful Behavior to others No threats or harm toward other people  ?Destructive Behavior No threats or harm toward property  ? ? ?

## 2021-11-06 NOTE — Group Note (Signed)
LCSW Group Therapy Note ? ? ?Group Date: 11/06/2021 ?Start Time: 1100 ?End Time: 1200 ? ?Topic: Emotions ? ? ?Due to limited staffing and high volume of discharges and new admissions group was not held. Patient was provided therapeutic worksheets and asked to meet with CSW as needed. ? ? ? ?Vassie Moselle, LCSW ?11/06/2021  11:36 AM   ?

## 2021-11-06 NOTE — BHH Group Notes (Signed)
Spirituality group facilitated by Kathrynn Humble, [place].  ? ?Group Description: Group focused on topic of hope. Patients participated in facilitated discussion around topic, connecting with one another around experiences and definitions for hope. Group members engaged with visual explorer photos, reflecting on what hope looks like for them today. Group engaged in discussion around how their definitions of hope are present today in hospital.  ? ?Modalities: Psycho-social ed, [place], Narrative, MI  ? ?Patient Progress: Duane Salazar attended group and engaged in the conversation.  He asked some questions that were off topic, but he was able to be redirected and was respectful of peers. ? ?[employer], Bcc ?Pager, [phone] ? ?

## 2021-11-07 DIAGNOSIS — G5691 Unspecified mononeuropathy of right upper limb: Secondary | ICD-10-CM | POA: Diagnosis present

## 2021-11-07 DIAGNOSIS — R29898 Other symptoms and signs involving the musculoskeletal system: Secondary | ICD-10-CM | POA: Diagnosis present

## 2021-11-07 MED ORDER — RISPERIDONE 1 MG PO TABS
1.0000 mg | ORAL_TABLET | Freq: Two times a day (BID) | ORAL | Status: DC
  Administered 2021-11-07 – 2021-11-09 (×4): 1 mg via ORAL
  Filled 2021-11-07 (×8): qty 1

## 2021-11-07 NOTE — Progress Notes (Signed)
?   11/07/21 0837  ?Psych Admission Type (Psych Patients Only)  ?Admission Status Voluntary  ?Psychosocial Assessment  ?Patient Complaints Suspiciousness  ?Eye Contact Fair  ?Facial Expression Flat  ?Affect Appropriate to circumstance  ?Speech Logical/coherent  ?Interaction Assertive  ?Motor Activity Slow  ?Appearance/Hygiene Unremarkable  ?Behavior Characteristics Cooperative  ?Mood Preoccupied  ?Thought Process  ?Coherency WDL  ?Content WDL  ?Delusions None reported or observed  ?Perception Hallucinations  ?Hallucination Auditory;Visual  ?Judgment Impaired  ?Confusion None  ?Danger to Self  ?Current suicidal ideation? Denies  ?Self-Injurious Behavior No self-injurious ideation or behavior indicators observed or expressed   ?Agreement Not to Harm Self Yes  ?Description of Agreement verbally contracts for safety  ?Danger to Others  ?Danger to Others None reported or observed  ?Danger to Others Abnormal  ?Harmful Behavior to others No threats or harm toward other people  ?Destructive Behavior No threats or harm toward property  ? ? ?

## 2021-11-07 NOTE — Progress Notes (Signed)
D: Pt alert and oriented. Pt rates depression 0/10, hopelessness 0/10, and anxiety 4/10.  Pt reports energy level as good and concentration as being good. Pt reports sleep last night as being good. Pt denies experiencing any pain at this time. Pt endorses SI thoughts but no plan. Contracts for safety. Denies experiencing /HI, or AVH at this time.  ? ?A: Scheduled medications administered to pt, per MD orders. Support and encouragement provided. Frequent verbal contact made. Routine safety checks conducted q15 minutes.  ? ?R: No adverse drug reactions noted. Pt verbally contracts for safety at this time. Pt complaint with medications and treatment plan. Pt interacts well with others on the unit. Pt remains safe at this time. Will continue to monitor.    ?

## 2021-11-07 NOTE — Plan of Care (Signed)
?  Problem: Activity: ?Goal: Interest or engagement in activities will improve ?Outcome: Progressing ?  ?Problem: Coping: ?Goal: Ability to verbalize frustrations and anger appropriately will improve ?Outcome: Progressing ?  ?Problem: Coping: ?Goal: Ability to demonstrate self-control will improve ?Outcome: Progressing ?  ?Problem: Safety: ?Goal: Periods of time without injury will increase ?Outcome: Progressing ?  ?Problem: Coping: ?Goal: Coping ability will improve ?Outcome: Progressing ?  ?Problem: Nutritional: ?Goal: Ability to achieve adequate nutritional intake will improve ?Outcome: Progressing ?  ?

## 2021-11-07 NOTE — [facility] Group Notes (Signed)
.  Psychoeducational Group Note ? ?Date: 11/07/2021 ?Time: 0900-1000 ? ? ? ?Goal Setting  ? ?Purpose of Group: This group helps to provide patients with the steps of setting a goal that is specific, measurable, attainable, realistic and time specific. A discussion on how we keep ourselves stuck with negative self talk. Homework given for Patients to write 25 positive attributes about themselves. ? ? ? ?Participation Level:  Active ? ?Participation Quality:  Appropriate ? ?Affect:  Appropriate ? ?Cognitive:  Appropriate ? ?Insight:  Improving ? ?Engagement in Group:  Engaged ? ?Additional Comments:  Pt rates his energy at a 4/10. Was able to write 15 positives about himself and share it with the group. Praised by the group and this Probation officer. ? ?Bryson Dames A ?

## 2021-11-07 NOTE — Group Note (Signed)
[facility] LCSW Group Therapy Note ? ? ?Group Date: 11/07/2021 ?Start Time: 1115 ?End Time: 1200 ? ?Type of Therapy/Topic:  Group Therapy:  Feelings about Diagnosis ? ?Participation Level:  Active  ? ?Mood: restless ? ? ?Description of Group:   ? This group will allow patients to explore their thoughts and feelings about diagnoses they have received. Patients will be guided to explore their level of understanding and acceptance of these diagnoses. Facilitator will encourage patients to process their thoughts and feelings about the reactions of others to their diagnosis, and will guide patients in identifying ways to discuss their diagnosis with significant others in their lives. This group will be process-oriented, with patients participating in exploration of their own experiences as well as giving and receiving support and challenge from other group members. ? ? ?Therapeutic Goals: ?1. Patient will demonstrate understanding of diagnosis as evidence by identifying two or more symptoms of the disorder:  ?2. Patient will be able to express two feelings regarding the diagnosis ?3. Patient will demonstrate ability to communicate their needs through discussion and/or role plays ? ?Summary of Patient Progress: ? ? ? ?The patient stated he does not feel he is getting the proper care in this hospital.  He stated he told the doctor he is "a monster."  He admitted that the last time he was in the hospital, he lied in order to get discharged.  He feels good about getting his schizophrenia diagnosis "if it's the right one." ? ? ? ?Therapeutic Modalities:   ?Cognitive Behavioral Therapy ?Brief Therapy ?Feelings Identification  ? ? ?Maretta Los, LCSW ?

## 2021-11-07 NOTE — Progress Notes (Signed)
[facility] MD Progress Note  11/07/2021 2:18 PM Duane Salazar  MRN:  [id number]  Subjective:   Duane Salazar is a 46 yr old male who presented on 3/1 to [facility] for worsening depression and a Suicide Attempt via OD (Meth), he was admitted to [facility] on 3/3.  PPHx is significant for Schizoaffective Disorder, Anxiety, 1 previous Suicide Attempt (jump off bridge), and ~10 previous hospitalizations (latest [facility] 2017).    Per nursing: slept 7 hours last night. Less AH.    On assessment today, he is worried about his right hand having reduced dexterity and feeling. 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 He also wants his medications adjusted to help with some of the hallucinations during the day. He was seeing his ex GF sleeping in the bed and he is worried that he will commit a crime and go to jail if he hallucinates. He allows me to look at his hand. He has some muscle loss at the thumb that suggest he has denervation of the hand. We talk about this and that he would need to see a neurologist for studies, maybe an EMG, as an outpatient. I do tell him that I can adjust the medications for the hallucinations, but it may take trial and error. We can check in tomorrow and see how well that worked, and he agrees we can work together on that part. No suicidal or homicidal ideation today. He is eating well. He is sleeping well.    Principal Problem: Schizoaffective disorder ([place]) Diagnosis: Principal Problem:   Schizoaffective disorder ([place]) Active Problems:   Schizoaffective disorder, depressive type (HCC)   Tobacco use disorder   Polysubstance dependence (HCC)   Right hand weakness   Neuropathy of hand, right  Total Time spent with patient: 20 minutes  Past Psychiatric History: Charted history with schizoaffective disorder, substance use disorder and suicidal ideation.  He reported previous inpatient admissions.   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   Schizo affective schizophrenia ([place]) 1995   Schizophrenia ([place])    Seizures ([place])    Sleep apnea 1995   on CPAP    Past Surgical History:  Procedure Laterality Date   PITUITARY SURGERY  2005 & 2012   SKIN BIOPSY     Family History:  Family History  Problem Relation Age of Onset   Hypertension Mother    Diabetes Mother    Heart Problems Mother    Cancer Maternal Uncle    Alcoholism Maternal Uncle    Cancer Maternal Grandmother    Heart disease Neg Hx    Family Psychiatric  History:  Social History:  Social History   Substance and Sexual Activity  Alcohol Use Yes   Alcohol/week: 2.0 standard drinks   Types: 2 Cans of beer per week   Comment: 2 cans on workdays; 6-pk on days off; varies     Social History   Substance and Sexual Activity  Drug Use Yes   Types: Cocaine, Marijuana, Heroin   Comment: Reports use 2 grams Cocaine 10/28/21    Social History   Socioeconomic History   Marital status: Single  Spouse name: Not on file   Number of children: 2   Years of education: GED   Highest education level: Not on file  Occupational History   Not on file  Tobacco Use   Smoking status: Every Day    Packs/day: 1.00    Years: 20.00    Pack years: 20.00    Types: Cigarettes   Smokeless tobacco: Never   Tobacco comments:    Smoking .5 ppd  Substance and Sexual Activity   Alcohol use: Yes    Alcohol/week: 2.0 standard drinks    Types: 2 Cans of beer per week    Comment: 2 cans on workdays; 6-pk on days off; varies   Drug use: Yes    Types: Cocaine, Marijuana, Heroin    Comment: Reports use 2 grams Cocaine 10/28/21   Sexual activity: Yes  Other Topics Concern   Not on file  Social History Narrative   Lives with mom.    Incarcerated for 22 months in [place], [place]. From 2014-09/2014   Social Determinants of Health   Financial Resource Strain: Not on file  Food Insecurity: Not on file  Transportation Needs: Not on file  Physical Activity: Not on file  Stress: Not on file  Social Connections: Not on file   Additional Social History:                         Sleep: Fair improving   Appetite:  Fair  Current Medications: Current Facility-Administered Medications  Medication Dose Route Frequency Provider Last Rate Last Admin   acetaminophen (TYLENOL) tablet 650 mg  650 mg Oral Q6H PRN White, Patrice L, NP   650 mg at 11/05/21 0846   alum & mag hydroxide-simeth (MAALOX/MYLANTA) 200-200-20 MG/5ML suspension 30 mL  30 mL Oral Q4H PRN White, Patrice L, NP       amLODipine (NORVASC) tablet 5 mg  5 mg Oral Daily Massengill, Nathan, MD   5 mg at 11/07/21 4656   benztropine (COGENTIN) tablet 0.5 mg  0.5 mg Oral BID Massengill, Ovid Curd, MD   0.5 mg at 11/07/21 0751   celecoxib (CELEBREX) capsule 200 mg  200 mg Oral Daily White, Patrice L, NP   200 mg at 11/07/21 0750   chlorproMAZINE (THORAZINE) tablet 37.5 mg  37.5 mg Oral BID Massengill, Ovid Curd, MD   37.5 mg at 11/07/21 1310   chlorproMAZINE (THORAZINE) tablet 50 mg  50 mg Oral QHS Massengill, Ovid Curd, MD   50 mg at 11/06/21 2039   divalproex (DEPAKOTE) DR tablet 750 mg  750 mg Oral BID Briant Cedar, MD   750 mg at 11/07/21 0750   feeding supplement (ENSURE ENLIVE / ENSURE PLUS) liquid 237 mL  237 mL Oral Q24H Nelda Marseille, Amy E, MD   237 mL at 11/07/21 0916   gabapentin (NEURONTIN) capsule 400 mg  400 mg Oral TID Janine Limbo, MD   400 mg at 11/07/21 1311   hydrOXYzine (ATARAX) tablet 25 mg  25 mg Oral TID PRN Janine Limbo, MD   25 mg at 11/06/21 2039   lactulose (CHRONULAC) 10 GM/15ML solution 20 g  20 g Oral Q8H Massengill, Nathan, MD   20 g at 11/07/21 1311   OLANZapine zydis (ZYPREXA) disintegrating tablet 5 mg  5 mg Oral Q6H PRN  Massengill, Ovid Curd, MD   5 mg at 11/01/21 2130   And   LORazepam (ATIVAN) tablet 1 mg  1 mg Oral Q6H PRN Janine Limbo, MD  And   ziprasidone (GEODON) injection 20 mg  20 mg Intramuscular Q6H PRN Massengill, Nathan, MD       losartan (COZAAR) tablet 50 mg  50 mg Oral Daily Massengill, Nathan, MD   50 mg at 11/07/21 0751   magnesium hydroxide (MILK OF MAGNESIA) suspension 30 mL  30 mL Oral Daily PRN White, Patrice L, NP       menthol-cetylpyridinium (CEPACOL) lozenge 3 mg  1 lozenge Oral PRN Massengill, Ovid Curd, MD   3 mg at 11/04/21 1150   metFORMIN (GLUCOPHAGE) tablet 500 mg  500 mg Oral Q breakfast White, Patrice L, NP   500 mg at 11/07/21 0750   neomycin-bacitracin-polymyxin (NEOSPORIN) ointment   Topical PRN Harlow Asa, MD       predniSONE (DELTASONE) tablet 5 mg  5 mg Oral Q breakfast White, Patrice L, NP   5 mg at 11/07/21 0751   propranolol (INDERAL) tablet 10 mg  10 mg Oral TID Janine Limbo, MD   10 mg at 11/07/21 1309   risperiDONE (RISPERDAL) tablet 1 mg  1 mg Oral Q12H Kennedee Kitzmiller, Jackie Plum, MD       traZODone (DESYREL) tablet 100 mg  100 mg Oral QHS PRN Margorie John W, PA-C   100 mg at 11/06/21 2039    Lab Results:  No results found for this or any previous visit (from the past 48 hour(s)).    Blood Alcohol level:  Lab Results  Component Value Date   ETH <10 10/28/2021   ETH <10 25/85/2778    Metabolic Disorder Labs: Lab Results  Component Value Date   HGBA1C 6.5 (H) 10/31/2021   MPG 139.85 10/31/2021   No results found for: PROLACTIN Lab Results  Component Value Date   CHOL 128 10/31/2021   TRIG 90 10/31/2021   HDL 42 10/31/2021   CHOLHDL 3.0 10/31/2021   VLDL 18 10/31/2021   LDLCALC 68 10/31/2021   LDLCALC 81 09/04/2021    Physical Findings: AIMS: Facial and Oral Movements Muscles of Facial Expression: None, normal Lips and Perioral Area: None, normal Jaw: None, normal Tongue: None, normal,Extremity Movements Upper (arms, wrists,  hands, fingers): None, normal Lower (legs, knees, ankles, toes): None, normal, Trunk Movements Neck, shoulders, hips: None, normal, Overall Severity Severity of abnormal movements (highest score from questions above): None, normal Incapacitation due to abnormal movements: None, normal Patient's awareness of abnormal movements (rate only patient's report): No Awareness, Dental Status Current problems with teeth and/or dentures?: No Does patient usually wear dentures?: No Aims score 0 on my examination today, 11/02/2021. CIWA:    COWS:     Musculoskeletal: Strength & Muscle Tone: within normal limits Gait & Station: normal Patient leans: N/A  Psychiatric Specialty Exam:  Presentation  General Appearance: Casual; Appropriate for Environment  Eye Contact:Good  Speech:Normal Rate; Clear and Coherent  Speech Volume:Normal  Handedness:Left   Mood and Affect  Mood: Anxious, irritable  Affect:Congruent Constricted   Thought Process  Thought Processes:Coherent; Goal Directed  Descriptions of Associations:Intact  Orientation:Full (Time, Place and Person)  Thought Content:Logical  History of Schizophrenia/Schizoaffective disorder:Yes  Duration of Psychotic Symptoms:Less than six months  Hallucinations:No data recorded Reports AH.  reports VH.  Ideas of Reference: Denies paranoid thinking  Suicidal Thoughts:No data recorded Denies suicidal thoughts, denies intent or plan  Homicidal Thoughts:No data recorded Denies having homicidal thoughts.  Denies having homicidal thoughts directed towards anyone on the unit at this time. Denies homicidal thoughts are directed towards someone outside of the psychiatric unit.  Sensorium  Memory:Immediate Fair; Recent Fair  Judgment: Improving  Insight: Improving   Executive Functions  Concentration:Good  Attention Span:Good  [place]  Language:Good   Psychomotor Activity  Psychomotor  Activity:No data recorded Normal.  No tremor or EPS observed.   Assets  Assets:Communication Skills; Desire for Improvement; Resilience   Sleep  6.75    Physical Exam: Physical Exam Vitals and nursing note reviewed.  Constitutional:      General: He is not in acute distress.    Appearance: He is not toxic-appearing.  Cardiovascular:     Rate and Rhythm: Normal rate.  Pulmonary:     Effort: Pulmonary effort is normal.  Neurological:     Mental Status: He is alert.     Gait: Gait abnormal.  Psychiatric:        Behavior: Behavior normal.   Review of Systems  Constitutional:  Negative for chills and fever.  Eyes: Negative.   Cardiovascular: Negative.  Negative for chest pain and palpitations.  Genitourinary: Negative.   Neurological:  Negative for dizziness, tingling, tremors and headaches.  Psychiatric/Behavioral:  Positive for hallucinations, substance abuse and suicidal ideas. The patient is nervous/anxious. The patient does not have insomnia.   All other systems reviewed and are negative.  Blood pressure 140/83, pulse (!) 101, temperature (!) 97.5 F (36.4 C), temperature source Oral, resp. rate 19, height 6' 3.5" (1.918 m), weight 110.7 kg, SpO2 100 %. Body mass index is 30.1 kg/m.  Treatment Plan Summary: Daily contact with patient to assess and evaluate symptoms and progress in treatment and Medication management  Assessment: Schizoaffective disorder Major depressive disorder Substance-induced mood disorder    Plan: -Continue Thorazine 37.5 mg at 8 AM, 37.5 mg at noon, and 50 mg at bedtime -for psychosis, mood stabilization, and impulse control -Continue Cogentin 0.5 mg p.o. twice daily -Continue Depakote 750 mg p.o. twice daily - for mood stabilization -Continue gabapentin to 400 mg p.o. 3 times daily-for anxiety and neuropathic pain -Increase Risperdal 1 mg twice daily-for hallucinations.   -Continue propranolol 10 mg 3 times daily - for anxiety   -Continue losartan to 50 mg once daily  -Continue amlodipine 5 mg once daily  -Continue lozenge for dry mouth -Continue lactulose for elevated ammonia level.   R hand weakness/neuropathy:   Follow up with neurology for further evaluation, treatment   Patient encouraged to participate within the therapeutic milieu  CSW to continue working on discharge disposition   Maida Sale, MD  11/07/2021, 2:18 PM  Total Time Spent in Direct Patient Care:  I personally spent 30 minutes on the unit in direct patient care. The direct patient care time included face-to-face time with the patient, reviewing the patient's chart, communicating with other professionals, and coordinating care. Greater than 50% of this time was spent in counseling or coordinating care with the patient regarding goals of hospitalization, psycho-education, and discharge planning needs.   Maida Sale, MD

## 2021-11-07 NOTE — [facility] Group Notes (Signed)
Pt did not attend wrap up group this evening. Pt was in bed sleeping.  

## 2021-11-07 NOTE — Progress Notes (Signed)
?   11/07/21 0530  ?Sleep  ?Number of Hours 6.75  ? ? ?

## 2021-11-08 DIAGNOSIS — R29898 Other symptoms and signs involving the musculoskeletal system: Secondary | ICD-10-CM

## 2021-11-08 DIAGNOSIS — G5691 Unspecified mononeuropathy of right upper limb: Secondary | ICD-10-CM

## 2021-11-08 MED ORDER — FLUTICASONE PROPIONATE 50 MCG/ACT NA SUSP: 1.0000 | Freq: Every day | NASAL | Status: DC | PRN

## 2021-11-08 NOTE — Group Note (Signed)
Date:  11/08/2021 ?Time:  11:25 AM ? ?Group Topic/Focus:  ?Orientation:   The focus of this group is to educate the patient on the purpose and policies of crisis stabilization and provide a format to answer questions about their admission.  The group details unit policies and expectations of patients while admitted. ? ? ? ?Participation Level:  Active ? ?Participation Quality:  Appropriate ? ?Affect:  Appropriate ? ?Cognitive:  Appropriate ? ?Insight: Appropriate ? ?Engagement in Group:  Limited ? ?Modes of Intervention:  Discussion ? ?Additional Comments:   ? ?Duane Salazar ?11/08/2021, 11:25 AM ? ?

## 2021-11-08 NOTE — Progress Notes (Signed)
Adult Psychoeducational Group Note ? ?Date:  11/08/2021 ?Time:  8:46 PM ? ?Group Topic/Focus:  ?Wrap-Up Group:   The focus of this group is to help patients review their daily goal of treatment and discuss progress on daily workbooks. ? ?Participation Level:  Active ? ?Participation Quality:  Appropriate ? ?Affect:  Appropriate ? ?Cognitive:  Appropriate ? ?Insight: Appropriate ? ?Engagement in Group:  Engaged ? ?Modes of Intervention:  Discussion ? ?Additional Comments:   Pt stated his goal for today was to focus on his treatment plan. Pt stated he accomplished his goal today. Pt stated he talked with his doctor and social worker about his care today. Pt rated his overall day a 8 out of 10. Pt stated he was able to contact his mother today which improved his overall day. Pt stated he felt better about himself today. Pt stated he was able to attend all meals. Pt stated he took all medications provided today. Pt stated he attend all groups held today. Pt stated his appetite was fair today. Pt rated sleep last night was pretty good. Pt stated the goal tonight was to get some rest. Pt stated he had no physical pain tonight. Pt admitted to dealing with visual hallucinations and auditory issues tonight.Pt nurse was updated on the situation.  Pt denies thoughts of harming himself or others. Pt stated he would alert staff if anything changed. ? ?Candy Sledge ?11/08/2021, 8:46 PM ?

## 2021-11-08 NOTE — Progress Notes (Signed)
[facility] MD Progress Note  11/08/2021 4:37 PM Yasir Kitner  MRN:  [id number]  Subjective:   Jonluke Silliman is a 46 yr old male who presented on 3/1 to [facility] for worsening depression and a Suicide Attempt via OD (Meth), he was admitted to [facility] on 3/3.  PPHx is significant for Schizoaffective Disorder, Anxiety, 1 previous Suicide Attempt (jump off bridge), and ~10 previous hospitalizations (latest [facility] 2017).    Per nursing: slept 7 hours last night. Less AH.    On assessment today, he feels that the risperdal is "gotta get used to it a little, but in a good way". He feels that his sleep last night was not as good as before. His appetite is good. He has a headache today, and nasal congestion. He is worried about going out in public and being violent. He doesn't currently have any aggressive thoughts. He has not thought about those that he would harm today, but admits that he does still have thoughts of harming them. No thoughts of harming anyone here. No thoughts of harming self. He has hallucinations in his periferal vision and he does not like those, and would like them to go away completely.    Principal Problem: Schizoaffective disorder ([place]) Diagnosis: Principal Problem:   Schizoaffective disorder ([place]) Active Problems:   Schizoaffective disorder, depressive type (HCC)   Tobacco use disorder   Polysubstance dependence (HCC)   Right hand weakness   Neuropathy of hand, right  Total Time spent with patient: 20 minutes  Past Psychiatric History: Charted history with schizoaffective disorder, substance use disorder and suicidal ideation.  He reported previous inpatient admissions.   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   Schizo affective  schizophrenia ([place]) 1995   Schizophrenia ([place])    Seizures ([place])    Sleep apnea 1995   on CPAP    Past Surgical History:  Procedure Laterality Date   PITUITARY SURGERY  2005 & 2012   SKIN BIOPSY     Family History:  Family History  Problem Relation Age of Onset   Hypertension Mother    Diabetes Mother    Heart Problems Mother    Cancer Maternal Uncle    Alcoholism Maternal Uncle    Cancer Maternal Grandmother    Heart disease Neg Hx    Family Psychiatric  History:  Social History:  Social History   Substance and Sexual Activity  Alcohol Use Yes   Alcohol/week: 2.0 standard drinks   Types: 2 Cans of beer per week   Comment: 2 cans on workdays; 6-pk on days off; varies     Social History   Substance and Sexual Activity  Drug Use Yes   Types: Cocaine, Marijuana, Heroin   Comment: Reports use 2 grams Cocaine 10/28/21    Social History   Socioeconomic History   Marital status: Single    Spouse name: Not on file   Number of children: 2   Years of education: GED   Highest education level: Not on file  Occupational History   Not on file  Tobacco Use   Smoking status: Every Day    Packs/day: 1.00    Years: 20.00    Pack years: 20.00  Types: Cigarettes   Smokeless tobacco: Never   Tobacco comments:    Smoking .5 ppd  Substance and Sexual Activity   Alcohol use: Yes    Alcohol/week: 2.0 standard drinks    Types: 2 Cans of beer per week    Comment: 2 cans on workdays; 6-pk on days off; varies   Drug use: Yes    Types: Cocaine, Marijuana, Heroin    Comment: Reports use 2 grams Cocaine 10/28/21   Sexual activity: Yes  Other Topics Concern   Not on file  Social History Narrative   Lives with mom.   Incarcerated for 22 months in [place], [place]. From 2014-09/2014   Social Determinants of Health   Financial Resource Strain: Not on file  Food Insecurity: Not on file  Transportation Needs: Not on file  Physical Activity: Not on file  Stress: Not on file  Social  Connections: Not on file   Additional Social History:                         Sleep: Fair improving   Appetite:  Fair  Current Medications: Current Facility-Administered Medications  Medication Dose Route Frequency Provider Last Rate Last Admin   acetaminophen (TYLENOL) tablet 650 mg  650 mg Oral Q6H PRN White, Patrice L, NP   650 mg at 11/08/21 0806   alum & mag hydroxide-simeth (MAALOX/MYLANTA) 200-200-20 MG/5ML suspension 30 mL  30 mL Oral Q4H PRN White, Patrice L, NP       amLODipine (NORVASC) tablet 5 mg  5 mg Oral Daily Massengill, Nathan, MD   5 mg at 11/08/21 0738   benztropine (COGENTIN) tablet 0.5 mg  0.5 mg Oral BID Massengill, Ovid Curd, MD   0.5 mg at 11/08/21 1604   celecoxib (CELEBREX) capsule 200 mg  200 mg Oral Daily White, Patrice L, NP   200 mg at 11/08/21 2595   chlorproMAZINE (THORAZINE) tablet 37.5 mg  37.5 mg Oral BID Massengill, Ovid Curd, MD   37.5 mg at 11/08/21 1118   chlorproMAZINE (THORAZINE) tablet 50 mg  50 mg Oral QHS Massengill, Ovid Curd, MD   50 mg at 11/07/21 2058   divalproex (DEPAKOTE) DR tablet 750 mg  750 mg Oral BID Briant Cedar, MD   750 mg at 11/08/21 0738   feeding supplement (ENSURE ENLIVE / ENSURE PLUS) liquid 237 mL  237 mL Oral Q24H Nelda Marseille, Amy E, MD   237 mL at 11/08/21 0943   gabapentin (NEURONTIN) capsule 400 mg  400 mg Oral TID Janine Limbo, MD   400 mg at 11/08/21 1604   hydrOXYzine (ATARAX) tablet 25 mg  25 mg Oral TID PRN Janine Limbo, MD   25 mg at 11/08/21 1120   lactulose (CHRONULAC) 10 GM/15ML solution 20 g  20 g Oral Q8H Massengill, Nathan, MD   20 g at 11/07/21 1311   OLANZapine zydis (ZYPREXA) disintegrating tablet 5 mg  5 mg Oral Q6H PRN Massengill, Ovid Curd, MD   5 mg at 11/01/21 2130   And   LORazepam (ATIVAN) tablet 1 mg  1 mg Oral Q6H PRN Massengill, Ovid Curd, MD       And   ziprasidone (GEODON) injection 20 mg  20 mg Intramuscular Q6H PRN Massengill, Nathan, MD       losartan (COZAAR) tablet 50 mg   50 mg Oral Daily Massengill, Ovid Curd, MD   50 mg at 11/08/21 0739   magnesium hydroxide (MILK OF MAGNESIA) suspension 30 mL  30 mL Oral  Daily PRN White, Patrice L, NP       menthol-cetylpyridinium (CEPACOL) lozenge 3 mg  1 lozenge Oral PRN Massengill, Ovid Curd, MD   3 mg at 11/04/21 1150   metFORMIN (GLUCOPHAGE) tablet 500 mg  500 mg Oral Q breakfast White, Patrice L, NP   500 mg at 11/08/21 6789   neomycin-bacitracin-polymyxin (NEOSPORIN) ointment   Topical PRN Harlow Asa, MD       predniSONE (DELTASONE) tablet 5 mg  5 mg Oral Q breakfast White, Patrice L, NP   5 mg at 11/08/21 0738   propranolol (INDERAL) tablet 10 mg  10 mg Oral TID Janine Limbo, MD   10 mg at 11/08/21 1604   risperiDONE (RISPERDAL) tablet 1 mg  1 mg Oral Q12H Aritha Huckeba, Jackie Plum, MD   1 mg at 11/08/21 3810   traZODone (DESYREL) tablet 100 mg  100 mg Oral QHS PRN Prescilla Sours, PA-C   100 mg at 11/06/21 2039    Lab Results:  No results found for this or any previous visit (from the past 48 hour(s)).    Blood Alcohol level:  Lab Results  Component Value Date   ETH <10 10/28/2021   ETH <10 17/51/0258    Metabolic Disorder Labs: Lab Results  Component Value Date   HGBA1C 6.5 (H) 10/31/2021   MPG 139.85 10/31/2021   No results found for: PROLACTIN Lab Results  Component Value Date   CHOL 128 10/31/2021   TRIG 90 10/31/2021   HDL 42 10/31/2021   CHOLHDL 3.0 10/31/2021   VLDL 18 10/31/2021   LDLCALC 68 10/31/2021   LDLCALC 81 09/04/2021    Physical Findings: AIMS: Facial and Oral Movements Muscles of Facial Expression: None, normal Lips and Perioral Area: None, normal Jaw: None, normal Tongue: None, normal,Extremity Movements Upper (arms, wrists, hands, fingers): None, normal Lower (legs, knees, ankles, toes): None, normal, Trunk Movements Neck, shoulders, hips: None, normal, Overall Severity Severity of abnormal movements (highest score from questions above): None, normal Incapacitation  due to abnormal movements: None, normal Patient's awareness of abnormal movements (rate only patient's report): No Awareness, Dental Status Current problems with teeth and/or dentures?: No Does patient usually wear dentures?: No Aims score 0 on my examination today, 11/02/2021. CIWA:    COWS:     Musculoskeletal: Strength & Muscle Tone: within normal limits Gait & Station: normal Patient leans: N/A  Psychiatric Specialty Exam:  Presentation  General Appearance: Casual; Appropriate for Environment  Eye Contact:Good  Speech:Normal Rate; Clear and Coherent  Speech Volume:Normal  Handedness:Left   Mood and Affect  Mood: Anxious, irritable  Affect:Congruent Constricted   Thought Process  Thought Processes:Coherent; Goal Directed  Descriptions of Associations:Intact  Orientation:Full (Time, Place and Person)  Thought Content:Logical  History of Schizophrenia/Schizoaffective disorder:Yes  Duration of Psychotic Symptoms:Less than six months  Hallucinations:No data recorded Reports AH.  reports VH.  Ideas of Reference: Denies paranoid thinking  Suicidal Thoughts:No data recorded Denies suicidal thoughts, denies intent or plan  Homicidal Thoughts:No data recorded admits having homicidal thoughts.  Denies having homicidal thoughts directed towards anyone on the unit at this time. Admits homicidal thoughts are directed towards someone outside of the psychiatric unit.    Sensorium  Memory:Immediate Fair; Recent Fair  Judgment: Improving  Insight: Improving   Executive Functions  Concentration:Good  Attention Span:Good  [place]  Language:Good   Psychomotor Activity  Psychomotor Activity:No data recorded Normal.  No tremor or EPS observed.   Assets  Assets:Communication Skills;  Desire for Improvement; Resilience   Sleep  6.75    Physical Exam: Physical Exam Vitals and nursing note reviewed.  Constitutional:       General: He is not in acute distress.    Appearance: He is not toxic-appearing.  Cardiovascular:     Rate and Rhythm: Normal rate.  Pulmonary:     Effort: Pulmonary effort is normal.  Neurological:     Mental Status: He is alert.     Gait: Gait abnormal.  Psychiatric:        Behavior: Behavior normal.   Review of Systems  Constitutional:  Negative for chills and fever.  Eyes: Negative.   Cardiovascular: Negative.  Negative for chest pain and palpitations.  Genitourinary: Negative.   Neurological:  Negative for dizziness, tingling, tremors and headaches.  Psychiatric/Behavioral:  Positive for hallucinations, substance abuse and suicidal ideas. The patient is nervous/anxious. The patient does not have insomnia.   All other systems reviewed and are negative.  Blood pressure (!) 135/92, pulse 89, temperature (!) 97.4 F (36.3 C), temperature source Oral, resp. rate 18, height 6' 3.5" (1.918 m), weight 110.7 kg, SpO2 100 %. Body mass index is 30.1 kg/m.  Treatment Plan Summary: Daily contact with patient to assess and evaluate symptoms and progress in treatment and Medication management  Assessment: Schizoaffective disorder Major depressive disorder Substance-induced mood disorder    Plan: -Continue Thorazine 37.5 mg at 8 AM, 37.5 mg at noon, and 50 mg at bedtime -for psychosis, mood stabilization, and impulse control -Continue Cogentin 0.5 mg p.o. twice daily -Continue Depakote 750 mg p.o. twice daily - for mood stabilization -Continue gabapentin to 400 mg p.o. 3 times daily-for anxiety and neuropathic pain -Continue Risperdal 1 mg twice daily-for hallucinations.   -Continue propranolol 10 mg 3 times daily - for anxiety  -Continue losartan to 50 mg once daily  -Continue amlodipine 5 mg once daily  -Continue lozenge for dry mouth -Continue lactulose for elevated ammonia level.   R hand weakness/neuropathy:   Follow up with neurology for further evaluation,  treatment   Patient encouraged to participate within the therapeutic milieu  CSW to continue working on discharge disposition   Maida Sale, MD  11/08/2021, 4:37 PM  Total Time Spent in Direct Patient Care:  I personally spent 30 minutes on the unit in direct patient care. The direct patient care time included face-to-face time with the patient, reviewing the patient's chart, communicating with other professionals, and coordinating care. Greater than 50% of this time was spent in counseling or coordinating care with the patient regarding goals of hospitalization, psycho-education, and discharge planning needs.   Maida Sale, MD

## 2021-11-08 NOTE — Group Note (Signed)
Date:  11/08/2021 ?Time:  6:26 PM ? ?Group Topic/Focus:  ?Goals Group:   The focus of this group is to help patients establish daily goals to achieve during treatment and discuss how the patient can incorporate goal setting into their daily lives to aide in recovery. ? ? ? ?Participation Level:  Active ? ?Participation Quality:  Appropriate ? ?Affect:  Appropriate ? ?Cognitive:  Appropriate ? ?Insight: Appropriate ? ?Engagement in Group:  Engaged ? ?Modes of Intervention:  Discussion ? ?Additional Comments:  Pt wants to work on his temper and coping skills ? ?[name] ?11/08/2021, 6:26 PM ? ?

## 2021-11-08 NOTE — [facility] Group Notes (Signed)
LCSW Group Therapy Note ? ?No social work group was held today due to a scheduling conflict.   ? ?Selmer Dominion, LCSW ?06/20/2021 ?5:03 PM  ?  ? ?

## 2021-11-08 NOTE — Progress Notes (Signed)
Duane Salazar was up and visible on the unit.  He attended evening wrap up group.  He reported that his voices have been increasing this evening.  He explained there are two different voices that are having a conversation in his head.  He denied SI but continues to voice intermittent HI but denied that it is anyone at [facility].  He took his hs medications but refused Lactulose because "It's just too much."  He didn't explain further.  He denied any pain or discomfort and appeared to be in no physical distress.  Q 15 minute checks maintained for safety. ? ? 11/08/21 2126  ?Psych Admission Type (Psych Patients Only)  ?Admission Status Voluntary  ?Psychosocial Assessment  ?Patient Complaints Anxiety;Other (Comment) ?(auditory hallucinations)  ?Eye Contact Brief  ?Facial Expression Flat  ?Affect Flat  ?Speech Logical/coherent  ?Interaction Assertive  ?Motor Activity Slow  ?Appearance/Hygiene Unremarkable  ?Behavior Characteristics Cooperative;Appropriate to situation  ?Mood Anxious  ?Thought Process  ?Coherency WDL  ?Content WDL  ?Delusions None reported or observed  ?Perception Hallucinations  ?Hallucination Auditory  ?Judgment Impaired  ?Confusion None  ?Danger to Self  ?Current suicidal ideation? Denies  ?Danger to Others  ?Danger to Others None reported or observed  ? ? ?

## 2021-11-08 NOTE — Progress Notes (Signed)
Pt denies SI/HI/AVH and verbally agrees to approach staff if these become apparent or before harming themselves/others. Rates depression 5/10. Rates anxiety 8/10. Rates pain 6/10. Pt has been in the dayroom for most of the day and has had no complaints. Pt does not seem to be responding as much as he was previously. Scheduled medications administered to pt, per MD orders. RN provided support and encouragement to pt. Q15 min safety checks implemented and continued. Pt safe on the unit. RN will continue to monitor and intervene as needed.  ? 11/08/21 0738  ?Psych Admission Type (Psych Patients Only)  ?Admission Status Voluntary  ?Psychosocial Assessment  ?Patient Complaints Anxiety;Depression  ?Eye Contact Fair  ?Facial Expression Flat  ?Affect Appropriate to circumstance  ?Speech Logical/coherent  ?Interaction Assertive  ?Motor Activity Slow  ?Appearance/Hygiene Unremarkable  ?Behavior Characteristics Cooperative;Appropriate to situation;Anxious  ?Mood Depressed;Anxious  ?Thought Process  ?Coherency WDL  ?Content WDL  ?Delusions None reported or observed  ?Perception Hallucinations  ?Hallucination None reported or observed  ?Judgment Impaired  ?Confusion None  ?Danger to Self  ?Current suicidal ideation? Denies  ?Danger to Others  ?Danger to Others Reported or observed  ?Danger to Others Abnormal  ?Harmful Behavior to others No threats or harm toward other people  ?Destructive Behavior No threats or harm toward property  ? ? ?

## 2021-11-09 ENCOUNTER — Encounter (HOSPITAL_COMMUNITY): Payer: Self-pay

## 2021-11-09 MED ORDER — RISPERIDONE 0.5 MG PO TABS
0.5000 mg | ORAL_TABLET | Freq: Every day | ORAL | Status: DC
  Administered 2021-11-10 – 2021-11-11 (×2): 0.5 mg via ORAL
  Filled 2021-11-09: qty 7
  Filled 2021-11-09 (×3): qty 1

## 2021-11-09 MED ORDER — RISPERIDONE 1 MG PO TABS
1.0000 mg | ORAL_TABLET | Freq: Every day | ORAL | Status: DC
  Administered 2021-11-09 – 2021-11-10 (×2): 1 mg via ORAL
  Filled 2021-11-09: qty 1
  Filled 2021-11-09: qty 7
  Filled 2021-11-09 (×2): qty 1

## 2021-11-09 NOTE — Progress Notes (Signed)
[facility] MD Progress Note  11/09/2021 3:56 PM Duane Salazar  MRN:  [id number] Subjective:   Duane Salazar is a 46 yr old male who presented on 3/1 to [facility] for worsening depression and a Suicide Attempt via OD (Meth), he was admitted to [facility] on 3/3.  PPHx is significant for Schizoaffective Disorder, Anxiety, 1 previous Suicide Attempt (jump off bridge), and ~10 previous hospitalizations (latest [facility] 2017).   Case was discussed in the multidisciplinary team. MAR was reviewed and patient was mostly compliant with medications, he did not take the Lactulose.   Psychiatric Team made the following recommendations yesterday: -Continue Thorazine 37.5 mg at 8 AM, 37.5 mg at noon, and 50 mg at bedtime -for psychosis, mood stabilization, and impulse control -Continue Cogentin 0.5 mg p.o. twice daily -Continue Depakote 750 mg p.o. twice daily - for mood stabilization -Continue gabapentin to 400 mg p.o. 3 times daily-for anxiety and neuropathic pain -Continue Risperdal 1 mg twice daily-for hallucinations.   -Continue propranolol 10 mg 3 times daily - for anxiety  -Continue losartan to 50 mg once daily  -Continue amlodipine 5 mg once daily  -Continue lactulose for elevated ammonia level.    On interview today patient reports he slept well last night.  He reports his appetite is doing good.  He reports continuing to have baseline passive SI with no plan or intent and does contract for safety on the unit.  He has report having passive HI still towards a few specific people outside the hospital however states he has no intention of harming them and that if he did run into them he would not do anything.  He reports no AH.  He reports having VH last night when he got up to use the bathroom he states he saw his girlfriend in the other bed in the room.  He he reports still having some mild paranoia but states he is now questioning it.  He reports no thought insertion/deletion or special messages.  He reports having some  dizziness/wooziness today.  He states it started after the increase in his Risperdal and the one dose of lactulose he took.  When asked why he only took 1 dose of lactulose he states that because it "cleaned him out real good."  Discussed with him that we would reduce his morning dose of Risperdal as this could be causing sedation.  Also discussed that because he had such a significant response to lactulose he could be fluid depleted which would exacerbate his symptoms.  Discussed we would stop the lactulose and encouraged him to increase his fluid intake and asked for Gatorade today.  He was agreeable to this and had no other concerns at present.   Principal Problem: Schizoaffective disorder ([place]) Diagnosis: Principal Problem:   Schizoaffective disorder ([place]) Active Problems:   Schizoaffective disorder, depressive type (HCC)   Tobacco use disorder   Polysubstance dependence ([place])   Right hand weakness   Neuropathy of hand, right  Total Time spent with patient:  I personally spent 30 minutes on the unit in direct patient care. The direct patient care time included face-to-face time with the patient, reviewing the patient's chart, communicating with other professionals, and coordinating care. Greater than 50% of this time was spent in counseling or coordinating care with the patient regarding goals of hospitalization, psycho-education, and discharge planning needs.   Past Psychiatric History: Schizoaffective Disorder, Anxiety, 1 previous Suicide Attempt (jump off bridge), and ~10 previous hospitalizations (latest [facility] 2017).  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 Social History:  Social History   Substance and Sexual Activity  Alcohol Use Yes   Alcohol/week: 2.0 standard drinks   Types: 2 Cans of beer per week   Comment: 2 cans on workdays; 6-pk on days off; varies     Social History   Substance and Sexual Activity  Drug Use Yes   Types: Cocaine, Marijuana, Heroin   Comment: Reports use 2 grams Cocaine 10/28/21    Social History   Socioeconomic History   Marital status: Single    Spouse name: Not on file   Number of children: 2   Years of education: GED   Highest education level: Not on file  Occupational History   Not on file  Tobacco Use   Smoking status: Every Day    Packs/day: 1.00    Years: 20.00    Pack years: 20.00    Types: Cigarettes   Smokeless tobacco: Never   Tobacco comments:    Smoking .5 ppd  Substance and Sexual Activity   Alcohol use: Yes    Alcohol/week: 2.0 standard drinks    Types: 2 Cans of beer per week    Comment: 2 cans on workdays; 6-pk on days off; varies   Drug use: Yes    Types: Cocaine, Marijuana, Heroin    Comment: Reports use 2 grams Cocaine 10/28/21   Sexual activity: Yes  Other Topics Concern   Not on file  Social History Narrative   Lives with mom.   Incarcerated for 22 months in [place], [place]. From 2014-09/2014   Social Determinants of Health   Financial Resource Strain: Not on file  Food Insecurity: Not on file  Transportation Needs: Not on file  Physical Activity: Not on file  Stress: Not on file  Social Connections: Not on file    Additional Social History:                         Sleep: Good  Appetite:  Good  Current Medications: Current Facility-Administered Medications  Medication Dose Route Frequency Provider Last Rate Last Admin   acetaminophen (TYLENOL) tablet 650 mg  650 mg Oral Q6H PRN White, Patrice L, NP   650 mg at 11/08/21 0806   alum & mag hydroxide-simeth (MAALOX/MYLANTA) 200-200-20 MG/5ML suspension 30 mL  30 mL Oral Q4H PRN White, Patrice L, NP       amLODipine (NORVASC) tablet 5 mg  5 mg Oral Daily Massengill, Nathan, MD   5 mg at 11/09/21 [phone]  benztropine (COGENTIN) tablet 0.5 mg  0.5 mg Oral BID Massengill, Ovid Curd, MD   0.5 mg at 11/09/21 3329   celecoxib (CELEBREX) capsule 200 mg  200 mg Oral Daily White, Patrice L, NP   200 mg at 11/09/21 5188   chlorproMAZINE (THORAZINE) tablet 37.5 mg  37.5 mg Oral BID Massengill, Ovid Curd, MD   37.5 mg at 11/09/21 1146   chlorproMAZINE (THORAZINE) tablet 50 mg  50 mg Oral QHS Massengill, Ovid Curd, MD   50 mg at 11/08/21 2127   divalproex (DEPAKOTE) DR tablet 750 mg  750 mg Oral BID Briant Cedar, MD   750 mg at 11/09/21 0800   feeding supplement (ENSURE ENLIVE / ENSURE PLUS) liquid 237 mL  237 mL Oral Q24H Nelda Marseille, Amy E, MD   237 mL at 11/09/21 1333   fluticasone (FLONASE) 50 MCG/ACT nasal spray 1 spray  1 spray Each Nare Daily PRN Maida Sale, MD       gabapentin (NEURONTIN) capsule 400 mg  400 mg Oral TID Janine Limbo, MD   400 mg at 11/09/21 1146   hydrOXYzine (ATARAX) tablet 25 mg  25 mg Oral TID PRN Janine Limbo, MD   25 mg at 11/09/21 1146   OLANZapine zydis (ZYPREXA) disintegrating tablet 5 mg  5 mg Oral Q6H PRN Massengill, Ovid Curd, MD   5 mg at 11/01/21 2130   And   LORazepam (ATIVAN) tablet 1 mg  1 mg Oral Q6H PRN Massengill, Ovid Curd, MD       And   ziprasidone (GEODON) injection 20 mg  20 mg Intramuscular Q6H PRN Massengill, Ovid Curd, MD       losartan (COZAAR) tablet 50 mg  50 mg Oral Daily Massengill,  Nathan, MD   50 mg at 11/09/21 0800   magnesium hydroxide (MILK OF MAGNESIA) suspension 30 mL  30 mL Oral Daily PRN White, Patrice L, NP       menthol-cetylpyridinium (CEPACOL) lozenge 3 mg  1 lozenge Oral PRN Massengill, Ovid Curd, MD   3 mg at 11/04/21 1150   metFORMIN (GLUCOPHAGE) tablet 500 mg  500 mg Oral Q breakfast White, Patrice L, NP   500 mg at 11/09/21 0800   neomycin-bacitracin-polymyxin (NEOSPORIN) ointment   Topical PRN Harlow Asa, MD       predniSONE (DELTASONE) tablet 5 mg  5 mg Oral Q breakfast White, Patrice L, NP   5 mg at 11/09/21 0800   propranolol (INDERAL) tablet 10 mg  10 mg Oral TID Janine Limbo, MD   10 mg at 11/09/21 1153   risperiDONE (RISPERDAL) tablet 1 mg  1 mg Oral Q12H Hill, Jackie Plum, MD   1 mg at 11/09/21 0800   traZODone (DESYREL) tablet 100 mg  100 mg Oral QHS PRN Prescilla Sours, PA-C   100 mg at 11/06/21 2039    Lab Results: No results found for this or any previous visit (from the past 48 hour(s)).  Blood Alcohol level:  Lab Results  Component Value Date   ETH <10 10/28/2021   ETH <10 41/66/0630    Metabolic Disorder Labs: Lab Results  Component Value Date   HGBA1C 6.5 (H) 10/31/2021   MPG 139.85 10/31/2021   No results found for: PROLACTIN Lab Results  Component Value Date   CHOL 128 10/31/2021   TRIG 90 10/31/2021   HDL 42 10/31/2021   CHOLHDL 3.0 10/31/2021   VLDL 18 10/31/2021   LDLCALC 68 10/31/2021   LDLCALC 81 09/04/2021    Physical Findings: AIMS:  Facial and Oral Movements Muscles of Facial Expression: None, normal Lips and Perioral Area: None, normal Jaw: None, normal Tongue: None, normal,Extremity Movements Upper (arms, wrists, hands, fingers): None, normal Lower (legs, knees, ankles, toes): None, normal, Trunk Movements Neck, shoulders, hips: None, normal, Overall Severity Severity of abnormal movements (highest score from questions above): None, normal Incapacitation due to abnormal movements: None,  normal Patient's awareness of abnormal movements (rate only patient's report): No Awareness, Dental Status Current problems with teeth and/or dentures?: No Does patient usually wear dentures?: No  CIWA:    COWS:     Musculoskeletal: Strength & Muscle Tone: within normal limits Gait & Station: normal Patient leans: N/A  Psychiatric Specialty Exam:  Presentation  General Appearance: Appropriate for Environment; Casual  Eye Contact:Good  Speech:Clear and Coherent; Normal Rate  Speech Volume:Normal  Handedness:Left   Mood and Affect  Mood:Dysphoric  Affect:Congruent; Appropriate   Thought Process  Thought Processes:Coherent; Goal Directed  Descriptions of Associations:Intact  Orientation:Full (Time, Place and Person)  Thought Content:Logical Reports having baseline passive SI with no intent.  He reports continued passive HI but no plans/intent. Reports VH- seeing his girlfriend in other rooms bed last night.  He reports no AH.  Reports still having Paranoia but is questioning it.  No Ideas of Reference or First Rank symptoms.  History of Schizophrenia/Schizoaffective disorder:Yes  Duration of Psychotic Symptoms:Less than six months  Hallucinations:Hallucinations: Visual Description of Visual Hallucinations: saw his girlfriend in the other bed in his room last night.  Ideas of Reference:Paranoia  Suicidal Thoughts:Suicidal Thoughts: Yes, Passive SI Passive Intent and/or Plan: Without Intent; Without Plan  Homicidal Thoughts:Homicidal Thoughts: Yes, Passive HI Passive Intent and/or Plan: Without Intent; Without Plan   Sensorium  Memory:Immediate Fair; Recent Fair  Judgment:Fair  Insight:Fair   Executive Functions  Concentration:Good  Attention Span:Good  [place]   Psychomotor Activity  Psychomotor Activity:Psychomotor Activity: Normal  Assets  Assets:Communication Skills; Desire for Improvement;  Resilience   Sleep  Sleep:Sleep: Good Number of Hours of Sleep: 7.5   Physical Exam: Physical Exam Vitals and nursing note reviewed.  Constitutional:      General: He is not in acute distress.    Appearance: He is normal weight. He is not ill-appearing or toxic-appearing.     Comments: Acromegaly- large hands and jaw   HENT:     Head: Normocephalic and atraumatic.  Pulmonary:     Effort: Pulmonary effort is normal.  Musculoskeletal:        General: Normal range of motion.  Neurological:     General: No focal deficit present.     Mental Status: He is alert.   Review of Systems  Respiratory:  Negative for cough and shortness of breath.   Cardiovascular:  Negative for chest pain.  Gastrointestinal:  Negative for abdominal pain, constipation, diarrhea, nausea and vomiting.  Neurological:  Positive for dizziness. Negative for weakness and headaches.  Psychiatric/Behavioral:  Negative for depression.   Blood pressure (!) 142/94, pulse 95, temperature (!) 97.5 F (36.4 C), temperature source Oral, resp. rate 20, height 6' 3.5" (1.918 m), weight 110.7 kg, SpO2 100 %. Body mass index is 30.1 kg/m.   Treatment Plan Summary: Daily contact with patient to assess and evaluate symptoms and progress in treatment and Medication management  Tyeson Wien is a 46 yr old male who presented on 3/1 to [facility] for worsening depression and a Suicide Attempt via OD (Meth), he was admitted to [facility] on 3/3.  PPHx is significant for Schizoaffective Disorder, Anxiety, 1 previous Suicide Attempt (jump off bridge), and ~10 previous hospitalizations (latest [facility] 2017).   Duane Salazar is having some sedation/wooziness today.  As this started recently after the increase in his Risperdal and Lactulose we will make changes.  We will reduce his Risperdal to 0.5 mg AM and continue 1 mg PM.  We will also stop Lactulose as he had a significant response to it, his symptoms could be due to fluid loss.  We will monitor his  response and plan for discharge tomorrow or Wednesday.  We will continue to monitor.     Schizoaffective Disorder: -Continue Thorazine 37.5 mg at 8 AM, 37.5 mg at noon, and 50 mg at bedtime -for psychosis, mood stabilization, and impulse control -Continue Cogentin 0.5 mg p.o. twice daily -Continue Depakote 750 mg p.o. twice daily - for mood stabilization -Continue Gabapentin 400 mg TID-for anxiety and neuropathic pain -Decrease Risperdal to 0.5 mg AM and 1 mg PM for hallucinations.   -Continue propranolol 10 mg TID - for anxiety    HTN: -Continue Losartan 50 mg daily  -Continue Amlodipine 5 mg daily    Hyperammoniemia: -Ammonia (3/9)- 47  -Stop Lactulose due to fluid loss.   Diabetes: -Continue Metformin 500 mg daily     Acromegaly: -Continue Prednisone 5 mg daily -Continue Celebrex 200 mg daily for joint pain   -Continue lozenge for dry mouth   Labs from Admission: CMP: WNL except Na:132 and AST: 46,  CBC: WNL except  RBC: 6.01,  MCV: 76.0,  MCH: 23.5.  UDS: Cocaine, Amphet, THC positive,  EtOH: WNL  Resp panel: Neg  CT Head: No acute intracranial abnormalities  EKG: NSR with Qtc: 452 Labs (3/4): A1c: 6.5,  Lipid Panel: WNL,  TSH: 0.792 Labs (3/7): CMP: WNL except Ca: 8.8,  Total Bili: 0.2,  Valproic: 84 Labs(3/9): CMP: WNL except Creat: 0.54, Ca: 8.6,  CBC: WNL except MCV: 76.3,  MCH: 23.9,  TSH: 2.711,  Ammonia: 47,  Valproic: 93,   Vit D: 30.05, B12: [place], MD 11/09/2021, 3:56 PM

## 2021-11-09 NOTE — Progress Notes (Signed)
?   11/09/21 2005  ?Psych Admission Type (Psych Patients Only)  ?Admission Status Voluntary  ?Psychosocial Assessment  ?Patient Complaints Irritability  ?Eye Contact Brief  ?Facial Expression Anxious  ?Affect Irritable  ?Speech Logical/coherent  ?Interaction Assertive  ?Motor Activity Slow  ?Appearance/Hygiene Unremarkable  ?Behavior Characteristics Anxious  ?Mood Anxious  ?Thought Process  ?Coherency Circumstantial  ?Content WDL  ?Delusions None reported or observed  ?Perception Hallucinations  ?Hallucination None reported or observed  ?Judgment Impaired  ?Confusion None  ?Danger to Self  ?Current suicidal ideation? Denies  ?Danger to Others  ?Danger to Others None reported or observed  ? ?Pt seen in his room. Pt when asked if pt was suicidal, pt stated, "What would you do if I was?" Explained to pt that part of our job is to keep him safe and we would remove any objects that he could use to harm himself. Pt then denied SI, HI, AVH and pain. Pt denied anxiety and depression. Said he wanted to be alone because he was feeling like he wasn't being helped. Pt encouraged to reach out if he needed anything and then was left alone. Pt told MHT in group that he was passive SI but contracted for safety.  ?

## 2021-11-09 NOTE — [facility] Group Notes (Signed)
[place] Group Notes:  (Nursing/MHT/Case Management/Adjunct) ? ?Date:  11/09/2021  ?Time:  9:04 AM ? ?Type of Therapy:   Orientation/Goals group ? ?Participation Level:  Active ? ?Participation Quality:  Appropriate ? ?Affect:  Appropriate ? ?Cognitive:  Appropriate ? ?Insight:  Improving ? ?Engagement in Group:  Engaged and Improving ? ?Modes of Intervention:  Discussion, Education, and Orientation ? ?Summary of Progress/Problems: Pt goal for today is to be more active and move around more.  ? ?Gwen Her Jayceon Troy ?11/09/2021, 9:04 AM ?

## 2021-11-09 NOTE — Progress Notes (Signed)
Pt was encouraged but didn't attend therapeutic relaxation group. ?

## 2021-11-09 NOTE — Group Note (Signed)
Type of Therapy and Topic:  Group Therapy: Urge Surfing: Triggers ? ?Participation Level:  Active ? ?Description of Group: Recognizing Triggers: Patients defined triggers and discussed the importance of recognizing their personal warning signs. Patients identified their own triggers and how they tend to cope with stressful situations. Patients discussed areas such as people, places, things, and thoughts that trigger certain emotions for them. CSW provided support to patients and discussed safety planning for when these triggers occur. Group participants had opportunities to share openly with the group and participate in a group discussion while providing support and feedback to their peers.  Patients were provided with a technique: urge surfing, to help deal with triggers and create better outcomes for the patient. ? ? ? ?Therapeutic Goals: ? ?1.  Patients will identify a trigger that they are struggling with ?2. Patient will identify past negative outcomes to reacting to a trigger. ?3. Patient will identify emotions and feelings associated with the trigger ?4.  Patients will be introduced to Urge Surfing technique to assist with getting through triggers.  ? ?Summary of Patient Progress:   ? ?Patient participated appropriately in group.  Patient identified that too many people moving around can trigger him.  Patient states that he can often feel angry and want to hurt someone.  Patient identified exercise, punching a bag and going on a walk as positive coping skills to participate in rather than giving into the urge or trigger.  ? ? ? ?Duane Salazar [name], LCSW ?11/09/2021  1:53 PM   ? ?

## 2021-11-09 NOTE — BH IP Treatment Plan (Signed)
Interdisciplinary Treatment and Diagnostic Plan Update  11/09/2021 Time of Session: 10:55am Tyvion Edmondson MRN: [id number]  Principal Diagnosis: Schizoaffective disorder ([place])  Secondary Diagnoses: Principal Problem:   Schizoaffective disorder ([place]) Active Problems:   Schizoaffective disorder, depressive type ([place])   Tobacco use disorder   Polysubstance dependence ([place])   Right hand weakness   Neuropathy of hand, right   Current Medications:  Current Facility-Administered Medications  Medication Dose Route Frequency Provider Last Rate Last Admin   acetaminophen (TYLENOL) tablet 650 mg  650 mg Oral Q6H PRN White, Patrice L, NP   650 mg at 11/08/21 0806   alum & mag hydroxide-simeth (MAALOX/MYLANTA) 200-200-20 MG/5ML suspension 30 mL  30 mL Oral Q4H PRN White, Patrice L, NP       amLODipine (NORVASC) tablet 5 mg  5 mg Oral Daily Massengill, Nathan, MD   5 mg at 11/09/21 0759   benztropine (COGENTIN) tablet 0.5 mg  0.5 mg Oral BID Massengill, Ovid Curd, MD   0.5 mg at 11/09/21 0759   celecoxib (CELEBREX) capsule 200 mg  200 mg Oral Daily White, Patrice L, NP   200 mg at 11/09/21 0865   chlorproMAZINE (THORAZINE) tablet 37.5 mg  37.5 mg Oral BID Massengill, Ovid Curd, MD   37.5 mg at 11/09/21 0800   chlorproMAZINE (THORAZINE) tablet 50 mg  50 mg Oral QHS Massengill, Ovid Curd, MD   50 mg at 11/08/21 2127   divalproex (DEPAKOTE) DR tablet 750 mg  750 mg Oral BID Briant Cedar, MD   750 mg at 11/09/21 0800   feeding supplement (ENSURE ENLIVE / ENSURE PLUS) liquid 237 mL  237 mL Oral Q24H Nelda Marseille, Amy E, MD   237 mL at 11/08/21 0943   fluticasone (FLONASE) 50 MCG/ACT nasal spray 1 spray  1 spray Each Nare Daily PRN Maida Sale, MD       gabapentin (NEURONTIN) capsule 400 mg  400 mg Oral TID Janine Limbo, MD   400 mg at 11/09/21 0800   hydrOXYzine (ATARAX) tablet 25 mg  25 mg Oral TID PRN Janine Limbo, MD   25 mg at 11/08/21 1120   OLANZapine zydis (ZYPREXA)  disintegrating tablet 5 mg  5 mg Oral Q6H PRN Massengill, Ovid Curd, MD   5 mg at 11/01/21 2130   And   LORazepam (ATIVAN) tablet 1 mg  1 mg Oral Q6H PRN Massengill, Ovid Curd, MD       And   ziprasidone (GEODON) injection 20 mg  20 mg Intramuscular Q6H PRN Massengill, Ovid Curd, MD       losartan (COZAAR) tablet 50 mg  50 mg Oral Daily Massengill, Nathan, MD   50 mg at 11/09/21 0800   magnesium hydroxide (MILK OF MAGNESIA) suspension 30 mL  30 mL Oral Daily PRN White, Patrice L, NP       menthol-cetylpyridinium (CEPACOL) lozenge 3 mg  1 lozenge Oral PRN Massengill, Ovid Curd, MD   3 mg at 11/04/21 1150   metFORMIN (GLUCOPHAGE) tablet 500 mg  500 mg Oral Q breakfast White, Patrice L, NP   500 mg at 11/09/21 0800   neomycin-bacitracin-polymyxin (NEOSPORIN) ointment   Topical PRN Harlow Asa, MD       predniSONE (DELTASONE) tablet 5 mg  5 mg Oral Q breakfast White, Patrice L, NP   5 mg at 11/09/21 0800   propranolol (INDERAL) tablet 10 mg  10 mg Oral TID Janine Limbo, MD   10 mg at 11/09/21 0800   risperiDONE (RISPERDAL) tablet 1 mg  1 mg  Oral Q12H Hill, Jackie Plum, MD   1 mg at 11/09/21 0800   traZODone (DESYREL) tablet 100 mg  100 mg Oral QHS PRN Prescilla Sours, PA-C   100 mg at 11/06/21 2039   PTA Medications: Medications Prior to Admission  Medication Sig Dispense Refill Last Dose   benztropine (COGENTIN) 1 MG tablet Take 1 tablet (1 mg total) by mouth 2 (two) times daily. (Patient not taking: Reported on 10/28/2021) 60 tablet 3    celecoxib (CELEBREX) 200 MG capsule Take 1 capsule (200 mg total) by mouth daily. (Patient not taking: Reported on 10/28/2021) 30 capsule 1    ferrous sulfate 325 (65 FE) MG tablet Take 1 tablet by mouth every Monday and Friday (Patient not taking: Reported on 10/28/2021) 100 tablet 1    gabapentin (NEURONTIN) 400 MG capsule Take 1 capsule (400 mg total) by mouth 3 (three) times daily. (Patient not taking: Reported on 10/28/2021) 90 capsule 3    glucose monitoring kit  (FREESTYLE) monitoring kit 1 each by Does not apply route daily. Check glucose once in the morning before breakfast and 1 hour after a meal (Patient not taking: Reported on 02/07/2021) 1 each 0    lamoTRIgine (LAMICTAL) 25 MG tablet Take 1 tablet (25 mg total) by mouth daily. (Patient not taking: Reported on 10/28/2021) 120 tablet 2    losartan (COZAAR) 25 MG tablet Take 1 tablet (25 mg total) by mouth daily. (Patient not taking: Reported on 10/28/2021) 30 tablet 3    metFORMIN (GLUCOPHAGE) 500 MG tablet Take 1 tablet (500 mg total) by mouth daily with breakfast. (Patient not taking: Reported on 10/28/2021) 30 tablet 3    predniSONE (DELTASONE) 5 MG tablet Take 1 tablet (5 mg total) by mouth daily with breakfast. (Patient not taking: Reported on 10/28/2021) 30 tablet 0    QUEtiapine (SEROQUEL) 50 MG tablet Take 2 tablets (100 mg total) by mouth at bedtime. (Patient not taking: Reported on 10/28/2021) 30 tablet 3     Patient Stressors: Financial difficulties   Health problems   Marital or family conflict   Substance abuse   Other: "Certain people in the society"    Patient Strengths: Capable of independent living  General fund of knowledge   Treatment Modalities: Medication Management, Group therapy, Case management,  1 to 1 session with clinician, Psychoeducation, Recreational therapy.   Physician Treatment Plan for Primary Diagnosis: Schizoaffective disorder ([place]) Long Term Goal(s): Improvement in symptoms so as ready for discharge   Short Term Goals: Ability to identify changes in lifestyle to reduce recurrence of condition will improve Ability to verbalize feelings will improve Ability to disclose and discuss suicidal ideas Ability to demonstrate self-control will improve Ability to identify and develop effective coping behaviors will improve Ability to identify triggers associated with substance abuse/mental health issues will improve  Medication Management: Evaluate patient's response, side  effects, and tolerance of medication regimen.  Therapeutic Interventions: 1 to 1 sessions, Unit Group sessions and Medication administration.  Evaluation of Outcomes: Progressing  Physician Treatment Plan for Secondary Diagnosis: Principal Problem:   Schizoaffective disorder ([place]) Active Problems:   Schizoaffective disorder, depressive type ([facility])   Tobacco use disorder   Polysubstance dependence ([place])   Right hand weakness   Neuropathy of hand, right  Long Term Goal(s): Improvement in symptoms so as ready for discharge   Short Term Goals: Ability to identify changes in lifestyle to reduce recurrence of condition will improve Ability to verbalize feelings will improve Ability to disclose and  discuss suicidal ideas Ability to demonstrate self-control will improve Ability to identify and develop effective coping behaviors will improve Ability to identify triggers associated with substance abuse/mental health issues will improve     Medication Management: Evaluate patient's response, side effects, and tolerance of medication regimen.  Therapeutic Interventions: 1 to 1 sessions, Unit Group sessions and Medication administration.  Evaluation of Outcomes: Progressing   RN Treatment Plan for Primary Diagnosis: Schizoaffective disorder ([place]) Long Term Goal(s): Knowledge of disease and therapeutic regimen to maintain health will improve  Short Term Goals: Ability to remain free from injury will improve, Ability to verbalize frustration and anger appropriately will improve, Ability to demonstrate self-control, Ability to identify and develop effective coping behaviors will improve, and Compliance with prescribed medications will improve  Medication Management: RN will administer medications as ordered by provider, will assess and evaluate patient's response and provide education to patient for prescribed medication. RN will report any adverse and/or side effects to prescribing  provider.  Therapeutic Interventions: 1 on 1 counseling sessions, Psychoeducation, Medication administration, Evaluate responses to treatment, Monitor vital signs and CBGs as ordered, Perform/monitor CIWA, COWS, AIMS and Fall Risk screenings as ordered, Perform wound care treatments as ordered.  Evaluation of Outcomes: Progressing   LCSW Treatment Plan for Primary Diagnosis: Schizoaffective disorder ([place]) Long Term Goal(s): Safe transition to appropriate next level of care at discharge, Engage patient in therapeutic group addressing interpersonal concerns.  Short Term Goals: Engage patient in aftercare planning with referrals and resources, Increase social support, Increase ability to appropriately verbalize feelings, Identify triggers associated with mental health/substance abuse issues, and Increase skills for wellness and recovery  Therapeutic Interventions: Assess for all discharge needs, 1 to 1 time with Social worker, Explore available resources and support systems, Assess for adequacy in community support network, Educate family and significant other(s) on suicide prevention, Complete Psychosocial Assessment, Interpersonal group therapy.  Evaluation of Outcomes: Progressing   Progress in Treatment: Attending groups: Yes. Participating in groups: Yes. Taking medication as prescribed: Yes. Toleration medication: Yes. Family/Significant other contact made:  patient declined consents Patient understands diagnosis: Yes. Discussing patient identified problems/goals with staff: Yes. Medical problems stabilized or resolved: Yes. Denies suicidal/homicidal ideation: Yes. Issues/concerns per patient self-inventory: No. Other: none   New problem(s) identified: No, Describe:  none   New Short Term/Long Term Goal(s):     medication stabilization, elimination of SI thoughts, development of comprehensive mental wellness plan.      Patient Goals: "stop racing thoughts"   Discharge Plan  or Barriers: Patient is to discharge to [facility]   Reason for Continuation of Hospitalization: depression, anxiety   Estimated Length of Stay: 1 day   Scribe for Treatment Team: Vassie Moselle, LCSW 11/09/2021 11:35 AM

## 2021-11-09 NOTE — Progress Notes (Addendum)
Patient somewhat agitated this afternoon- requesting to be discharged tonight. Explained to pt that he just had a change in his medication, and that it would be in his best interest to be observed another night.Pt offered a prn med for his anxiety, but pt refused.  ?

## 2021-11-09 NOTE — Progress Notes (Signed)
Adult Psychoeducational Group Note ? ?Date:  11/09/2021 ?Time:  8:52 PM ? ?Group Topic/Focus:  ?Wrap-Up Group:   The focus of this group is to help patients review their daily goal of treatment and discuss progress on daily workbooks. ? ?Participation Level:  Minimal ? ?Participation Quality:  Appropriate ? ?Affect:  Depressed and Irritable ? ?Cognitive:  Disorganized and Confused ? ?Insight: Limited ? ?Engagement in Group:  Limited ? ?Modes of Intervention:  Discussion ? ?Additional Comments:   Pt stated his goal for today was to focus on his treatment plan. Pt stated he accomplished his goal today. Pt stated he talked with his doctor and social worker about his care today. Pt rated his overall day a 6 out of 10. Pt stated he is ready for discharge. Writer encourage pt to speak with his doctor in the morning. Pt stated he was able to contact his mother today which improved his overall day. Pt stated his mother coming for visitation tonight improved his overall night. Pt stated he did not feel to good about yourself tonight. Pt stated he was able to attend all meals. Pt stated he took all medications provided today.  Pt stated his appetite was good today. Pt rated sleep last night was pretty good. Pt stated the goal tonight was to get some rest. Pt stated he had some physical pain tonight. Pt stated he had some moderate pain in his right hand. Pt rate the moderate pain in his right hand a 7 on the pain level scale. Pt admitted to dealing with visual hallucinations and auditory issues tonight.Pt nurse was updated on the situation.  Pt denies thoughts of harming others. Pt admitted to having thought of harming himself. Pt stated he could contract for safety. Pt nurse was updated on the situation. Pt stated he would alert staff if anything changed. ? ?Candy Sledge ?11/09/2021, 8:52 PM ?

## 2021-11-09 NOTE — Group Note (Signed)
Recreation Therapy Group Note ? ? ?Group Topic:Coping Skills  ?Group Date: 11/09/2021 ?Start Time: 1000 ?End Time: 4696 ?Facilitators: Victorino Sparrow, LRT,CTRS ?Location: [facility] ? ? ?Goal Area(s) Addresses:  ?Patient will identify positive coping skills. ?Patient will identify benefits of using coping skills post d/c. ? ?Group Description:  Mind Map.  Patient was provided a blank template of a diagram with 32 blank boxes in a tiered system, branching from the center (similar to a bubble chart). LRT directed patients to label the middle of the diagram "Coping Skills" and consider 8 different challenges that would require coping skills.  Patients and LRT filled in the 2nd tier of boxes with the challenges that were identified (anxiety, depression, anger, stress, happy, suicidal thoughts, gestures and finances).  Patients were to then come up with 3 coping skills that could be used for each.  When patients finished, LRT would write the coping skills on the board and patients could fill in any blank spots they may have had on their sheets.   ? ? ?Affect/Mood: Appropriate ?  ?Participation Level: Engaged ?  ?Participation Quality: Independent ?  ?Behavior: Appropriate ?  ?Speech/Thought Process: Focused ?  ?Insight: Good ?  ?Judgement: Good ?  ?Modes of Intervention: Worksheet ?  ?Patient Response to Interventions:  Engaged ?  ?Education Outcome: ? Acknowledges education and In group clarification offered   ? ?Clinical Observations/Individualized Feedback: Pt was appropriate and engaged well during group session.  Pt was able to come with coping skills like cleaning, smiling/laughing, stretching, pet therapy, keep a job and counseling.   ? ? ?Plan: Continue to engage patient in RT group sessions 2-3x/week. ? ? ?Victorino Sparrow, LRT,CTRS ?11/09/2021 12:40 PM ?

## 2021-11-10 LAB — CBC WITH DIFFERENTIAL/PLATELET
Abs Immature Granulocytes: 0.03 10*3/uL (ref 0.00–0.07)
Basophils Absolute: 0.1 10*3/uL (ref 0.0–0.1)
Basophils Relative: 1 %
Eosinophils Absolute: 0.3 10*3/uL (ref 0.0–0.5)
Eosinophils Relative: 4 %
HCT: 41.7 % (ref 39.0–52.0)
Hemoglobin: 12.9 g/dL — ABNORMAL LOW (ref 13.0–17.0)
Immature Granulocytes: 0 %
Lymphocytes Relative: 31 %
Lymphs Abs: 2.6 10*3/uL (ref 0.7–4.0)
MCH: 23.6 pg — ABNORMAL LOW (ref 26.0–34.0)
MCHC: 30.9 g/dL (ref 30.0–36.0)
MCV: 76.2 fL — ABNORMAL LOW (ref 80.0–100.0)
Monocytes Absolute: 0.8 10*3/uL (ref 0.1–1.0)
Monocytes Relative: 10 %
Neutro Abs: 4.4 10*3/uL (ref 1.7–7.7)
Neutrophils Relative %: 54 %
Platelets: 242 10*3/uL (ref 150–400)
RBC: 5.47 MIL/uL (ref 4.22–5.81)
RDW: 15.9 % — ABNORMAL HIGH (ref 11.5–15.5)
WBC: 8.2 10*3/uL (ref 4.0–10.5)
nRBC: 0 % (ref 0.0–0.2)

## 2021-11-10 LAB — COMPREHENSIVE METABOLIC PANEL
ALT: 18 U/L (ref 0–44)
AST: 18 U/L (ref 15–41)
Albumin: 3.7 g/dL (ref 3.5–5.0)
Alkaline Phosphatase: 59 U/L (ref 38–126)
Anion gap: 9 (ref 5–15)
BUN: 12 mg/dL (ref 6–20)
CO2: 24 mmol/L (ref 22–32)
Calcium: 8.8 mg/dL — ABNORMAL LOW (ref 8.9–10.3)
Chloride: 105 mmol/L (ref 98–111)
Creatinine, Ser: 0.73 mg/dL (ref 0.61–1.24)
GFR, Estimated: >60 mL/min (ref >60)
Glucose, Bld: 146 mg/dL — ABNORMAL HIGH (ref 70–99)
Potassium: 3.8 mmol/L (ref 3.5–5.1)
Sodium: 138 mmol/L (ref 135–145)
Total Bilirubin: 0.1 mg/dL — ABNORMAL LOW (ref 0.3–1.2)
Total Protein: 6.7 g/dL (ref 6.5–8.1)

## 2021-11-10 LAB — AMMONIA: Ammonia: 44 umol/L — ABNORMAL HIGH (ref 9–35)

## 2021-11-10 LAB — VALPROIC ACID LEVEL: Valproic Acid Lvl: 99 ug/mL (ref 50.0–100.0)

## 2021-11-10 NOTE — Progress Notes (Signed)
Pt visible on the e unit much of the evening, pt stated he was doing better and pt appeared much clearer with his affect and speech ? ? ? 11/10/21 2200  ?Psych Admission Type (Psych Patients Only)  ?Admission Status Voluntary  ?Psychosocial Assessment  ?Patient Complaints Anxiety  ?Eye Contact Fair  ?Facial Expression Anxious  ?Affect Preoccupied  ?Speech Logical/coherent  ?Interaction Assertive  ?Motor Activity Slow  ?Appearance/Hygiene Unremarkable  ?Behavior Characteristics Cooperative  ?Mood Anxious;Pleasant  ?Aggressive Behavior  ?Effect No apparent injury  ?Thought Process  ?Coherency Circumstantial  ?Content WDL  ?Delusions WDL  ?Perception Hallucinations  ?Hallucination Auditory  ?Judgment Impaired  ?Confusion None  ?Danger to Self  ?Current suicidal ideation? Denies  ?Danger to Others  ?Danger to Others None reported or observed  ? ? ?

## 2021-11-10 NOTE — Progress Notes (Signed)
The focus of this group is to help patients review their daily goal of treatment and discuss progress on daily workbooks. ? ?Pt attended the evening group and responded to all discussion prompts from the [place]. Pt shared that today was a good day on the unit, the highlight of which was a visit from a good friend. ? ?Pt told that their goal for the coming week was to catch up on their college coursework, which they were set to do. Pt explained that he had a very understanding teacher who would work with him. ? ?Pt rated his day an 8 out of 10 and his affect was appropriate. ?

## 2021-11-10 NOTE — Progress Notes (Signed)
?   11/10/21 0946  ?Psych Admission Type (Psych Patients Only)  ?Admission Status Voluntary  ?Psychosocial Assessment  ?Patient Complaints Anxiety  ?Eye Contact Brief  ?Facial Expression Anxious  ?Affect Irritable  ?Speech Logical/coherent  ?Interaction Assertive  ?Motor Activity Slow  ?Appearance/Hygiene Unremarkable  ?Behavior Characteristics Anxious;Cooperative  ?Mood Anxious  ?Thought Process  ?Coherency Circumstantial  ?Content WDL  ?Delusions None reported or observed  ?Perception Hallucinations  ?Hallucination None reported or observed  ?Judgment Impaired  ?Confusion None  ?Danger to Self  ?Current suicidal ideation? Denies  ?Self-Injurious Behavior No self-injurious ideation or behavior indicators observed or expressed   ?Agreement Not to Harm Self Yes  ?Description of Agreement verbally co  ?Danger to Others  ?Danger to Others None reported or observed  ?Danger to Others Abnormal  ?Harmful Behavior to others No threats or harm toward other people  ?Destructive Behavior No threats or harm toward property  ? ? ?

## 2021-11-10 NOTE — Group Note (Signed)
Recreation Therapy Group Note ? ? ?Group Topic:Self-Esteem  ?Group Date: 11/10/2021 ?Start Time: 1000 ?End Time: 1100 ?Facilitators: Victorino Sparrow, LRT, CTRS ?Location: [place] ? ? ?Goal Area(s) Addresses:  ?Patient will successfully identify positive attributes about themselves.  ?Patient will identify healthy ways to increase self-esteem. ?Patient will acknowledge benefit(s) of improved self-esteem.  ? ?Group Description:  Collage.  LRT and patients discussed the importance of positive self esteem and why it's important in day to day life.  Patients were then instructed to create collages that highlighted their positive attributes along with things that inspire them, things they have accomplished or things they hope to accomplish.  Patients were given 30 minutes the create collages with supplies available (construction paper, markers, glue sticks, safety scissors and magazines) before sharing with the group. ? ? ?Affect/Mood: Appropriate ?  ?Participation Level: Engaged ?  ?Participation Quality: Independent ?  ?Behavior: Appropriate ?  ?Speech/Thought Process: Focused ?  ?Insight: Good ?  ?Judgement: Good ?  ?Modes of Intervention: Art ?  ?Patient Response to Interventions:  Engaged ?  ?Education Outcome: ? Acknowledges education and In group clarification offered   ? ?Clinical Observations/Individualized Feedback: Pt was appropriate during group session.  Pt struggled using the scissors because his hands were so big.  As a result, pt struggled to complete project the way he wanted to.  Pt did find the quotes "expected the unexpected" and "capture life one moment at a time".  Pt expressed the first quoted could used for good or bad but he defined it as "stay prepared".  Pt went on to explain you never know what kind of situation you will face so it's always good to be ready for anything.  The second quote pt stated it meant to "see the beauty in each moment".  Pt further explained not wanting to miss  things because we are so in a hurry so enjoy it.  ? ? ?Plan: Continue to engage patient in RT group sessions 2-3x/week. ? ? ?Victorino Sparrow, LRT,CTRS ?11/10/2021 11:38 AM ?

## 2021-11-10 NOTE — Plan of Care (Signed)
?  Problem: Activity: ?Goal: Interest or engagement in activities will improve ?Outcome: Progressing ?  ?Problem: Coping: ?Goal: Ability to verbalize frustrations and anger appropriately will improve ?Outcome: Progressing ?  ?Problem: Coping: ?Goal: Ability to demonstrate self-control will improve ?Outcome: Progressing ?  ?Problem: Safety: ?Goal: Periods of time without injury will increase ?Outcome: Progressing ?  ?Problem: Role Relationship: ?Goal: Ability to communicate needs accurately will improve ?Outcome: Progressing ?  ?Problem: Safety: ?Goal: Ability to redirect hostility and anger into socially appropriate behaviors will improve ?Outcome: Progressing ?  ?

## 2021-11-10 NOTE — Progress Notes (Signed)
[facility] MD Progress Note ? ?11/10/2021 4:30 PM ?Duane Salazar  ?MRN:  [id number] ?Subjective:   ?Duane Salazar is a 46 yr old male who presented on 3/1 to [facility] for worsening depression and a Suicide Attempt via OD (Meth), he was admitted to [facility] on 3/3.  PPHx is significant for Schizoaffective Disorder, Anxiety, 1 previous Suicide Attempt (jump off bridge), and ~10 previous hospitalizations (latest [facility] 2017). ? ? ?Case was discussed in the multidisciplinary team. MAR was reviewed and patient was mostly compliant with medications.  He received PRN Atarax and Trazodone yesterday. ? ? ?Psychiatric Team made the following recommendations yesterday: ?-Continue Thorazine 37.5 mg at 8 AM, 37.5 mg at noon, and 50 mg at bedtime -for psychosis, mood stabilization, and impulse control ?-Continue Cogentin 0.5 mg p.o. twice daily ?-Continue Depakote 750 mg p.o. twice daily - for mood stabilization ?-Continue gabapentin to 400 mg p.o. 3 times daily-for anxiety and neuropathic pain ?-Decrease Risperdal to 0.5 mg AM and 1 mg PM for hallucinations.   ?-Continue propranolol 10 mg 3 times daily - for anxiety  ?-Continue losartan to 50 mg once daily  ?-Continue amlodipine 5 mg once daily  ?-Stop Lactulose due to fluid loss. ? ? ?On interview today patient reports his appetite is doing good.  He reports his sleep is doing good.  He reports continued chronic passive SI but that it is improved from yesterday.  He reports no HI today.  He reports no AVH.  He reports no [place], Ideas of Reference, or other First Rank symptoms. ? ?He reports that the wooziness he reported yesterday has improved somewhat with the decrease in his morning Risperdal dose and increasing fluids.  He states that today he was able to walk around the unit some and attend groups because of the improvement.  Discussed with him that we plan for discharge tomorrow and that social work will come and speak with him in more detail about that later today.  He reports no other  concerns at present. ? ? ?Principal Problem: Schizoaffective disorder ([place]) ?Diagnosis: Principal Problem: ?  Schizoaffective disorder ([place]) ?Active Problems: ?  Schizoaffective disorder, depressive type ([place]) ?  Tobacco use disorder ?  Polysubstance dependence ([place]) ?  Right hand weakness ?  Neuropathy of hand, right ? ?Total Time spent with patient:  ?I personally spent 30 minutes on the unit in direct patient care. The direct patient care time included face-to-face time with the patient, reviewing the patient's chart, communicating with other professionals, and coordinating care. Greater than 50% of this time was spent in counseling or coordinating care with the patient regarding goals of hospitalization, psycho-education, and discharge planning needs. ? ? ?Past Psychiatric History: Schizoaffective Disorder, Anxiety, 1 previous Suicide Attempt (jump off bridge), and ~10 previous hospitalizations (latest [facility] 2017). ? ?Past Medical History:  ?Past Medical History:  ?Diagnosis Date  ? 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 AKI (acute kidney injury) ([place]) 03/21/2015  ? Arthritis Dx 2002  ? Diabetes type 2, controlled ([place]) 2010  ? Drug abuse ([place])   ? Headache   ? Hypertension Dx 2002  ? 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 Schizo affective schizophrenia ([place]) 1995  ? Schizophrenia ([place])   ? Seizures ([place])   ? Sleep apnea 1995  ? on CPAP  ?  ?Past Surgical History:  ?Procedure Laterality Date  ? PITUITARY SURGERY  2005 & 2012  ? SKIN BIOPSY    ? ?Family History:  ?Family History  ?Problem Relation Age of Onset  ?  Hypertension Mother   ? Diabetes Mother   ? Heart Problems Mother   ? Cancer Maternal Uncle   ? Alcoholism Maternal Uncle   ? Cancer Maternal Grandmother   ? Heart disease Neg Hx   ? ?Family Psychiatric  History: No Known Diagnosis' or Suicides. ?Social History:  ?Social History  ? ?Substance and Sexual Activity  ?Alcohol Use Yes  ? Alcohol/week: 2.0 standard drinks  ? Types: 2 Cans of beer per week  ? Comment: 2 cans on  workdays; 6-pk on days off; varies  ?   ?Social History  ? ?Substance and Sexual Activity  ?Drug Use Yes  ? Types: Cocaine, Marijuana, Heroin  ? Comment: Reports use 2 grams Cocaine 10/28/21  ?  ?Social History  ? ?Socioeconomic History  ? Marital status: Single  ?  Spouse name: Not on file  ? Number of children: 2  ? Years of education: GED  ? Highest education level: Not on file  ?Occupational History  ? Not on file  ?Tobacco Use  ? Smoking status: Every Day  ?  Packs/day: 1.00  ?  Years: 20.00  ?  Pack years: 20.00  ?  Types: Cigarettes  ? Smokeless tobacco: Never  ? Tobacco comments:  ?  Smoking .5 ppd  ?Substance and Sexual Activity  ? Alcohol use: Yes  ?  Alcohol/week: 2.0 standard drinks  ?  Types: 2 Cans of beer per week  ?  Comment: 2 cans on workdays; 6-pk on days off; varies  ? Drug use: Yes  ?  Types: Cocaine, Marijuana, Heroin  ?  Comment: Reports use 2 grams Cocaine 10/28/21  ? Sexual activity: Yes  ?Other Topics Concern  ? Not on file  ?Social History Narrative  ? Lives with mom.  ? Incarcerated for 22 months in [place], [place]. From 2014-09/2014  ? ?Social Determinants of Health  ? ?Financial Resource Strain: Not on file  ?Food Insecurity: Not on file  ?Transportation Needs: Not on file  ?Physical Activity: Not on file  ?Stress: Not on file  ?Social Connections: Not on file  ? ?Additional Social History:  ?  ?  ?  ?  ?  ?  ?  ?  ?  ?  ?  ? ?Sleep: Good ? ?Appetite:  Good ? ?Current Medications: ?Current Facility-Administered Medications  ?Medication Dose Route Frequency Provider Last Rate Last Admin  ? acetaminophen (TYLENOL) tablet 650 mg  650 mg Oral Q6H PRN White, Patrice L, NP   650 mg at 11/08/21 0806  ? alum & mag hydroxide-simeth (MAALOX/MYLANTA) 200-200-20 MG/5ML suspension 30 mL  30 mL Oral Q4H PRN White, Patrice L, NP      ? amLODipine (NORVASC) tablet 5 mg  5 mg Oral Daily Massengill, Nathan, MD   5 mg at 11/10/21 0755  ? benztropine (COGENTIN) tablet 0.5 mg  0.5 mg Oral BID Massengill, Ovid Curd,  MD   0.5 mg at 11/10/21 0754  ? celecoxib (CELEBREX) capsule 200 mg  200 mg Oral Daily White, Patrice L, NP   200 mg at 11/10/21 0755  ? chlorproMAZINE (THORAZINE) tablet 37.5 mg  37.5 mg Oral BID Massengill, Ovid Curd, MD   37.5 mg at 11/10/21 1127  ? chlorproMAZINE (THORAZINE) tablet 50 mg  50 mg Oral QHS Janine Limbo, MD   50 mg at 11/09/21 2048  ? divalproex (DEPAKOTE) DR tablet 750 mg  750 mg Oral BID Briant Cedar, MD   750 mg at 11/10/21 0754  ? feeding supplement (ENSURE  ENLIVE / ENSURE PLUS) liquid 237 mL  237 mL Oral Q24H Nelda Marseille, Amy E, MD   237 mL at 11/10/21 1124  ? fluticasone (FLONASE) 50 MCG/ACT nasal spray 1 spray  1 spray Each Nare Daily PRN Maida Sale, MD      ? gabapentin (NEURONTIN) capsule 400 mg  400 mg Oral TID Janine Limbo, MD   400 mg at 11/10/21 1127  ? hydrOXYzine (ATARAX) tablet 25 mg  25 mg Oral TID PRN Janine Limbo, MD   25 mg at 11/09/21 1146  ? OLANZapine zydis (ZYPREXA) disintegrating tablet 5 mg  5 mg Oral Q6H PRN Janine Limbo, MD   5 mg at 11/01/21 2130  ? And  ? LORazepam (ATIVAN) tablet 1 mg  1 mg Oral Q6H PRN Massengill, Ovid Curd, MD      ? And  ? ziprasidone (GEODON) injection 20 mg  20 mg Intramuscular Q6H PRN Massengill, Nathan, MD      ? losartan (COZAAR) tablet 50 mg  50 mg Oral Daily Massengill, Nathan, MD   50 mg at 11/10/21 2706  ? magnesium hydroxide (MILK OF MAGNESIA) suspension 30 mL  30 mL Oral Daily PRN White, Patrice L, NP      ? menthol-cetylpyridinium (CEPACOL) lozenge 3 mg  1 lozenge Oral PRN Janine Limbo, MD   3 mg at 11/04/21 1150  ? metFORMIN (GLUCOPHAGE) tablet 500 mg  500 mg Oral Q breakfast White, Patrice L, NP   500 mg at 11/10/21 0755  ? neomycin-bacitracin-polymyxin (NEOSPORIN) ointment   Topical PRN Harlow Asa, MD      ? predniSONE (DELTASONE) tablet 5 mg  5 mg Oral Q breakfast White, Patrice L, NP   5 mg at 11/10/21 0755  ? propranolol (INDERAL) tablet 10 mg  10 mg Oral TID Janine Limbo, MD    10 mg at 11/10/21 1127  ? risperiDONE (RISPERDAL) tablet 0.5 mg  0.5 mg Oral Daily Briant Cedar, MD   0.5 mg at 11/10/21 0754  ? risperiDONE (RISPERDAL) tablet 1 mg  1 mg Oral QHS Eri Mcevers

## 2021-11-11 MED ORDER — GABAPENTIN 400 MG PO CAPS: 400.0000 mg | ORAL_CAPSULE | Freq: Three times a day (TID) | ORAL | 0 refills | Status: DC

## 2021-11-11 MED ORDER — BACITRACIN-NEOMYCIN-POLYMYXIN OINTMENT TUBE: 1.0000 "application " | TOPICAL_OINTMENT | CUTANEOUS | Status: DC | PRN

## 2021-11-11 MED ORDER — HYDROXYZINE HCL 25 MG PO TABS: 25.0000 mg | ORAL_TABLET | Freq: Three times a day (TID) | ORAL | 0 refills | Status: DC | PRN

## 2021-11-11 MED ORDER — DIVALPROEX SODIUM 250 MG PO DR TAB
750.0000 mg | DELAYED_RELEASE_TABLET | Freq: Every day | ORAL | Status: DC
  Filled 2021-11-11: qty 21

## 2021-11-11 MED ORDER — CHLORPROMAZINE HCL 50 MG PO TABS
50.0000 mg | ORAL_TABLET | Freq: Every day | ORAL | 0 refills | Status: DC
  Filled 2021-11-16: qty 30, 30d supply, fill #0

## 2021-11-11 MED ORDER — AMLODIPINE BESYLATE 5 MG PO TABS
5.0000 mg | ORAL_TABLET | Freq: Every day | ORAL | 0 refills | Status: DC
  Filled 2021-11-16 – 2021-12-08 (×2): qty 30, 30d supply, fill #0

## 2021-11-11 MED ORDER — CELECOXIB 200 MG PO CAPS: 200.0000 mg | ORAL_CAPSULE | Freq: Every day | ORAL | 0 refills | Status: DC

## 2021-11-11 MED ORDER — PROPRANOLOL HCL 10 MG PO TABS: 10.0000 mg | ORAL_TABLET | Freq: Three times a day (TID) | ORAL | 0 refills | Status: DC

## 2021-11-11 MED ORDER — TRAZODONE HCL 100 MG PO TABS
100.0000 mg | ORAL_TABLET | Freq: Every evening | ORAL | 0 refills | Status: DC | PRN
  Filled 2021-11-16: qty 30, 30d supply, fill #0

## 2021-11-11 MED ORDER — MENTHOL 3 MG MT LOZG: 1.0000 | LOZENGE | OROMUCOSAL | 0 refills | Status: DC | PRN

## 2021-11-11 MED ORDER — CHLORPROMAZINE HCL 25 MG PO TABS
37.5000 mg | ORAL_TABLET | Freq: Two times a day (BID) | ORAL | 0 refills | Status: DC
  Filled 2021-11-16: qty 90, 30d supply, fill #0

## 2021-11-11 MED ORDER — PREDNISONE 5 MG PO TABS: 5.0000 mg | ORAL_TABLET | Freq: Every day | ORAL | 0 refills | Status: DC

## 2021-11-11 MED ORDER — BENZTROPINE MESYLATE 0.5 MG PO TABS
0.5000 mg | ORAL_TABLET | Freq: Two times a day (BID) | ORAL | 0 refills | Status: DC
  Filled 2021-11-16 – 2021-12-08 (×2): qty 60, 30d supply, fill #0

## 2021-11-11 MED ORDER — METFORMIN HCL 500 MG PO TABS: 500.0000 mg | ORAL_TABLET | Freq: Every day | ORAL | 0 refills | Status: DC

## 2021-11-11 MED ORDER — LOSARTAN POTASSIUM 50 MG PO TABS: 50.0000 mg | ORAL_TABLET | Freq: Every day | ORAL | 0 refills | Status: DC

## 2021-11-11 MED ORDER — GABAPENTIN 400 MG PO CAPS
400.0000 mg | ORAL_CAPSULE | Freq: Three times a day (TID) | ORAL | 0 refills | Status: DC
  Filled 2021-11-16 – 2021-12-08 (×2): qty 90, 30d supply, fill #0

## 2021-11-11 MED ORDER — RISPERIDONE 1 MG PO TABS: 1.0000 mg | ORAL_TABLET | Freq: Every day | ORAL | 0 refills | Status: DC

## 2021-11-11 MED ORDER — DIVALPROEX SODIUM 250 MG PO DR TAB: 750.0000 mg | DELAYED_RELEASE_TABLET | Freq: Two times a day (BID) | ORAL | 0 refills | Status: DC

## 2021-11-11 MED ORDER — LOSARTAN POTASSIUM 50 MG PO TABS
50.0000 mg | ORAL_TABLET | Freq: Every day | ORAL | 0 refills | Status: DC
  Filled 2021-11-16 – 2021-12-08 (×2): qty 30, 30d supply, fill #0

## 2021-11-11 MED ORDER — RISPERIDONE 1 MG PO TABS
1.0000 mg | ORAL_TABLET | Freq: Every day | ORAL | 0 refills | Status: DC
  Filled 2021-11-16: qty 30, 30d supply, fill #0

## 2021-11-11 MED ORDER — PROPRANOLOL HCL 10 MG PO TABS
10.0000 mg | ORAL_TABLET | Freq: Three times a day (TID) | ORAL | 0 refills | Status: DC
  Filled 2021-11-16: qty 90, 30d supply, fill #0

## 2021-11-11 MED ORDER — DIVALPROEX SODIUM 250 MG PO DR TAB
750.0000 mg | DELAYED_RELEASE_TABLET | Freq: Two times a day (BID) | ORAL | 0 refills | Status: DC
  Filled 2021-11-16: qty 180, 30d supply, fill #0

## 2021-11-11 MED ORDER — DIVALPROEX SODIUM 250 MG PO DR TAB
750.0000 mg | DELAYED_RELEASE_TABLET | Freq: Two times a day (BID) | ORAL | Status: DC
  Filled 2021-11-11 (×3): qty 42

## 2021-11-11 MED ORDER — FLUTICASONE PROPIONATE 50 MCG/ACT NA SUSP
1.0000 | Freq: Every day | NASAL | 0 refills | Status: DC | PRN
  Filled 2021-11-16: qty 16, 30d supply, fill #0

## 2021-11-11 MED ORDER — CHLORPROMAZINE HCL 50 MG PO TABS
50.0000 mg | ORAL_TABLET | Freq: Every day | ORAL | 0 refills | Status: DC
Start: 2021-11-11 — End: 2021-11-11

## 2021-11-11 MED ORDER — BENZTROPINE MESYLATE 0.5 MG PO TABS: 0.5000 mg | ORAL_TABLET | Freq: Two times a day (BID) | ORAL | 0 refills | Status: DC

## 2021-11-11 MED ORDER — RISPERIDONE 0.5 MG PO TABS: 0.5000 mg | ORAL_TABLET | Freq: Every day | ORAL | 0 refills | Status: DC

## 2021-11-11 MED ORDER — CHLORPROMAZINE HCL 25 MG PO TABS: 37.5000 mg | ORAL_TABLET | Freq: Two times a day (BID) | ORAL | 0 refills | Status: DC

## 2021-11-11 MED ORDER — TRAZODONE HCL 100 MG PO TABS: 100.0000 mg | ORAL_TABLET | Freq: Every evening | ORAL | 0 refills | Status: DC | PRN

## 2021-11-11 MED ORDER — HYDROXYZINE HCL 25 MG PO TABS
25.0000 mg | ORAL_TABLET | Freq: Three times a day (TID) | ORAL | 0 refills | Status: DC | PRN
  Filled 2021-11-16: qty 30, 10d supply, fill #0

## 2021-11-11 MED ORDER — AMLODIPINE BESYLATE 5 MG PO TABS: 5.0000 mg | ORAL_TABLET | Freq: Every day | ORAL | 0 refills | Status: DC

## 2021-11-11 MED ORDER — RISPERIDONE 0.5 MG PO TABS
0.5000 mg | ORAL_TABLET | Freq: Every day | ORAL | 0 refills | Status: DC
  Filled 2021-11-16: qty 30, 30d supply, fill #0

## 2021-11-11 MED ORDER — FLUTICASONE PROPIONATE 50 MCG/ACT NA SUSP: 1.0000 | Freq: Every day | NASAL | 0 refills | Status: DC | PRN

## 2021-11-11 NOTE — Progress Notes (Signed)
Discharge Note:  Patient discharged to [facility] in [place] via taxi.   Suicide prevention information given and discussed with patient who stated he understood and had no questions.  Denied SI and HI.  Denied A/V hallucinations.  Patient stated he received all his belongings, clothing, toiletries.  Patient stated he appreciated all assistance with [facility] staff.  All required discharge information given. ? ?

## 2021-11-11 NOTE — Plan of Care (Signed)
Nurse discussed coping skills with patient.  

## 2021-11-11 NOTE — Discharge Summary (Signed)
Physician Discharge Summary Note ? ?Patient:  Duane Salazar is an 46 y.o., male ?MRN:  [id number] ?DOB:  Apr 10, 1976 ?Patient phone:  [phone] (home)  ?Patient address:   ?[address] ?[place] [address],  ?Total Time spent with patient: 30 minutes ? ?Date of Admission:  10/29/2021 ?Date of Discharge: 11/11/2021 ? ?Reason for Admission:   ?Duane Salazar is a 46 yr old male who presented on 3/1 to [facility] for worsening depression and a Suicide Attempt via OD (Meth), he was admitted to [facility] on 3/3.  PPHx is significant for Schizoaffective Disorder, Anxiety, 1 previous Suicide Attempt (jump off bridge), and ~10 previous hospitalizations (latest [facility] 2017). ?  ?He reports that he has been having issues for the past 4 to 6 months.  He states that his SI started about 2 months ago.  He states that he has been wanting to overdose and also becoming more aggressive swinging at people.  He states that he has been hospitalized several times before and in the past he would lie slightly quicker and say his symptoms were under control by the medication from his wife.  He states that when he was in prison he could just tell them whatever symptoms he wanted to get whatever diagnosis he wanted but at this time he wants to be accurately diagnosed.  When asked if he was glad that he did not die in his overdose attempt on methamphetamine he states 50/50. ? ?Principal Problem: Schizoaffective disorder ([place]) ?Discharge Diagnoses: Principal Problem: ?  Schizoaffective disorder ([place]) ?Active Problems: ?  Schizoaffective disorder, depressive type ([place]) ?  Tobacco use disorder ?  Polysubstance dependence ([place]) ?  Right hand weakness ?  Neuropathy of hand, right ? ? ?Past Psychiatric History: Schizoaffective Disorder, Anxiety, 1 previous Suicide Attempt (jump off bridge), and ~10 previous hospitalizations (latest [facility] 2017). ? ?Past Medical History:  ?Past Medical History:  ?Diagnosis Date  ? 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 AKI (acute kidney injury)  ([place]) 03/21/2015  ? Arthritis Dx 2002  ? Diabetes type 2, controlled ([place]) 2010  ? Drug abuse ([place])   ? Headache   ? Hypertension Dx 2002  ? 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 Schizo affective schizophrenia ([place]) 1995  ? Schizophrenia ([place])   ? Seizures ([place])   ? Sleep apnea 1995  ? on CPAP  ?  ?Past Surgical History:  ?Procedure Laterality Date  ? PITUITARY SURGERY  2005 & 2012  ? SKIN BIOPSY    ? ?Family History:  ?Family History  ?Problem Relation Age of Onset  ? Hypertension Mother   ? Diabetes Mother   ? Heart Problems Mother   ? Cancer Maternal Uncle   ? Alcoholism Maternal Uncle   ? Cancer Maternal Grandmother   ? Heart disease Neg Hx   ? ?Family Psychiatric  History: No Known Diagnosis' or Suicides. ?Social History:  ?Social History  ? ?Substance and Sexual Activity  ?Alcohol Use Yes  ? Alcohol/week: 2.0 standard drinks  ? Types: 2 Cans of beer per week  ? Comment: 2 cans on workdays; 6-pk on days off; varies  ?   ?Social History  ? ?Substance and Sexual Activity  ?Drug Use Yes  ? Types: Cocaine, Marijuana, Heroin  ? Comment: Reports use 2 grams Cocaine 10/28/21  ?  ?Social History  ? ?Socioeconomic History  ? Marital status: Single  ?  Spouse name: Not on file  ? Number of children: 2  ? Years of education: GED  ? Highest education level:  Not on file  ?Occupational History  ? Not on file  ?Tobacco Use  ? Smoking status: Every Day  ?  Packs/day: 1.00  ?  Years: 20.00  ?  Pack years: 20.00  ?  Types: Cigarettes  ? Smokeless tobacco: Never  ? Tobacco comments:  ?  Smoking .5 ppd  ?Substance and Sexual Activity  ? Alcohol use: Yes  ?  Alcohol/week: 2.0 standard drinks  ?  Types: 2 Cans of beer per week  ?  Comment: 2 cans on workdays; 6-pk on days off; varies  ? Drug use: Yes  ?  Types: Cocaine, Marijuana, Heroin  ?  Comment: Reports use 2 grams Cocaine 10/28/21  ? Sexual activity: Yes  ?Other Topics Concern  ? Not on file  ?Social History Narrative  ? Lives with mom.  ? Incarcerated for 22 months in [place],  [place]. From 2014-09/2014  ? ?Social Determinants of Health  ? ?Financial Resource Strain: Not on file  ?Food Insecurity: Not on file  ?Transportation Needs: Not on file  ?Physical Activity: Not on file  ?Stress: Not on file  ?Social Connections: Not on file  ? ? ?Hospital Course:   ?During the patient's hospitalization, patient had extensive initial psychiatric evaluation, and follow-up psychiatric evaluations every day. ? ?Psychiatric diagnoses provided upon initial assessment: Schizoaffective Disorder, depressive type ? ?Patient's psychiatric medications were adjusted on admission: Started on Thorazine, Depakote. Continued on Gabapentin and Cogentin and his chronic medications. ? ?During the hospitalization, other adjustments were made to the patient's psychiatric medication regimen: He was started on Risperdal and Propanolol for better symptoms control.  He did have some elevated Ammonia so had a dose of Lactulose while hospitalized. ? ?Patient's care was discussed during the interdisciplinary team meeting every day during the hospitalization. ? ?The patient is having some fatigue due to prescribed psychiatric medication but otherwise reports no side effects from his medications. ? ?Gradually, patient started adjusting to milieu. The patient was evaluated each day by a clinical provider to ascertain response to treatment. Improvement was noted by the patient's report of decreasing symptoms, improved sleep and appetite, affect, medication tolerance, behavior, and participation in unit programming.  Patient was asked each day to complete a self inventory noting mood, mental status, pain, new symptoms, anxiety and concerns.   ?Symptoms were reported as significantly decreased or resolved completely by discharge.  ?The patient reports that their mood is stable.  ?The patient denied having suicidal thoughts for more than 48 hours prior to discharge.  Patient denies having homicidal thoughts.  Patient denies having  auditory hallucinations.  Patient denies any visual hallucinations or other symptoms of psychosis.  ?The patient was motivated to continue taking medication with a goal of continued improvement in mental health.  ? ?The patient reports their target psychiatric symptoms of depression, SI, and HI responded well to the psychiatric medications, and the patient reports overall benefit other psychiatric hospitalization. Supportive psychotherapy was provided to the patient. The patient also participated in regular group therapy while hospitalized. Coping skills, problem solving as well as relaxation therapies were also part of the unit programming. ? ?Labs were reviewed with the patient, and abnormal results were discussed with the patient. ? ?The patient is able to verbalize their individual safety plan to this provider. ? ?# It is recommended to the patient to continue psychiatric medications as prescribed, after discharge from the hospital.   ? ?# It is recommended to the patient to follow up with your outpatient psychiatric provider  and PCP. ? ?# It was discussed with the patient, the impact of alcohol, drugs, tobacco have been there overall psychiatric and medical wellbeing, and total abstinence from substance use was recommended the patient.ed. ? ?# Prescriptions provided or sent directly to preferred pharmacy at discharge. Patient agreeable to plan. Given opportunity to ask questions. Appears to feel comfortable with discharge.  ?  ?# In the event of worsening symptoms, the patient is instructed to call the crisis hotline, 911 and or go to the nearest ED for appropriate evaluation and treatment of symptoms. To follow-up with primary care provider for other medical issues, concerns and or health care needs ? ?# Patient was discharged to [facility] with a plan to follow up as noted below. ? ? ? ?On day of discharge he reported he was doing good.  He reports he slept well last night.  He reports his  appetite is doing good.  He reports no SI, HI, or AVH.  He reports he is still having some fatigue due to his medications but reports no other side effects.  Discussed with him what to do in the event of a fut

## 2021-11-11 NOTE — Progress Notes (Signed)
D:  Patient's self inventory sheet, patient sleeps good, no sleep medication.  Good appetite, normal energy level, poor concentration.  Rated depression 3, hopeless 4.  Denied withdrawals.  Denied SI.  Physical problems.  Physical pain, worth pain #8.  Goal is concentration.  Goal is stay still.  No discharge plans. ?A:  Medications administered per MD orders.  Emotional support and encouragement given patient. ?R:  Denied SI and HI, contracts for safety.  Denied A/V hallucinations.  Safety maintained with 15 minute checks. ? ?

## 2021-11-11 NOTE — Progress Notes (Signed)
?  [facility] Adult Case Management Discharge Plan : ? ?Will you be returning to the same living situation after discharge:  No. Will be  discharged to [facility] ?At discharge, do you have transportation home?: No. Taxi will be used ?Do you have the ability to pay for your medications: Yes,  has insurance ? ?Release of information consent forms completed and in the chart;  Patient's signature needed at discharge. ? ?Patient to Follow up at: ? Follow-up Information   ? ? [name] Call.   ?Why: For therapy and medication management services, (if you do not have video capability) please walk in, or call us for virtual options, Monday through Friday between 8 a.m. and 3 p.m.  Office is closed for lunch from 12 - 1 pm. ?Contact information: ?[address], [place], [place] [address] ? ?Phone: [phone] ? ?  ?  ? ? [facility] Follow up.   ?Why: Walk-in to complete intake for 6 month rehabilitation program. Please bring paperwork provided with identification information to intake. ?Contact information: ?[address] ?[place], [place] [address] ? ?Phone: [phone] ? ?  ?  ? ?  ?  ? ?  ? ? ?Next level of care provider has access to [place] ? ?Safety Planning and Suicide Prevention discussed: Yes,  with patient ? ?  ? ?Has patient been referred to the Quitline?: Patient refused referral ? ?Patient has been referred for addiction treatment: Yes. Will be going to [facility] for residential substance use treatment  ? ?Vassie Moselle, LCSW ?11/11/2021, 9:10 AM ?

## 2021-11-11 NOTE — BHH Suicide Risk Assessment (Addendum)
Suicide Risk Assessment ? ?Discharge Assessment    ?[facility] Discharge Suicide Risk Assessment ? ? ?Principal Problem: Schizoaffective disorder ([place]) ?Discharge Diagnoses: Principal Problem: ?  Schizoaffective disorder ([place]) ?Active Problems: ?  Schizoaffective disorder, depressive type ([place]) ?  Tobacco use disorder ?  Polysubstance dependence ([place]) ?  Right hand weakness ?  Neuropathy of hand, right ? ?During the patient's hospitalization, patient had extensive initial psychiatric evaluation, and follow-up psychiatric evaluations every day. ? ?Psychiatric diagnoses provided upon initial assessment: Schizoaffective Disorder, depressive type ? ?Patient's psychiatric medications were adjusted on admission: Started on Thorazine, Depakote. Continued on Gabapentin and Cogentin and his chronic medications. ? ?During the hospitalization, other adjustments were made to the patient's psychiatric medication regimen: He was started on Risperdal and Propanolol for better symptoms control.  He did have some elevated Ammonia so had a dose of Lactulose while hospitalized. ? ?Gradually, patient started adjusting to milieu.   ?Patient's care was discussed during the interdisciplinary team meeting every day during the hospitalization. ? ?The patient is having some fatigue due to prescribed psychiatric medication but otherwise reports no side effects from his medications. ? ?The patient reports their target psychiatric symptoms of depression, SI, and HI, all responded well to the psychiatric medications, and the patient reports overall benefit other psychiatric hospitalization. Supportive psychotherapy was provided to the patient. The patient also participated in regular group therapy while admitted.  ? ?Labs were reviewed with the patient, and abnormal results were discussed with the patient. ? ?The patient denied having suicidal thoughts more than 48 hours prior to discharge.  Patient denies having homicidal thoughts.  Patient denies  having auditory hallucinations.  Patient denies any visual hallucinations.  Patient denies having paranoid thoughts. ? ?The patient is able to verbalize their individual safety plan to this provider. ? ?It is recommended to the patient to continue psychiatric medications as prescribed, after discharge from the hospital.   ? ?It is recommended to the patient to follow up with your outpatient psychiatric provider and PCP. ? ?Discussed with the patient, the impact of alcohol, drugs, tobacco have been there overall psychiatric and medical wellbeing, and total abstinence from substance use was recommended the patient. ? ? ?Total Time spent with patient: 20 minutes ? ?Musculoskeletal: ?Strength & Muscle Tone: within normal limits ?Gait & Station: normal ?Patient leans: N/A ? ?Psychiatric Specialty Exam ? ?Presentation  ?General Appearance: Appropriate for Environment; Casual ? ?Eye Contact:Good ? ?Speech:Clear and Coherent; Normal Rate ? ?Speech Volume:Normal ? ?Handedness:Left ? ? ?Mood and Affect  ?Mood:-- ("ok") ? ?Duration of Depression Symptoms: No data recorded ?Affect:Congruent; Appropriate ? ? ?Thought Process  ?Thought Processes:Coherent; Goal Directed ? ?Descriptions of Associations:Intact ? ?Orientation:Full (Time, Place and Person) ? ?Thought Content:Logical ?No SI, HI, or AVH. No Paranoia, Ideas of Reference, or First Rank symptoms. ? ?History of Schizophrenia/Schizoaffective disorder:Yes ? ?Duration of Psychotic Symptoms:Less than six months ? ?Hallucinations:Hallucinations: None ? ?Ideas of Reference:None ? ?Suicidal Thoughts:Suicidal Thoughts: No  ? ?Homicidal Thoughts:Homicidal Thoughts: No ? ? ?Sensorium  ?Memory:Immediate Fair; Recent Fair ? ?Judgment:Fair ? ?Insight:Fair ? ? ?Executive Functions  ?Concentration:Good ? ?Attention Span:Good ? ?Recall:Good ? ?[place] ? ?Language:Fair ? ? ?Psychomotor Activity  ?Psychomotor Activity:Psychomotor Activity: Normal ? ? ?Assets   ?Assets:Communication Skills; Desire for Improvement; Resilience ? ? ?Sleep  ?Sleep:Sleep: Good ?Number of Hours of Sleep: 6.25 ? ? ?Physical Exam: ?Physical Exam ?Vitals and nursing note reviewed.  ?Constitutional:   ?   General: He is not in acute distress. ?  Appearance: Normal appearance. He is normal weight. He is not ill-appearing or toxic-appearing.  ?   Comments: Acromegaly- large hands and jaw    ?HENT:  ?   Head: Normocephalic and atraumatic.  ?Pulmonary:  ?   Effort: Pulmonary effort is normal.  ?Musculoskeletal:     ?   General: Normal range of motion.  ?Neurological:  ?   General: No focal deficit present.  ?   Mental Status: He is alert.  ? ?Review of Systems  ?Respiratory:  Negative for cough and shortness of breath.   ?Cardiovascular:  Negative for chest pain.  ?Gastrointestinal:  Negative for abdominal pain, constipation, diarrhea, nausea and vomiting.  ?Neurological:  Negative for dizziness, weakness and headaches.  ?Psychiatric/Behavioral:  Negative for depression, hallucinations and suicidal ideas. The patient is not nervous/anxious.   ? ?Blood pressure (!) 126/92, pulse 90, temperature (!) 97.5 ?F (36.4 ?C), temperature source Oral, resp. rate 20, height 6' 3.5" (1.918 m), weight 110.7 kg, SpO2 100 %. Body mass index is 30.1 kg/m?. ? ?Mental Status Per Nursing Assessment::   ?On Admission:  Suicidal ideation indicated by patient, Suicide plan, Intention to act on suicide plan, Thoughts of violence towards others ? ?Demographic Factors:  ?Male, Low socioeconomic status, and Unemployed ? ?Loss Factors: ?Decline in physical health and Financial problems/change in socioeconomic status ? ?Historical Factors: ?Impulsivity ? ?Risk Reduction Factors:   ?Positive social support ? ?Continued Clinical Symptoms:  ?More than one psychiatric diagnosis ?Previous Psychiatric Diagnoses and Treatments ?Medical Diagnoses and Treatments/Surgeries ? ?Cognitive Features That Contribute To Risk:  ?Thought  constriction (tunnel vision)   ? ?Suicide Risk:  ?Mild:  Suicidal ideation of limited frequency, intensity, duration, and specificity. No thoughts of SI today on day of discharge. There are no identifiable plans, no associated intent, mild dysphoria and related symptoms, good self-control (both objective and subjective assessment), few other risk factors, and identifiable protective factors, including available and accessible social support. ? ? Follow-up Information   ? ? [name]. Call.   ?Why: For therapy and medication management services, (if you do not have video capability) please walk in, or call us for virtual options, Monday through Friday between 8 a.m. and 3 p.m.  Office is closed for lunch from 12 - 1 pm. ?Contact information: ?[address], [place], [place] [address] ? ?Phone: [phone] ? ?  ?  ? ?  ?  ? ?  ? ? ?Plan Of Care/Follow-up recommendations:  ?Activity: as tolerated ? ?Diet: heart healthy ? ?Other: ?-Follow-up with your outpatient psychiatric provider -instructions on appointment date, time, and address (location) are provided to you in discharge paperwork. ? ?-Take your psychiatric medications as prescribed at discharge - instructions are provided to you in the discharge paperwork ? ?-Follow-up with outpatient primary care doctor and other specialists -for management of chronic medical disease, including: Acromegaly. You will need routine monitoring of your- weight, A1c, Lipid Panel, CMP, CBC, and EKG because of your antipsychotic. ?  ? ?-Testing: Follow-up with outpatient provider for abnormal lab results: elevated Ammonia. ? ?-Recommend abstinence from alcohol, tobacco, and other illicit drug use at discharge.  ? ?-If your psychiatric symptoms recur, worsen, or if you have side effects to your psychiatric medications, call your outpatient psychiatric provider, 911, [phone] or go to the nearest emergency department. ? ?-If suicidal thoughts recur, call your outpatient psychiatric  provider, 911, [phone] or go to the nearest emergency department. ? ? ?Briant Cedar, MD ?11/11/2021, 7:27 AM ? ?Total Time  Spent in Direct Patient Care:  ?I personally spent 45 minutes on the unit in direct patient care. The dir

## 2021-11-11 NOTE — Group Note (Signed)
LCSW Group Therapy Note ? ? ?Group Date: 11/11/2021 ?Start Time: 1300 ?End Time: 1400 ? ? ?Type of Therapy and Topic:  Group Therapy: Challenging Core Beliefs ? ?Participation Level:  Did Not Attend ? ?Description of Group:  ?Patients were educated about core beliefs and asked to identify one harmful core belief that they have. Patients were asked to explore from where those beliefs originate. Patients were asked to discuss how those beliefs make them feel and the resulting behaviors of those beliefs. They were then be asked if those beliefs are true and, if so, what evidence they have to support them. Lastly, group members were challenged to replace those negative core beliefs with helpful beliefs.  ? ?Therapeutic Goals:  ? ?1. Patient will identify harmful core beliefs and explore the origins of such beliefs. ?2. Patient will identify feelings and behaviors that result from those core beliefs. ?3. Patient will discuss whether such beliefs are true. ?4.  Patient will replace harmful core beliefs with helpful ones. ? ?Summary of Patient Progress:  Patient was discharged and did not attend group.  ? ?Therapeutic Modalities: Cognitive Behavioral Therapy; Solution-Focused Therapy ? ? ?Vassie Moselle, LCSW ?11/11/2021  2:08 PM   ?

## 2021-11-11 NOTE — Group Note (Signed)
Recreation Therapy Group Note ? ? ?Group Topic:Healthy Decision Making  ?Group Date: 11/11/2021 ?Start Time: 1002 ?End Time: 5053 ?Facilitators: Victorino Sparrow, LRT,CTRS ?Location: [place] ? ? ?Goal Area(s) Addresses:  ?Patient will effectively work with peer towards shared goal.  ?Patient will identify factors that guided their decision making.  ?Patient will pro-socially communicate ideas during group session.  ? ?Group Description: Patients were given a scenario that they were going to be stranded on a deserted island for several months before being rescued. Writer tasked them with making a list of 15 things they would choose to bring with them for "survival". The list of items was prioritized most important to least. Each patient would come up with their own list, then work together to create a new list of 15 items while in a group of 3-5 peers. LRT discussed each person's list and how it differed from others. The debrief included discussion of priorities, good decisions versus bad decisions, and how it is important to think before acting so we can make the best decision possible. LRT tied the concept of effective communication among group members to patient's support systems outside of the hospital and its benefit post discharge. ? ? ?Affect/Mood: Happy ?  ?Participation Level: Engaged ?  ?Participation Quality: Independent ?  ?Behavior: Appropriate ?  ?Speech/Thought Process: Focused ?  ?Insight: Good ?  ?Judgement: Good ?  ?Modes of Intervention: Decision Making ?  ?Patient Response to Interventions:  Engaged ?  ?Education Outcome: ? Acknowledges education and In group clarification offered   ? ?Clinical Observations/Individualized Feedback: Pt was late to group but joined in with the group compiling their joint list.  Pt was bright and engaged.  Pt added fishing pole/hook to the list.  Pt talked about being able to build a bathroom area using bamboo.  Pt also added the radion on the list could be  solar powdered since there was not clarification on how the radio would be powered.    ?  ? ?Plan: Continue to engage patient in RT group sessions 2-3x/week. ? ? ?Victorino Sparrow, LRT,CTRS ?11/11/2021 12:44 PM ?

## 2021-11-11 NOTE — Progress Notes (Signed)
Recreation Therapy Notes ? ?INPATIENT RECREATION TR PLAN ? ?Patient Details ?Name: Duane Salazar ?MRN: [id number] ?DOB: May 24, 1976 ?Today's Date: 11/11/2021 ? ?Rec Therapy Plan ?Is patient appropriate for Therapeutic Recreation?: Yes ?Treatment times per week: about 3 days ?Estimated Length of Stay: 5-7 days ?TR Treatment/Interventions: Group participation (Comment) ? ?Discharge Criteria ?Pt will be discharged from therapy if:: Discharged ?Treatment plan/goals/alternatives discussed and agreed upon by:: Patient/family ? ?Discharge Summary ?Short term goals set: See patient care plan ?Progress toward goals comments: Groups attended ?Which groups?: Wellness, Self-esteem, Communication, Coping skills, Other (Comment) (Decision Making; Team Building) ?Therapeutic equipment acquired: N/A ?Reason patient discharged from therapy: Discharge from hospital ?Pt/family agrees with progress & goals achieved: Yes ?Date patient discharged from therapy: 11/11/21 ? ? ?Victorino Sparrow, LRT,CTRS ?Victorino Sparrow A ?11/11/2021, 1:19 PM ?

## 2021-11-12 ENCOUNTER — Ambulatory Visit (HOSPITAL_COMMUNITY)
Admission: RE | Admit: 2021-11-12 | Discharge: 2021-11-12 | Disposition: A | Discharge status: Home / Self Care | Payer: 59 | Attending: Psychiatry | Admitting: Psychiatry

## 2021-11-12 ENCOUNTER — Ambulatory Visit (HOSPITAL_COMMUNITY)
Admission: EM | Admit: 2021-11-12 | Discharge: 2021-11-12 | Disposition: A | Discharge status: Home / Self Care | Payer: 59

## 2021-11-12 DIAGNOSIS — Z765 Malingerer [conscious simulation]: Secondary | ICD-10-CM

## 2021-11-12 DIAGNOSIS — F1994 Other psychoactive substance use, unspecified with psychoactive substance-induced mood disorder: Secondary | ICD-10-CM | POA: Diagnosis present

## 2021-11-12 DIAGNOSIS — F259 Schizoaffective disorder, unspecified: Secondary | ICD-10-CM | POA: Diagnosis present

## 2021-11-12 DIAGNOSIS — F1920 Other psychoactive substance dependence, uncomplicated: Secondary | ICD-10-CM | POA: Diagnosis present

## 2021-11-12 NOTE — Progress Notes (Signed)
Patient was just discharged from [facility] yesterday from a 13 day inpatient admission.  He was supposed to go to the [facility], but he sabotaged his admission into that program.  He states that the social worker did not listen to him and the fact that he needed to have a 30 day supply of medication which he was not given priior to his discharge.  Patient states that he went to [facility] for an assessment and was discharged.  he states that he really wants to get help.  Patient states that he already relapsed and states that he used cocaine this morning.  Patient states that he would like to be admitted into a program.  He states, "what does someone have to go to get help?"  Patient states that he is starting to have suicidal thoughts. Patient appears to be malingering and is routine ?

## 2021-11-12 NOTE — BH Assessment (Addendum)
Comprehensive Clinical Assessment (CCA) Note ? ?11/12/2021 ?Duane Salazar ?[id number] ? ?Disposition:  ?Duane Salazar presented to [facility] as a walk-in. He was assessed by the provider Jinny Blossom, NP). TTS CCA also completed. Discharged from [facility] adult unit yesterday, after a 13-day admission.  ? ?Patient discharged from [facility], directly to [facility] in [place] for residential treatment. His transportation needs were sponsored by [facility]. Unfortunately, he sabotaged his discharge plan for treatment at [facility]"; unable to return to that facility for 30 days.  ? ?Upon further investigation: Patient did show up to the [facility] as planned. However, told staff that he was "running out to the store to get clothing". He never returned back to the facility, apparently found his own transportation back to [place], [place].  ? ?Patient presented back to [facility] today, requesting another admission to the adult unit. Due to suspected Malingering behaviors and secondary gain (homelessness), patient psych cleared by the [facility] provider Jinny Blossom, NP). ? ? Patient given referrals for other substance use residential treatment programs, [facility] outpatient for medication management needs with the outpatient walk-in schedule (2nd floor), additional outpatient therapy programs (CDIOP), etc. Also, a list of homeless shelters.  ? ?Chief Complaint:  ?Chief Complaint  ?Patient presents with  ? Psychiatric Evaluation  ? ?Visit Diagnosis:   ?Schizoaffective disorder, depressive type ([place]) ? Tobacco use disorder ? Polysubstance dependence ([place]) ? ?Duane Salazar presented to [facility] as a walk-in. He has a psychiatric history of schizoaffective disorder, MDD, and poly substance use (marijuana, alcohol, and cocaine, and tobacco use). He was assessed by the provider Jinny Blossom, NP). TTS CCA also completed.  ? ?Discharged from [facility] adult unit yesterday, after a 13-day admission. Patient discharged from [facility], directly to [facility] in [place] for residential  treatment. His transportation needs were sponsored by [facility]. Unfortunately, he sabotaged his discharge plan for treatment at [facility]?; unable to return to that facility for 30 days. Patient returned to [place], [place], used cocaine upon return, and states that he was thinking of jumping from a Bridge (located at [place]). Patient explains that a friend deterred him from jumping off the bridge. he ?showed up at his parents? home? and they transported him to [facility] today.   ? ?Patient presenting back to [facility] today, requesting another admission to the adult unit. Patient states that he has been going through a lot of depression and has been making plans to take his life since discharge yesterday. He endorses suicidal ideation. ? ?He reports following symptoms of depression: Depressed mood, anhedonia, fatigue, decreased energy, decreased sleep, decreased appetite, decreased concentration, and anxiety. He reports following manic symptoms: Racing thoughts, irritability, and distractibility.He reports the following symptoms of psychosis: Paranoia and hearing his own voice in his head. ? ?He reports passive HI toward an unknown male. Denies intent plan as he states, "I understand, if I hurt someone I will return back to prison, I've already done 27 yrs on and off". Denies AVH's.Patient with hx of cocaine use, last use yesterday after discharge from [facility]. Also, smokes cigarettes daily. ? ?Per chart review of his last admission to [facility]: "He states that when he was in prison he could just tell them whatever symptoms he wanted to get whatever diagnosis he wanted but at this time he wants to be accurately diagnosed.  When asked if he was glad that he did not die in his overdose attempt on methamphetamine he states 50/50. He reports past psychiatric history of schizoaffective disorder and  anxiety.  He states that he has been hospitalized approximately 10 times.  He states that he does have 1 previous suicide attempt when he  jumped off a bridge.  He reports he has previously been on Zyprexa, Prozac, Seroquel, Thorazine, gabapentin, Haldol, and Depakote.  He states that the Thorazine helped calm him down and the Depakote also did well with controlling his mood.  He states he did not like being on Seroquel because it made him so that he could not defend himself.  He reports no known family history of diagnoses or suicides." ? ?Due to suspected Malingering behaviors and secondary gain (homelessness), patient psych cleared by the [facility] provider Jinny Blossom, NP). Patient given referrals for other substance use residential treatment programs, [facility] outpatient for medication management needs with the outpatient walk-in schedule (2nd floor), additional outpatient therapy programs (CDIOP), etc. Also, a list of homeless shelters. ? ?Pt's mood was depressed, anxious. Pt's affect was congruent. Pt's insight was fair. Pt's judgement was poor. Pt reports, he is homeless.  ? ?CCA Screening, Triage and Referral (STR) ? ?Patient Reported Information ?How did you hear about [place]? Self ? ?What Is the Reason for Your Visit/Call Today? Duane Salazar presented to [facility] as a walk-in. He was assessed by the provider Jinny Blossom, NP). TTS CCA also completed. Discharged from [facility] adult unit yesterday, after a 13-day admission. Patient discharged from [facility], directly to [facility] in [place] for residential treatment. His transportation needs were sponsored by [facility]. Unfortunately, he sabotaged his discharge plan for treatment at [facility]?; unable to return to that facility for 30 days. Patient returned to [place], [place], used cocaine upon return, and states that he was thinking of jumping from a Bridge (located at [place]). Patient explains that a friend deterred him from jumping off the bridge. he ?showed up at his parents? home? and they transported him to [facility] today.  Patient presenting back to [facility] today, requesting another admission to the adult unit.  Due to suspected Malingering behaviors and secondary gain (homelessness), patient psych cleared by the [facility] provider Jinny Blossom, NP). Patient given referrals for other substance use residential treatment programs, [facility] outpatient for medication management needs with the outpatient walk-in schedule (2nd floor), additional outpatient therapy programs (CDIOP), etc. Also, a list of homeless shelters. ? ?How Long Has This Been Causing You Problems? <Week ? ?What Do You Feel Would Help You the Most Today? Treatment for Depression or other mood problem; Alcohol or Drug Use Treatment; Stress Management; Medication(s); Social Support; Housing Assistance ? ? ?Have You Recently Had Any Thoughts About Hurting Yourself? Yes ? ?Are You Planning to Commit Suicide/Harm Yourself At This time? No ? ? ?Have you Recently Had Thoughts About [place]? No ? ?Are You Planning to Harm Someone at This Time? No ? ?Explanation: No data recorded ? ?Have You Used Any Alcohol or Drugs in the Past 24 Hours? Yes ? ?How Long Ago Did You Use Drugs or Alcohol? No data recorded ?What Did You Use and How Much? Patient reports, using Cocaine yesterday (11/11/2021) '5mg'$ 's, post discharge from [facility] yesterday ? ? ?Do You Currently Have a Therapist/Psychiatrist? Yes ? ?Name of Therapist/Psychiatrist: Patient is linkied to Roanna Epley, [place] ? ? ?Have You Been Recently Discharged From Any Office Practice or Programs? No ? ?Explanation of Discharge From Practice/Program: No data recorded ? ?  ?CCA Screening Triage Referral Assessment ?Type of Contact: Tele-Assessment ? ?Telemedicine Service Delivery:   ?Is this Initial or Reassessment?  Initial Assessment ? ?Date Telepsych consult ordered in [facility]:  10/28/21 ? ?Time Telepsych consult ordered in [facility]:  1158 ? ?Location of Assessment: [facility] ? ?Provider Location: [facility] Assessment Services ? ? ?Collateral Involvement: Pt declined for clinician to contact anyone. Pt denies, having  family, friend supports. ? ? ?Does Patient Have a Stage manager Guardian? No data recorded ?Name and Contact of Legal Guardian: No data recorded ?If Minor and Not Living with Parent(s), Who has Cust

## 2021-11-12 NOTE — Discharge Instructions (Addendum)

## 2021-11-12 NOTE — ED Notes (Signed)
Patient was discharged from facility. Patient left facility prior to receiving his AVS.  ?

## 2021-11-12 NOTE — ED Provider Notes (Signed)
Behavioral Health Urgent Care Medical Screening Exam ? ?Patient Name: Duane Salazar ?MRN: [id number] ?Date of Evaluation: 11/12/21 ?Chief Complaint:   ?Diagnosis:  ?Final diagnoses:  ?Substance induced mood disorder ([place])  ? ? ?History of Present illness: Duane Salazar is a 46 y.o. male. Patient presents voluntarily to [facility] behavioral health for walk-in assessment.  Patient is accompanied by his mother who does not remain present during assessment.  ? ? Random reports he would like to be admitted to residential substance use treatment.  He was discharged from [facility] behavioral health on yesterday to a facility in [place].  When he arrived at facility he was made aware that he would need to stand on his feet for at least 8 hours/day, he reports he is unable to do this.  Patient traveled by train from [place] to his mother's home in [place] last night. ? ?Recent stressors include substance use.  He reports he has a history of using cocaine daily.  Over the last 2 weeks he has not used drugs or alcohol except on last night.  Last night he reports he ingested 1 shot of alcohol and 1 line of cocaine after 2 weeks sobriety.  He remains committed to his sobriety and would like to seek residential substance use treatment. ? ?Today he would like to be transferred to a residential substance use treatment facility in [facility].  He is uncertain as to specific facility.  He believes the facility manager's name may be Vincente Liberty, he has no further information.  He becomes frustrated with this Probation officer is unable to determine exactly which facility he is referring to. ? ?Patient reports he has been diagnosed with substance use disorder, schizophrenia, schizoaffective disorder and cocaine use disorder.  He reports he is followed outpatient by Burt Ek at [facility] behavioral health.  He was recently discharged from [facility] behavioral health after an inpatient stay.  He reports history of multiple  inpatient psychiatric hospitalizations. ? ?Patient is assessed face-to-face by nurse practitioner.  He is seated in assessment area, no acute distress.  He is alert and oriented, pleasant and cooperative during assessment.  ?He presents with euthymic mood, congruent affect. He denies suicidal and homicidal ideations.  He contracts verbally for safety with this Probation officer. ? He has normal speech and behavior.  He denies both auditory and visual hallucinations currently. Sidi reports several days ago he experienced visual hallucinations but he felt that he could see "a white bell."  ? Patient is able to converse coherently with goal-directed thoughts and no distractibility or preoccupation.  He denies paranoia.  Objectively there is no evidence of psychosis/mania or delusional thinking. ? ?Duane Salazar resides in [place] with his mother.  He is currently not employed.  He shares he has recently filed for disability benefits related to his history of pituitary brain tumor.  Patient endorses average sleep and appetite. ? ?Patient offered support and encouragement. ? ?Patient becomes frustrated when this facility is unable to offer transportation.  He states  "what does somebody have to say or do to get help?!" ? ? ?Psychiatric Specialty Exam ? ?Presentation  ?General Appearance:Appropriate for Environment; Casual ? ?Eye Contact:Good ? ?Speech:Clear and Coherent; Normal Rate ? ?Speech Volume:Normal ? ?Handedness:Right ? ? ?Mood and Affect  ?Mood:Euthymic ? ?Affect:Appropriate; Congruent ? ? ?Thought Process  ?Thought Processes:Coherent; Goal Directed; Linear ? ?Descriptions of Associations:Intact ? ?Orientation:Full (Time, Place and Person) ? ?Thought Content:Logical; WDL ? Diagnosis of Schizophrenia or Schizoaffective disorder in past: No ?  Hallucinations:None ?voices  telling him people are out to attack ?saw his girlfriend in the other bed in his room last night. ? ?Ideas of Reference:None ? ?Suicidal Thoughts:No ?Without  Intent; Without Plan ? ?Homicidal Thoughts:No ?Without Intent; Without Plan ? ? ?Sensorium  ?Memory:Immediate Good; Recent Fair ? ?Judgment:Fair ? ?Insight:Present ? ? ?Executive Functions  ?Concentration:Good ? ?Attention Span:Good ? ?Recall:Good ? ?[place] ? ?Language:Good ? ? ?Psychomotor Activity  ?Psychomotor Activity:Normal ? ? ?Assets  ?Assets:Communication Skills; Desire for Improvement; Financial Resources/Insurance; Leisure Time; Resilience; Social Support ? ? ?Sleep  ?Sleep:Good ? ?Number of hours: 6.75 ? ? ?No data recorded ? ?Physical Exam: ?Physical Exam ?Vitals and nursing note reviewed.  ?Constitutional:   ?   Appearance: Normal appearance. He is well-developed.  ?HENT:  ?   Head: Normocephalic and atraumatic.  ?   Nose: Nose normal.  ?Cardiovascular:  ?   Rate and Rhythm: Normal rate.  ?Pulmonary:  ?   Effort: Pulmonary effort is normal.  ?Musculoskeletal:     ?   General: Normal range of motion.  ?   Cervical back: Normal range of motion.  ?Skin: ?   General: Skin is warm and dry.  ?Neurological:  ?   Mental Status: He is alert and oriented to person, place, and time.  ?Psychiatric:     ?   Attention and Perception: Attention and perception normal.     ?   Mood and Affect: Mood and affect normal.     ?   Speech: Speech normal.     ?   Behavior: Behavior normal. Behavior is cooperative.     ?   Thought Content: Thought content normal.     ?   Cognition and Memory: Cognition and memory normal.     ?   Judgment: Judgment normal.  ? ?Review of Systems  ?Constitutional: Negative.   ?HENT: Negative.    ?Eyes: Negative.   ?Respiratory: Negative.    ?Cardiovascular: Negative.   ?Gastrointestinal: Negative.   ?Genitourinary: Negative.   ?Musculoskeletal: Negative.   ?Skin: Negative.   ?Neurological: Negative.   ?Endo/Heme/Allergies: Negative.   ?Psychiatric/Behavioral:  Positive for substance abuse.   ?Blood pressure 104/77, pulse 100, temperature 98.2 ?F (36.8 ?C), temperature source Oral,  resp. rate 18, SpO2 98 %. There is no height or weight on file to calculate BMI. ? ?Musculoskeletal: ?Strength & Muscle Tone: within normal limits ?Gait & Station: normal ?Patient leans: N/A ? ? ?[facility] Discharge Disposition for Follow up and Recommendations: ?Based on my evaluation the patient does not appear to have an emergency medical condition and can be discharged with resources and follow up care in outpatient services for Medication Management and Individual Therapy ?Patient reviewed with Dr. Serafina Mitchell. ?Follow-up with outpatient psychiatry at [facility] behavioral health. ?Continue current medications. ?Follow-up with substance use treatment resources provided. ? ? ? ?Lucky Rathke, FNP ?11/12/2021, 2:27 PM ? ?

## 2021-11-12 NOTE — H&P (Signed)
Behavioral Health Medical Screening Exam ? ?Duane Salazar is a 46 y.o. male who presented to [facility], voluntarily. He is status post discharge from the inpatient adult psychiatric unit on 3/15 after a 13 day stay. He has a history of Schizoaffective disorder, Polysubstance dependence and Substance Induced Mood Disorder. He was discharged to [facility] in [place]. His transportation to [place] was provided by [facility] Unfortunately, once he arrived at the [facility] he sabotaged  the treatment plan at the [facility] and he is not able to return to that program for 30 days. He returned to [place] and showed up on his mother's doorstep this morning. She brought him to [facility] for an evaluation.  ?Patient is requesting evaluation for medication adjustments and suicidal ideation. Patient stated he arrived at the [facility] and was told he could go to the pharmacy the next day to pick up his 30 day supply of medications. He was provided with 7 days worth of medications when he left [facility]. He stated the man at the [facility] told him he had to work 8 hours but then he saw the Stokes on his arm and asked him how he would be able to work for 8 hours with that on his arm. Patient was indicating he was told to leave the program because he is a  "FALL RISK". Patient's did not remove his hospital bracelets, they are still on his arm today. This provider and Waldon Merl, TTS clinician telephoned the [facility] to inquire if patient is eligible to return to the program. We were informed the patient was admitted to the program, told them he was going to the store to get clothes and never returned, and now has to wait 30 days before he can return.  ? ?Patient stated he used .5 gram of cocaine last night. He appears somewhat anxious with bloodshot eyes.  He stated he was having suicidal thoughts and was thinking of jumping from the bridge on [address] but a friend talked him out of it. Patient stated he has attempted suicide several times, when  pressed for further details he stated "I have never done anything to hurt myself but I have had thoughts." Patient's suicidal ideation is chronic in nature and passive. He has no specific plan or intent. He denies access to weapons, he stated "I am a felon and I don't want to go back to prison." Patient stated he has homicidal ideation, for about 1.5 years, toward someone out there who wants to hurt him, he does not name anyone specifically. Patient vaguely mention auditory hallucinations in the context of being in prison. Patient constantly brings up his prison time and history of a brain tumor. Patient stated he has spent 27 years of his life in prison. He is homeless. He stated he is here because he needs more time and medication adjustments. There is a strong suspicion that patient is malingering and has a high degree of secondary gain by seeking shelter inside the hospital. ? ?Patient does not meet criteria for a repeat inpatient psychiatric admission. Patient was discussed with Dr Caswell Corwin who agrees that patient should follow up with his outpatient provider at [facility]. Patient was given resources for [facility] programs and residential substance abuse treatment programs in the area. He was provided with mental health resources in the community and homeless shelters. He was strongly encouraged to call his outpatient provider at [facility]  and obtain an appointment for medication management. His prescriptions were sent to a pharmacy in [place],  patient stated he will get them transferred to [facility]. ? ?Total Time spent with patient: 30 minutes ? ?Psychiatric Specialty Exam: ? ?Presentation  ?General Appearance: Appropriate for Environment; Casual ? ?Eye Contact:Good ? ?Speech:Slurred; Clear and Coherent ? ?Speech Volume:Normal ? ?Handedness:Left ? ?Mood and Affect  ?Mood:Euthymic ? ?Affect:Congruent; Appropriate ? ?Thought Process  ?Thought Processes:Coherent; Goal Directed ? ?Descriptions of  Associations:Intact ? ?Orientation:Full (Time, Place and Person) ? ?Thought Content:Logical ? ?History of Schizophrenia/Schizoaffective disorder:Yes ? ?Duration of Psychotic Symptoms:Less than six months ? ?Hallucinations:Hallucinations: None ? ?Ideas of Reference:None ? ?Suicidal Thoughts:Suicidal Thoughts: Yes, Passive ?SI Passive Intent and/or Plan: Without Intent; Without Plan ? ?Homicidal Thoughts:Homicidal Thoughts: No ? ?Sensorium  ?Memory:Immediate Fair; Recent Fair ? ?Judgment:Fair ? ?Insight:Shallow ? ?Executive Functions  ?Concentration:Good ? ?Attention Span:Good ? ?Recall:Good ? ?[place] ? ?Language:Good; Fair ? ?Psychomotor Activity  ?Psychomotor Activity:Psychomotor Activity: Normal ? ?Assets  ?Assets:Communication Skills; Desire for Improvement; Resilience ? ?Sleep  ?Sleep:Sleep: Good ?Number of Hours of Sleep: 6.75 ? ?Physical Exam: ?Physical Exam ?Vitals reviewed.  ?Constitutional:   ?   Appearance: Normal appearance.  ?HENT:  ?   Head: Normocephalic.  ?   Nose: Nose normal.  ?Eyes:  ?   Pupils: Pupils are equal, round, and reactive to light.  ?Pulmonary:  ?   Effort: Pulmonary effort is normal.  ?Musculoskeletal:     ?   General: Normal range of motion.  ?   Cervical back: Normal range of motion.  ?Neurological:  ?   General: No focal deficit present.  ?   Mental Status: He is alert and oriented to person, place, and time.  ?Psychiatric:     ?   Attention and Perception: Attention normal. He does not perceive auditory or visual hallucinations.     ?   Mood and Affect: Mood is anxious.     ?   Speech: Speech is slurred.     ?   Behavior: Behavior normal. Behavior is cooperative.     ?   Thought Content: Thought content is not paranoid or delusional. Thought content includes suicidal (passive, chronic without intenet or plan) ideation. Thought content does not include homicidal ideation. Thought content does not include homicidal or suicidal plan.  ? ?Review of Systems   ?Constitutional: Negative.  Negative for fever.  ?HENT: Negative.  Negative for congestion and sore throat.   ?Respiratory: Negative.  Negative for cough.   ?Cardiovascular: Negative.  Negative for chest pain.  ?Musculoskeletal: Negative.   ?Neurological: Negative.   ?Psychiatric/Behavioral:  Positive for substance abuse and suicidal ideas (passive without intent or plan). The patient is nervous/anxious.   ? ?Blood pressure (!) 135/109, pulse (!) 101, temperature 97.8 ?F (36.6 ?C), temperature source Oral, resp. rate 16, SpO2 100 %. There is no height or weight on file to calculate BMI. ? ?Musculoskeletal: ?Strength & Muscle Tone: within normal limits ?Gait & Station: normal ?Patient leans: N/A ? ?Recommendations: ? ?Based on my evaluation the patient does not appear to have an emergency medical condition.Patient was provided with outpatient resources to [facility], [place], information on [place] and residential substance abuse treatment programs and a list of homeless shelters in the area. Patient was advised to call his outpatient provider at [facility] and schedule a follow up medication management appointment. Patient was given strict return precautions;call 911, mobile crisis, go to the nearest emergency room, return to [facility] or [facility], call the [place] or text [phone]. Patient verbalized understanding and left [place] in  no apparent distress.  ? ?Ethelene Hal, NP ?11/12/2021, 12:22 PM ? ?

## 2021-11-13 ENCOUNTER — Other Ambulatory Visit: Payer: Self-pay

## 2021-11-16 ENCOUNTER — Other Ambulatory Visit: Payer: Self-pay

## 2021-11-16 MED ORDER — PREDNISONE 5 MG PO TABS
ORAL_TABLET | ORAL | 0 refills | Status: DC
  Filled 2021-11-16 – 2021-12-08 (×2): qty 30, 30d supply, fill #0

## 2021-11-17 ENCOUNTER — Ambulatory Visit: Payer: Self-pay | Admitting: Internal Medicine

## 2021-11-17 ENCOUNTER — Other Ambulatory Visit: Payer: Self-pay

## 2021-11-23 ENCOUNTER — Other Ambulatory Visit: Payer: Self-pay

## 2021-11-26 ENCOUNTER — Ambulatory Visit (HOSPITAL_COMMUNITY)
Admission: EM | Admit: 2021-11-26 | Discharge: 2021-11-29 | Disposition: A | Discharge status: Home / Self Care | Payer: 59 | Attending: Behavioral Health | Admitting: Behavioral Health

## 2021-11-26 ENCOUNTER — Other Ambulatory Visit: Payer: Self-pay

## 2021-11-26 DIAGNOSIS — Z79899 Other long term (current) drug therapy: Secondary | ICD-10-CM | POA: Insufficient documentation

## 2021-11-26 DIAGNOSIS — F1994 Other psychoactive substance use, unspecified with psychoactive substance-induced mood disorder: Secondary | ICD-10-CM | POA: Insufficient documentation

## 2021-11-26 DIAGNOSIS — Z20822 Contact with and (suspected) exposure to covid-19: Secondary | ICD-10-CM | POA: Insufficient documentation

## 2021-11-26 DIAGNOSIS — R45850 Homicidal ideations: Secondary | ICD-10-CM | POA: Insufficient documentation

## 2021-11-26 DIAGNOSIS — R45851 Suicidal ideations: Secondary | ICD-10-CM | POA: Insufficient documentation

## 2021-11-26 DIAGNOSIS — F17210 Nicotine dependence, cigarettes, uncomplicated: Secondary | ICD-10-CM | POA: Insufficient documentation

## 2021-11-26 DIAGNOSIS — Z5900 Homelessness unspecified: Secondary | ICD-10-CM | POA: Diagnosis not present

## 2021-11-26 LAB — COMPREHENSIVE METABOLIC PANEL
ALT: 29 U/L (ref 0–44)
AST: 37 U/L (ref 15–41)
Albumin: 3.9 g/dL (ref 3.5–5.0)
Alkaline Phosphatase: 59 U/L (ref 38–126)
Anion gap: 10 (ref 5–15)
BUN: 16 mg/dL (ref 6–20)
CO2: 23 mmol/L (ref 22–32)
Calcium: 9.5 mg/dL (ref 8.9–10.3)
Chloride: 105 mmol/L (ref 98–111)
Creatinine, Ser: 0.82 mg/dL (ref 0.61–1.24)
GFR, Estimated: >60 mL/min (ref >60)
Glucose, Bld: 115 mg/dL — ABNORMAL HIGH (ref 70–99)
Potassium: 4 mmol/L (ref 3.5–5.1)
Sodium: 138 mmol/L (ref 135–145)
Total Bilirubin: 0.8 mg/dL (ref 0.3–1.2)
Total Protein: 7.2 g/dL (ref 6.5–8.1)

## 2021-11-26 LAB — ETHANOL: Alcohol, Ethyl (B): <10 mg/dL (ref <10)

## 2021-11-26 LAB — CBC WITH DIFFERENTIAL/PLATELET
Abs Immature Granulocytes: 0.03 10*3/uL (ref 0.00–0.07)
Basophils Absolute: 0.1 10*3/uL (ref 0.0–0.1)
Basophils Relative: 1 %
Eosinophils Absolute: 0.1 10*3/uL (ref 0.0–0.5)
Eosinophils Relative: 1 %
HCT: 44.7 % (ref 39.0–52.0)
Hemoglobin: 14.7 g/dL (ref 13.0–17.0)
Immature Granulocytes: 0 %
Lymphocytes Relative: 26 %
Lymphs Abs: 2.5 10*3/uL (ref 0.7–4.0)
MCH: 23.8 pg — ABNORMAL LOW (ref 26.0–34.0)
MCHC: 32.9 g/dL (ref 30.0–36.0)
MCV: 72.3 fL — ABNORMAL LOW (ref 80.0–100.0)
Monocytes Absolute: 1 10*3/uL (ref 0.1–1.0)
Monocytes Relative: 10 %
Neutro Abs: 6.1 10*3/uL (ref 1.7–7.7)
Neutrophils Relative %: 62 %
Platelets: 219 10*3/uL (ref 150–400)
RBC: 6.18 MIL/uL — ABNORMAL HIGH (ref 4.22–5.81)
RDW: 17.1 % — ABNORMAL HIGH (ref 11.5–15.5)
WBC: 9.9 10*3/uL (ref 4.0–10.5)
nRBC: 0 % (ref 0.0–0.2)

## 2021-11-26 LAB — POCT URINE DRUG SCREEN - MANUAL ENTRY (I-SCREEN)

## 2021-11-26 LAB — VALPROIC ACID LEVEL: Valproic Acid Lvl: <10 ug/mL — ABNORMAL LOW (ref 50.0–100.0)

## 2021-11-26 LAB — POC SARS CORONAVIRUS 2 AG
SARSCOV2ONAVIRUS 2 AG: NEGATIVE
SARSCOV2ONAVIRUS 2 AG: NEGATIVE

## 2021-11-26 LAB — RESP PANEL BY RT-PCR (FLU A&B, COVID) ARPGX2
Influenza A by PCR: NEGATIVE
Influenza B by PCR: NEGATIVE
SARS Coronavirus 2 by RT PCR: NEGATIVE

## 2021-11-26 LAB — POC SARS CORONAVIRUS 2 AG -  ED: SARS Coronavirus 2 Ag: NEGATIVE

## 2021-11-26 MED ORDER — CELECOXIB 100 MG PO CAPS
200.0000 mg | ORAL_CAPSULE | Freq: Every day | ORAL | Status: DC
  Administered 2021-11-26 – 2021-11-29 (×4): 200 mg via ORAL
  Filled 2021-11-26 (×4): qty 2

## 2021-11-26 MED ORDER — MAGNESIUM HYDROXIDE 400 MG/5ML PO SUSP: 30.0000 mL | Freq: Every day | ORAL | Status: DC | PRN

## 2021-11-26 MED ORDER — ALUM & MAG HYDROXIDE-SIMETH 200-200-20 MG/5ML PO SUSP: 30.0000 mL | ORAL | Status: DC | PRN

## 2021-11-26 MED ORDER — BENZTROPINE MESYLATE 0.5 MG PO TABS
0.5000 mg | ORAL_TABLET | Freq: Two times a day (BID) | ORAL | Status: DC
  Administered 2021-11-26 – 2021-11-29 (×7): 0.5 mg via ORAL
  Filled 2021-11-26 (×7): qty 1

## 2021-11-26 MED ORDER — GABAPENTIN 400 MG PO CAPS
400.0000 mg | ORAL_CAPSULE | Freq: Three times a day (TID) | ORAL | Status: DC
  Administered 2021-11-26 – 2021-11-29 (×11): 400 mg via ORAL
  Filled 2021-11-26 (×10): qty 1

## 2021-11-26 MED ORDER — HYDROXYZINE HCL 25 MG PO TABS
25.0000 mg | ORAL_TABLET | Freq: Three times a day (TID) | ORAL | Status: DC | PRN
  Administered 2021-11-28: 25 mg via ORAL
  Filled 2021-11-26: qty 1

## 2021-11-26 MED ORDER — RISPERIDONE 1 MG PO TABS
1.0000 mg | ORAL_TABLET | Freq: Every day | ORAL | Status: DC
  Administered 2021-11-26 – 2021-11-28 (×4): 1 mg via ORAL
  Filled 2021-11-26 (×3): qty 1

## 2021-11-26 MED ORDER — ACETAMINOPHEN 325 MG PO TABS: 650.0000 mg | ORAL_TABLET | Freq: Four times a day (QID) | ORAL | Status: DC | PRN

## 2021-11-26 MED ORDER — AMLODIPINE BESYLATE 5 MG PO TABS
5.0000 mg | ORAL_TABLET | Freq: Every day | ORAL | Status: DC
  Administered 2021-11-26 – 2021-11-29 (×4): 5 mg via ORAL
  Filled 2021-11-26 (×4): qty 1

## 2021-11-26 MED ORDER — METFORMIN HCL 500 MG PO TABS
500.0000 mg | ORAL_TABLET | Freq: Every day | ORAL | Status: DC
  Administered 2021-11-27 – 2021-11-29 (×3): 500 mg via ORAL
  Filled 2021-11-26 (×3): qty 1

## 2021-11-26 MED ORDER — LOSARTAN POTASSIUM 50 MG PO TABS
50.0000 mg | ORAL_TABLET | Freq: Every day | ORAL | Status: DC
  Administered 2021-11-26 – 2021-11-29 (×4): 50 mg via ORAL
  Filled 2021-11-26 (×4): qty 1

## 2021-11-26 MED ORDER — DIVALPROEX SODIUM 500 MG PO DR TAB
750.0000 mg | DELAYED_RELEASE_TABLET | Freq: Two times a day (BID) | ORAL | Status: DC
  Administered 2021-11-26 – 2021-11-29 (×7): 750 mg via ORAL
  Filled 2021-11-26 (×7): qty 1

## 2021-11-26 MED ORDER — CHLORPROMAZINE HCL 25 MG PO TABS
37.5000 mg | ORAL_TABLET | Freq: Two times a day (BID) | ORAL | Status: DC
  Administered 2021-11-26 – 2021-11-29 (×6): 37.5 mg via ORAL
  Filled 2021-11-26 (×5): qty 2

## 2021-11-26 MED ORDER — TRAZODONE HCL 100 MG PO TABS
100.0000 mg | ORAL_TABLET | Freq: Every evening | ORAL | Status: DC | PRN
  Administered 2021-11-28: 100 mg via ORAL
  Filled 2021-11-26: qty 1

## 2021-11-26 MED ORDER — RISPERIDONE 0.5 MG PO TABS
0.5000 mg | ORAL_TABLET | Freq: Every day | ORAL | Status: DC
  Administered 2021-11-26 – 2021-11-29 (×4): 0.5 mg via ORAL
  Filled 2021-11-26 (×4): qty 1

## 2021-11-26 MED ORDER — CHLORPROMAZINE HCL 50 MG PO TABS
50.0000 mg | ORAL_TABLET | Freq: Every day | ORAL | Status: DC
  Administered 2021-11-26 – 2021-11-28 (×3): 50 mg via ORAL
  Filled 2021-11-26 (×3): qty 1

## 2021-11-26 NOTE — ED Notes (Signed)
This nurse spoke with patient while in waiting room who states "I am having an allergic rxn to the medication I was given at the other behavioral health hospital." When asked what sx the patient was experiencing he stated" my neck hurts and I want to kill myself or somebody". Pt vitals has been taken and he has been placed in the assessment room where he is speaking with a counselor at the moment. The patient presents as anxious however no other signs of distress are noted. ?

## 2021-11-26 NOTE — Progress Notes (Signed)
Pt is resting quietly. No distress noted or concerns voiced. Staff will monitor for pt's safety. ?
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
## 2021-11-26 NOTE — ED Notes (Signed)
Pt admitted to overnight observation endorsing SI w/plan to OD on Fentanyl. Pt states, "I've become paranoid that people are after me, I find myself fighting people for no reason. My mental health is declining. I need to get back right on my medicine. I used cocaine 3 days ago. I just need some help. I don't trust myself". Pt denies AVH or HI. Pleasant and cooperative throughout assessment. Oriented to unit and unit rules. Will monitor for safety. ?

## 2021-11-26 NOTE — ED Notes (Signed)
Patient states he is having difficulty breathing - upon visualization, there are no sxs of distress.  Off-going provider did visualize patient.  Patient endorsing SI and HI - taken back to room and TTS with patient - belongings with security - will continue to monitor for safety ?

## 2021-11-26 NOTE — ED Notes (Signed)
Patient admitted to [facility] endorsing SI. Patient stated.he still feel suicidal..stated he woke up from nap with racing thoughts, thinking about just ending it all. Patient was cooperative during medication administration. Meal and drinks offered to patient. Patient is resting comfortably. Respirations are even and unlabored. Will continue to monitor for safety.   ?

## 2021-11-26 NOTE — ED Provider Notes (Signed)
Behavioral Health Admission H&P ?(FBC & OBS) ? ?Date: 11/26/21 ?Patient Name: Duane Salazar ?MRN: [id number] ? ?Diagnoses:  ?Final diagnoses:  ?Substance induced mood disorder ([place])  ?Suicidal ideations  ? ? ?HPI: [name] is a 46 year old male patient who presents to the [facility] behavioral health urgent care voluntarily unaccompanied as a walk-in with a chief complaint of SI/HI/substance use. ? ?Patient seen and evaluated face-to-face by this provider, chart reviewed and case discussed with Dr. Serafina Mitchell.  ? ?On evaluation, patient is alert and oriented x4. His thought process is logical and goal directed. His speech is clear and coherent. His mood is irritable and anxious and affect is congruent. ? ?Patient endorses suicidal ideations this morning and states that he "went and got fentanyl but did not know how to shoot it up." He endorses homicidal ideations and states, "I want to fuck people up." He denies HI towards a specific person and denies a plan or intent. He does not have access to weapons.  He denies AVH and states, "not yet." However, he reports hearing voices in his head that he describes as "shit telling me to kill, do it yourself." ? ?He states that he needs to get back on his medications. He states that he has been off his medications for the past 5 to 7 days. He states that while he was at [facility] he had a reaction to one of the medications but they continued to prescribe him the medication. He described the reaction as muscle spasms in his neck.  He states that after he was discharged from [facility] he was sent to a [facility] based work facility in either [place] or [place] without a 30-day supply of medications. He states that he was only given a 7 days supply of medications. He states that he was unable to stay at the facility because he was unable to perform the work duties. He states that he is unable to physically work because he has arthritis, and brain tumors. When asked if he has followed  up with Dr. Bradley Ferris for outpatient services for medication management, he holds his head down and states no, he does not further elaborate.  ? ?He states that he is homeless but was at his mother's house yesterday until early morning until he had some "words" with her boyfriend. He reports drinking alcohol, on average 3-4 beers per day. He reports drinking alcohol since age 61 or 17. He reports that his last alcoholic beverage was yesterday morning. He denies current alcohol withdrawal symptoms. He denise hx of alcohol withdrawal seizures or DT's. He reports using cocaine, 3 lines of cocaine in 3 days and last use was "40 hours ago." He reports on average he uses a gram of cocaine.  ? ?I discussed with the patient staying at the [facility] continuous assessment unit for overnight observation to restart his medications. Patient verbally consents for treatment and states, "can you get me into a detox facility before I leave."  I discussed with the patient that I will consult with the social worker for substance use treatment options. Patient verbalizes understanding.  ? ?Patient states that he is prescribed Thorazine and Cogentin and cannot remember the other medications that he was taking. I reviewed the patient's previous medications from his encounter at [facility] on 10/29/21  with him as followed:  ?Norvasc 5 mg p.o. daily ?Celebrex 200 mg p.o. daily ?Thorazine 37.5 mg p.o. twice daily and 50 mg p.o. nightly ?Cogentin 0.5 mg p.o. twice daily ?Depakote 750 mg  p.o. twice daily ?Neurontin 400 mg p.o. 3 times daily ?Cozaar 50 mg p.o. daily ?Inderal 10 mg p.o. 3 times daily ?Risperdal 1 mg p.o. nightly ?Risperdal 0.5 mg p.o. daily ?Metformin 500 mg p.o. daily ?Trazodone 100 mg p.o. nightly ? ?Patient was asked if he had an adverse reaction to the Thorazine or Risperdal which he previously described experiencing side effects to the medication as muscle spasms in his neck. He states that he probably had the reaction because he did  not take his Cogentin with the Thorazine. He states that he would like to continue on the Thorazine and Risperdal. Patient confirms that the stated medications are correct but he is uncertain of the dosages. ? ? ?PHQ 2-9:  ?Flowsheet Row ED from 11/26/2021 in [facility] Video Visit from 10/01/2021 in [facility] Office Visit from 09/04/2021 in [place]  ?Thoughts that you would be better off dead, or of hurting yourself in some way Several days Several days Not at all  ?PHQ-9 Total Score '17 23 12  '$ ? ?  ?  ?[place] ED from 11/26/2021 in [facility] Admission (Discharged) from 10/29/2021 in [place] [facility] ED from 10/28/2021 in [place]  ?C-SSRS RISK CATEGORY High Risk High Risk High Risk  ? ?  ?  ? ?Total Time spent with patient: 30 minutes ? ?Musculoskeletal  ?Strength & Muscle Tone: within normal limits ?Gait & Station: normal ?Patient leans: N/A ? ?Psychiatric Specialty Exam  ?Presentation ?General Appearance: Disheveled ? ?Eye Contact:Fair ? ?Speech:Clear and Coherent ? ?Speech Volume:Normal ? ?Handedness:Right ? ? ?Mood and Affect  ?Mood:Irritable; Anxious ? ?Affect:Congruent ? ? ?Thought Process  ?Thought Processes:Coherent; Goal Directed ? ?Descriptions of Associations:Intact ? ?Orientation:Full (Time, Place and Person) ? ?Thought Content:Logical ? Diagnosis of Schizophrenia or Schizoaffective disorder in past: Yes ? Duration of Psychotic Symptoms: Greater than six months ? ?Hallucinations:Hallucinations: Auditory ? ?Ideas of Reference:None ? ?Suicidal Thoughts:Suicidal Thoughts: Yes, Active ? ?Homicidal Thoughts:Homicidal Thoughts: Yes, Active ? ? ?Sensorium  ?Memory:Immediate Fair; Recent Fair; Remote Fair ? ?Judgment:Intact ? ?Insight:Present ? ? ?Executive Functions  ?Concentration:Fair ? ?Attention  Span:Fair ? ?Recall:Fair ? ?[place] ? ?Language:Fair ? ? ?Psychomotor Activity  ?Psychomotor Activity:Psychomotor Activity: Normal ? ? ?Assets  ?Assets:Communication Skills; Desire for Improvement; Physical Health; Leisure Time ? ? ?Sleep  ?Sleep:Sleep: Fair ? ? ?Nutritional Assessment (For OBS and FBC admissions only) ?Has the patient had a weight loss or gain of 10 pounds or more in the last 3 months?: No ?Has the patient had a decrease in food intake/or appetite?: No ?Does the patient have dental problems?: No ?Does the patient have eating habits or behaviors that may be indicators of an eating disorder including binging or inducing vomiting?: No ?Has the patient recently lost weight without trying?: 0 ?Has the patient been eating poorly because of a decreased appetite?: 0 ?Malnutrition Screening Tool Score: 0 ? ? ? ?Physical Exam ?Constitutional:   ?   Appearance: Normal appearance.  ?HENT:  ?   Head: Normocephalic.  ?   Nose: Nose normal.  ?Eyes:  ?   Conjunctiva/sclera: Conjunctivae normal.  ?Cardiovascular:  ?   Rate and Rhythm: Normal rate.  ?Pulmonary:  ?   Effort: Pulmonary effort is normal.  ?Neurological:  ?   Mental Status: He is alert and oriented to person, place, and time.  ? ?Review of Systems  ?Constitutional: Negative.   ?  HENT: Negative.    ?Eyes: Negative.   ?Respiratory: Negative.    ?Cardiovascular: Negative.   ?Gastrointestinal: Negative.   ?Genitourinary: Negative.   ?Musculoskeletal:  Positive for joint pain.  ?Neurological: Negative.   ?Endo/Heme/Allergies: Negative.   ? ?Blood pressure (!) 153/97, pulse 86, temperature 97.9 ?F (36.6 ?C), temperature source Oral, resp. rate 16, SpO2 96 %. There is no height or weight on file to calculate BMI. ? ?Past Psychiatric History: Hx of substance induced mood dx, and schizoaffective dx.  ? ?Is the patient at risk to self? Yes  ?Has the patient been a risk to self in the past 6 months? Yes .    ?Has the patient been a risk to self  within the distant past? Yes   ?Is the patient a risk to others? Yes   ?Has the patient been a risk to others in the past 6 months? Unknown  ?Has the patient been a risk to others within the distant past? Unknown  ? ?Past Medica

## 2021-11-26 NOTE — ED Notes (Signed)
Received patient this PM. Patient is sleeping in his bed. Patient respirations are even and unlabored. Will continue to monitor for safety.  ?
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
## 2021-11-27 NOTE — Progress Notes (Signed)
Patient is alert and oriented X 4, with active SI, Hi and AVH. Patient also states he having blurred vision from thorazine. Patient is logical and coherent when having conversations. Very assertive, can be irritable with staff when not having his way. Nursing staff will continue to monitor patient. ?

## 2021-11-27 NOTE — ED Notes (Signed)
Patient continues to rest with no sxs of distress - will continue to monitor for safety ?

## 2021-11-27 NOTE — ED Provider Notes (Signed)
Behavioral Health Progress Note ? ?Date and Time: 11/27/2021 10:24 AM ?Name: Duane Salazar ?MRN:  [id number] ? ?Subjective:  Pt assessed face to face by nurse practitioner today. Pt reports depressed, "bad" mood.  Endorses SI. Reports plan to obtain more fentanyl, and "shoot it up". States he would be able to obtain fentanyl from friends who would teach him how to use it. Endorses HI. Reports plan to shoot family members and friends w/ a firearm. States his friends have firearms he would be able to take. Denies current AVH. States he last experienced CAH yesterday "telling me to hurt people and kill yourself". Reports was 25 days sober from all substances, although relapsed. States he last used cocaine, alcohol, marijuana 3 days ago. Asked pt about blurry vision reported to nurse. Pt denies blurry vision at this time.  ? ?Plan is for inpatient admission. Social work aware and seeking placement. No beds at [facility] available at this time. Pt has been faxed out on 11/27/21. ? ?Diagnosis:  ?Final diagnoses:  ?Substance induced mood disorder ([place])  ?Suicidal ideations  ? ?Total Time spent with patient: 15 minutes ? ?Past Psychiatric History:  ?Past Medical History:  ?Past Medical History:  ?Diagnosis Date  ? 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 AKI (acute kidney injury) ([place]) 03/21/2015  ? Arthritis Dx 2002  ? Diabetes type 2, controlled ([place]) 2010  ? Drug abuse ([place])   ? Headache   ? Hypertension Dx 2002  ? 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 Schizo affective schizophrenia ([place]) 1995  ? Schizophrenia ([place])   ? Seizures ([place])   ? Sleep apnea 1995  ? on CPAP  ?  ?Past Surgical History:  ?Procedure Laterality Date  ? PITUITARY SURGERY  2005 & 2012  ? SKIN BIOPSY    ? ?Family History:  ?Family History  ?Problem Relation Age of Onset  ? Hypertension Mother   ? Diabetes Mother   ? Heart Problems Mother   ? Cancer Maternal Uncle   ? Alcoholism Maternal Uncle   ? Cancer Maternal Grandmother   ? Heart disease Neg Hx   ? ?Family Psychiatric   History: Endorses cousin has dx schizophrenia.  ?Social History:  ?Social History  ? ?Substance and Sexual Activity  ?Alcohol Use Yes  ? Alcohol/week: 2.0 standard drinks  ? Types: 2 Cans of beer per week  ? Comment: 2 cans on workdays; 6-pk on days off; varies  ?   ?Social History  ? ?Substance and Sexual Activity  ?Drug Use Yes  ? Types: Cocaine, Marijuana, Heroin  ? Comment: Reports use 2 grams Cocaine 10/28/21  ?  ?Social History  ? ?Socioeconomic History  ? Marital status: Single  ?  Spouse name: Not on file  ? Number of children: 2  ? Years of education: GED  ? Highest education level: Not on file  ?Occupational History  ? Not on file  ?Tobacco Use  ? Smoking status: Every Day  ?  Packs/day: 1.00  ?  Years: 20.00  ?  Pack years: 20.00  ?  Types: Cigarettes  ? Smokeless tobacco: Never  ? Tobacco comments:  ?  Smoking .5 ppd  ?Substance and Sexual Activity  ? Alcohol use: Yes  ?  Alcohol/week: 2.0 standard drinks  ?  Types: 2 Cans of beer per week  ?  Comment: 2 cans on workdays; 6-pk on days off; varies  ? Drug use: Yes  ?  Types: Cocaine, Marijuana, Heroin  ?  Comment: Reports use 2 grams Cocaine  10/28/21  ? Sexual activity: Yes  ?Other Topics Concern  ? Not on file  ?Social History Narrative  ? Lives with mom.  ? Incarcerated for 22 months in [place], [place]. From 2014-09/2014  ? ?Social Determinants of Health  ? ?Financial Resource Strain: Not on file  ?Food Insecurity: Not on file  ?Transportation Needs: Not on file  ?Physical Activity: Not on file  ?Stress: Not on file  ?Social Connections: Not on file  ? ?SDOH:  ?SDOH Screenings  ? ?Alcohol Screen: Low Risk   ? Last Alcohol Screening Score (AUDIT): 7  ?Depression (PHQ2-9): Medium Risk  ? PHQ-2 Score: 17  ?Financial Resource Strain: Not on file  ?Food Insecurity: Not on file  ?Housing: Not on file  ?Physical Activity: Not on file  ?Social Connections: Not on file  ?Stress: Not on file  ?Tobacco Use: High Risk  ? Smoking Tobacco Use: Every Day  ? Smokeless Tobacco  Use: Never  ? Passive Exposure: Not on file  ?Transportation Needs: Not on file  ? ?Additional Social History:  ?  ?Pain Medications: See MAR ?Prescriptions: See MAR ?Over the Counter: See MAR ?History of alcohol / drug use?: Yes ?Longest period of sobriety (when/how long): 28 days, patient has not used drugs or alcohol in 28 days ?Negative Consequences of Use: Legal, Personal relationships ?Withdrawal Symptoms: None ?1 - Amount (size/oz): states that he has snorted a few lines in the past few days ?1 - Frequency: unknown ?1 - Last Use / Amount: yesterday ?1- Route of Use: snorts ?Name of Substance 2: alcohol ?2 - Age of First Use: Adolescent ?2 - Amount (size/oz): Varies ?2 - Frequency: 2-3 times per week ?2 - Duration: Ongoing ?2 - Last Use / Amount: yesterday ?2 - Method of Aquiring: purchases from store ?2 - Route of Substance Use: oral ?  ?  ?  ?  ?  ?  ?  ? ?Sleep: Good ? ?Appetite:  Good ? ?Current Medications:  ?Current Facility-Administered Medications  ?Medication Dose Route Frequency Provider Last Rate Last Admin  ? acetaminophen (TYLENOL) tablet 650 mg  650 mg Oral Q6H PRN White, Patrice L, NP      ? alum & mag hydroxide-simeth (MAALOX/MYLANTA) 200-200-20 MG/5ML suspension 30 mL  30 mL Oral Q4H PRN White, Patrice L, NP      ? amLODipine (NORVASC) tablet 5 mg  5 mg Oral Daily White, Patrice L, NP   5 mg at 11/27/21 8469  ? benztropine (COGENTIN) tablet 0.5 mg  0.5 mg Oral BID White, Patrice L, NP   0.5 mg at 11/27/21 6295  ? celecoxib (CELEBREX) capsule 200 mg  200 mg Oral Daily White, Patrice L, NP   200 mg at 11/27/21 2841  ? chlorproMAZINE (THORAZINE) tablet 37.5 mg  37.5 mg Oral BID White, Patrice L, NP   37.5 mg at 11/27/21 3244  ? chlorproMAZINE (THORAZINE) tablet 50 mg  50 mg Oral QHS White, Patrice L, NP   50 mg at 11/26/21 2051  ? divalproex (DEPAKOTE) DR tablet 750 mg  750 mg Oral BID White, Patrice L, NP   750 mg at 11/27/21 0102  ? gabapentin (NEURONTIN) capsule 400 mg  400 mg Oral TID White,  Patrice L, NP   400 mg at 11/27/21 7253  ? hydrOXYzine (ATARAX) tablet 25 mg  25 mg Oral TID PRN White, Patrice L, NP      ? losartan (COZAAR) tablet 50 mg  50 mg Oral Daily White, Mellody Life, NP  50 mg at 11/27/21 7564  ? magnesium hydroxide (MILK OF MAGNESIA) suspension 30 mL  30 mL Oral Daily PRN White, Patrice L, NP      ? metFORMIN (GLUCOPHAGE) tablet 500 mg  500 mg Oral Q breakfast White, Patrice L, NP   500 mg at 11/27/21 3329  ? risperiDONE (RISPERDAL) tablet 0.5 mg  0.5 mg Oral Daily White, Patrice L, NP   0.5 mg at 11/27/21 5188  ? risperiDONE (RISPERDAL) tablet 1 mg  1 mg Oral QHS White, Patrice L, NP   1 mg at 11/26/21 2049  ? traZODone (DESYREL) tablet 100 mg  100 mg Oral QHS PRN White, Patrice L, NP      ? ?Current Outpatient Medications  ?Medication Sig Dispense Refill  ? amLODipine (NORVASC) 5 MG tablet Take 1 tablet (5 mg total) by mouth daily. 30 tablet 0  ? benztropine (COGENTIN) 0.5 MG tablet Take 1 tablet (0.5 mg total) by mouth 2 (two) times daily. 60 tablet 0  ? celecoxib (CELEBREX) 200 MG capsule Take 1 capsule (200 mg total) by mouth daily. 30 capsule 0  ? chlorproMAZINE (THORAZINE) 25 MG tablet Take 1.5 tablets (37.5 mg total) by mouth 2 (two) times daily. Take at 0800 and 1200. 90 tablet 0  ? chlorproMAZINE (THORAZINE) 50 MG tablet Take 1 tablet (50 mg total) by mouth at bedtime. 30 tablet 0  ? divalproex (DEPAKOTE) 250 MG DR tablet Take 3 tablets (750 mg total) by mouth 2 (two) times daily. 180 tablet 0  ? ferrous sulfate 325 (65 FE) MG tablet Take 1 tablet by mouth every Monday and Friday (Patient not taking: Reported on 10/28/2021) 100 tablet 1  ? fluticasone (FLONASE) 50 MCG/ACT nasal spray Place 1 spray into both nostrils daily as needed for allergies or rhinitis. 16 g 0  ? gabapentin (NEURONTIN) 400 MG capsule Take 1 capsule (400 mg total) by mouth 3 (three) times daily. 90 capsule 0  ? hydrOXYzine (ATARAX) 25 MG tablet Take 1 tablet (25 mg total) by mouth 3 (three) times daily as  needed for anxiety. 30 tablet 0  ? losartan (COZAAR) 50 MG tablet Take 1 tablet (50 mg total) by mouth daily. 30 tablet 0  ? metFORMIN (GLUCOPHAGE) 500 MG tablet Take 1 tablet (500 mg total) by mouth daily with

## 2021-11-27 NOTE — Progress Notes (Signed)
Patient watching television, has received medications throughout the shift. Patient is cooperative and calm .Nursing staff will continue to monitor. ?

## 2021-11-27 NOTE — Progress Notes (Signed)
Patient has been denied by [facility] and has been faxed out. Patient meets [facility] inpatient per Tharon Aquas, NP. Patient has been faxed out to the following facilities:  ? ? ?[facility]  [address] [name] [address] [phone]  ?[facility]  [address] [place] [address] [phone]  ?[place]  [address] [place] [address] [phone]  ?[place] [address] [place] [address] [phone]  ?[facility]  [place], [place] [address] [phone]  ?[facility]  [place], [place] [address] [phone]  ?[facility]  [address] [place] [address] [phone]  ?[place] [place] [address] [phone]  ?[facility]  [address], [place] [address] [phone]  ?[facility]  [address] [place] [address] [phone]  ? ?Mariea Clonts, MSW, LCSW-A  ?10:42 AM 11/27/2021   ?

## 2021-11-28 ENCOUNTER — Encounter (HOSPITAL_COMMUNITY): Payer: Self-pay | Admitting: Registered Nurse

## 2021-11-28 DIAGNOSIS — F1994 Other psychoactive substance use, unspecified with psychoactive substance-induced mood disorder: Secondary | ICD-10-CM | POA: Diagnosis not present

## 2021-11-28 DIAGNOSIS — R45851 Suicidal ideations: Secondary | ICD-10-CM

## 2021-11-28 NOTE — ED Notes (Signed)
Dinner was given pt had 2 meals ?

## 2021-11-28 NOTE — ED Provider Notes (Signed)
Behavioral Health Progress Note ? ?Date and Time: 11/28/2021 12:50 PM ?Name: Duane Salazar ?MRN:  [id number] ? ?Subjective:   ?Patient presented to [facility] as walk in with complaints of homicidal ideation towards family members and ex-girlfriend.  He was admitted to continuous assessment unit while awaiting appropriate bed for inpatient psychiatric treatment. He was declined at [facility] related to not having appropriate bed.  ? ?Duane Salazar, 46 y.o., male patient seen face to face by this provider, consulted with Dr. Hampton Abbot; and chart reviewed on 11/28/21.  On evaluation Jaylyn Sahli reports he continues to have suicidal and homicidal ideation.  He is unable to contract for safety stating he needs to get his mental health together so that he is able to function at a drug rehab facility.  States "The last time I was discharged from hospital my medicine wasn't working and then they send me to a rehab facility without any samples just a prescription and the facility they sent me to requires you do strenuous labor which I can't.  I told the social worker that and she sent me anyway.  I want to go to [facility] but I have to have 30 days of sample medication and a prescription but I want to make sure my medicine is working so I don't get there and go off hurting someone."  Patient states if he is discharged at this time he knows he will hurt someone.   ?During evaluation Wing Khalsa is standing at nursing desk asking for food.  There appears to be no acute distress.  He is alert, oriented x 4, calm, cooperative and attentive.  His mood is anxious, depressed with congruent affect.  He has normal speech, and behavior.  Objectively there is no evidence of psychosis/mania or delusional thinking.  Patient is able to converse coherently, goal directed thoughts, no distractibility, or pre-occupation.  He also denies psychosis, and paranoia; but continues to endorse suicidal/homicidal ideation.  .  Patient answered  question appropriately.    ? ?Diagnosis:  ?Final diagnoses:  ?Substance induced mood disorder ([place])  ?Suicidal ideations  ? ? ?Total Time spent with patient: 20 minutes ? ?Past Psychiatric History: See below ?Past Medical History:  ?Past Medical History:  ?Diagnosis Date  ? 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 AKI (acute kidney injury) ([place]) 03/21/2015  ? Arthritis Dx 2002  ? Diabetes type 2, controlled ([place]) 2010  ? Drug abuse ([place])   ? Headache   ? Hypertension Dx 2002  ? 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 Schizo affective schizophrenia ([place]) 1995  ? Schizophrenia ([place])   ? Seizures ([place])   ? Sleep apnea 1995  ? on CPAP  ?  ?Past Surgical History:  ?Procedure Laterality Date  ? PITUITARY SURGERY  2005 & 2012  ? SKIN BIOPSY    ? ?Family History:  ?Family History  ?Problem Relation Age of Onset  ? Hypertension Mother   ? Diabetes Mother   ? Heart Problems Mother   ? Cancer Maternal Uncle   ? Alcoholism Maternal Uncle   ? Cancer Maternal Grandmother   ? Heart disease Neg Hx   ? ?Family Psychiatric  History: None reported ?Social History:  ?Social History  ? ?Substance and Sexual Activity  ?Alcohol Use Yes  ? Alcohol/week: 2.0 standard drinks  ? Types: 2 Cans of beer per week  ? Comment: 2 cans on workdays; 6-pk on days off; varies  ?   ?Social History  ? ?Substance and Sexual Activity  ?  Drug Use Yes  ? Types: Cocaine, Marijuana, Heroin  ? Comment: Reports use 2 grams Cocaine 10/28/21  ?  ?Social History  ? ?Socioeconomic History  ? Marital status: Single  ?  Spouse name: Not on file  ? Number of children: 2  ? Years of education: GED  ? Highest education level: Not on file  ?Occupational History  ? Not on file  ?Tobacco Use  ? Smoking status: Every Day  ?  Packs/day: 1.00  ?  Years: 20.00  ?  Pack years: 20.00  ?  Types: Cigarettes  ? Smokeless tobacco: Never  ? Tobacco comments:  ?  Smoking .5 ppd  ?Substance and Sexual Activity  ? Alcohol use: Yes  ?  Alcohol/week: 2.0 standard drinks  ?  Types: 2 Cans of beer per week  ?   Comment: 2 cans on workdays; 6-pk on days off; varies  ? Drug use: Yes  ?  Types: Cocaine, Marijuana, Heroin  ?  Comment: Reports use 2 grams Cocaine 10/28/21  ? Sexual activity: Yes  ?Other Topics Concern  ? Not on file  ?Social History Narrative  ? Lives with mom.  ? Incarcerated for 22 months in [place], [place]. From 2014-09/2014  ? ?Social Determinants of Health  ? ?Financial Resource Strain: Not on file  ?Food Insecurity: Not on file  ?Transportation Needs: Not on file  ?Physical Activity: Not on file  ?Stress: Not on file  ?Social Connections: Not on file  ? ?SDOH:  ?SDOH Screenings  ? ?Alcohol Screen: Low Risk   ? Last Alcohol Screening Score (AUDIT): 7  ?Depression (PHQ2-9): Medium Risk  ? PHQ-2 Score: 17  ?Financial Resource Strain: Not on file  ?Food Insecurity: Not on file  ?Housing: Not on file  ?Physical Activity: Not on file  ?Social Connections: Not on file  ?Stress: Not on file  ?Tobacco Use: High Risk  ? Smoking Tobacco Use: Every Day  ? Smokeless Tobacco Use: Never  ? Passive Exposure: Not on file  ?Transportation Needs: Not on file  ? ?Additional Social History:  ?  ?Pain Medications: See MAR ?Prescriptions: See MAR ?Over the Counter: See MAR ?History of alcohol / drug use?: Yes ?Longest period of sobriety (when/how long): 28 days, patient has not used drugs or alcohol in 28 days ?Negative Consequences of Use: Legal, Personal relationships ?Withdrawal Symptoms: None ?1 - Amount (size/oz): states that he has snorted a few lines in the past few days ?1 - Frequency: unknown ?1 - Last Use / Amount: yesterday ?1- Route of Use: snorts ?Name of Substance 2: alcohol ?2 - Age of First Use: Adolescent ?2 - Amount (size/oz): Varies ?2 - Frequency: 2-3 times per week ?2 - Duration: Ongoing ?2 - Last Use / Amount: yesterday ?2 - Method of Aquiring: purchases from store ?2 - Route of Substance Use: oral ?  ?  ?  ?  ?  ?  ?  ? ?Sleep: Good ? ?Appetite:  Good ? ?Current Medications:  ?Current Facility-Administered  Medications  ?Medication Dose Route Frequency Provider Last Rate Last Admin  ? acetaminophen (TYLENOL) tablet 650 mg  650 mg Oral Q6H PRN White, Patrice L, NP      ? alum & mag hydroxide-simeth (MAALOX/MYLANTA) 200-200-20 MG/5ML suspension 30 mL  30 mL Oral Q4H PRN White, Patrice L, NP      ? amLODipine (NORVASC) tablet 5 mg  5 mg Oral Daily White, Patrice L, NP   5 mg at 11/28/21 1004  ? benztropine (  COGENTIN) tablet 0.5 mg  0.5 mg Oral BID White, Patrice L, NP   0.5 mg at 11/28/21 1004  ? celecoxib (CELEBREX) capsule 200 mg  200 mg Oral Daily White, Patrice L, NP   200 mg at 11/28/21 1003  ? chlorproMAZINE (THORAZINE) tablet 37.5 mg  37.5 mg Oral BID White, Patrice L, NP   37.5 mg at 11/28/21 1231  ? chlorproMAZINE (THORAZINE) tablet 50 mg  50 mg Oral QHS White, Patrice L, NP   50 mg at 11/27/21 2059  ? divalproex (DEPAKOTE) DR tablet 750 mg  750 mg Oral BID White, Patrice L, NP   750 mg at 11/28/21 0851  ? gabapentin (NEURONTIN) capsule 400 mg  400 mg Oral TID White, Patrice L, NP   400 mg at 11/28/21 1003  ? hydrOXYzine (ATARAX) tablet 25 mg  25 mg Oral TID PRN White, Patrice L, NP      ? losartan (COZAAR) tablet 50 mg  50 mg Oral Daily White, Patrice L, NP   50 mg at 11/28/21 1004  ? magnesium hydroxide (MILK OF MAGNESIA) suspension 30 mL  30 mL Oral Daily PRN White, Patrice L, NP      ? metFORMIN (GLUCOPHAGE) tablet 500 mg  500 mg Oral Q breakfast White, Patrice L, NP   500 mg at 11/28/21 7322  ? risperiDONE (RISPERDAL) tablet 0.5 mg  0.5 mg Oral Daily White, Patrice L, NP   0.5 mg at 11/28/21 1004  ? risperiDONE (RISPERDAL) tablet 1 mg  1 mg Oral QHS White, Patrice L, NP   1 mg at 11/27/21 2102  ? traZODone (DESYREL) tablet 100 mg  100 mg Oral QHS PRN White, Patrice L, NP      ? ?Current Outpatient Medications  ?Medication Sig Dispense Refill  ? amLODipine (NORVASC) 5 MG tablet Take 1 tablet (5 mg total) by mouth daily. 30 tablet 0  ? benztropine (COGENTIN) 0.5 MG tablet Take 1 tablet (0.5 mg total) by mouth  2 (two) times daily. 60 tablet 0  ? celecoxib (CELEBREX) 200 MG capsule Take 1 capsule (200 mg total) by mouth daily. 30 capsule 0  ? chlorproMAZINE (THORAZINE) 25 MG tablet Take 1.5 tablets (37.5 mg total) by

## 2021-11-28 NOTE — ED Notes (Signed)
Pt made statement that he was having thoughts of wrapping a sheet around his neck and suicidal thoughts.  When RN stated she would need to remove the linen from his bed for his safety.  Pt became upset stating he confided in me about his feelings and I made him look like a fool.  Pt states he contracted for safety at the beginning of the shift and meant that.  Pt initially refused his night time meds due to being angry, but after further conversation, pt consented to take his meds.  Comfort measures and reassurance given.  Monitoring for safety. ?

## 2021-11-28 NOTE — ED Notes (Signed)
Pts mother also called stating that the pt cannot return to her residence, because she feels unsafe due to his behaviors and drug use.  Mother reports she sleeps with the bed barricaded against the door or other objects for her safety.  Mother reports the pt needs long term treatment. ?

## 2021-11-28 NOTE — ED Notes (Signed)
Breakfast was given 

## 2021-11-28 NOTE — Progress Notes (Signed)
Per Phyllis Ginger Lee,NP, patient meets criteria for inpatient treatment. There are no available or appropriate beds at [facility] today. CSW re-faxed referrals to the following facilities for review: ? ?[place]  Pending - Request Sent N/A [address] [place] [address] [phone] --  ?[place] N/A [address] [place] [address] [phone] --  ?[facility]  Pending - Request Sent N/A [address] [place] [address] [phone] --  ?[facility]  Pending - Request Sent N/A [address], [place] [address] [phone] --  ?[place] N/A [place], [place] [address] [phone] --  ?[facility]  Pending - Request Sent N/A [address] [place] [address] [phone] --  ?[place] [phone] [address] [place] [address] [place] --  ?[facility]  Pending - Request Sent N/A [address] [place] [address] [phone] --  ?[facility]  Pending - Request Sent N/A [address] [place] [address] [phone] --  ?[facility]  Pending - Request Sent N/A [address] [place] [address] [phone] --  ?[place] [address] [place] [address] [phone] --  ?[place] N/A [address] [place] [address] [phone] --  ?[facility] [address] [place] [address] [phone] --  ?[place]  [address] [place] [address] [phone] --  ?[facility]  Pending - Request Sent N/A [address] [place] [address] [phone] --  ?[place] Sent N/A [address] [place], [place] [address] [phone] --  ?[place] [address] [place] [address] [phone] --  ? ?TTS will continue to seek bed placement. ? ?Glennie [place], MSW, LCSW-A, LCAS-A ?Phone: [phone] ?Disposition/TOC ? ?

## 2021-11-29 MED ORDER — CHLORPROMAZINE HCL 50 MG PO TABS: 50.0000 mg | ORAL_TABLET | Freq: Every day | ORAL | 0 refills | Status: DC

## 2021-11-29 MED ORDER — DIVALPROEX SODIUM 250 MG PO DR TAB
750.0000 mg | DELAYED_RELEASE_TABLET | Freq: Two times a day (BID) | ORAL | 0 refills | Status: DC
Start: 2021-11-29 — End: 2021-12-14
  Filled 2021-11-29 – 2021-12-08 (×2): qty 30, 5d supply, fill #0

## 2021-11-29 MED ORDER — CHLORPROMAZINE HCL 25 MG PO TABS
37.5000 mg | ORAL_TABLET | Freq: Two times a day (BID) | ORAL | 0 refills | Status: DC
  Filled 2021-11-29 – 2021-12-08 (×2): qty 60, 20d supply, fill #0

## 2021-11-29 NOTE — ED Notes (Signed)
Pt was given cereal and a muffin for breakfast.  ?

## 2021-11-29 NOTE — Progress Notes (Signed)
Patient resting in reclined chair with respiration unlabored, nursing staff will continue to monitor.  ?

## 2021-11-29 NOTE — ED Notes (Signed)
Pt sleeping at present, no distress noted. Respirations even & unlabored.  Monitoring for safety. 

## 2021-11-29 NOTE — ED Notes (Signed)
Pt sleeping at present, no distress noted, respirations even & unlabored.  Monitoring for safety. ?

## 2021-11-29 NOTE — ED Notes (Signed)
Patient discharged.

## 2021-11-29 NOTE — ED Notes (Signed)
Pt was given potatoes, roast, and green beans for lunch. ?

## 2021-11-29 NOTE — ED Notes (Signed)
Patient discharged with community resources for substance abuse treatment centers/ shelters/ food pantry list. Patient instructed on follow up procedures with [facility], and bed availability. Patient received all belongings from [facility] locker and signed belongings sheet indication of property return. [facility]. Patient educated on medications. Patient received a bus pass for transportation after calling a relative who was not able to pick him up from [facility] location. Patient shown where the nearest bus stop was located.   ?

## 2021-11-29 NOTE — ED Provider Notes (Signed)
FBC/OBS ASAP Discharge Summary ? ?Date and Time: 11/29/2021 12:21 PM  ?Name: Duane Salazar  ?MRN:  [id number]  ? ?Discharge Diagnoses:  ?Final diagnoses:  ?Substance induced mood disorder ([place])  ?Suicidal ideations  ? ? ?Subjective:  Duane Salazar 46 year old African-American male well-known to this service.  He is requesting substance abuse treatment/housing. Markevion stated that he was provided additional output patient resources for [facility], however it was required that he had to be employed in order to stay at that facility.  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  ? ?States " he told me I was going to find me a place to go."  Patient reported he is a resident of [place] and is currently homeless.  ? ?Discussed following up as a walk-in at [facility].  This provider contacted [facility] who states they have open beds. Patient reported " I do not have a ID, and I will give 30-day supply of medications."  ? ?NP will make [facility] resources available and patient was provided with with additional outpatient resources for [employer] program in [place].   ? ?At this time Duane Salazar is educated and verbalizes understanding of mental health resources and other crisis services in the community. He is instructed to call 911 and present to the nearest emergency room should he experience any suicidal/homicidal ideation, auditory/visual/hallucinations, or detrimental worsening of he mental health condition.  He was a also advised by Probation officer that he could call the toll-free phone on insurance card to assist with identifying in network counselors and agencies or number on back of Medicaid card to speak with care coordinator.  ?  ?Stay Summary:  ? ?Total Time spent with patient: 15 minutes ? ?Past Psychiatric History:  ?Past Medical History:  ?Past Medical History:  ?Diagnosis Date  ? 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 AKI (acute kidney injury) ([place]) 03/21/2015  ? Arthritis Dx  2002  ? Diabetes type 2, controlled ([place]) 2010  ? Drug abuse ([place])   ? Headache   ? Hypertension Dx 2002  ? 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 Schizo affective schizophrenia ([place]) 1995  ? Schizophrenia ([place])   ? Seizures ([place])   ? Sleep apnea 1995  ? on CPAP  ?  ?Past Surgical History:  ?Procedure Laterality Date  ? PITUITARY SURGERY  2005 & 2012  ? SKIN BIOPSY    ? ?Family History:  ?Family History  ?Problem Relation Age of Onset  ? Hypertension Mother   ? Diabetes Mother   ? Heart Problems Mother   ? Cancer Maternal Uncle   ? Alcoholism Maternal Uncle   ? Cancer Maternal Grandmother   ? Heart disease Neg Hx   ? ?Family Psychiatric History:  ?Social History:  ?Social History  ? ?Substance and Sexual Activity  ?Alcohol Use Yes  ? Alcohol/week: 2.0 standard drinks  ? Types: 2 Cans of beer per week  ? Comment: 2 cans on workdays; 6-pk on days off; varies  ?   ?Social History  ? ?Substance and Sexual Activity  ?Drug Use Yes  ? Types: Cocaine, Marijuana, Heroin  ? Comment: Reports use 2 grams Cocaine 10/28/21  ?  ?Social History  ? ?Socioeconomic History  ? Marital status: Single  ?  Spouse name: Not on file  ? Number of children: 2  ? Years of education: GED  ? Highest education level: Not on file  ?Occupational History  ? Not on file  ?Tobacco Use  ?  Smoking status: Every Day  ?  Packs/day: 1.00  ?  Years: 20.00  ?  Pack years: 20.00  ?  Types: Cigarettes  ? Smokeless tobacco: Never  ? Tobacco comments:  ?  Smoking .5 ppd  ?Substance and Sexual Activity  ? Alcohol use: Yes  ?  Alcohol/week: 2.0 standard drinks  ?  Types: 2 Cans of beer per week  ?  Comment: 2 cans on workdays; 6-pk on days off; varies  ? Drug use: Yes  ?  Types: Cocaine, Marijuana, Heroin  ?  Comment: Reports use 2 grams Cocaine 10/28/21  ? Sexual activity: Yes  ?Other Topics Concern  ? Not on file  ?Social History Narrative  ? Lives with mom.  ? Incarcerated for 22 months in [place], [place]. From 2014-09/2014  ? ?Social Determinants of Health   ? ?Financial Resource Strain: Not on file  ?Food Insecurity: Not on file  ?Transportation Needs: Not on file  ?Physical Activity: Not on file  ?Stress: Not on file  ?Social Connections: Not on file  ? ?SDOH:  ?SDOH Screenings  ? ?Alcohol Screen: Low Risk   ? Last Alcohol Screening Score (AUDIT): 7  ?Depression (PHQ2-9): Medium Risk  ? PHQ-2 Score: 17  ?Financial Resource Strain: Not on file  ?Food Insecurity: Not on file  ?Housing: Not on file  ?Physical Activity: Not on file  ?Social Connections: Not on file  ?Stress: Not on file  ?Tobacco Use: High Risk  ? Smoking Tobacco Use: Every Day  ? Smokeless Tobacco Use: Never  ? Passive Exposure: Not on file  ?Transportation Needs: Not on file  ? ? ?Tobacco Cessation:  N/A, patient does not currently use tobacco products ? ?Current Medications:  ?Current Facility-Administered Medications  ?Medication Dose Route Frequency Provider Last Rate Last Admin  ? acetaminophen (TYLENOL) tablet 650 mg  650 mg Oral Q6H PRN White, Patrice L, NP      ? alum & mag hydroxide-simeth (MAALOX/MYLANTA) 200-200-20 MG/5ML suspension 30 mL  30 mL Oral Q4H PRN White, Patrice L, NP      ? amLODipine (NORVASC) tablet 5 mg  5 mg Oral Daily White, Patrice L, NP   5 mg at 11/29/21 1016  ? benztropine (COGENTIN) tablet 0.5 mg  0.5 mg Oral BID White, Patrice L, NP   0.5 mg at 11/29/21 1016  ? celecoxib (CELEBREX) capsule 200 mg  200 mg Oral Daily White, Patrice L, NP   200 mg at 11/29/21 1016  ? chlorproMAZINE (THORAZINE) tablet 37.5 mg  37.5 mg Oral BID White, Patrice L, NP   37.5 mg at 11/29/21 0818  ? chlorproMAZINE (THORAZINE) tablet 50 mg  50 mg Oral QHS White, Patrice L, NP   50 mg at 11/28/21 2142  ? divalproex (DEPAKOTE) DR tablet 750 mg  750 mg Oral BID White, Patrice L, NP   750 mg at 11/29/21 0818  ? gabapentin (NEURONTIN) capsule 400 mg  400 mg Oral TID White, Patrice L, NP   400 mg at 11/29/21 1016  ? hydrOXYzine (ATARAX) tablet 25 mg  25 mg Oral TID PRN Darrol Angel L, NP   25 mg at  11/28/21 2142  ? losartan (COZAAR) tablet 50 mg  50 mg Oral Daily White, Patrice L, NP   50 mg at 11/29/21 1016  ? magnesium hydroxide (MILK OF MAGNESIA) suspension 30 mL  30 mL Oral Daily PRN White, Patrice L, NP      ? metFORMIN (GLUCOPHAGE) tablet 500 mg  500 mg Oral  Q breakfast White, Patrice L, NP   500 mg at 11/29/21 0818  ? risperiDONE (RISPERDAL) tablet 0.5 mg  0.5 mg Oral Daily White, Patrice L, NP   0.5 mg at 11/29/21 1016  ? risperiDONE (RISPERDAL) tablet 1 mg  1 mg Oral QHS White, Patrice L, NP   1 mg at 11/28/21 2143  ? traZODone (DESYREL) tablet 100 mg  100 mg Oral QHS PRN White, Patrice L, NP   100 mg at 11/28/21 2145  ? ?Current Outpatient Medications  ?Medication Sig Dispense Refill  ? amLODipine (NORVASC) 5 MG tablet Take 1 tablet (5 mg total) by mouth daily. 30 tablet 0  ? benztropine (COGENTIN) 0.5 MG tablet Take 1 tablet (0.5 mg total) by mouth 2 (two) times daily. 60 tablet 0  ? celecoxib (CELEBREX) 200 MG capsule Take 1 capsule (200 mg total) by mouth daily. 30 capsule 0  ? chlorproMAZINE (THORAZINE) 25 MG tablet Take 1.5 tablets (37.5 mg total) by mouth 2 (two) times daily. 60 tablet 0  ? chlorproMAZINE (THORAZINE) 50 MG tablet Take 1 tablet (50 mg total) by mouth at bedtime. 30 tablet 0  ? divalproex (DEPAKOTE) 250 MG DR tablet Take 3 tablets (750 mg total) by mouth 2 (two) times daily. 180 tablet 0  ? divalproex (DEPAKOTE) 250 MG DR tablet Take 3 tablets (750 mg total) by mouth 2 (two) times daily. 30 tablet 0  ? ferrous sulfate 325 (65 FE) MG tablet Take 1 tablet by mouth every Monday and Friday (Patient not taking: Reported on 10/28/2021) 100 tablet 1  ? fluticasone (FLONASE) 50 MCG/ACT nasal spray Place 1 spray into both nostrils daily as needed for allergies or rhinitis. 16 g 0  ? gabapentin (NEURONTIN) 400 MG capsule Take 1 capsule (400 mg total) by mouth 3 (three) times daily. 90 capsule 0  ? hydrOXYzine (ATARAX) 25 MG tablet Take 1 tablet (25 mg total) by mouth 3 (three) times daily as  needed for anxiety. 30 tablet 0  ? losartan (COZAAR) 50 MG tablet Take 1 tablet (50 mg total) by mouth daily. 30 tablet 0  ? metFORMIN (GLUCOPHAGE) 500 MG tablet Take 1 tablet (500 mg total) by mouth daily with bre

## 2021-11-29 NOTE — Progress Notes (Signed)
Patient is alert and oriented X 4,  with active SI, and auditory hallucinations. Denies HI. Scheduled medications given, healthy appetite, appropriate on unit, no acute behaviors observed at this time. Nursing staff will continue to observed. ?

## 2021-11-29 NOTE — ED Notes (Signed)
Pt was given a hygiene bag. ?

## 2021-11-29 NOTE — Discharge Instructions (Addendum)
Take all medications as prescribed. Keep all follow-up appointments as scheduled.  Do not consume alcohol or use illegal drugs while on prescription medications. Report any adverse effects from your medications to your primary care provider promptly.  In the event of recurrent symptoms or worsening symptoms, call 911, a crisis hotline, or go to the nearest emergency department for evaluation.   

## 2021-11-30 ENCOUNTER — Other Ambulatory Visit: Payer: Self-pay

## 2021-12-01 ENCOUNTER — Other Ambulatory Visit: Payer: Self-pay

## 2021-12-03 ENCOUNTER — Telehealth (HOSPITAL_COMMUNITY): Payer: Self-pay | Admitting: Internal Medicine

## 2021-12-03 NOTE — BH Assessment (Signed)
Care Management - [place] Follow Up Discharges  ? ?Writer attempted to make contact with patient today and was unsuccessful.  The phone number listed inepic is not a working number.   ? ?Per chart review, patient was provided with outpatient substance resources  ?

## 2021-12-07 ENCOUNTER — Other Ambulatory Visit: Payer: Self-pay

## 2021-12-08 ENCOUNTER — Ambulatory Visit (HOSPITAL_COMMUNITY)
Admission: RE | Admit: 2021-12-08 | Discharge: 2021-12-08 | Disposition: A | Discharge status: Home / Self Care | Payer: 59 | Attending: Psychiatry | Admitting: Psychiatry

## 2021-12-08 ENCOUNTER — Encounter (HOSPITAL_COMMUNITY): Payer: Self-pay | Admitting: Emergency Medicine

## 2021-12-08 ENCOUNTER — Other Ambulatory Visit: Payer: Self-pay

## 2021-12-08 ENCOUNTER — Emergency Department (HOSPITAL_COMMUNITY)
Admission: EM | Admit: 2021-12-08 | Discharge: 2021-12-10 | Disposition: A | Discharge status: Home / Self Care | Payer: 59 | Attending: Emergency Medicine | Admitting: Emergency Medicine

## 2021-12-08 DIAGNOSIS — R45850 Homicidal ideations: Secondary | ICD-10-CM | POA: Insufficient documentation

## 2021-12-08 DIAGNOSIS — R451 Restlessness and agitation: Secondary | ICD-10-CM | POA: Diagnosis not present

## 2021-12-08 DIAGNOSIS — R443 Hallucinations, unspecified: Secondary | ICD-10-CM

## 2021-12-08 DIAGNOSIS — F1210 Cannabis abuse, uncomplicated: Secondary | ICD-10-CM | POA: Insufficient documentation

## 2021-12-08 DIAGNOSIS — Z79899 Other long term (current) drug therapy: Secondary | ICD-10-CM | POA: Insufficient documentation

## 2021-12-08 DIAGNOSIS — E1165 Type 2 diabetes mellitus with hyperglycemia: Secondary | ICD-10-CM | POA: Diagnosis not present

## 2021-12-08 DIAGNOSIS — R45851 Suicidal ideations: Secondary | ICD-10-CM | POA: Insufficient documentation

## 2021-12-08 DIAGNOSIS — F17210 Nicotine dependence, cigarettes, uncomplicated: Secondary | ICD-10-CM | POA: Diagnosis not present

## 2021-12-08 DIAGNOSIS — F1410 Cocaine abuse, uncomplicated: Secondary | ICD-10-CM | POA: Diagnosis not present

## 2021-12-08 DIAGNOSIS — I10 Essential (primary) hypertension: Secondary | ICD-10-CM | POA: Diagnosis not present

## 2021-12-08 DIAGNOSIS — F250 Schizoaffective disorder, bipolar type: Secondary | ICD-10-CM | POA: Insufficient documentation

## 2021-12-08 DIAGNOSIS — T1491XA Suicide attempt, initial encounter: Secondary | ICD-10-CM

## 2021-12-08 DIAGNOSIS — Z7984 Long term (current) use of oral hypoglycemic drugs: Secondary | ICD-10-CM | POA: Insufficient documentation

## 2021-12-08 DIAGNOSIS — Z20822 Contact with and (suspected) exposure to covid-19: Secondary | ICD-10-CM | POA: Diagnosis not present

## 2021-12-08 DIAGNOSIS — F209 Schizophrenia, unspecified: Secondary | ICD-10-CM

## 2021-12-08 LAB — RAPID URINE DRUG SCREEN, HOSP PERFORMED
Amphetamines: NOT DETECTED
Barbiturates: NOT DETECTED
Benzodiazepines: NOT DETECTED
Cocaine: POSITIVE — AB
Opiates: NOT DETECTED
Tetrahydrocannabinol: POSITIVE — AB

## 2021-12-08 LAB — VALPROIC ACID LEVEL: Valproic Acid Lvl: 38 ug/mL — ABNORMAL LOW (ref 50.0–100.0)

## 2021-12-08 LAB — COMPREHENSIVE METABOLIC PANEL
ALT: 24 U/L (ref 0–44)
AST: 27 U/L (ref 15–41)
Albumin: 3.6 g/dL (ref 3.5–5.0)
Alkaline Phosphatase: 48 U/L (ref 38–126)
Anion gap: 11 (ref 5–15)
BUN: 20 mg/dL (ref 6–20)
CO2: 18 mmol/L — ABNORMAL LOW (ref 22–32)
Calcium: 9.1 mg/dL (ref 8.9–10.3)
Chloride: 108 mmol/L (ref 98–111)
Creatinine, Ser: 0.91 mg/dL (ref 0.61–1.24)
GFR, Estimated: >60 mL/min (ref >60)
Glucose, Bld: 270 mg/dL — ABNORMAL HIGH (ref 70–99)
Potassium: 4.1 mmol/L (ref 3.5–5.1)
Sodium: 137 mmol/L (ref 135–145)
Total Bilirubin: 0.2 mg/dL — ABNORMAL LOW (ref 0.3–1.2)
Total Protein: 7.7 g/dL (ref 6.5–8.1)

## 2021-12-08 LAB — RESP PANEL BY RT-PCR (FLU A&B, COVID) ARPGX2
Influenza A by PCR: NEGATIVE
Influenza B by PCR: NEGATIVE
SARS Coronavirus 2 by RT PCR: NEGATIVE

## 2021-12-08 LAB — ACETAMINOPHEN LEVEL: Acetaminophen (Tylenol), Serum: <10 ug/mL — ABNORMAL LOW (ref 10–30)

## 2021-12-08 LAB — CBC
HCT: 43.6 % (ref 39.0–52.0)
Hemoglobin: 14 g/dL (ref 13.0–17.0)
MCH: 24.1 pg — ABNORMAL LOW (ref 26.0–34.0)
MCHC: 32.1 g/dL (ref 30.0–36.0)
MCV: 74.9 fL — ABNORMAL LOW (ref 80.0–100.0)
Platelets: 306 10*3/uL (ref 150–400)
RBC: 5.82 MIL/uL — ABNORMAL HIGH (ref 4.22–5.81)
RDW: 16.2 % — ABNORMAL HIGH (ref 11.5–15.5)
WBC: 10.4 10*3/uL (ref 4.0–10.5)
nRBC: 0 % (ref 0.0–0.2)

## 2021-12-08 LAB — SALICYLATE LEVEL: Salicylate Lvl: <7 mg/dL — ABNORMAL LOW (ref 7.0–30.0)

## 2021-12-08 LAB — ETHANOL: Alcohol, Ethyl (B): <10 mg/dL (ref <10)

## 2021-12-08 MED ORDER — BENZTROPINE MESYLATE 0.5 MG PO TABS
0.5000 mg | ORAL_TABLET | Freq: Two times a day (BID) | ORAL | Status: DC
  Administered 2021-12-08 – 2021-12-10 (×4): 0.5 mg via ORAL
  Filled 2021-12-08 (×4): qty 1

## 2021-12-08 MED ORDER — TRAZODONE HCL 100 MG PO TABS
100.0000 mg | ORAL_TABLET | Freq: Every evening | ORAL | Status: DC | PRN
  Administered 2021-12-08 – 2021-12-09 (×2): 100 mg via ORAL
  Filled 2021-12-08 (×2): qty 1

## 2021-12-08 MED ORDER — AMLODIPINE BESYLATE 5 MG PO TABS
5.0000 mg | ORAL_TABLET | Freq: Every day | ORAL | Status: DC
  Administered 2021-12-08 – 2021-12-10 (×3): 5 mg via ORAL
  Filled 2021-12-08 (×3): qty 1

## 2021-12-08 MED ORDER — FLUTICASONE PROPIONATE 50 MCG/ACT NA SUSP
1.0000 | NASAL | Status: DC | PRN
  Filled 2021-12-08: qty 16

## 2021-12-08 MED ORDER — GABAPENTIN 400 MG PO CAPS
400.0000 mg | ORAL_CAPSULE | Freq: Three times a day (TID) | ORAL | Status: DC
  Administered 2021-12-08 – 2021-12-10 (×5): 400 mg via ORAL
  Filled 2021-12-08 (×5): qty 1

## 2021-12-08 MED ORDER — CELECOXIB 200 MG PO CAPS
200.0000 mg | ORAL_CAPSULE | Freq: Every day | ORAL | Status: DC
  Administered 2021-12-08 – 2021-12-10 (×2): 200 mg via ORAL
  Filled 2021-12-08 (×3): qty 1

## 2021-12-08 MED ORDER — LOSARTAN POTASSIUM 50 MG PO TABS
50.0000 mg | ORAL_TABLET | Freq: Every day | ORAL | Status: DC
  Administered 2021-12-09 – 2021-12-10 (×2): 50 mg via ORAL
  Filled 2021-12-08 (×2): qty 2
  Filled 2021-12-08: qty 1

## 2021-12-08 MED ORDER — METFORMIN HCL 500 MG PO TABS
500.0000 mg | ORAL_TABLET | Freq: Every day | ORAL | Status: DC
  Administered 2021-12-09 – 2021-12-10 (×2): 500 mg via ORAL
  Filled 2021-12-08 (×2): qty 1

## 2021-12-08 MED ORDER — RISPERIDONE 0.5 MG PO TABS
0.5000 mg | ORAL_TABLET | Freq: Every day | ORAL | Status: DC
  Administered 2021-12-09 – 2021-12-10 (×2): 0.5 mg via ORAL
  Filled 2021-12-08 (×3): qty 1

## 2021-12-08 MED ORDER — HYDROXYZINE HCL 25 MG PO TABS
25.0000 mg | ORAL_TABLET | Freq: Three times a day (TID) | ORAL | Status: DC | PRN
  Administered 2021-12-09: 25 mg via ORAL
  Filled 2021-12-08: qty 1

## 2021-12-08 MED ORDER — DIVALPROEX SODIUM 500 MG PO DR TAB
750.0000 mg | DELAYED_RELEASE_TABLET | Freq: Two times a day (BID) | ORAL | Status: DC
  Administered 2021-12-08 – 2021-12-10 (×4): 750 mg via ORAL
  Filled 2021-12-08 (×4): qty 1

## 2021-12-08 MED ORDER — RISPERIDONE 1 MG PO TABS
1.0000 mg | ORAL_TABLET | Freq: Every day | ORAL | Status: DC
  Administered 2021-12-08 – 2021-12-09 (×2): 1 mg via ORAL
  Filled 2021-12-08 (×2): qty 1

## 2021-12-08 NOTE — ED Notes (Signed)
Pt changed into purple scrubs and belongings at nurse's station ?

## 2021-12-08 NOTE — Progress Notes (Signed)
Patient has been denied by [facility] due to no appropriate beds available. Patient meets [facility] inpatient criteria per Charmaine Downs, NP. Patient has been faxed out to the following facilities:  ? ?[facility]  [address] [place] [address] [phone]  ?[facility]  [address], [place] [address] [phone]  ?[facility]  [address] [place] [address] [phone]  ?[place] [place] [address] [phone]  ?[facility]  [place], [place] [address] [phone]  ?[facility]  [place], [place] [address] [phone]  ?[facility]  [address] [place] [address] [phone]  ?[facility] [place] [place] [address] [phone]  ?[facility]  [address] [place] [address] [phone]  ?[facility]  [address] [place] [address] [phone]  ? ?Mariea Clonts, MSW, LCSW-A  ?11:10 PM 12/08/2021   ?

## 2021-12-08 NOTE — ED Provider Notes (Signed)
?[facility] DEPT ?Provider Note ? ? ?CSN: [id number] ?Arrival date & time: 12/08/21  1837 ? ?  ? ?History ? ?Chief Complaint  ?Patient presents with  ? Homicidal  ? ? ?Duane Salazar is a 46 y.o. male with chief complaint of "bad thoughts".  Hx of schizoaffective disorder depressive type, diabetes mellitus type II, prior transphenoidal pituitary resection, tobacco use disorder, polysubstance use disorder, and prior suicidal ideation.  Recently voluntarily went to [facility] due to increasing bad thoughts.  Increasing auditory and visual hallucinations.  States he hears voices that tell him to hurt himself, hurt people, shoot people.  Does not currently wish to act on these thoughts, but feels that he is becoming more susceptible to these thoughts.  Admits he attempted to overdose on fentanyl, but that the fentanyl was/out of his hands before he could.  Denies any recent overdose.  Endorses suicidal ideation and planning, and homicidal ideation and planning.  States he has been taking Thorazine as one of his new medications over the last few weeks/months and that this has not been working for him. ? ?The history is provided by the patient and medical records.  ?  ? ?Home Medications ?Prior to Admission medications   ?Medication Sig Start Date End Date Taking? Authorizing Provider  ?amLODipine (NORVASC) 5 MG tablet Take 1 tablet (5 mg total) by mouth daily. 11/12/21   Massengill, Ovid Curd, MD  ?benztropine (COGENTIN) 0.5 MG tablet Take 1 tablet (0.5 mg total) by mouth 2 (two) times daily. 11/11/21   Massengill, Ovid Curd, MD  ?celecoxib (CELEBREX) 200 MG capsule Take 1 capsule (200 mg total) by mouth daily. 11/11/21   Massengill, Ovid Curd, MD  ?chlorproMAZINE (THORAZINE) 25 MG tablet Take 1.5 tablets (37.5 mg total) by mouth 2 (two) times daily. 11/29/21   Derrill Center, NP  ?chlorproMAZINE (THORAZINE) 50 MG tablet Take 1 tablet (50 mg total) by mouth at bedtime. 11/29/21   Derrill Center, NP  ?divalproex  (DEPAKOTE) 250 MG DR tablet Take 3 tablets (750 mg total) by mouth 2 (two) times daily. 11/11/21 12/16/21  Massengill, Ovid Curd, MD  ?divalproex (DEPAKOTE) 250 MG DR tablet Take 3 tablets (750 mg total) by mouth 2 (two) times daily. 11/29/21   Derrill Center, NP  ?ferrous sulfate 325 (65 FE) MG tablet Take 1 tablet by mouth every Monday and Friday ?Patient not taking: Reported on 10/28/2021 09/10/21   Ladell Pier, MD  ?fluticasone [facility]) 50 MCG/ACT nasal spray Place 1 spray into both nostrils daily as needed for allergies or rhinitis. 11/11/21   Massengill, Ovid Curd, MD  ?gabapentin (NEURONTIN) 400 MG capsule Take 1 capsule (400 mg total) by mouth 3 (three) times daily. 11/11/21 01/07/22  Massengill, Ovid Curd, MD  ?hydrOXYzine (ATARAX) 25 MG tablet Take 1 tablet (25 mg total) by mouth 3 (three) times daily as needed for anxiety. 11/11/21   Massengill, Ovid Curd, MD  ?losartan (COZAAR) 50 MG tablet Take 1 tablet (50 mg total) by mouth daily. 11/12/21   Massengill, Ovid Curd, MD  ?metFORMIN (GLUCOPHAGE) 500 MG tablet Take 1 tablet (500 mg total) by mouth daily with breakfast. 11/12/21   Massengill, Ovid Curd, MD  ?predniSONE (DELTASONE) 5 MG tablet take 1 tablet by mouth with breaskfast 11/11/21   Massengill, Ovid Curd, MD  ?risperiDONE (RISPERDAL) 0.5 MG tablet Take 1 tablet (0.5 mg total) by mouth daily. 11/12/21   Massengill, Ovid Curd, MD  ?risperiDONE (RISPERDAL) 1 MG tablet Take 1 tablet (1 mg total) by mouth at bedtime. 11/11/21   Massengill, Ovid Curd,  MD  ?traZODone (DESYREL) 100 MG tablet Take 1 tablet (100 mg total) by mouth at bedtime as needed for sleep. 11/11/21   Janine Limbo, MD  ?   ? ?Allergies    ?Patient has no known allergies.   ? ?Review of Systems   ?Review of Systems  ?Psychiatric/Behavioral:  Positive for hallucinations and suicidal ideas.   ?     Homicidal ideation  ? ?Physical Exam ?Updated Vital Signs ?BP (!) 163/109 (BP Location: Left Arm)   Pulse (!) 104   Temp 98 ?F (36.7 ?C) (Oral)   Resp 19   SpO2 99%   ?Physical Exam ?Vitals and nursing note reviewed.  ?Constitutional:   ?   General: He is not in acute distress. ?   Appearance: He is well-developed. He is not ill-appearing or diaphoretic.  ?HENT:  ?   Head: Normocephalic and atraumatic.  ?   Mouth/Throat:  ?   Mouth: Mucous membranes are moist.  ?   Pharynx: Oropharynx is clear.  ?Eyes:  ?   Conjunctiva/sclera: Conjunctivae normal.  ?Cardiovascular:  ?   Rate and Rhythm: Normal rate and regular rhythm.  ?   Pulses: Normal pulses.  ?   Heart sounds: Normal heart sounds. No murmur heard. ?Pulmonary:  ?   Effort: Pulmonary effort is normal. No respiratory distress.  ?   Breath sounds: Normal breath sounds. No wheezing.  ?Abdominal:  ?   General: Bowel sounds are normal.  ?   Palpations: Abdomen is soft.  ?   Tenderness: There is no abdominal tenderness.  ?Musculoskeletal:     ?   General: No swelling.  ?   Cervical back: Neck supple.  ?Skin: ?   General: Skin is warm and dry.  ?   Capillary Refill: Capillary refill takes less than 2 seconds.  ?Neurological:  ?   Mental Status: He is alert and oriented to person, place, and time.  ?Psychiatric:     ?   Attention and Perception: He perceives auditory and visual hallucinations.     ?   Mood and Affect: Mood normal.     ?   Speech: Speech normal.     ?   Behavior: Behavior is cooperative.     ?   Thought Content: Thought content includes homicidal and suicidal ideation. Thought content includes homicidal and suicidal plan.  ?   Comments:  ?Calm mood with an overall flat affect ?Attempted to overdose with fentanyl, but was unable to follow through due to someone else intervening ?Cooperative ?Describes increasing agitation, increasing hallucinations (especially auditory), and feeling more susceptible to bad thoughts  ? ? ?ED Results / Procedures / Treatments   ?Labs ?(all labs ordered are listed, but only abnormal results are displayed) ?Labs Reviewed  ?COMPREHENSIVE METABOLIC PANEL - Abnormal; Notable for the following  components:  ?    Result Value  ? CO2 18 (*)   ? Glucose, Bld 270 (*)   ? Total Bilirubin 0.2 (*)   ? All other components within normal limits  ?SALICYLATE LEVEL - Abnormal; Notable for the following components:  ? Salicylate Lvl <5.0 (*)   ? All other components within normal limits  ?ACETAMINOPHEN LEVEL - Abnormal; Notable for the following components:  ? Acetaminophen (Tylenol), Serum <10 (*)   ? All other components within normal limits  ?CBC - Abnormal; Notable for the following components:  ? RBC 5.82 (*)   ? MCV 74.9 (*)   ? MCH 24.1 (*)   ?  RDW 16.2 (*)   ? All other components within normal limits  ?RAPID URINE DRUG SCREEN, HOSP PERFORMED - Abnormal; Notable for the following components:  ? Cocaine POSITIVE (*)   ? Tetrahydrocannabinol POSITIVE (*)   ? All other components within normal limits  ?RESP PANEL BY RT-PCR (FLU A&B, COVID) ARPGX2  ?ETHANOL  ?VALPROIC ACID LEVEL  ? ? ?EKG ?EKG Interpretation ? ?Date/Time:  Tuesday December 08 2021 19:48:51 EDT ?Ventricular Rate:  96 ?PR Interval:  156 ?QRS Duration: 92 ?QT Interval:  346 ?QTC Calculation: 438 ?R Axis:   18 ?Text Interpretation: Sinus rhythm Abnormal R-wave progression, early transition Nonspecific T abnormalities, lateral leads Minimal ST elevation, anterior leads Baseline wander in lead(s) I III aVL No significant change since last tracing Confirmed by Wandra Arthurs [phone]) on 12/08/2021 7:53:27 PM ? ?Radiology ?No results found. ? ?Procedures ?Procedures  ? ? ?Medications Ordered in ED ?Medications  ?benztropine (COGENTIN) tablet 0.5 mg (has no administration in time range)  ?gabapentin (NEURONTIN) capsule 400 mg (has no administration in time range)  ?hydrOXYzine (ATARAX) tablet 25 mg (has no administration in time range)  ?traZODone (DESYREL) tablet 100 mg (has no administration in time range)  ?metFORMIN (GLUCOPHAGE) tablet 500 mg (has no administration in time range)  ?losartan (COZAAR) tablet 50 mg (has no administration in time range)   ?amLODipine (NORVASC) tablet 5 mg (has no administration in time range)  ?celecoxib (CELEBREX) capsule 200 mg (has no administration in time range)  ?risperiDONE (RISPERDAL) tablet 0.5 mg (has no administration in tim
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
## 2021-12-08 NOTE — H&P (Signed)
Behavioral Health Medical Screening Exam ? ?Duane Salazar is a 46 y.o. male. With past Psychiatric hx of Schizoaffective disorder, Polysubstance dependence and Substance Induced Mood Disorder.  He walked in with his mother with c/o of irritability, anger, suicide ideation and homicidal ideation towards anybody.   He lives with his mother and mother's boyfriend and lately has been irritable and agitated to the extent his mother advised him to seek help.   He has been keeping a knife in his room  lately and this made his mother advising him to seek mental health care.  His mother accompanied him to [facility] as a walk in.  He reported anger, worthlessness, anxiety, tearfulness and isolating.  He frequently is being seen at [facility] and sent home with outpatient resources.  He reported inpatient hospitalizations four times including in [place] and [facility] he said.  He reported that he wanted to commit suicide yesterday with Fentanyl but a friend stopped him from doing so.  He endorses suicide during the assessment and stated his plan is to use Fentanyl to kill himself.  He has not been taking his Medications in a while because he had no money to buy it  but was able to fill his Thorazine.  He took a dose and started having side effect.  He reported poor sleep but good appetite.  Based on information gathered patient is transferred to [place] for Medical clearance. ? ?Total Time spent with patient: 30 minutes ? ?Psychiatric Specialty Exam: ? ?Presentation  ?General Appearance: Appropriate for Environment; Casual ? ?Eye Contact:Fair ? ?Speech:Clear and Coherent; Normal Rate ? ?Speech Volume:Normal ? ?Handedness:Right ? ? ?Mood and Affect  ?Mood:Anxious; Irritable; Labile; Worthless ? ?Affect:Congruent ? ? ?Thought Process  ?Thought Processes:Coherent ? ?Descriptions of Associations:Intact ? ?Orientation:Full (Time, Place and Person) ? ?Thought Content:Logical ? ?History of  Schizophrenia/Schizoaffective disorder:Yes ? ?Duration of Psychotic Symptoms:Greater than six months ? ?Hallucinations:Hallucinations: Auditory ? ?Ideas of Reference:None ? ?Suicidal Thoughts:Suicidal Thoughts: Yes, Active ?SI Active Intent and/or Plan: With Intent; With Plan; With Means to [place]; With Access to Means ? ?Homicidal Thoughts:Homicidal Thoughts: Yes, Active ? ? ?Sensorium  ?Memory:Immediate Good; Recent Good; Remote Good ? ?Judgment:Poor ? ?Insight:Fair ? ? ?Executive Functions  ?Concentration:Fair ? ?Attention Span:Good ? ?Recall:Good ? ?[place] ? ?Language:Fair ? ? ?Psychomotor Activity  ?Psychomotor Activity:Psychomotor Activity: Normal ? ?Assets  ?Assets:Communication Skills; Housing; Resilience ? ? ?Sleep  ?Sleep:Sleep: Fair ? ? ?Physical Exam: ?Physical Exam ?Constitutional:   ?   Appearance: Normal appearance.  ?HENT:  ?   Head: Normocephalic and atraumatic.  ?   Nose: Nose normal.  ?Cardiovascular:  ?   Rate and Rhythm: Normal rate and regular rhythm.  ?Pulmonary:  ?   Effort: Pulmonary effort is normal.  ?Musculoskeletal:     ?   General: Normal range of motion.  ?   Cervical back: Normal range of motion.  ?Skin: ?   General: Skin is warm and dry.  ?Neurological:  ?   General: No focal deficit present.  ?   Mental Status: He is alert and oriented to person, place, and time.  ? ?Review of Systems  ?Constitutional: Negative.   ?HENT: Negative.    ?Eyes: Negative.   ?Respiratory: Negative.    ?Cardiovascular: Negative.   ?Gastrointestinal: Negative.   ?Genitourinary: Negative.   ?Musculoskeletal: Negative.   ?Skin: Negative.   ?Endo/Heme/Allergies: Negative.   ?Psychiatric/Behavioral:  Positive for substance abuse and suicidal ideas. The patient is nervous/anxious.   ?  Blood pressure (!) 129/91, pulse 98, temperature 98.1 ?F (36.7 ?C), temperature source Oral, resp. rate 18, SpO2 100 %. There is no height or weight on file to calculate BMI. ? ?Musculoskeletal: ?Strength & Muscle  Tone: within normal limits ?Gait & Station: normal ?Patient leans: Right ? ? ?Recommendations:  Patent is well known to the service and See below-Sent to ER.  Based on my evaluation this evening needs further evaluation and  he is being sent to the ER for further evaluation. ? ?Based on my evaluation the patient appears to have an emergency medical condition for which I recommend the patient be transferred to the emergency department for further evaluation. ? ? ?Delfin Gant, NP-PMHNP-BC ?12/08/2021, 6:53 PM ? ?

## 2021-12-08 NOTE — ED Triage Notes (Signed)
Per pt, states he has been having some bad thoughts-states he has been off his meds for 2 weeks due to cost-states he was able to get meds and took a dose today but states he is still anxious in crowded areas and feeling homicidal-feels safe in ED at this time ?

## 2021-12-09 DIAGNOSIS — R45850 Homicidal ideations: Secondary | ICD-10-CM

## 2021-12-09 DIAGNOSIS — R45851 Suicidal ideations: Secondary | ICD-10-CM

## 2021-12-09 LAB — CBG MONITORING, ED: Glucose-Capillary: 155 mg/dL — ABNORMAL HIGH (ref 70–99)

## 2021-12-09 MED ORDER — CHLORPROMAZINE HCL 25 MG PO TABS
37.5000 mg | ORAL_TABLET | Freq: Two times a day (BID) | ORAL | Status: DC
  Administered 2021-12-09 – 2021-12-10 (×2): 37.5 mg via ORAL
  Filled 2021-12-09 (×2): qty 2

## 2021-12-09 MED ORDER — LIDOCAINE VISCOUS HCL 2 % MT SOLN
15.0000 mL | Freq: Once | OROMUCOSAL | Status: AC
  Administered 2021-12-09: 15 mL via OROMUCOSAL
  Filled 2021-12-09 (×2): qty 15

## 2021-12-09 MED ORDER — INSULIN ASPART 100 UNIT/ML IJ SOLN
0.0000 [IU] | Freq: Three times a day (TID) | INTRAMUSCULAR | Status: DC
  Filled 2021-12-09: qty 0.09

## 2021-12-09 MED ORDER — INSULIN ASPART 100 UNIT/ML IJ SOLN
0.0000 [IU] | Freq: Every day | INTRAMUSCULAR | Status: DC
  Filled 2021-12-09: qty 0.05

## 2021-12-09 NOTE — Progress Notes (Signed)
Patient has been faxed out per the request of Dr. Dwyane Dee. Patient meets [place] inpatient criteria per Charmaine Downs, NP. Patient has been faxed out to the following facilities:  ? ?[facility]  [address] [place] [address] [phone]  ?[facility]  [address], [place] [address] [phone]  ?[facility]  [address], [place] [address] [phone]  ?[place] [place] [address] [phone]  ?[facility]  [place], [place] [address] [phone]  ?[facility]  [address], [place] [address] [phone]  ?[facility]  [address] [place] [address] [phone]  ?[facility] [place] [place] [address] [phone]  ?[facility]  [address], [place] [address] [phone]  ?[facility]  [address] [place] [address] [phone]  ? ?Mariea Clonts, MSW, LCSW-A  ?1:19 PM 12/09/2021   ?

## 2021-12-09 NOTE — ED Notes (Addendum)
On rounds beginning of shift, patient stated that he is having thoughts of hurting someone and feels like he is getting ready to have an episode. He is requesting the thorazine he usually takes at home. He is also requesting cough drops for a scratchy throat. Notified Earl Lites. New order for thorazine and lidocaine in epic and given. Will continue to monitor.  ?

## 2021-12-09 NOTE — ED Notes (Addendum)
Patient is refusing getting his blood sugar checked and insulin. He states that he doesn't do this at home. He also states that he takes metformin at home. Notified Earl Lites, DO. ?

## 2021-12-09 NOTE — Consult Note (Signed)
Telepsych Consultation  ? ?Reason for Consult:  Psychiatric Consult ?Referring Physician:  Dr. Allie Bossier ?Location of Patient: [facility] ?Location of Provider: [place] Department ? ?Patient Identification: Duane Salazar ?MRN:  [id number] ?Principal Diagnosis: <principal problem not specified> ?Diagnosis:  Active Problems: ?  * No active hospital problems. * ? ?Total Time spent with patient: 1 hour ? ?Subjective:   ?Duane Salazar is a 46 y.o. male patient admitted to [facility] with increasing agitation, anger, irritability, and polysubstance use disorder/dependence, suicide ideation and homicidal ideation towards anybody. Patient had approximately 10 previous hospitalizations in the past few months. ? ?HPI:  Duane Salazar is a 46 y.o. male with chief complaint of "bad thoughts".  He has psychiatric and medical history of schizoaffective disorder depressive type, diabetes mellitus type II, prior transphenoidal pituitary resection, tobacco use disorder, polysubstance use disorder, and prior suicidal ideation.  As per chart review, he recently voluntarily went to [facility] due to increasing bad thoughts.  Increasing auditory and visual hallucinations.  He lives with his mother and mother's boyfriend and lately has been irritable and agitated to the extent his mother advised him to seek help.   ? ?States he hears voices that tell him to hurt himself, hurt people, and shoot people.  Does not currently wish to act on these thoughts, but feels that he is becoming more susceptible to these thoughts.  Admits he attempted to overdose on fentanyl, but that the fentanyl was taken out of his hands by a friend before he could.  Denies any recent overdose.  Endorses suicidal ideation and planning, and homicidal ideation and planning.  States he has been taking Thorazine as one of his new medications over the last few weeks/months and that this has not been working for him.Marland Kitchen He endorsed good sleep, and good appetite. However would not  commit to safety today. Denies having firearms, however, stated he has been on the streets for a long time and know where to obtain one and shoot himself. ? ?From my evaluation of patient, he is imminent danger to himself and others although he has had multiple hospitalizations in the past. He may benefit from in-patient hospitalization again. [facility] physician and nursing staff made aware of patient disposition. ?  ?Past Psychiatric History: Schizoaffective Disorder, Anxiety, 1 previous Suicide Attempt (jump off bridge), and ~10 previous hospitalizations (latest [facility] 10/30/21). ?  ?Risk to Self:  Yes ?Risk to Others: Yes  ?Prior Inpatient Therapy:  Yes ?Prior Outpatient Therapy: Yes ? ?Past Medical History:  ?Past Medical History:  ?Diagnosis Date  ? 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 AKI (acute kidney injury) ([place]) 03/21/2015  ? Arthritis Dx 2002  ? Diabetes type 2, controlled ([place]) 2010  ? Drug abuse ([place])   ? Headache   ? Hypertension Dx 2002  ? 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 Schizo affective schizophrenia ([place]) 1995  ? Schizophrenia ([place])   ? Seizures ([place])   ? Sleep apnea 1995  ? on CPAP  ?  ?Past Surgical History:  ?Procedure Laterality Date  ? PITUITARY SURGERY  2005 & 2012  ? SKIN BIOPSY    ? ?Family History:  ?Family History  ?Problem Relation Age of Onset  ? Hypertension Mother   ? Diabetes Mother   ? Heart Problems Mother   ? Cancer Maternal Uncle   ? Alcoholism Maternal Uncle   ? Cancer Maternal Grandmother   ? Heart disease Neg Hx   ? ?Family Psychiatric  History: No Known Diagnosis or Suicides. ?Social History:  ?  Social History  ? ?Substance and Sexual Activity  ?Alcohol Use Yes  ? Alcohol/week: 2.0 standard drinks  ? Types: 2 Cans of beer per week  ? Comment: 2 cans on workdays; 6-pk on days off; varies  ?   ?Social History  ? ?Substance and Sexual Activity  ?Drug Use Yes  ? Types: Cocaine, Marijuana, Heroin  ? Comment: Reports use 2 grams Cocaine 10/28/21  ?  ?Social History  ? ?Socioeconomic History  ? Marital  status: Single  ?  Spouse name: Not on file  ? Number of children: 2  ? Years of education: GED  ? Highest education level: Not on file  ?Occupational History  ? Not on file  ?Tobacco Use  ? Smoking status: Every Day  ?  Packs/day: 1.00  ?  Years: 20.00  ?  Pack years: 20.00  ?  Types: Cigarettes  ? Smokeless tobacco: Never  ? Tobacco comments:  ?  Smoking .5 ppd  ?Substance and Sexual Activity  ? Alcohol use: Yes  ?  Alcohol/week: 2.0 standard drinks  ?  Types: 2 Cans of beer per week  ?  Comment: 2 cans on workdays; 6-pk on days off; varies  ? Drug use: Yes  ?  Types: Cocaine, Marijuana, Heroin  ?  Comment: Reports use 2 grams Cocaine 10/28/21  ? Sexual activity: Yes  ?Other Topics Concern  ? Not on file  ?Social History Narrative  ? Lives with mom.  ? Incarcerated for 22 months in [place], [place]. From 2014-09/2014  ? ?Social Determinants of Health  ? ?Financial Resource Strain: Not on file  ?Food Insecurity: Not on file  ?Transportation Needs: Not on file  ?Physical Activity: Not on file  ?Stress: Not on file  ?Social Connections: Not on file  ? ?Additional Social History: ?  ?Allergies:  No Known Allergies ? ?Labs:  ?Results for orders placed or performed during the hospital encounter of 12/08/21 (from the past 48 hour(s))  ?Comprehensive metabolic panel     Status: Abnormal  ? Collection Time: 12/08/21  7:21 PM  ?Result Value Ref Range  ? Sodium 137 135 - 145 mmol/L  ? Potassium 4.1 3.5 - 5.1 mmol/L  ? Chloride 108 98 - 111 mmol/L  ? CO2 18 (L) 22 - 32 mmol/L  ? Glucose, Bld 270 (H) 70 - 99 mg/dL  ?  Comment: Glucose reference range applies only to samples taken after fasting for at least 8 hours.  ? BUN 20 6 - 20 mg/dL  ? Creatinine, Ser 0.91 0.61 - 1.24 mg/dL  ? Calcium 9.1 8.9 - 10.3 mg/dL  ? Total Protein 7.7 6.5 - 8.1 g/dL  ? Albumin 3.6 3.5 - 5.0 g/dL  ? AST 27 15 - 41 U/L  ? ALT 24 0 - 44 U/L  ? Alkaline Phosphatase 48 38 - 126 U/L  ? Total Bilirubin 0.2 (L) 0.3 - 1.2 mg/dL  ? GFR, Estimated >60 >60 mL/min   ?  Comment: (NOTE) ?Calculated using the CKD-EPI Creatinine Equation (2021) ?  ? Anion gap 11 5 - 15  ?  Comment: Performed at [facility], [place] [address] [place], [place] [address]  ?Ethanol     Status: None  ? Collection Time: 12/08/21  7:21 PM  ?Result Value Ref Range  ? Alcohol, Ethyl (B) <10 <10 mg/dL  ?  Comment: (NOTE) ?Lowest detectable limit for serum alcohol is 10 mg/dL. ? ?For medical purposes only. ?Performed at [facility], [address] ?[place], [place] [address] ?  ?Salicylate level     Status: Abnormal  ? Collection Time: 12/08/21  7:21 PM  ?Result Value Ref Range  ? Salicylate Lvl <9.1 (L) 7.0 - 30.0 mg/dL  ?  Comment: Performed at [facility], [place] [address] [place], [place] [address]  ?Acetaminophen level     Status: Abnormal  ? Collection Time: 12/08/21  7:21 PM  ?Result Value Ref Range  ? Acetaminophen (Tylenol), Serum <10 (L) 10 - 30 ug/mL  ?  Comment: (NOTE) ?Therapeutic concentrations vary significantly. A range of 10-30 ug/mL  ?may be an effective concentration for many patients. However, some  ?are best treated at concentrations outside of this range. ?Acetaminophen concentrations >150 ug/mL at 4 hours after ingestion  ?and >50 ug/mL at 12 hours after ingestion are often associated with  ?toxic reactions. ? ?Performed at [facility], [address] ?[place], [place] [address] ?  ?cbc     Status: Abnormal  ? Collection Time: 12/08/21  7:21 PM  ?Result Value Ref Range  ? WBC 10.4 4.0 - 10.5 K/uL  ? RBC 5.82 (H) 4.22 - 5.81 MIL/uL  ? Hemoglobin 14.0 13.0 - 17.0 g/dL  ? HCT 43.6 39.0 - 52.0 %  ? MCV 74.9 (L) 80.0 - 100.0 fL  ? MCH 24.1 (L) 26.0 - 34.0 pg  ? MCHC 32.1 30.0 - 36.0 g/dL  ? RDW 16.2 (H) 11.5 - 15.5 %  ? Platelets 306 150 - 400 K/uL  ? nRBC 0.0 0.0 - 0.2 %  ?  Comment: Performed at [facility], [place] [address] [place], [place] [address]  ?Rapid urine drug screen (hospital  performed)     Status: Abnormal  ? Collection Time: 12/08/21  7:21 PM  ?Result Value Ref Range  ? Opiates NONE DETECTED NONE DETECTED  ? Cocaine POSITIVE (A) NONE DETECTED  ? Benzodiazepines NONE DETECTED NONE D

## 2021-12-09 NOTE — Progress Notes (Signed)
Inpatient Diabetes Program Recommendations ? ?AACE/ADA: New Consensus Statement on Inpatient Glycemic Control  ?Target Ranges:  Prepandial:   less than 140 mg/dL ?     Peak postprandial:   less than 180 mg/dL (1-2 hours) ?     Critically ill patients:  140 - 180 mg/dL  ? ? Latest Reference Range & Units 12/08/21 19:21  ?Glucose 70 - 99 mg/dL 270 (H)  ? ? ?Review of Glycemic Control ? ?Diabetes history: DM2 ?Outpatient Diabetes medications: Metformin 500 mg QAM ?Current orders for Inpatient glycemic control: Metformin 500 mg QAM ? ?Inpatient Diabetes Program Recommendations:   ? ?Insulin: While holding in the ED, please consider ordering CBGs AC&HS with Novolog 0-9 units TID with meals and Novolog 0-5 units QHS. ? ?Thanks, ?Barnie Alderman, RN, MSN, CDE ?Diabetes Coordinator ?Inpatient Diabetes Program ?[phone] (Team Pager from 8am to 5pm) ? ? ? ?

## 2021-12-09 NOTE — ED Provider Notes (Signed)
Emergency Medicine Observation Re-evaluation Note ? ?Duane Salazar is a 46 y.o. male, seen on rounds today.  Pt initially presented to the ED for complaints of Homicidal and Psychiatric Evaluation ?Currently, the patient is sleeping. ? ?Physical Exam  ?BP (!) 141/108 (BP Location: Right Arm)   Pulse 74   Temp 98 ?F (36.7 ?C) (Oral)   Resp 17   SpO2 100%  ?Physical Exam ?General: Sleeping, nondistressed ?Cardiac: Extremities well-perfused ?Lungs: Breathing is even and unlabored ?Psych: Deferred ? ?ED Course / MDM  ?EKG:EKG Interpretation ? ?Date/Time:  Tuesday December 08 2021 19:48:51 EDT ?Ventricular Rate:  96 ?PR Interval:  156 ?QRS Duration: 92 ?QT Interval:  346 ?QTC Calculation: 438 ?R Axis:   18 ?Text Interpretation: Sinus rhythm Abnormal R-wave progression, early transition Nonspecific T abnormalities, lateral leads Minimal ST elevation, anterior leads Baseline wander in lead(s) I III aVL No significant change since last tracing Confirmed by Wandra Arthurs [phone]) on 12/08/2021 7:53:27 PM ? ?I have reviewed the labs performed to date as well as medications administered while in observation.  Recent changes in the last 24 hours include patient presented to the ED last night for command hallucinations telling him to hurt others.  TTS has evaluated and recommends inpatient treatment. ? ?Plan  ?Current plan is for inpatient treatment. ? Duane Salazar is not under involuntary commitment. ? ? ?  ?Godfrey Pick, MD ?12/09/21 1136 ? ?

## 2021-12-10 ENCOUNTER — Inpatient Hospital Stay (HOSPITAL_COMMUNITY)
Admission: AD | Admit: 2021-12-10 | Discharge: 2021-12-14 | DRG: 885 | Disposition: A | Discharge status: Home / Self Care | Payer: 59 | Source: Intra-hospital | Attending: Psychiatry | Admitting: Psychiatry

## 2021-12-10 ENCOUNTER — Encounter (HOSPITAL_COMMUNITY): Payer: Self-pay | Admitting: Nurse Practitioner

## 2021-12-10 ENCOUNTER — Other Ambulatory Visit: Payer: Self-pay

## 2021-12-10 DIAGNOSIS — F332 Major depressive disorder, recurrent severe without psychotic features: Secondary | ICD-10-CM | POA: Diagnosis not present

## 2021-12-10 DIAGNOSIS — Z6841 Body Mass Index (BMI) 40.0 and over, adult: Secondary | ICD-10-CM | POA: Diagnosis not present

## 2021-12-10 DIAGNOSIS — E220 Acromegaly and pituitary gigantism: Secondary | ICD-10-CM | POA: Diagnosis present

## 2021-12-10 DIAGNOSIS — F251 Schizoaffective disorder, depressive type: Principal | ICD-10-CM | POA: Diagnosis present

## 2021-12-10 DIAGNOSIS — F1210 Cannabis abuse, uncomplicated: Secondary | ICD-10-CM | POA: Diagnosis present

## 2021-12-10 DIAGNOSIS — F4010 Social phobia, unspecified: Secondary | ICD-10-CM | POA: Diagnosis present

## 2021-12-10 DIAGNOSIS — J309 Allergic rhinitis, unspecified: Secondary | ICD-10-CM | POA: Diagnosis present

## 2021-12-10 DIAGNOSIS — R45851 Suicidal ideations: Secondary | ICD-10-CM | POA: Diagnosis present

## 2021-12-10 DIAGNOSIS — F17210 Nicotine dependence, cigarettes, uncomplicated: Secondary | ICD-10-CM | POA: Diagnosis present

## 2021-12-10 DIAGNOSIS — G4700 Insomnia, unspecified: Secondary | ICD-10-CM | POA: Diagnosis present

## 2021-12-10 DIAGNOSIS — Z79899 Other long term (current) drug therapy: Secondary | ICD-10-CM

## 2021-12-10 DIAGNOSIS — Z7984 Long term (current) use of oral hypoglycemic drugs: Secondary | ICD-10-CM | POA: Diagnosis not present

## 2021-12-10 DIAGNOSIS — I10 Essential (primary) hypertension: Secondary | ICD-10-CM | POA: Diagnosis present

## 2021-12-10 DIAGNOSIS — R45850 Homicidal ideations: Secondary | ICD-10-CM | POA: Diagnosis present

## 2021-12-10 DIAGNOSIS — Z9151 Personal history of suicidal behavior: Secondary | ICD-10-CM | POA: Diagnosis not present

## 2021-12-10 DIAGNOSIS — F1420 Cocaine dependence, uncomplicated: Secondary | ICD-10-CM | POA: Diagnosis present

## 2021-12-10 DIAGNOSIS — F250 Schizoaffective disorder, bipolar type: Secondary | ICD-10-CM | POA: Diagnosis not present

## 2021-12-10 DIAGNOSIS — E119 Type 2 diabetes mellitus without complications: Secondary | ICD-10-CM | POA: Diagnosis present

## 2021-12-10 DIAGNOSIS — F411 Generalized anxiety disorder: Secondary | ICD-10-CM

## 2021-12-10 LAB — GLUCOSE, CAPILLARY: Glucose-Capillary: 194 mg/dL — ABNORMAL HIGH (ref 70–99)

## 2021-12-10 LAB — CBG MONITORING, ED
Glucose-Capillary: 171 mg/dL — ABNORMAL HIGH (ref 70–99)
Glucose-Capillary: 176 mg/dL — ABNORMAL HIGH (ref 70–99)

## 2021-12-10 LAB — POC SARS CORONAVIRUS 2 AG -  ED: SARSCOV2ONAVIRUS 2 AG: NEGATIVE

## 2021-12-10 MED ORDER — INSULIN ASPART 100 UNIT/ML IJ SOLN: 0.0000 [IU] | Freq: Every day | INTRAMUSCULAR | Status: DC

## 2021-12-10 MED ORDER — METFORMIN HCL 500 MG PO TABS
500.0000 mg | ORAL_TABLET | Freq: Every day | ORAL | Status: DC
  Administered 2021-12-11 – 2021-12-12 (×2): 500 mg via ORAL
  Filled 2021-12-10 (×4): qty 1

## 2021-12-10 MED ORDER — MENTHOL 3 MG MT LOZG
1.0000 | LOZENGE | OROMUCOSAL | Status: DC | PRN
  Administered 2021-12-10 – 2021-12-11 (×2): 3 mg via ORAL
  Filled 2021-12-10 (×4): qty 9

## 2021-12-10 MED ORDER — ALUM & MAG HYDROXIDE-SIMETH 200-200-20 MG/5ML PO SUSP: 30.0000 mL | ORAL | Status: DC | PRN

## 2021-12-10 MED ORDER — LOSARTAN POTASSIUM 50 MG PO TABS
50.0000 mg | ORAL_TABLET | Freq: Every day | ORAL | Status: DC
  Administered 2021-12-11 – 2021-12-14 (×4): 50 mg via ORAL
  Filled 2021-12-10 (×5): qty 1

## 2021-12-10 MED ORDER — CHLORPROMAZINE HCL 25 MG/ML IJ SOLN
50.0000 mg | Freq: Three times a day (TID) | INTRAMUSCULAR | Status: DC | PRN
  Administered 2021-12-12: 50 mg via INTRAMUSCULAR
  Filled 2021-12-10: qty 2

## 2021-12-10 MED ORDER — BENZTROPINE MESYLATE 0.5 MG PO TABS
0.5000 mg | ORAL_TABLET | Freq: Two times a day (BID) | ORAL | Status: DC
  Administered 2021-12-10 – 2021-12-14 (×8): 0.5 mg via ORAL
  Filled 2021-12-10 (×11): qty 1

## 2021-12-10 MED ORDER — CHLORPROMAZINE HCL 50 MG PO TABS
50.0000 mg | ORAL_TABLET | Freq: Three times a day (TID) | ORAL | Status: DC | PRN
  Administered 2021-12-10: 50 mg via ORAL
  Filled 2021-12-10: qty 1

## 2021-12-10 MED ORDER — CHLORPROMAZINE HCL 25 MG PO TABS
25.0000 mg | ORAL_TABLET | Freq: Two times a day (BID) | ORAL | Status: DC
  Administered 2021-12-10 – 2021-12-11 (×2): 25 mg via ORAL
  Filled 2021-12-10 (×7): qty 1

## 2021-12-10 MED ORDER — RISPERIDONE 1 MG PO TABS
1.0000 mg | ORAL_TABLET | Freq: Every day | ORAL | Status: DC
  Administered 2021-12-10: 1 mg via ORAL
  Filled 2021-12-10 (×3): qty 1

## 2021-12-10 MED ORDER — TRAZODONE HCL 100 MG PO TABS
100.0000 mg | ORAL_TABLET | Freq: Every evening | ORAL | Status: DC | PRN
  Administered 2021-12-10 – 2021-12-13 (×4): 100 mg via ORAL
  Filled 2021-12-10 (×5): qty 1
  Filled 2021-12-10: qty 7

## 2021-12-10 MED ORDER — DIVALPROEX SODIUM 500 MG PO DR TAB
750.0000 mg | DELAYED_RELEASE_TABLET | Freq: Two times a day (BID) | ORAL | Status: DC
  Administered 2021-12-10 – 2021-12-14 (×8): 750 mg via ORAL
  Filled 2021-12-10 (×13): qty 1

## 2021-12-10 MED ORDER — HYDROXYZINE HCL 25 MG PO TABS
25.0000 mg | ORAL_TABLET | Freq: Three times a day (TID) | ORAL | Status: DC | PRN
  Administered 2021-12-10 – 2021-12-13 (×3): 25 mg via ORAL
  Filled 2021-12-10 (×3): qty 1
  Filled 2021-12-10: qty 10

## 2021-12-10 MED ORDER — ACETAMINOPHEN 325 MG PO TABS
650.0000 mg | ORAL_TABLET | Freq: Four times a day (QID) | ORAL | Status: DC | PRN
  Administered 2021-12-11: 650 mg via ORAL
  Filled 2021-12-10: qty 2

## 2021-12-10 MED ORDER — AMLODIPINE BESYLATE 5 MG PO TABS
5.0000 mg | ORAL_TABLET | Freq: Every day | ORAL | Status: DC
  Administered 2021-12-11 – 2021-12-12 (×2): 5 mg via ORAL
  Filled 2021-12-10 (×4): qty 1

## 2021-12-10 MED ORDER — GABAPENTIN 400 MG PO CAPS
400.0000 mg | ORAL_CAPSULE | Freq: Three times a day (TID) | ORAL | Status: DC
  Administered 2021-12-10 – 2021-12-14 (×11): 400 mg via ORAL
  Filled 2021-12-10 (×15): qty 1

## 2021-12-10 MED ORDER — MAGNESIUM HYDROXIDE 400 MG/5ML PO SUSP: 30.0000 mL | Freq: Every day | ORAL | Status: DC | PRN

## 2021-12-10 MED ORDER — CELECOXIB 100 MG PO CAPS
200.0000 mg | ORAL_CAPSULE | Freq: Every day | ORAL | Status: DC
  Administered 2021-12-11 – 2021-12-14 (×4): 200 mg via ORAL
  Filled 2021-12-10 (×5): qty 1

## 2021-12-10 MED ORDER — FLUTICASONE PROPIONATE 50 MCG/ACT NA SUSP: 1.0000 | NASAL | Status: DC | PRN

## 2021-12-10 NOTE — Progress Notes (Addendum)
Pt is a  46 y/o [place] American male with history of history of schizoaffective disorder (Bipolar Type), MDD, and polysubstance use (marijuana, alcohol, and cocaine, and tobacco use). Per chart review and nursing report pt presented to [facility] with complaints of SI with plant to overdose on fentanyl / HI towards his mom. Pt presents A & O X4 with blunted affect, labile mood, agitated and verbally abusive towards staff on initial encounter. Continues to endorse SI "I am still suicidal. I was having having plan to overdose on Fentanyl, shoot myself or run into traffic. I've been off my medicines for 2 weeks now. I used cocaine, meth and weed about 1 gram each 1.5 week ago. I didn't have money to get my medicines" when asked of events leading to admission. Pt verbally contracts for safety. Endorsed +AVH "I was hearing voices, conversation in my head and seeing objects move across the floors". Reports he's currently homeless but per chart, he review he lives with his mom. Reports he's been sleeping well with good appetite. Rates his anxiety and depression both 8/10 with current stressors being his "polysubstance abuse and my mental health". Skin is dry and intact without areas of breakdown. Multiple tattoos noted on chest, bilateral arms, upper & mid back. Belongings searched and items deemed contraband secured in locker (lighter & pack of cigarettes). Ambulatory to unit with steady & slow gait. Unit orientation done, routines discussed, care plan reviewed and admission documents signed. Q 15 minutes safety checks initiated without self harm gestures. Emotional support and reassurance provided to pt. Encouraged pt to voice concerns. Sandwich tray and fluids offered, tolerated well. Pt denies concerns at this time.   ?

## 2021-12-10 NOTE — Tx Team (Signed)
Initial Treatment Plan ?12/10/2021 ?3:28 PM ?Duane Salazar ?HGD:[id number] ? ? ? ?PATIENT STRESSORS: ?Financial difficulties   ?Medication change or noncompliance   ?Substance abuse   ? ? ?PATIENT STRENGTHS: ?Capable of independent living  ?Communication skills  ?Religious Affiliation  ?Supportive family/friends  ? ? ?PATIENT IDENTIFIED PROBLEMS: ?Risk for self harm "I'm suicidal. I have plan to overdose on Fentanyl, run into traffic or shoot myself".  ?  ?Polysubstance abuse "I used cocaine, meth & weed about 1 gram each, 1.5 weeks ago".  ?  ?Altered Perception "I was hearing voices, conversations & seeing objects / images move across the floor".  ?  ?  ?  ?  ?  ? ?DISCHARGE CRITERIA:  ?Improved stabilization in mood, thinking, and/or behavior ?Verbal commitment to aftercare and medication compliance ? ?PRELIMINARY DISCHARGE PLAN: ?Outpatient therapy ?Placement in alternative living arrangements ? ?PATIENT/FAMILY INVOLVEMENT: ?This treatment plan has been presented to and reviewed with the patient, Duane Salazar. The patient have been given the opportunity to ask questions and make suggestions. ? ?Keane Police, RN ?12/10/2021, 3:28 PM ?

## 2021-12-10 NOTE — ED Notes (Signed)
Report called to [facility] 

## 2021-12-10 NOTE — ED Provider Notes (Signed)
Emergency Medicine Observation Re-evaluation Note ? ?Travion Rekowski is a 46 y.o. male, seen on rounds today.  Pt initially presented to the ED for complaints of Homicidal and Psychiatric Evaluation ?Currently, the patient is sleeping. ? ?Physical Exam  ?BP 134/87 (BP Location: Right Arm)   Pulse 64   Temp (!) 97.4 ?F (36.3 ?C) (Oral)   Resp 18   SpO2 97%  ?Physical Exam ?General: No acute distress ?Cardiac: Well-perfused ?Lungs: Nonlabored ?Psych: Intermittently agitated ? ?ED Course / MDM  ?EKG:EKG Interpretation ? ?Date/Time:  Tuesday December 08 2021 19:48:51 EDT ?Ventricular Rate:  96 ?PR Interval:  156 ?QRS Duration: 92 ?QT Interval:  346 ?QTC Calculation: 438 ?R Axis:   18 ?Text Interpretation: Sinus rhythm Abnormal R-wave progression, early transition Nonspecific T abnormalities, lateral leads Minimal ST elevation, anterior leads Baseline wander in lead(s) I III aVL No significant change since last tracing Confirmed by Wandra Arthurs [phone]) on 12/08/2021 7:53:27 PM ? ?I have reviewed the labs performed to date as well as medications administered while in observation.  Recent changes in the last 24 hours include psychiatric evaluation. ? ?Plan  ?Current plan is for inpatient placement. ? Eric Sthilaire is not under involuntary commitment. ? ?11 AM patient has been accepted to behavioral health [facility] under Dr. Caswell Corwin.  We will transport via safe transport. ? ? ?  ?Hayden Rasmussen, MD ?12/10/21 1731 ? ?

## 2021-12-10 NOTE — ED Notes (Signed)
Safe transport called 

## 2021-12-10 NOTE — Consult Note (Signed)
Patient is being admitted this morning.  He remains homicidal-to kill people especially former co-workers.  He remains suicidal- plan includes trying any means available including prompting Police to kill him. ? ?

## 2021-12-10 NOTE — BH Assessment (Signed)
[name] ?  ?Per Charmaine Downs, NP, this pt requires psychiatric hospitalization at this time.  Danika, RN, [facility] has [facility] pt to [facility] Rm [id number] to the service of Dr Caswell Corwin.  EDP Aletta Edouard, MD and pt's nurse, Lisette Grinder, have been notified.  Duane Salazar agrees to have pt sign consents, fax signed forms to [phone], send original paperwork along with pt via Safe Transport, and to call report to [phone]. ? ?Jalene Mullet, MA ?Behavioral Health Coordinator ?[phone] ? ?

## 2021-12-10 NOTE — [facility] Group Notes (Signed)
Adult Psychoeducational Group Note ? ?Date:  12/10/2021 ?Time:  8:59 PM ? ?Group Topic/Focus:  ?Wrap-Up Group:   The focus of this group is to help patients review their daily goal of treatment and discuss progress on daily workbooks. ? ?Participation Level:  Minimal ? ?Participation Quality:  Appropriate ? ?Affect:  Appropriate ? ?Cognitive:  Appropriate ? ?Insight: Appropriate ? ?Engagement in Group:  Engaged ? ?Modes of Intervention:  Activity ? ?Additional Comments:  Pt attended the evening wrap-up group. Tech introduced the staff for the evening, reminded group of the evening schedule and reminded them to ask for anything they need. Pt participated in progressive muscle relaxation to explore how tension and stress is held in the body. The activity also allowed the pt to work on their ability to Neurosurgeon. Pt was given the opportunity to express how it felt to work on progressive muscle relaxation. Pt declined to share.Group ended with a reminder of the rest of the evening schedule. ? ?Amie Critchley ?12/10/2021, 8:59 PM ?

## 2021-12-11 ENCOUNTER — Encounter (HOSPITAL_COMMUNITY): Payer: Self-pay

## 2021-12-11 DIAGNOSIS — F332 Major depressive disorder, recurrent severe without psychotic features: Secondary | ICD-10-CM

## 2021-12-11 LAB — GLUCOSE, CAPILLARY
Glucose-Capillary: 129 mg/dL — ABNORMAL HIGH (ref 70–99)
Glucose-Capillary: 172 mg/dL — ABNORMAL HIGH (ref 70–99)
Glucose-Capillary: 212 mg/dL — ABNORMAL HIGH (ref 70–99)

## 2021-12-11 MED ORDER — CHLORPROMAZINE HCL 50 MG PO TABS
50.0000 mg | ORAL_TABLET | Freq: Three times a day (TID) | ORAL | Status: DC
  Administered 2021-12-11 – 2021-12-12 (×3): 50 mg via ORAL
  Filled 2021-12-11 (×8): qty 1

## 2021-12-11 MED ORDER — MENTHOL 3 MG MT LOZG: 1.0000 | LOZENGE | Freq: Four times a day (QID) | OROMUCOSAL | Status: DC

## 2021-12-11 MED ORDER — CHLORPROMAZINE HCL 50 MG PO TABS
50.0000 mg | ORAL_TABLET | Freq: Once | ORAL | Status: AC
  Administered 2021-12-11: 50 mg via ORAL
  Filled 2021-12-11 (×2): qty 1

## 2021-12-11 MED ORDER — MENTHOL 3 MG MT LOZG
1.0000 | LOZENGE | Freq: Four times a day (QID) | OROMUCOSAL | Status: DC | PRN
  Administered 2021-12-12 – 2021-12-13 (×2): 3 mg via ORAL
  Filled 2021-12-11 (×2): qty 9

## 2021-12-11 NOTE — [facility] Group Notes (Signed)
[place] Group Notes:  (Nursing/MHT/Case Management/Adjunct) ? ?Date:  12/11/2021  ?Time:  2:56 PM ? ?Type of Therapy:  Psychoeducational Skills ? ?Participation Level:  Active ? ?Participation Quality:  Appropriate ? ?Affect:  Appropriate ? ?Cognitive:  Appropriate ? ?Insight:  Appropriate ? ?Engagement in Group:  Engaged ? ?Modes of Intervention:  Education ? ?Summary of Progress/Problems:Worked with patient on how to express his needs to the nursing staff about getting his medication. Patient refused to listen to suggestions and said "forget about it." ? ?Jerrye Beavers ?12/11/2021, 2:56 PM ?

## 2021-12-11 NOTE — BH IP Treatment Plan (Signed)
Interdisciplinary Treatment and Diagnostic Plan Update ? ?12/11/2021 ?Time of Session: 10:00am ?Duane Salazar ?MRN: [id number] ? ?Principal Diagnosis: Major depressive disorder, recurrent severe without psychotic features ([place]) ? ?Secondary Diagnoses: Principal Problem: ?  Major depressive disorder, recurrent severe without psychotic features ([place]) ?Active Problems: ?  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 ? ?Current Medications:  ?Current Facility-Administered Medications  ?Medication Dose Route Frequency Provider Last Rate Last Admin  ? acetaminophen (TYLENOL) tablet 650 mg  650 mg Oral Q6H PRN Charmaine Downs C, NP   650 mg at 12/11/21 1116  ? alum & mag hydroxide-simeth (MAALOX/MYLANTA) 200-200-20 MG/5ML suspension 30 mL  30 mL Oral Q4H PRN Charmaine Downs C, NP      ? amLODipine (NORVASC) tablet 5 mg  5 mg Oral Daily Charmaine Downs C, NP   5 mg at 12/11/21 0817  ? benztropine (COGENTIN) tablet 0.5 mg  0.5 mg Oral BID Charmaine Downs C, NP   0.5 mg at 12/11/21 0815  ? celecoxib (CELEBREX) capsule 200 mg  200 mg Oral Daily Onuoha, Josephine C, NP   200 mg at 12/11/21 0817  ? chlorproMAZINE (THORAZINE) injection 50 mg  50 mg Intramuscular TID PRN Massengill, Ovid Curd, MD      ? chlorproMAZINE (THORAZINE) tablet 25 mg  25 mg Oral Q12H Massengill, Ovid Curd, MD   25 mg at 12/11/21 0815  ? chlorproMAZINE (THORAZINE) tablet 50 mg  50 mg Oral TID PRN Janine Limbo, MD   50 mg at 12/10/21 2141  ? divalproex (DEPAKOTE) DR tablet 750 mg  750 mg Oral BID Charmaine Downs C, NP   750 mg at 12/11/21 0816  ? fluticasone (FLONASE) 50 MCG/ACT nasal spray 1 spray  1 spray Each Nare PRN Charmaine Downs C, NP      ? gabapentin (NEURONTIN) capsule 400 mg  400 mg Oral TID Charmaine Downs C, NP   400 mg at 12/11/21 0816  ? hydrOXYzine (ATARAX) tablet 25 mg  25 mg Oral TID PRN Charmaine Downs C, NP   25 mg at 12/11/21 1118  ? insulin aspart (novoLOG) injection 0-5 Units  0-5 Units Subcutaneous QHS Onuoha, Josephine C, NP       ? losartan (COZAAR) tablet 50 mg  50 mg Oral Daily Onuoha, Josephine C, NP   50 mg at 12/11/21 0815  ? magnesium hydroxide (MILK OF MAGNESIA) suspension 30 mL  30 mL Oral Daily PRN Charmaine Downs C, NP      ? menthol-cetylpyridinium (CEPACOL) lozenge 3 mg  1 lozenge Oral PRN Massengill, Ovid Curd, MD   3 mg at 12/11/21 1110  ? metFORMIN (GLUCOPHAGE) tablet 500 mg  500 mg Oral Q breakfast Charmaine Downs C, NP   500 mg at 12/11/21 0816  ? risperiDONE (RISPERDAL) tablet 1 mg  1 mg Oral QHS Onuoha, Josephine C, NP   1 mg at 12/10/21 2119  ? traZODone (DESYREL) tablet 100 mg  100 mg Oral QHS PRN Charmaine Downs C, NP   100 mg at 12/10/21 2141  ? ?PTA Medications: ?Medications Prior to Admission  ?Medication Sig Dispense Refill Last Dose  ? amLODipine (NORVASC) 5 MG tablet Take 1 tablet (5 mg total) by mouth daily. (Patient not taking: Reported on 12/09/2021) 30 tablet 0   ? benztropine (COGENTIN) 0.5 MG tablet Take 1 tablet (0.5 mg total) by mouth 2 (two) times daily. (Patient not taking: Reported on 12/09/2021) 60 tablet 0   ? celecoxib (CELEBREX) 200 MG capsule Take 1 capsule (200 mg total) by mouth daily. (Patient not taking:  Reported on 12/09/2021) 30 capsule 0   ? chlorproMAZINE (THORAZINE) 25 MG tablet Take 1.5 tablets (37.5 mg total) by mouth 2 (two) times daily. (Patient not taking: Reported on 12/09/2021) 60 tablet 0   ? chlorproMAZINE (THORAZINE) 50 MG tablet Take 1 tablet (50 mg total) by mouth at bedtime. (Patient not taking: Reported on 12/09/2021) 30 tablet 0   ? divalproex (DEPAKOTE) 250 MG DR tablet Take 3 tablets (750 mg total) by mouth 2 (two) times daily. (Patient not taking: Reported on 12/09/2021) 180 tablet 0   ? divalproex (DEPAKOTE) 250 MG DR tablet Take 3 tablets (750 mg total) by mouth 2 (two) times daily. (Patient not taking: Reported on 12/09/2021) 30 tablet 0   ? ferrous sulfate 325 (65 FE) MG tablet Take 1 tablet by mouth every Monday and Friday (Patient not taking: Reported on 12/09/2021) 100  tablet 1   ? fluticasone (FLONASE) 50 MCG/ACT nasal spray Place 1 spray into both nostrils daily as needed for allergies or rhinitis. (Patient not taking: Reported on 12/09/2021) 16 g 0   ? gabapentin (NEURONTIN) 400 MG capsule Take 1 capsule (400 mg total) by mouth 3 (three) times daily. (Patient not taking: Reported on 12/09/2021) 90 capsule 0   ? hydrOXYzine (ATARAX) 25 MG tablet Take 1 tablet (25 mg total) by mouth 3 (three) times daily as needed for anxiety. (Patient not taking: Reported on 12/09/2021) 30 tablet 0   ? losartan (COZAAR) 50 MG tablet Take 1 tablet (50 mg total) by mouth daily. (Patient not taking: Reported on 12/09/2021) 30 tablet 0   ? metFORMIN (GLUCOPHAGE) 500 MG tablet Take 1 tablet (500 mg total) by mouth daily with breakfast. (Patient not taking: Reported on 12/09/2021) 30 tablet 0   ? predniSONE (DELTASONE) 5 MG tablet take 1 tablet by mouth with breaskfast (Patient not taking: Reported on 12/09/2021) 30 tablet 0   ? risperiDONE (RISPERDAL) 0.5 MG tablet Take 1 tablet (0.5 mg total) by mouth daily. (Patient not taking: Reported on 12/09/2021) 30 tablet 0   ? risperiDONE (RISPERDAL) 1 MG tablet Take 1 tablet (1 mg total) by mouth at bedtime. (Patient not taking: Reported on 12/09/2021) 30 tablet 0   ? traZODone (DESYREL) 100 MG tablet Take 1 tablet (100 mg total) by mouth at bedtime as needed for sleep. (Patient not taking: Reported on 12/09/2021) 30 tablet 0   ? ? ?Patient Stressors: Financial difficulties   ?Medication change or noncompliance   ?Substance abuse   ? ?Patient Strengths: Capable of independent living  ?Communication skills  ?Religious Affiliation  ?Supportive family/friends  ? ?Treatment Modalities: Medication Management, Group therapy, Case management,  ?1 to 1 session with clinician, Psychoeducation, Recreational therapy. ? ? ?Physician Treatment Plan for Primary Diagnosis: Major depressive disorder, recurrent severe without psychotic features ([place]) ?Long Term Goal(s):    ? ?Short  Term Goals:   ? ?Medication Management: Evaluate patient's response, side effects, and tolerance of medication regimen. ? ?Therapeutic Interventions: 1 to 1 sessions, Unit Group sessions and Medication administration. ? ?Evaluation of Outcomes: Not Met ? ?Physician Treatment Plan for Secondary Diagnosis: Principal Problem: ?  Major depressive disorder, recurrent severe without psychotic features ([place]) ?Active Problems: ?  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 ?Long Term Goal(s):    ? ?Short Term Goals:      ? ?Medication Management: Evaluate patient's response, side effects, and tolerance of medication regimen. ? ?Therapeutic Interventions: 1 to 1 sessions, Unit Group sessions and Medication administration. ? ?Evaluation of Outcomes: Not Met ? ? ?  RN Treatment Plan for Primary Diagnosis: Major depressive disorder, recurrent severe without psychotic features ([place]) ?Long Term Goal(s): Knowledge of disease and therapeutic regimen to maintain health will improve ? ?Short Term Goals: Ability to remain free from injury will improve, Ability to verbalize frustration and anger appropriately will improve, Ability to demonstrate self-control, Ability to participate in decision making will improve, Ability to verbalize feelings will improve, Ability to disclose and discuss suicidal ideas, Ability to identify and develop effective coping behaviors will improve, and Compliance with prescribed medications will improve ? ?Medication Management: RN will administer medications as ordered by provider, will assess and evaluate patient's response and provide education to patient for prescribed medication. RN will report any adverse and/or side effects to prescribing provider. ? ?Therapeutic Interventions: 1 on 1 counseling sessions, Psychoeducation, Medication administration, Evaluate responses to treatment, Monitor vital signs and CBGs as ordered, Perform/monitor CIWA, COWS, AIMS and Fall Risk screenings as ordered, Perform wound care  treatments as ordered. ? ?Evaluation of Outcomes: Not Met ? ? ?LCSW Treatment Plan for Primary Diagnosis: Major depressive disorder, recurrent severe without psychotic features ([place]) ?Long Term Goal(s): Sa

## 2021-12-11 NOTE — H&P (Addendum)
Psychiatric Admission Assessment Adult ? ?Patient Identification: Duane Salazar ?MRN:  [id number] ?Date of Evaluation:  12/11/2021 ?Chief Complaint:  Major depressive disorder, recurrent severe without psychotic features ([place]) [F33.2] ?Principal Diagnosis: Schizoaffective disorder, depressive type ([place]) ?Diagnosis:  Principal Problem: ?  Schizoaffective disorder, depressive type ([place]) ?Active Problems: ?  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  Essential hypertension ?  Morbid obesity with BMI of 40.0-44.9, adult ([place]) ?  Cocaine use disorder, severe, dependence ([place]) ?  Cannabis use disorder, mild, abuse ? ?History of Present Illness: Patient is a 46 year old male who was transferred from [facility] long ED for worsening of depression, along with suicidal ideation and homicidal ideation with multiple plans.  Patient has a past psychiatric history significant for schizoaffective disorder, with 2 previous suicide attempt, 1 while OD on meth and the second trying to jump off a bridge.  Patient has had multiple psychiatric hospitalizations over the last few years. ? ?Patient states that he feels he needs improvement in mental health, reports when he gets frustrated he hears voices, becomes suicidal and homicidal.  Patient adds that he has been diagnosed with acromegaly, had a recent work-up at [facility].  Patient feels that on discharge from his last admission from behavioral health hospital which was on 11/11/2021, he was sent to [facility], to a rehab facility where he would have to work.  Patient asked that because of his acromegaly, his hip issues, he is unable to work, got frustrated and left that facility. ?  ?Patient feels that his medications do not seem to help with his impulse control, his hallucinations which only happen in the context of frustration.  Patient states that he wants something to help him think before he makes wrong choices, needs help with his addiction and his struggle with his mental health.  Patient  currently denies any hallucinations this morning, does report being homeless and does not feel he needs help with his addiction ?  ?Patient states that he can contract for safety on the unit, but cannot keep himself safe outside as he feels overwhelmed, gets easily agitated, and starts hallucinating.  Patient also reports he has assess to guns. Medications discussed with patient and he is agreeable to changes to improve his mood. He has been educated on all medications, rationales, benefits and possible side effects and verbalizes understanding. ? ?Labs Reviewed:EKG with QTC-438, Depakote level ordered for Sunday (4/16).  ?  ?Associated Signs/Symptoms: ?Depression Symptoms:  anhedonia, ?insomnia, ?fatigue, ?feelings of worthlessness/guilt, ?difficulty concentrating, ?hopelessness, ?suicidal thoughts with specific plan, ?loss of energy/fatigue, ?disturbed sleep, ?Duration of Depression Symptoms: Greater than two weeks ? ?(Hypo) Manic Symptoms:  Distractibility, ?Impulsivity, ?Irritable Mood, ?Anxiety Symptoms:  Excessive Worry, ?Psychotic Symptoms:  Hallucinations: Auditory ?PTSD Symptoms: ?NA ?Total Time spent with patient: 45 minutes ? ?Past Psychiatric History: Schizoaffective d/o ? ?Is the patient at risk to self? Yes.    ?Has the patient been a risk to self in the past 6 months? Yes.    ?Has the patient been a risk to self within the distant past? Yes.    ?Is the patient a risk to others? Yes.    ?Has the patient been a risk to others in the past 6 months? Yes.    ?Has the patient been a risk to others within the distant past? Yes.    ? ?Prior Inpatient Therapy:   ?Prior Outpatient Therapy:   ? ?Alcohol Screening: 1. How often do you have a drink containing alcohol?: 4 or more times a week ?2.  How many drinks containing alcohol do you have on a typical day when you are drinking?: 5 or 6 ?3. How often do you have six or more drinks on one occasion?: Weekly ?AUDIT-C Score: 9 ?4. How often during the last year have  you found that you were not able to stop drinking once you had started?: Never ?5. How often during the last year have you failed to do what was normally expected from you because of drinking?: Never ?6. How often during the last year have you needed a first drink in the morning to get yourself going after a heavy drinking session?: Never ?7. How often during the last year have you had a feeling of guilt of remorse after drinking?: Never ?8. How often during the last year have you been unable to remember what happened the night before because you had been drinking?: Never ?9. Have you or someone else been injured as a result of your drinking?: No ?10. Has a relative or friend or a doctor or another health worker been concerned about your drinking or suggested you cut down?: No ?Alcohol Use Disorder Identification Test Final Score (AUDIT): 9 ?Alcohol Brief Interventions/Follow-up: Patient Refused ?Substance Abuse History in the last 12 months:  Yes.   ?Consequences of Substance Abuse: ?Legal Consequences:  History of incarcerations ?Previous Psychotropic Medications: Yes  ?Psychological Evaluations: No  ?Past Medical History:  ?Past Medical History:  ?Diagnosis Date  ? 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[place]) 2004  ? AKI (acute kidney injury) ([place]) 03/21/2015  ? Arthritis Dx 2002  ? Diabetes type 2, controlled ([place]) 2010  ? Drug abuse ([place])   ? Headache   ? Hypertension Dx 2002  ? 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 Schizo affective schizophrenia ([place]) 1995  ? Schizophrenia ([place])   ? Seizures ([place])   ? Sleep apnea 1995  ? on CPAP  ?  ?Past Surgical History:  ?Procedure Laterality Date  ? PITUITARY SURGERY  2005 & 2012  ? SKIN BIOPSY    ? ?Family History:  ?Family History  ?Problem Relation Age of Onset  ? Hypertension Mother   ? Diabetes Mother   ? Heart Problems Mother   ? Cancer Maternal Uncle   ? Alcoholism Maternal Uncle   ? Cancer Maternal Grandmother   ? Heart disease Neg Hx   ? ?Family Psychiatric  History: None reported ?Tobacco  Screening:   ?Social History:  ?Social History  ? ?Substance and Sexual Activity  ?Alcohol Use Yes  ? Alcohol/week: 2.0 standard drinks  ? Types: 2 Cans of beer per week  ? Comment: 2 cans on workdays; 6-pk on days off; varies  ?   ?Social History  ? ?Substance and Sexual Activity  ?Drug Use Yes  ? Types: Cocaine, Marijuana, Heroin  ? Comment: Reports use 2 grams Cocaine 10/28/21  ?  ?Additional Social History: ? Allergies:  No Known Allergies ?Lab Results:  ?Results for orders placed or performed during the hospital encounter of 12/10/21 (from the past 48 hour(s))  ?Glucose, capillary     Status: Abnormal  ? Collection Time: 12/10/21  8:32 PM  ?Result Value Ref Range  ? Glucose-Capillary 194 (H) 70 - 99 mg/dL  ?  Comment: Glucose reference range applies only to samples taken after fasting for at least 8 hours.  ? Comment 1 Notify RN   ? Comment 2 Document in Chart   ?Glucose, capillary     Status: Abnormal  ? Collection Time: 12/11/21  6:10 AM  ?Result Value Ref  Range  ? Glucose-Capillary 129 (H) 70 - 99 mg/dL  ?  Comment: Glucose reference range applies only to samples taken after fasting for at least 8 hours.  ? Comment 1 Notify RN   ? Comment 2 Document in Chart   ? ? ?Blood Alcohol level:  ?Lab Results  ?Component Value Date  ? ETH <10 12/08/2021  ? ETH <10 11/26/2021  ? ? ?Metabolic Disorder Labs:  ?Lab Results  ?Component Value Date  ? HGBA1C 6.5 (H) 10/31/2021  ? MPG 139.85 10/31/2021  ? ?No results found for: PROLACTIN ?Lab Results  ?Component Value Date  ? CHOL 128 10/31/2021  ? TRIG 90 10/31/2021  ? HDL 42 10/31/2021  ? CHOLHDL 3.0 10/31/2021  ? VLDL 18 10/31/2021  ? [place] 68 10/31/2021  ? [place] 81 09/04/2021  ? ? ?Current Medications: ?Current Facility-Administered Medications  ?Medication Dose Route Frequency Provider Last Rate Last Admin  ? acetaminophen (TYLENOL) tablet 650 mg  650 mg Oral Q6H PRN Charmaine Downs C, NP   650 mg at 12/11/21 1116  ? alum & mag hydroxide-simeth (MAALOX/MYLANTA)  200-200-20 MG/5ML suspension 30 mL  30 mL Oral Q4H PRN Charmaine Downs C, NP      ? amLODipine (NORVASC) tablet 5 mg  5 mg Oral Daily Charmaine Downs C, NP   5 mg at 12/11/21 0817  ? benztropine (COGENTIN)

## 2021-12-11 NOTE — Progress Notes (Signed)
Inpatient Diabetes Program Recommendations ? ?AACE/ADA: New Consensus Statement on Inpatient Glycemic Control (2015) ? ?Target Ranges:  Prepandial:   less than 140 mg/dL ?     Peak postprandial:   less than 180 mg/dL (1-2 hours) ?     Critically ill patients:  140 - 180 mg/dL  ? ?Lab Results  ?Component Value Date  ? GLUCAP 129 (H) 12/11/2021  ? HGBA1C 6.5 (H) 10/31/2021  ? ? Latest Reference Range & Units 12/09/21 17:07 12/10/21 07:12 12/10/21 12:00 12/10/21 20:32 12/11/21 06:10  ?Glucose-Capillary 70 - 99 mg/dL 155 (H) 171 (H) 176 (H) 194 (H) 129 (H)  ?(H): Data is abnormally high ?Review of Glycemic Control ? ?Diabetes history: type 2 ?Outpatient Diabetes medications: Metformin 500 mg BID ?Current orders for Inpatient glycemic control: Novolog 0-5 units at HS, Metformin 500 mg daily ? ?Inpatient Diabetes Program Recommendations:   ?Received diabetes coordinator consult. Agree with current orders for meds. May want to increase Metformin to 500 mg BID and discontinue Novolog correction scale at HS. ? ?Harvel Ricks RN BSN CDE ?Diabetes Coordinator ?Pager: [phone]  8am-5pm  ? ? ? ?
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
## 2021-12-11 NOTE — Progress Notes (Signed)
Patient was well behaved this AM, but became irritable as day went by because he was given lozenges at 1400, he came back to request for another at 1430. Staff explained to him that the lozenges are medication and can not be taken indiscriminately. Patient got angry and left for the day room. Patient did not denies SI, he denies HI and AVH. Patient is compliant with routine medication, tolerated  them well with not adverse effect noted.  No acute distress noted at this time. Staff will continue to monitor. ? 12/11/21 0827  ?Psych Admission Type (Psych Patients Only)  ?Admission Status Voluntary  ?Psychosocial Assessment  ?Patient Complaints Self-harm thoughts  ?Eye Contact Brief  ?Facial Expression Angry  ?Affect Irritable;Labile  ?Speech Pressured  ?Interaction Arrogant;Demanding  ?Motor Activity Slow  ?Appearance/Hygiene Improved  ?Behavior Characteristics Irritable  ?Mood Angry;Irritable  ?Thought Process  ?Coherency Disorganized  ?Content Other (Comment) ?(Preoccupied with self)  ?Delusions None reported or observed  ?Perception WDL  ?Hallucination None reported or observed  ?Judgment Poor  ?Confusion None  ?Danger to Self  ?Current suicidal ideation? Passive  ?Self-Injurious Behavior Some self-injurious ideation observed or expressed.  No lethal plan expressed   ?Agreement Not to Harm Self Yes  ?Description of Agreement verbal  ?Danger to Others  ?Danger to Others None reported or observed  ?Danger to Others Abnormal  ?Harmful Behavior to others No threats or harm toward other people  ?Destructive Behavior No threats or harm toward property  ? ? ?

## 2021-12-11 NOTE — Group Note (Signed)
Recreation Therapy Group Note ? ? ?Group Topic:Team Building  ?Group Date: 12/11/2021 ?Start Time: 0930 ?End Time: 7408 ?Facilitators: Victorino Sparrow, LRT, CTRS ?Location: [facility] ? ? ?Goal Area(s) Addresses:  ?Patient will effectively work with peer towards shared goal.  ?Patient will identify skills used to make activity successful.  ?Patient will identify how skills used during activity can be applied to reach post d/c goals.  ? ?Group Description: Tallest [employer]. In teams of 5-6, patients were given 25 small craft pipe cleaners. Using the materials provided, patients were instructed to compete again the opposing team(s) to build the tallest free-standing structure from floor level. The activity was timed; difficulty increased by Probation officer as Pharmacist, hospital continued.  Systematically resources were removed with additional directions for example, placing one arm behind their back, working in silence, and shape stipulations. LRT facilitated post-activity discussion reviewing team processes and necessary communication skills involved in completion. Patients were encouraged to reflect how the skills utilized, or not utilized, in this activity can be incorporated to positively impact support systems post discharge. ? ? ?Affect/Mood: Appropriate ?  ?Participation Level: Minimal ?  ?Participation Quality: Independent ?  ?Behavior: Appropriate ?  ?Speech/Thought Process: Focused ?  ?Insight: Good ?  ?Judgement: Good ?  ?Modes of Intervention: Competitive Play ?  ?Patient Response to Interventions:  Attentive ?  ?Education Outcome: ? Acknowledges education and In group clarification offered   ? ?Clinical Observations/Individualized Feedback: Pt was appropriate.  Pt observed for most of group session.  Pt eventually joined to help the group complete the activity.  ? ? ?Plan: Continue to engage patient in RT group sessions 2-3x/week. ? ? ?Victorino Sparrow, LRT,CTRS ?12/11/2021 12:05 PM ?

## 2021-12-11 NOTE — Progress Notes (Signed)
Pt attended the evening AA group and participated appropriately. ?

## 2021-12-11 NOTE — BHH Suicide Risk Assessment (Addendum)
[facility] Admission Suicide Risk Assessment ? ? ?Nursing information obtained from:  Patient ?Demographic factors:  Male, Living alone, Unemployed, Low socioeconomic status ?Current Mental Status:  Suicidal ideation indicated by patient, Self-harm thoughts ?Loss Factors:  Financial problems / change in socioeconomic status, Decrease in vocational status ?Historical Factors:  Impulsivity ?Risk Reduction Factors:  Positive social support ("My mom") ? ?Total Time spent with patient: 45 minutes ?Principal Problem: Schizoaffective disorder, depressive type ([place]) ?Diagnosis:  Principal Problem: ?  Schizoaffective disorder, depressive type ([place]) ?Active Problems: ?  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  Essential hypertension ?  Morbid obesity with BMI of 40.0-44.9, adult ([place]) ?  Cocaine use disorder, severe, dependence ([place]) ?  Cannabis use disorder, mild, abuse ? ?Subjective Data: Patient is a 46 year old male who was transferred from [facility] long ED for worsening of depression, along with suicidal ideation and homicidal ideation with multiple plans.  Patient has a past psychiatric history significant for schizoaffective disorder, with 2 previous suicide attempt, 1 while OD on meth and the second trying to jump off a bridge.  Patient has had multiple psychiatric hospitalizations over the last few years. ? ?Patient states that he feels he needs improvement in mental health, reports when he gets frustrated he hears voices, becomes suicidal and homicidal.  Patient adds that he has been diagnosed with acromegaly, had a recent work-up at [facility].  Patient feels that on discharge from his last admission from behavioral health hospital which was on 11/11/2021, he was sent to [facility], to a rehab facility where he would have to work.  Patient asked that because of his acromegaly, his hip issues, he is unable to work, got frustrated and left that facility. ? ?Patient feels that his medications do not seem to help with his impulse  control, his hallucinations which only happen in the context of frustration.  Patient states that he wants something to help him think before he makes wrong choices, needs help with his addiction and his struggle with his mental health.  Patient currently denies any hallucinations this morning, does report being homeless and does not feel he needs help with his addiction ? ?Patient states that he can contract for safety on the unit, but cannot keep himself safe outside as he feels overwhelmed, gets easily agitated, and starts hallucinating.  Patient also reports he has assess to guns ? ?Continued Clinical Symptoms:  ?Alcohol Use Disorder Identification Test Final Score (AUDIT): 9 ?The "Alcohol Use Disorders Identification Test", Guidelines for Use in Primary Care, Second Edition.  World Pharmacologist [facility]). ?Score between 0-7:  no or low risk or alcohol related problems. ?Score between 8-15:  moderate risk of alcohol related problems. ?Score between 16-19:  high risk of alcohol related problems. ?Score 20 or above:  warrants further diagnostic evaluation for alcohol dependence and treatment. ? ? ?CLINICAL FACTORS:  ? Severe Anxiety and/or Agitation ?Alcohol/Substance Abuse/Dependencies ?Schizophrenia:   Paranoid or undifferentiated type ?More than one psychiatric diagnosis ?Unstable or Poor Therapeutic Relationship ?Previous Psychiatric Diagnoses and Treatments ?Medical Diagnoses and Treatments/Surgeries ? ? ?Musculoskeletal: ?Strength & Muscle Tone: within normal limits ?Gait & Station: broad based ?Patient leans: N/A ? ?Psychiatric Specialty Exam: ? ?Presentation  ?General Appearance: Casual; Appropriate for Environment ? ?Eye Contact:Fair ? ?Speech:Clear and Coherent ? ?Speech Volume:Increased ? ?Handedness:Right ? ? ?Mood and Affect  ?Mood:Dysphoric; Irritable ? ?Affect:Labile ? ? ?Thought Process  ?Thought Processes:Linear ? ?Descriptions of Associations:Intact ? ?Orientation:Full (Time, Place and  Person) ? ?Thought Content:Illogical; Logical; Rumination ? ?History of  Schizophrenia/Schizoaffective disorder:Yes ? ?Duration of Psychotic Symptoms:Greater than six months ? ?Hallucinations:Hallucinations: Auditory; Command (when he gets upset) ?Description of Command Hallucinations: telling him to hurt himself and others when upset, feeling agitated ?Description of Auditory Hallucinations: Voices telling her to attack others when he feels frustrated agitated ?Description of Visual Hallucinations: None reported ? ?Ideas of Reference:None ? ?Suicidal Thoughts:Suicidal Thoughts: Yes, Passive ?SI Active Intent and/or Plan: Without Intent; Without Plan ?SI Passive Intent and/or Plan: With Access to Means ? ?Homicidal Thoughts:Homicidal Thoughts: Yes, Passive (When upset) ?HI Active Intent and/or Plan: -- (Contracts for safety on the unit) ?HI Passive Intent and/or Plan: With Access to Means ? ? ?Sensorium  ?Memory:Immediate Fair; Remote Fair; Recent Fair ? ?Judgment:Impaired ? ?Insight:Fair ? ? ?Executive Functions  ?Concentration:Fair ? ?Attention Span:Fair ? ?Recall:Fair ? ?[place] ? ?Language:Fair ? ? ?Psychomotor Activity  ?Psychomotor Activity:Psychomotor Activity: Restlessness ? ? ?Assets  ?Assets:Desire for Improvement; Leisure Time; Financial Resources/Insurance ? ? ?Sleep  ?Sleep:Sleep: Fair ? ? ? ?Physical Exam: ?Physical Exam ?Review of Systems  ?Constitutional: Negative.  Negative for fever.  ?HENT:  Negative for congestion, hearing loss and sore throat.   ?Eyes: Negative.  Negative for blurred vision, double vision and discharge.  ?Respiratory:  Negative for cough, shortness of breath and wheezing.   ?Cardiovascular: Negative.  Negative for chest pain and palpitations.  ?Gastrointestinal:  Negative for abdominal pain, constipation, diarrhea, heartburn, nausea and vomiting.  ?Musculoskeletal:  Positive for joint pain and myalgias.  ?Neurological:  Negative for dizziness, seizures, weakness  and headaches.  ?Endo/Heme/Allergies: Negative.  Negative for environmental allergies.  ?Psychiatric/Behavioral:  Positive for depression, hallucinations, substance abuse and suicidal ideas. Negative for memory loss. The patient is not nervous/anxious and does not have insomnia.   ?Blood pressure (!) 124/95, pulse 97, temperature 97.7 ?F (36.5 ?C), temperature source Oral, resp. rate 16, height '6\' 3"'$  (1.905 m), weight 121.6 kg, SpO2 100 %. Body mass index is 33.5 kg/m?. ? ? ?COGNITIVE FEATURES THAT CONTRIBUTE TO RISK:  ?Closed-mindedness and Thought constriction (tunnel vision)   ? ?SUICIDE RISK:  ? Moderate:  Frequent suicidal ideation with limited intensity, and duration, some specificity in terms of plans, no associated intent, good self-control, limited dysphoria/symptomatology, some risk factors present, and identifiable protective factors, including available and accessible social support. ? ?PLAN OF CARE: While here patient will undergo cognitive behavioral therapy, substance abuse education and treatment along with communication skills training.  Also patient has a history of noncompliance with medication, needs to be able to identify his triggers and safely and effectively participate in outpatient treatment on discharge  ? ?I certify that inpatient services furnished can reasonably be expected to improve the patient's condition.  ? ?Hampton Abbot, MD ?12/11/2021, 12:30 PM ? ?

## 2021-12-11 NOTE — BHH Counselor (Signed)
CSW contacted [facility] at [phone] to determine if the Pt's medical insurance will pay for an inpatient mental health treatment center and which centers the insurance will cover in full.  CSW requested a list of inpatient facilities within 100 miles of the [place] area.  CSW received an email containing a link to this list.  CSW reviewed all information on the [facility] website and discovered that [facility] does not provide any coverage for inpatient mental health or substance use treatment, outside of a hospital emergency.  The Pt will not be able to use his insurance for this treatment and there are no other treatment options that do not require the Pt to work. CSW contacted [facility] as requested by the Pt and was informed that it would cost $3500 for him to attend.  CSW will discuss this with the Pt and will offer shelter resources and outpatient treatment options.  ?

## 2021-12-11 NOTE — Group Note (Signed)
Date:  12/11/2021 ?Time:  11:33 AM ? ?Group Topic/Focus:  ?Orientation:   The focus of this group is to educate the patient on the purpose and policies of crisis stabilization and provide a format to answer questions about their admission.  The group details unit policies and expectations of patients while admitted. ? ? ? ?Participation Level:  Minimal ? ?Participation Quality:  Drowsy ? ?Affect:  Flat ? ?Cognitive:  Lacking ? ?Insight: Lacking ? ?Engagement in Group:  Poor ? ?Modes of Intervention:  Discussion ? ?Additional Comments:  Patient slept through group. ? ?Jerrye Beavers ?12/11/2021, 11:33 AM ? ?

## 2021-12-11 NOTE — Progress Notes (Signed)
Pt reported SI with a plan to use gun when he gets out of the hospital. Pt asked the writer if she discussed his situation with the doctor, the writer said no, the pt then asked if the writer could call the doctor be cause he felt like he was going to explode, when the writer inquired further, the pt became irritable and walked away from the window. The writer informed the charge nurse who came and talked with pt. Pt was medicated with trazodone, thorazine, and vistaril. Will continue to monitor. ?

## 2021-12-11 NOTE — Group Note (Signed)
LCSW Group Therapy Note ? ? ?Group Date: 12/11/2021 ?Start Time: 1300 ?End Time: 1400 ? ?Type of Therapy and Topic:  Group Therapy - Healthy vs Unhealthy Coping Skills ? ?Participation Level:  Active  ? ?Description of Group ?The focus of this group was to determine what unhealthy coping techniques typically are used by group members and what healthy coping techniques would be helpful in coping with various problems. Patients were guided in becoming aware of the differences between healthy and unhealthy coping techniques. Patients were asked to identify 2-3 healthy coping skills they would like to learn to use more effectively. ? ?Therapeutic Goals ?Patients learned that coping is what human beings do all day long to deal with various situations in their lives ?Patients defined and discussed healthy vs unhealthy coping techniques ?Patients identified their preferred coping techniques and identified whether these were healthy or unhealthy ?Patients determined 2-3 healthy coping skills they would like to become more familiar with and use more often. ?Patients provided support and ideas to each other ? ? ?Summary of Patient Progress:  During group, [place] expressed that prayer is a helpful coping skill. Patient proved open to input from peers and feedback from [place]. Patient demonstrated insight into the subject matter, was respectful of peers, and participated throughout the entire session. ? ?Darleen Crocker, LCSWA ?12/11/2021  1:37 PM   ? ?

## 2021-12-12 LAB — GLUCOSE, CAPILLARY
Glucose-Capillary: 183 mg/dL — ABNORMAL HIGH (ref 70–99)
Glucose-Capillary: 178 mg/dL — ABNORMAL HIGH (ref 70–99)
Glucose-Capillary: 167 mg/dL — ABNORMAL HIGH (ref 70–99)

## 2021-12-12 MED ORDER — CHLORPROMAZINE HCL 50 MG PO TABS
50.0000 mg | ORAL_TABLET | Freq: Two times a day (BID) | ORAL | Status: DC
  Administered 2021-12-13: 50 mg via ORAL
  Filled 2021-12-12 (×4): qty 1

## 2021-12-12 MED ORDER — AMLODIPINE BESYLATE 5 MG PO TABS
5.0000 mg | ORAL_TABLET | Freq: Every day | ORAL | Status: DC
  Filled 2021-12-12: qty 1

## 2021-12-12 MED ORDER — AMLODIPINE BESYLATE 10 MG PO TABS
10.0000 mg | ORAL_TABLET | Freq: Every day | ORAL | Status: DC
  Administered 2021-12-13 – 2021-12-14 (×2): 10 mg via ORAL
  Filled 2021-12-12 (×5): qty 1

## 2021-12-12 MED ORDER — CHLORPROMAZINE HCL 100 MG PO TABS
100.0000 mg | ORAL_TABLET | Freq: Every day | ORAL | Status: DC
  Administered 2021-12-12: 100 mg via ORAL
  Filled 2021-12-12 (×2): qty 1

## 2021-12-12 MED ORDER — METFORMIN HCL 500 MG PO TABS
500.0000 mg | ORAL_TABLET | Freq: Two times a day (BID) | ORAL | Status: DC
  Administered 2021-12-12 – 2021-12-14 (×4): 500 mg via ORAL
  Filled 2021-12-12 (×6): qty 1

## 2021-12-12 MED ORDER — AMLODIPINE BESYLATE 5 MG PO TABS
5.0000 mg | ORAL_TABLET | Freq: Once | ORAL | Status: AC
  Administered 2021-12-12: 5 mg via ORAL
  Filled 2021-12-12: qty 1

## 2021-12-12 NOTE — Progress Notes (Signed)
D. Pt presents with a flat affect, depressed mood- calm, cooperative behavior. Per pt's self inventory, pt rated his depression, hopelessness and anxiety a 10/8/8, respectively. Pt observed in the milieu attending groups today.  Pt currently denies SI/HI and AVH  ?A. Labs and vitals monitored. Pt given and educated on medications. Pt supported emotionally and encouraged to express concerns and ask questions.   ?R. Pt remains safe with 15 minute checks. Will continue POC. ? ?  ?

## 2021-12-12 NOTE — BHH Group Notes (Signed)
.  Psychoeducational Group Note ? ? ? ?Date:  4/15//23 ?Time: 1100-1200 ? ? ? ?Purpose of Group: . The group focus' on teaching patients on how to identify their needs and their Life Skills:  A group where two lists are made. What people need and what are things that we do that are unhealthy. The lists are developed by the patients and it is explained that we often do the actions that are not healthy to get our list of needs met. ? ?Goal:: to develop the coping skills needed to get their needs met ? ?Participation Level:  Active ? ?Participation Quality:  Appropriate ? ?Affect:  Appropriate ? ?Cognitive:  Oriented ? ?Insight:  Improving ? ?Engagement in Group:  Engaged ? ?Additional Comments: Pt attended the group and was active at the beginning. Then fell asleep. ? ?Bryson Dames A ? ?

## 2021-12-12 NOTE — Progress Notes (Signed)
Adult Psychoeducational Group Note ? ?Date:  12/12/2021 ?Time:  9:06 PM ? ?Group Topic/Focus:  ?Wrap-Up Group:   The focus of this group is to help patients review their daily goal of treatment and discuss progress on daily workbooks. ? ?Participation Level:  Active ? ?Participation Quality:  Appropriate ? ?Affect:  Appropriate ? ?Cognitive:  Appropriate ? ?Insight: Appropriate ? ?Engagement in Group:  Engaged ? ?Modes of Intervention:  Discussion ? ?Additional Comments: Pt stated his goal for today was to focus on his treatment plan. Pt stated he accomplished his goal today. Pt stated he talked with his doctor and social worker about his care today. Pt rated his overall day a 5 out of 10. Pt stated he made no calls today. Pt stated he did not feel to good about himself today. Pt stated he was able to attend all meals. Pt stated he took all medications provided today. Pt stated he attend all groups held today. Pt stated his appetite was pretty good today. Pt rated sleep last night was poor. Pt stated the goal tonight was to get some rest. Pt stated he had no physical pain tonight. Pt admitted to dealing with visual hallucinations and auditory issues tonight. Pt nurse was updated on the situation. Pt admitted to having thoughts of harming himself or others. Pt stated he could contract for safety. Pt nurse was updated on the situation. Pt stated he would alert staff if anything changed ? ?Candy Sledge ?12/12/2021, 9:06 PM ?

## 2021-12-12 NOTE — Group Note (Signed)
LCSW Group Therapy Note ? ?Group Date: 12/12/2021 ?Start Time: 1000 ?End Time: 1100 ? ? ?Type of Therapy and Topic:  Group Therapy: Anger Cues and Responses ? ?Participation Level:  Active ? ? ?Description of Group:   ?In this group, patients learned how to recognize the physical, cognitive, emotional, and behavioral responses they have to anger-provoking situations.  They identified a recent time they became angry and how they reacted.  They analyzed how their reaction was possibly beneficial and how it was possibly unhelpful.  The group discussed a variety of healthier coping skills that could help with such a situation in the future.  Focus was placed on how helpful it is to recognize the underlying emotions to our anger, because working on those can lead to a more permanent solution as well as our ability to focus on the important rather than the urgent. ? ?Therapeutic Goals: ?Patients will remember their last incident of anger and how they felt emotionally and physically, what their thoughts were at the time, and how they behaved. ?Patients will identify how their behavior at that time worked for them, as well as how it worked against them. ?Patients will explore possible new behaviors to use in future anger situations. ?Patients will learn that anger itself is normal and cannot be eliminated, and that healthier reactions can assist with resolving conflict rather than worsening situations. ? ?Summary of Patient Progress:  Duane "Tyrone" was active during the group. He shared a recent occurrence wherein feeling regretful led to anger. He demonstrated growing insight into the subject matter, was respectful of peers, and participated throughout the entire session.  He stated that he has been aggravated today with thoughts of what he has lost by not managing his life well, and that he has been holding in his feelings.   ? ?Therapeutic Modalities:   ?Cognitive Behavioral Therapy ? ? ? ?Maretta Los,  LCSWA ?12/12/2021  2:07 PM   ? ?

## 2021-12-12 NOTE — BH Assessment (Signed)
Patient has been up in the dayroom watching tv. He did attend AA meeting. He refused his insulin reporting that the doctor is supposed to start him on metformin and he does not take insulin. He is easily agitated when writer explains his medications order for thorazine. Safety maintained with 15 min checks. ?

## 2021-12-12 NOTE — BHH Group Notes (Signed)
Adult Psychoeducational Group Note ? ?Date:  12/12/2021 ?Time:  9:09 AM ? ?Group Topic/Focus:  ?Goals Group:   The focus of this group is to help patients establish daily goals to achieve during treatment and discuss how the patient can incorporate goal setting into their daily lives to aide in recovery. ?Orientation:   The focus of this group is to educate the patient on the purpose and policies of crisis stabilization and provide a format to answer questions about their admission.  The group details unit policies and expectations of patients while admitted. ? ?Participation Level:  Active ? ?Participation Quality:  Attentive ? ?Affect:  Appropriate ? ?Cognitive:  Alert ? ?Insight: Appropriate ? ?Engagement in Group:  Engaged ? ?Modes of Intervention:  Discussion ? ?Additional Comments:  Patient attended and participated in the goals group. ? ?Annie Sable ?12/12/2021, 9:09 AM ?

## 2021-12-12 NOTE — BHH Group Notes (Signed)
Psychoeducational Group Note ? ?Date: 12/12/2021 ?Time: 0900-1000 ? ? ? ?Goal Setting  ? ?Purpose of Group: This group helps to provide patients with the steps of setting a goal that is specific, measurable, attainable, realistic and time specific. A discussion on how we keep ourselves stuck with negative self talk. Homework given for Patients to write 30 positive attributes about themselves. ? ? ? ?Participation Level:  did not attend ? ?Duane Salazar A ?

## 2021-12-12 NOTE — Progress Notes (Signed)
[facility] MD Progress Note ? ?12/12/2021 1:32 PM ?Duane Salazar  ?MRN:  [id number] ? ?Subjective: Duane Salazar states: "I'm managing. I go in and out laughing, but it doesn't change the way I feel. I was suicidal this morning by any means I could, but I looked around and there is nothing here that I can use." ? ?Assessment For 12/12/2021: Pt's chart reviewed prior to assessment, and his case discussed with his treatment team. Pt with flat affect and depressed mood, attention to personal hygiene and grooming is fair, eye contact is good, speech is clear & coherent. Thought contents are organized and logical, and pt endorses +SI, and states he looked around the unit earlier in the morning for something to use, but there is nothing here that he can use to hurt himself. He denies having a current plan to hurt himself. He denies any intent to harm himself while here on the unit, and agrees to let staff know if his thoughts intensity. He is currently verbally contracting for safety on the unit. HE is also reporting +HI, but states that it is towards someone outside of the hospital, and states that he will not reveal the identity of the person. He endorses +AH of voices, but states he is unable to remember what they are saying. He does not seem to be responding to any stimuli. He denies paranoia, and denies VH. He is observed to be in the milieu interacting with peers and participating in activities. ? ?Pt states that he was only able to sleep last night after getting a second dose of a medication which he is not able to remember. As per the [facility], this was an additional dose of Thorazine. Thorazine is being increased to 100 mg nightly, and will continue 50 mg BID. Depakote level is scheduled for tomorrow morning. BP was elevated this morning at 154/91 & 144/105. Norvasc increased from 5 mg daily to 10 mg daily. Blood glucose was 183 this morning and was 212 yesterday. Will increase Metformin from 500 mg daily to 500 mg BID for management  of blood glucose. Will continue other medications as listed below. ? ?HPI:  Duane Salazar is a 46 year old male who was transferred from [place] long ED for worsening of depression, along with suicidal ideation and homicidal ideation with multiple plans.  Patient has a past psychiatric history significant for schizoaffective disorder, with 2 previous suicide attempts; 1 via OD on meth and the second trying to jump off a bridge.  Patient has had multiple psychiatric hospitalizations over the last few years. He was admitted voluntarily for treatment and stabilization of his mood. ? ?Principal Problem: Schizoaffective disorder, depressive type ([place]) ?Diagnosis: Principal Problem: ?  Schizoaffective disorder, depressive type ([place]) ?Active Problems: ?  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[place]) ?  Essential hypertension ?  Morbid obesity with BMI of 40.0-44.9, adult ([place]) ?  Cocaine use disorder, severe, dependence ([place]) ?  Cannabis use disorder, mild, abuse ? ?Total Time spent with patient: 20 minutes ? ?Past Psychiatric History: As above ? ?Past Medical History:  ?Past Medical History:  ?Diagnosis Date  ? 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[place]) 2004  ? AKI (acute kidney injury) ([place]) 03/21/2015  ? Arthritis Dx 2002  ? Diabetes type 2, controlled ([place]) 2010  ? Drug abuse ([place])   ? Headache   ? Hypertension Dx 2002  ? 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 Schizo affective schizophrenia ([place]) 1995  ? Schizophrenia ([place])   ? Seizures ([place])   ? Sleep apnea 1995  ? on CPAP  ?  ?  Past Surgical History:  ?Procedure Laterality Date  ? PITUITARY SURGERY  2005 & 2012  ? SKIN BIOPSY    ? ?Family History:  ?Family History  ?Problem Relation Age of Onset  ? Hypertension Mother   ? Diabetes Mother   ? Heart Problems Mother   ? Cancer Maternal Uncle   ? Alcoholism Maternal Uncle   ? Cancer Maternal Grandmother   ? Heart disease Neg Hx   ? ?Family Psychiatric  History: As above ?Social History:  ?Social History  ? ?Substance and Sexual Activity  ?Alcohol Use Yes  ?  Alcohol/week: 2.0 standard drinks  ? Types: 2 Cans of beer per week  ? Comment: 2 cans on workdays; 6-pk on days off; varies  ?   ?Social History  ? ?Substance and Sexual Activity  ?Drug Use Yes  ? Types: Cocaine, Marijuana, Heroin  ? Comment: Reports use 2 grams Cocaine 10/28/21  ?  ?Social History  ? ?Socioeconomic History  ? Marital status: Single  ?  Spouse name: Not on file  ? Number of children: 2  ? Years of education: GED  ? Highest education level: Not on file  ?Occupational History  ? Not on file  ?Tobacco Use  ? Smoking status: Every Day  ?  Packs/day: 1.00  ?  Years: 20.00  ?  Pack years: 20.00  ?  Types: Cigarettes  ? Smokeless tobacco: Never  ? Tobacco comments:  ?  Smoking .5 ppd  ?Substance and Sexual Activity  ? Alcohol use: Yes  ?  Alcohol/week: 2.0 standard drinks  ?  Types: 2 Cans of beer per week  ?  Comment: 2 cans on workdays; 6-pk on days off; varies  ? Drug use: Yes  ?  Types: Cocaine, Marijuana, Heroin  ?  Comment: Reports use 2 grams Cocaine 10/28/21  ? Sexual activity: Yes  ?Other Topics Concern  ? Not on file  ?Social History Narrative  ? Lives with mom.  ? Incarcerated for 22 months in [place], [place]. From 2014-09/2014  ? ?Social Determinants of Health  ? ?Financial Resource Strain: Not on file  ?Food Insecurity: Not on file  ?Transportation Needs: Not on file  ?Physical Activity: Not on file  ?Stress: Not on file  ?Social Connections: Not on file  ? ? Sleep: Fair ? ?Appetite:  Good ? ?Current Medications: ?Current Facility-Administered Medications  ?Medication Dose Route Frequency Provider Last Rate Last Admin  ? acetaminophen (TYLENOL) tablet 650 mg  650 mg Oral Q6H PRN Charmaine Downs C, NP   650 mg at 12/11/21 1116  ? alum & mag hydroxide-simeth (MAALOX/MYLANTA) 200-200-20 MG/5ML suspension 30 mL  30 mL Oral Q4H PRN Charmaine Downs C, NP      ? amLODipine (NORVASC) tablet 10 mg  10 mg Oral Daily Massengill, Nathan, MD      ? amLODipine (NORVASC) tablet 5 mg  5 mg Oral Once Massengill,  Ovid Curd, MD      ? benztropine (COGENTIN) tablet 0.5 mg  0.5 mg Oral BID Charmaine Downs C, NP   0.5 mg at 12/12/21 0749  ? celecoxib (CELEBREX) capsule 200 mg  200 mg Oral Daily Onuoha, Josephine C, NP   200 mg at 12/12/21 0749  ? chlorproMAZINE (THORAZINE) injection 50 mg  50 mg Intramuscular TID PRN Massengill, Ovid Curd, MD      ? chlorproMAZINE (THORAZINE) tablet 100 mg  100 mg Oral QHS Nicholes Rough, NP      ? [START ON 12/13/2021] chlorproMAZINE (THORAZINE) tablet 50 mg  50 mg Oral BID Nicholes Rough, NP      ? divalproex (DEPAKOTE) DR tablet 750 mg  750 mg Oral BID Charmaine Downs C, NP   750 mg at 12/12/21 0748  ? fluticasone (FLONASE) 50 MCG/ACT nasal spray 1 spray  1 spray Each Nare PRN Charmaine Downs C, NP      ? gabapentin (NEURONTIN) capsule 400 mg  400 mg Oral TID Charmaine Downs C, NP   400 mg at 12/12/21 1150  ? hydrOXYzine (ATARAX) tablet 25 mg  25 mg Oral TID PRN Charmaine Downs C, NP   25 mg at 12/11/21 1118  ? insulin aspart (novoLOG) injection 0-5 Units  0-5 Units Subcutaneous QHS Cleatrice Burke, Josephine C, NP      ? losartan (COZAAR) tablet 50 mg  50 mg Oral Daily Charmaine Downs C, NP   50 mg at 12/12/21 0749  ? magnesium hydroxide (MILK OF MAGNESIA) suspension 30 mL  30 mL Oral Daily PRN Charmaine Downs C, NP      ? menthol-cetylpyridinium (CEPACOL) lozenge 3 mg  1 lozenge Oral Q6H PRN Massengill, Ovid Curd, MD      ? metFORMIN (GLUCOPHAGE) tablet 500 mg  500 mg Oral BID WC Lander Eslick, NP      ? traZODone (DESYREL) tablet 100 mg  100 mg Oral QHS PRN Charmaine Downs C, NP   100 mg at 12/11/21 2140  ? ? ?Lab Results:  ?Results for orders placed or performed during the hospital encounter of 12/10/21 (from the past 48 hour(s))  ?Glucose, capillary     Status: Abnormal  ? Collection Time: 12/10/21  8:32 PM  ?Result Value Ref Range  ? Glucose-Capillary 194 (H) 70 - 99 mg/dL  ?  Comment: Glucose reference range applies only to samples taken after fasting for at least 8 hours.  ? Comment 1  Notify RN   ? Comment 2 Document in Chart   ?Glucose, capillary     Status: Abnormal  ? Collection Time: 12/11/21  6:10 AM  ?Result Value Ref Range  ? Glucose-Capillary 129 (H) 70 - 99 mg/dL  ?  Comment: Glucos

## 2021-12-13 DIAGNOSIS — E119 Type 2 diabetes mellitus without complications: Secondary | ICD-10-CM

## 2021-12-13 LAB — GLUCOSE, CAPILLARY
Glucose-Capillary: 158 mg/dL — ABNORMAL HIGH (ref 70–99)
Glucose-Capillary: 174 mg/dL — ABNORMAL HIGH (ref 70–99)
Glucose-Capillary: 237 mg/dL — ABNORMAL HIGH (ref 70–99)
Glucose-Capillary: 231 mg/dL — ABNORMAL HIGH (ref 70–99)

## 2021-12-13 LAB — VALPROIC ACID LEVEL: Valproic Acid Lvl: 82 ug/mL (ref 50.0–100.0)

## 2021-12-13 MED ORDER — CHLORPROMAZINE HCL 50 MG PO TABS
150.0000 mg | ORAL_TABLET | Freq: Every day | ORAL | Status: DC
  Administered 2021-12-13: 150 mg via ORAL
  Filled 2021-12-13 (×2): qty 3

## 2021-12-13 MED ORDER — SERTRALINE HCL 50 MG PO TABS
50.0000 mg | ORAL_TABLET | Freq: Every day | ORAL | Status: AC
  Administered 2021-12-13: 50 mg via ORAL
  Filled 2021-12-13 (×2): qty 1

## 2021-12-13 MED ORDER — SERTRALINE HCL 100 MG PO TABS
100.0000 mg | ORAL_TABLET | Freq: Every day | ORAL | Status: DC
  Administered 2021-12-14: 100 mg via ORAL
  Filled 2021-12-13 (×2): qty 1

## 2021-12-13 MED ORDER — DICLOFENAC SODIUM 1 % EX GEL
4.0000 g | Freq: Two times a day (BID) | CUTANEOUS | Status: DC | PRN
  Administered 2021-12-13 – 2021-12-14 (×2): 4 g via TOPICAL
  Filled 2021-12-13: qty 100

## 2021-12-13 MED ORDER — CHLORPROMAZINE HCL 25 MG PO TABS
75.0000 mg | ORAL_TABLET | Freq: Two times a day (BID) | ORAL | Status: DC
  Administered 2021-12-13 – 2021-12-14 (×2): 75 mg via ORAL
  Filled 2021-12-13 (×4): qty 3

## 2021-12-13 NOTE — BHH Group Notes (Signed)
Adult Psychoeducational Group Not ?Date:  12/13/2021 ?Time:  2527-1292 ?Group Topic/Focus: PROGRESSIVE RELAXATION. A group where deep breathing is taught and tensing and relaxation muscle groups is used. Imagery is used as well.  Pts are asked to imagine 3 pillars that hold them up when they are not able to hold themselves up and to share that with the group. ? ?Participation Level:  did not attend ? ?Bryson Dames A ? ? ?

## 2021-12-13 NOTE — Progress Notes (Signed)
Latest Reference Range & Units 12/13/21 16:46 12/13/21 20:47  ?Glucose-Capillary 70 - 99 mg/dL 237 (H) 231 (H)  ?(H): Data is abnormally high ? ?Patient refused PRN sliding scale HS insulin stating that he only takes pills will not take insulin. Patient educated on his diabetes and insulin orders. Support and encouragement provided. Q 15 minutes safety checks ongoing. Patient remains safe. ?

## 2021-12-13 NOTE — Progress Notes (Signed)
Adult Psychoeducational Group Note ? ?Date:  12/13/2021 ?Time:  10:01 AM ? ?Group Topic/Focus:  ?Goals Group:   The focus of this group is to help patients establish daily goals to achieve during treatment and discuss how the patient can incorporate goal setting into their daily lives to aide in recovery. ? ?Participation Level:  Active ? ?Participation Quality:  Appropriate ? ?Affect:  Appropriate ? ?Cognitive:  Appropriate ? ?Insight: Appropriate ? ?Engagement in Group:  Engaged ? ?Modes of Intervention:  Discussion ? ?Additional Comments:  Patient attended morning orientation/gaol setting group and participated.  ? ?Karlissa Aron W Molinda Bailiff ?12/13/2021, 10:01 AM ?

## 2021-12-13 NOTE — Progress Notes (Signed)
Adult Psychoeducational Group Note ? ?Date:  12/13/2021 ?Time:  10:05 PM ? ?Group Topic/Focus:  ?Wrap-Up Group:   The focus of this group is to help patients review their daily goal of treatment and discuss progress on daily workbooks. ? ?Participation Level:  Active ? ?Participation Quality:  Appropriate and Attentive ? ?Affect:  Appropriate ? ?Cognitive:  Appropriate ? ?Insight: Appropriate ? ?Engagement in Group:  Engaged ? ?Modes of Intervention:  Discussion ? ?Additional Comments:   ?Pt was engaged and vulnerable during group discussion. Pt shared that he is getting better ad managing his emotions instead of lashing out at others. Pt states that his day was "good" and that one of his goals was to better manage his anger. ? ?Duane Salazar ?12/13/2021, 10:05 PM ?

## 2021-12-13 NOTE — Progress Notes (Signed)
Patient has been up in the [employer], he attended group and participated. He was compliant with his medications but had difficulty staying asleep. He was observed by the mht to be pacing around in his room rather than lying down. He received thorazine 50 mg IM to decrease his agitation for not being able to sleep. Safey maintained with 15 min checks. ?

## 2021-12-13 NOTE — Progress Notes (Signed)
[facility] MD Progress Note ? ?12/13/2021 2:46 PM ?Duane Salazar  ?MRN:  [id number] ? ?Subjective: Ausencio states: "My mood has improved a tiny little bit. I want my Thorazine to be increased though. I still feel a little suicidal, and I want to hurt someone on the outside of here, and I'm not telling who." ? ?Assessment For 12/13/2021: Pt's chart reviewed prior to assessment, and his case discussed with his treatment team. Pt with flat affect and depressed mood, attention to personal hygiene and grooming is fair, eye contact is good, speech is clear & coherent. Thought contents are organized and logical, and pt continues to endorse +SI, denies having a plan while hospitalized, and is able to verbally contract for safety in this hospital. He reports a minimal improvement  in his mood. He reports +HI, states that he will not name who he plans to kill outside of the hospital. He reports that he had +AH earlier in the morning, but is unable to tell what he was hearing at that time. He reports +VH of "a black figure peeking at me behind my door, like an animation. All black."  ? ?Pt reports a good appetite, and reports a good sleep quality last night, and reports generalized body pain related to his acromegaly. Pt asking for Voltaren for pain, and medication ordered PRN. He is also asking for an increase in his dose of his Thorazine to better manage his symptoms. Thorazine increased to 150 mg nightly, and 75 mg BID during the day for management of psychosis. Zoloft 50 mg started today for MDD, and will increase to 100 mg daily starting tomorrow. Norvasc was  increased from 5 mg daily to 10 mg daily yesterday, and BP is better controlled today. Blood glucose was 174 this morning. Metformin was increased from 500 mg daily to 500 mg BID yesterday for management of blood glucose. Will make the changes listed above as well as continue other medications as listed below. ? ?HPI:  Duane Salazar is a 46 year old male who was transferred from  [place] long ED for worsening of depression, along with suicidal ideation and homicidal ideation with multiple plans.  Patient has a past psychiatric history significant for schizoaffective disorder, with 2 previous suicide attempts; 1 via OD on meth and the second trying to jump off a bridge.  Patient has had multiple psychiatric hospitalizations over the last few years. He was admitted voluntarily for treatment and stabilization of his mood. ? ?Principal Problem: Schizoaffective disorder, depressive type ([place]) ?Diagnosis: Principal Problem: ?  Schizoaffective disorder, depressive type ([place]) ?Active Problems: ?  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[place]) ?  Essential hypertension ?  Morbid obesity with BMI of 40.0-44.9, adult ([place]) ?  Cocaine use disorder, severe, dependence ([place]) ?  Cannabis use disorder, mild, abuse ?  Diabetes ([place]) ? ?Total Time spent with patient: 20 minutes ? ?Past Psychiatric History: As above ? ?Past Medical History:  ?Past Medical History:  ?Diagnosis Date  ? 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[place]) 2004  ? AKI (acute kidney injury) ([place]) 03/21/2015  ? Arthritis Dx 2002  ? Diabetes type 2, controlled ([place]) 2010  ? Drug abuse ([place])   ? Headache   ? Hypertension Dx 2002  ? 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 Schizo affective schizophrenia ([place]) 1995  ? Schizophrenia ([place])   ? Seizures ([place])   ? Sleep apnea 1995  ? on CPAP  ?  ?Past Surgical History:  ?Procedure Laterality Date  ? PITUITARY SURGERY  2005 & 2012  ? SKIN BIOPSY    ? ?  Family History:  ?Family History  ?Problem Relation Age of Onset  ? Hypertension Mother   ? Diabetes Mother   ? Heart Problems Mother   ? Cancer Maternal Uncle   ? Alcoholism Maternal Uncle   ? Cancer Maternal Grandmother   ? Heart disease Neg Hx   ? ?Family Psychiatric  History: As above ?Social History:  ?Social History  ? ?Substance and Sexual Activity  ?Alcohol Use Yes  ? Alcohol/week: 2.0 standard drinks  ? Types: 2 Cans of beer per week  ? Comment: 2 cans on workdays; 6-pk on days off; varies  ?    ?Social History  ? ?Substance and Sexual Activity  ?Drug Use Yes  ? Types: Cocaine, Marijuana, Heroin  ? Comment: Reports use 2 grams Cocaine 10/28/21  ?  ?Social History  ? ?Socioeconomic History  ? Marital status: Single  ?  Spouse name: Not on file  ? Number of children: 2  ? Years of education: GED  ? Highest education level: Not on file  ?Occupational History  ? Not on file  ?Tobacco Use  ? Smoking status: Every Day  ?  Packs/day: 1.00  ?  Years: 20.00  ?  Pack years: 20.00  ?  Types: Cigarettes  ? Smokeless tobacco: Never  ? Tobacco comments:  ?  Smoking .5 ppd  ?Substance and Sexual Activity  ? Alcohol use: Yes  ?  Alcohol/week: 2.0 standard drinks  ?  Types: 2 Cans of beer per week  ?  Comment: 2 cans on workdays; 6-pk on days off; varies  ? Drug use: Yes  ?  Types: Cocaine, Marijuana, Heroin  ?  Comment: Reports use 2 grams Cocaine 10/28/21  ? Sexual activity: Yes  ?Other Topics Concern  ? Not on file  ?Social History Narrative  ? Lives with mom.  ? Incarcerated for 22 months in [place], [place]. From 2014-09/2014  ? ?Social Determinants of Health  ? ?Financial Resource Strain: Not on file  ?Food Insecurity: Not on file  ?Transportation Needs: Not on file  ?Physical Activity: Not on file  ?Stress: Not on file  ?Social Connections: Not on file  ? ? Sleep: Fair ? ?Appetite:  Good ? ?Current Medications: ?Current Facility-Administered Medications  ?Medication Dose Route Frequency Provider Last Rate Last Admin  ? acetaminophen (TYLENOL) tablet 650 mg  650 mg Oral Q6H PRN Charmaine Downs C, NP   650 mg at 12/11/21 1116  ? alum & mag hydroxide-simeth (MAALOX/MYLANTA) 200-200-20 MG/5ML suspension 30 mL  30 mL Oral Q4H PRN Charmaine Downs C, NP      ? amLODipine (NORVASC) tablet 10 mg  10 mg Oral Daily Massengill, Ovid Curd, MD   10 mg at 12/13/21 0819  ? benztropine (COGENTIN) tablet 0.5 mg  0.5 mg Oral BID Charmaine Downs C, NP   0.5 mg at 12/13/21 6222  ? celecoxib (CELEBREX) capsule 200 mg  200 mg Oral Daily  Charmaine Downs C, NP   200 mg at 12/13/21 0819  ? chlorproMAZINE (THORAZINE) injection 50 mg  50 mg Intramuscular TID PRN Janine Limbo, MD   50 mg at 12/12/21 2331  ? chlorproMAZINE (THORAZINE) tablet 150 mg  150 mg Oral QHS Sophy Mesler, NP      ? chlorproMAZINE (THORAZINE) tablet 75 mg  75 mg Oral BID Nicholes Rough, NP      ? diclofenac Sodium (VOLTAREN) 1 % topical gel 4 g  4 g Topical BID PRN Nicholes Rough, NP   4 g at 12/13/21 1254  ?  divalproex (DEPAKOTE) DR tablet 750 mg  750 mg Oral BID Charmaine Downs C, NP   750 mg at 12/13/21 0819  ? fluticasone (FLONASE) 50 MCG/ACT nasal spray 1 spray  1 spray Each Nare PRN Charmaine Downs C, NP      ? gabapentin (NEURONTIN) capsule 400 mg  400 mg Oral TID Charmaine Downs C, NP   400 mg at 12/13/21 1146  ? hydrOXYzine (ATARAX) tablet 25 mg  25 mg Oral TID PRN Charmaine Downs C, NP   25 mg at 12/11/21 1118  ? insulin aspart (novoLOG) injection 0-5 Units  0-5 Units Subcutaneous QHS Onuoha, Josephine C, NP      ? losartan (COZAAR) tablet 50 mg  50 mg Oral Daily Onuoha, Josephine C, NP   50 mg at 12/13/21 8338  ? magnesium hydroxide (MILK OF MAGNESIA) suspension 30 mL  30 mL Oral Daily PRN Charmaine Downs C, NP      ? menthol-cetylpyridinium (CEPACOL) lozenge 3 mg  1 lozenge Oral Q6H PRN Janine Limbo, MD   3 mg at 12/13/21 1149  ? metFORMIN (GLUCOPHAGE) tablet 500 mg  500 mg Oral BID WC Nicholes Rough, NP   500 mg at 12/13/21 0819  ? [START ON 12/14/2021] sertraline (ZOLOFT) tablet 100 mg  100 mg Oral Daily Aelyn Stanaland, NP      ? traZODone (DESYREL) tablet 100 mg  100 mg Oral QHS PRN Charmaine Downs C, NP   100 mg at 12/12/21 2125  ? ? ?Lab Results:  ?Results for orders placed or performed during the hospital encounter of 12/10/21 (from the past 48 hour(s))  ?Glucose, capillary     Status: Abnormal  ? Collection Time: 12/11/21  5:04 PM  ?Result Value Ref Range  ? Glucose-Capillary 172 (H) 70 - 99 mg/dL  ?  Comment: Glucose reference range  applies only to samples taken after fasting for at least 8 hours.  ?Glucose, capillary     Status: Abnormal  ? Collection Time: 12/11/21  8:58 PM  ?Result Value Ref Range  ? Glucose-Capillary 212 (H) 70 - 9

## 2021-12-13 NOTE — BHH Group Notes (Signed)
Adult Psychoeducational Group  ?Date:  12/01/14/2023 ?Time:  1610-9604 ? ?Group Topic/Focus: Continuation of the group from Saturday. Looking at the lists that were created and talking about what needs to be done with the homework of 30 positives about themselves.  ?                                   Talking about taking their power back and helping themselves to develop a positive self esteem. ?     ?Participation Quality:  Appropriate ? ?Affect:  Appropriate ? ?Cognitive:  Oriented ? ?Insight: Improving ? ?Engagement in Group:  Engaged ? ?Modes of Intervention:  Activity, Discussion, Education, and Support ? ?Additional Comments:  Rates energy at 5/10. Slept in the group. ? ?Duane Salazar A ? ? ?

## 2021-12-13 NOTE — Group Note (Signed)
[facility] LCSW Group Therapy Note ? ?12/13/2021   10:00-11:00AM ? ?Type of Therapy and Topic:  Group Therapy:  Unhealthy versus Healthy Supports, Which Am I? ? ?Participation Level:  Active  ? ?Description of Group:  Patients in this group were introduced to the concept that additional supports including self-support are an essential part of recovery.  Initially a discussion was held about the differences between healthy versus unhealthy supports.  Patients were asked to share what unhealthy supports in their lives need to be addressed, as well as what additional healthy supports could be added for greater help in reaching their goals.   A song entitled "My Own Hero" was played and a group discussion ensued in which patients stated they could relate to the song and it inspired them to realize they have be willing to help themselves in order to succeed, because other people cannot achieve sobriety or stability for them.  We discussed adding a variety of healthy supports to address the various needs in patient lives, including becoming more self-supportive.  A song was played called "I Know Where I've Been" toward the end of group and used to conduct an inspirational wrap-up to group of remembering how far they have already come in their journey. ? ?Therapeutic Goals: ?1)  Highlight the differences between healthy and unhealthy supports ?2)  Suggest the importance of being a part of one's own support system ?2)  Discuss reasons people in one's life may eventually be unable to be continually supportive ? 3)  Identify the patient's current support system  ? 4) Elicit commitments to add healthy supports and to become more conscious of being self-supportive ?  ?Summary of Patient Progress:  The patient listed as healthy supports the following:  family, kids.  The patient expressed that unhealthy supports to be addressed include all of his previous romantic partners and himself.  The part of himself that he calls "Josph Macho" is the  one who makes really bad decisions.  He proved more insightful in group today than previously. ? ?Therapeutic Modalities:   ?Motivational Interviewing ?Activity ? ?Maretta Los , MSW, LCSW  ?

## 2021-12-13 NOTE — Progress Notes (Addendum)
D. Pt continues to present with a flat affect/ depressed mood- endorsed passive SI this am, agreeing to contact staff before acting on any harmful thoughts. Pt continues to endorse HI toward an individual outside of the facility, but won't disclose who. Pt stated that his A/VH are intermittent, and 'random' in content. Pt rated his depression, hopelessness and anxiety an 8/5/4, respectively.  A. Labs and vitals monitored. Pt given and educated on medications. Pt supported emotionally and encouraged to express concerns and ask questions.   ?R. Pt remains safe with 15 minute checks. Will continue POC. ? ?  ?

## 2021-12-14 ENCOUNTER — Encounter (HOSPITAL_COMMUNITY): Payer: Self-pay

## 2021-12-14 ENCOUNTER — Other Ambulatory Visit: Payer: Self-pay

## 2021-12-14 DIAGNOSIS — F251 Schizoaffective disorder, depressive type: Principal | ICD-10-CM

## 2021-12-14 LAB — GLUCOSE, CAPILLARY: Glucose-Capillary: 145 mg/dL — ABNORMAL HIGH (ref 70–99)

## 2021-12-14 MED ORDER — CHLORPROMAZINE HCL 25 MG PO TABS
75.0000 mg | ORAL_TABLET | Freq: Two times a day (BID) | ORAL | 0 refills | Status: DC
  Filled 2021-12-14 – 2022-03-09 (×2): qty 180, 30d supply, fill #0

## 2021-12-14 MED ORDER — METFORMIN HCL 500 MG PO TABS
500.0000 mg | ORAL_TABLET | Freq: Two times a day (BID) | ORAL | 0 refills | Status: DC
  Filled 2021-12-14 – 2022-01-26 (×2): qty 60, 30d supply, fill #0

## 2021-12-14 MED ORDER — AMLODIPINE BESYLATE 10 MG PO TABS
10.0000 mg | ORAL_TABLET | Freq: Every day | ORAL | 0 refills | Status: DC
  Filled 2021-12-14 – 2022-01-26 (×2): qty 30, 30d supply, fill #0

## 2021-12-14 MED ORDER — GABAPENTIN 400 MG PO CAPS
400.0000 mg | ORAL_CAPSULE | Freq: Three times a day (TID) | ORAL | 0 refills | Status: DC
  Filled 2021-12-14: qty 90, 30d supply, fill #0

## 2021-12-14 MED ORDER — HYDROXYZINE HCL 25 MG PO TABS
25.0000 mg | ORAL_TABLET | Freq: Four times a day (QID) | ORAL | 0 refills | Status: AC | PRN
  Filled 2021-12-14 – 2022-01-26 (×2): qty 60, 15d supply, fill #0

## 2021-12-14 MED ORDER — DIVALPROEX SODIUM 250 MG PO DR TAB
750.0000 mg | DELAYED_RELEASE_TABLET | Freq: Two times a day (BID) | ORAL | 0 refills | Status: DC
  Filled 2021-12-14 – 2022-01-26 (×2): qty 180, 30d supply, fill #0

## 2021-12-14 MED ORDER — TRAZODONE HCL 100 MG PO TABS
100.0000 mg | ORAL_TABLET | Freq: Every evening | ORAL | 0 refills | Status: DC | PRN
  Filled 2021-12-14 – 2022-01-26 (×2): qty 30, 30d supply, fill #0

## 2021-12-14 MED ORDER — CHLORPROMAZINE HCL 50 MG PO TABS
150.0000 mg | ORAL_TABLET | Freq: Every day | ORAL | 0 refills | Status: AC
  Filled 2021-12-14 – 2022-01-26 (×2): qty 90, 30d supply, fill #0

## 2021-12-14 MED ORDER — SERTRALINE HCL 100 MG PO TABS
100.0000 mg | ORAL_TABLET | Freq: Every day | ORAL | 0 refills | Status: DC
  Filled 2021-12-14 – 2022-01-26 (×2): qty 30, 30d supply, fill #0

## 2021-12-14 MED ORDER — BENZTROPINE MESYLATE 0.5 MG PO TABS
0.5000 mg | ORAL_TABLET | Freq: Two times a day (BID) | ORAL | 0 refills | Status: AC
  Filled 2021-12-14 – 2022-01-26 (×2): qty 60, 30d supply, fill #0

## 2021-12-14 MED ORDER — DIVALPROEX SODIUM 250 MG PO DR TAB
750.0000 mg | DELAYED_RELEASE_TABLET | Freq: Two times a day (BID) | ORAL | Status: DC
  Filled 2021-12-14 (×2): qty 42

## 2021-12-14 NOTE — Group Note (Signed)
Recreation Therapy Group Note ? ? ?Group Topic:Stress Management  ?Group Date: 12/14/2021 ?Start Time: [phone] ?End Time: 3403 ?Facilitators: Victorino Sparrow, LRT,CTRS ?Location: [place] ? ? ?Goal Area(s) Addresses:  ?Patient will actively participate in stress management techniques presented during session.  ?Patient will successfully identify benefit of practicing stress management post d/c.  ? ?Group Description:  Guided Imagery. LRT provided education, instruction, and demonstration on practice of visualization via guided imagery. Patient was asked to participate in the technique introduced during session. LRT debriefed including topics of mindfulness, stress management and specific scenarios each patient could use these techniques. Patients were given suggestions of ways to access scripts post d/c and encouraged to explore Youtube and other apps available on smartphones, tablets, and computers. ? ? ?Affect/Mood: N/A ?  ?Participation Level: Did not attend ?  ? ?Clinical Observations/Individualized Feedback:   ?  ? ?Plan: Continue to engage patient in RT group sessions 2-3x/week. ? ? ?Victorino Sparrow, LRT,CTRS ?12/14/2021 10:53 AM ?

## 2021-12-14 NOTE — BHH Suicide Risk Assessment (Signed)
[facility] INPATIENT:  Family/Significant Other Suicide Prevention Education ? ?Suicide Prevention Education:  ?Education Completed; Landen Knoedler [phone] (Mother) has been identified by the patient as the family member/significant other with whom the patient will be residing, and identified as the person(s) who will aid the patient in the event of a mental health crisis (suicidal ideations/suicide attempt).  With written consent from the patient, the family member/significant other has been provided the following suicide prevention education, prior to the and/or following the discharge of the patient. ? ?The suicide prevention education provided includes the following: ?Suicide risk factors ?Suicide prevention and interventions ?National Suicide Hotline telephone number ?[facility] assessment telephone number ?[facility] Emergency Assistance 911 ?[place] and/or Residential Mobile Crisis Unit telephone number ? ?Request made of family/significant other to: ?Remove weapons (e.g., guns, rifles, knives), all items previously/currently identified as safety concern.   ?Remove drugs/medications (over-the-counter, prescriptions, illicit drugs), all items previously/currently identified as a safety concern. ? ?The family member/significant other verbalizes understanding of the suicide prevention education information provided.  The family member/significant other agrees to remove the items of safety concern listed above. ? ?CSW spoke with Mrs. Wilcoxson about SPE and about her son discharging today. She states that her son needs long-term mental health and substance use treatment.  She states that her son cannot come back to her home without being in a program for 30-days or more.  Mrs. Humphres states that she would like CSW to give her son a list of shelters and a list of treatment centers.  CSW explained the discharge instructions for her son.  Mrs. Bruna states there are no known firearms or weapons in  her son's possession.  CSW completed SPE with Mrs. Majer.   ? ?CSW was contacted again by Mrs. Kropf within 15 minutes of the SPE and was informed that she was in the lobby and would be picking her son up from the hospital.  She states that she will be taking him to a lawyer to see about Disability and Medicaid services.  CSW will discharge the Pt to his mother.  CSW will also inform the providers of this information.  ? ?Darleen Crocker ?12/14/2021, 10:59 AM ?

## 2021-12-14 NOTE — Progress Notes (Signed)
?   12/14/21 0806  ?Psych Admission Type (Psych Patients Only)  ?Admission Status Voluntary  ?Psychosocial Assessment  ?Patient Complaints None  ?Eye Contact Brief  ?Facial Expression Masked  ?Affect Other (Comment) ?(Sligthly bright)  ?Speech Logical/coherent  ?Interaction Assertive;Arrogant  ?Motor Activity Slow  ?Appearance/Hygiene Unremarkable  ?Behavior Characteristics Cooperative;Appropriate to situation  ?Mood Euthymic  ?Thought Process  ?Coherency WDL  ?Content Preoccupation  ?Delusions None reported or observed  ?Perception WDL  ?Hallucination None reported or observed  ?Judgment Poor  ?Confusion None  ?Danger to Self  ?Current suicidal ideation? Denies  ?Danger to Others  ?Danger to Others None reported or observed  ?Danger to Others Abnormal  ?Harmful Behavior to others No threats or harm toward other people  ?Destructive Behavior No threats or harm toward property  ? ? ?

## 2021-12-14 NOTE — Progress Notes (Signed)
?  [facility] Adult Case Management Discharge Plan : ? ?Will you be returning to the same living situation after discharge:  No. Mother's Home  ?At discharge, do you have transportation home?: Yes,  Taxi  ?Do you have the ability to pay for your medications: Yes,  Insurance  ? ?Release of information consent forms completed and in the chart;  Patient's signature needed at discharge. ? ?Patient to Follow up at: ? Follow-up Information   ? ? [facility] Follow up.   ?Specialty: Behavioral Health ?Why: Please call to schedule an appointment or you may go to this provider for therapy and medication management services during walk in days:  Mondays and Wednesdays, at 7:30 am (services are provided on a first come, first served basis - please arrive early) ?Contact information: ?[address] ?[place] [address] ?[phone] ? ?  ?  ? ?  ?  ? ?  ? ? ?Next level of care provider has access to [place] ? ?Safety Planning and Suicide Prevention discussed: Yes,  with patient  ? ?  ? ?Has patient been referred to the Quitline?: Patient refused referral ? ?Patient has been referred for addiction treatment: Yes However Pt's insurance does not cover inpatient substance use treatment at this time.  Outpatient [facility] at [facility] are recommended.  ? ?Darleen Crocker, LCSWA ?12/14/2021, 9:57 AM ?

## 2021-12-14 NOTE — Plan of Care (Signed)
?  Problem: Coping: ?Goal: Ability to verbalize frustrations and anger appropriately will improve ?Outcome: Progressing ?Goal: Ability to demonstrate self-control will improve ?Outcome: Progressing ?  ?Problem: Physical Regulation: ?Goal: Ability to maintain clinical measurements within normal limits will improve ?Outcome: Progressing ?  ?Problem: Education: ?Goal: Ability to make informed decisions regarding treatment will improve ?Outcome: Progressing ?  ?

## 2021-12-14 NOTE — BHH Suicide Risk Assessment (Cosign Needed)
Suicide Risk Assessment ? ?Discharge Assessment    ?[facility] Discharge Suicide Risk Assessment ? ? ?Principal Problem: Schizoaffective disorder, depressive type ([place]) ?Discharge Diagnoses: Principal Problem: ?  Schizoaffective disorder, depressive type ([place]) ?Active Problems: ?  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  Essential hypertension ?  Morbid obesity with BMI of 40.0-44.9, adult ([place]) ?  Cocaine use disorder, severe, dependence ([place]) ?  Cannabis use disorder, mild, abuse ?  Diabetes ([place]) ? ?Reason for Admission:  Duane Salazar is a 46 year old male who was transferred from [place] long ED for worsening of depression, along with suicidal ideation and homicidal ideation with multiple plans.  Patient has a past psychiatric history significant for schizoaffective disorder, with 2 previous suicide attempts; 1 via OD on meth and the second trying to jump off a bridge.  Patient has had multiple psychiatric hospitalizations over the last few years. He was admitted voluntarily for treatment and stabilization of his mood. ? ?                                            HOSPITAL COURSE: ?During the patient's hospitalization, patient had extensive initial psychiatric evaluation, and follow-up psychiatric evaluations every day.  Psychiatric diagnoses provided & medications started upon initial assessment were as follows: ? ?-Risperdal dc'd to as to maximize Thorazine as monotherapy ?-Change Thorazine from PRN to 50 mg TID for psychosis ?-Continue Depakote 750 mg BID for mood stabilization ?-Continue Gabapentin 400 mg TID for anxiety ?-Continue Cogentin 0.5 mg daily for EPS prophylaxis ?-Continue Hydroxyzine 25 mg TID PRN for anxiety ?-Continue Norvasc 5 mg daily for htn ?-Continue Insulin Novolog 0-5 units as per sliding scale nightly ?-Continue Losartan 50 mg daily for hypertension ?-Continue Metformin 500 mg daily with breakfast for diabetes ?-Continue Trazodone 100 mg nightly as needed for insomnia ? ?During the hospitalization,  other adjustments were made to the patient's psychiatric medication regimen with the final discharge medications being as follows:  ?-Risperdal dc'd to as to maximize Thorazine as monotherapy ?-Increased Thorazine to 75 mg BID & 150 mg nightly for psychosis  ?-Started  Zoloft 50 mg & increased to 100 mg daily for MDD ?-Continue Depakote 750 mg BID for mood stabilization ?-Continue Gabapentin 400 mg TID for anxiety ?-Continue Cogentin 0.5 mg daily for EPS prophylaxis ?-Continue Hydroxyzine 25 mg TID PRN for anxiety ?-Continue Norvasc 10 mg daily for htn ?-Continue Insulin Novolog 0-5 units as per sliding scale nightly ?-Continue Losartan 50 mg daily for hypertension ?-Continue Metformin 500 mg BID with breakfast for diabetes ?-Continue Trazodone 100 mg nightly as needed for insomnia ? ?Patient's care was discussed during the interdisciplinary team meeting every day during the hospitalization. The patient denies having side effects to prescribed psychiatric medication. The patient was evaluated each day by a clinical provider to ascertain response to treatment. Improvement was noted by the patient's report of decreasing symptoms, improved sleep and appetite, affect, medication tolerance, behavior, and participation in unit programming.  Patient was asked each day to complete a self inventory noting mood, mental status, pain, new symptoms, anxiety and concerns.  Symptoms were reported as significantly decreased or resolved completely by discharge. The patient reports that their mood is stable.  ?The patient denied having suicidal thoughts for more than 48 hours prior to discharge.  Patient denies having homicidal thoughts.  Patient denies having auditory hallucinations.  Patient denies any visual hallucinations or other  symptoms of psychosis. The patient was motivated to continue taking medication with a goal of continued improvement in mental health.  ? ?The patient reports their target psychiatric symptoms of  depression & anxiety responded well to the psychiatric medications, and the patient reports overall benefit from this psychiatric hospitalization. Supportive psychotherapy was provided to the patient. The patient also participated in regular group therapy while hospitalized. Coping skills, problem solving as well as relaxation therapies were also part of the unit programming. ? ?Labs were reviewed with the patient, and abnormal results were discussed with the patient and he is agreeable to following up all abnormal labs and medical conditions with his primary care provider after discharge. ? ?Total Time spent with patient: 30 minutes ? ?Musculoskeletal: ?Strength & Muscle Tone: within normal limits ?Gait & Station: unsteady ?Patient leans: N/A ? ?Psychiatric Specialty Exam ? ?Presentation  ?General Appearance: Appropriate for Environment; Fairly Groomed ? ?Eye Contact:Good ? ?Speech:Clear and Coherent ? ?Speech Volume:Normal ? ?Handedness:Right ? ? ?Mood and Affect  ?Mood:Euthymic ? ?Duration of Depression Symptoms: Greater than two weeks ? ?Affect:Appropriate ? ? ?Thought Process  ?Thought Processes:Coherent ? ?Descriptions of Associations:Intact ? ?Orientation:Full (Time, Place and Person) ? ?Thought Content:Logical ? ?History of Schizophrenia/Schizoaffective disorder:Yes ? ?Duration of Psychotic Symptoms:Greater than six months ? ?Hallucinations:Hallucinations: None ? ?Ideas of Reference:None ? ?Suicidal Thoughts:Suicidal Thoughts: No ?SI Active Intent and/or Plan: Without Intent ? ?Homicidal Thoughts:Homicidal Thoughts: No ?HI Active Intent and/or Plan: Without Intent; Without Plan ? ? ?Sensorium  ?Memory:Immediate Good ? ?Judgment:Good ? ?Insight:Good ? ?Executive Functions  ?Concentration:Good ? ?Attention Span:Good ? ?Recall:Good ? ?Fund of [place] ? ?Language:Good ? ?Psychomotor Activity  ?Psychomotor Activity:Psychomotor Activity: Normal ? ?Assets  ?Assets:Communication Skills ? ?Sleep  ?Sleep:Sleep:  Good ? ?Physical Exam: ?Physical Exam ?Constitutional:   ?   Appearance: Normal appearance.  ?HENT:  ?   Head: Normocephalic.  ?   Nose: Nose normal. No congestion or rhinorrhea.  ?Eyes:  ?   Pupils: Pupils are equal, round, and reactive to light.  ?Musculoskeletal:     ?   General: Normal range of motion.  ?   Cervical back: Normal range of motion. No rigidity.  ?Neurological:  ?   Mental Status: He is alert and oriented to person, place, and time.  ?Psychiatric:     ?   Behavior: Behavior normal.  ? ?Review of Systems  ?Constitutional: Negative.  Negative for fever.  ?HENT: Negative.  Negative for sore throat.   ?Eyes: Negative.   ?Respiratory: Negative.  Negative for cough.   ?Cardiovascular: Negative.   ?Gastrointestinal: Negative.   ?Genitourinary: Negative.   ?Musculoskeletal: Negative.   ?Skin: Negative.   ?Neurological: Negative.   ?Psychiatric/Behavioral:  Positive for depression (pt reports that his depressive symptoms are resolving and denies SI/HI/AVH and verbally contracts for safety outside of the hospital). Negative for suicidal ideas.   ?Blood pressure 122/75, pulse 100, temperature (!) 97.5 ?F (36.4 ?C), temperature source Oral, resp. rate 20, height '6\' 3"'$  (1.905 m), weight 121.6 kg, SpO2 99 %. Body mass index is 33.5 kg/m?. ? ?Mental Status Per Nursing Assessment::   ?On Admission:  Suicidal ideation indicated by patient, Self-harm thoughts ? ?Demographic Factors:  ?Male and Unemployed ? ?Loss Factors: ?NA ? ?Historical Factors: ?Impulsivity ? ?Risk Reduction Factors:   ?NA ? ?Continued Clinical Symptoms:  ?Pt reports a resolution in his depressive symptoms and denies SI/HI/AVH. He is able to verbally contract for safety outside of the hospital. ? ?Cognitive Features That Contribute To Risk:  ?  None   ? ?Suicide Risk:  ?Minimal: No identifiable suicidal ideation.  Patients presenting with no risk factors but with morbid ruminations; may be classified as minimal risk based on the severity of the  depressive symptoms ? ? Follow-up Information   ? ? [facility] Follow up.   ?Specialty: Behavioral Health ?Why: Please call to schedule an appointment or you may go to this provider

## 2021-12-14 NOTE — BHH Group Notes (Signed)
Spiritual care group on grief and loss facilitated by chaplain Janne Napoleon, [facility]  ? ?Group Goal:  ? ?Support / Education around grief and loss  ? ?Members engage in facilitated group support and psycho-social education.  ? ?Group Description:  ? ?Following introductions and group rules, group members engaged in facilitated group dialog and support around topic of loss, with particular support around experiences of loss in their lives. Group Identified types of loss (relationships / self / things) and identified patterns, circumstances, and changes that precipitate losses. Reflected on thoughts / feelings around loss, normalized grief responses, and recognized variety in grief experience. Group noted Worden's four tasks of grief in discussion.  ? ?Group drew on Adlerian / Rogerian, narrative, MI,  ? ?Patient Progress: Duane Salazar attended group and was an active participant in the conversation.  He identified music and reaching out for support as coping skills.  His comments showed insight and he was respectful of peers. ? ?Duane Salazar, Bcc ?Pager, [phone] ? ?

## 2021-12-14 NOTE — Progress Notes (Signed)
[name] D/C'd Home per MD order.  Discussed with the patient and all questions fully answered. ? ?VSS, Skin clean, dry and intact without evidence of skin break down, no evidence of skin tears noted. ? ?An After Visit Summary was printed and given to the patient. Patient received prescription information to collect his medication at the community [facility] pharmacy. ? ?D/c education completed with patient including follow up instructions, medication list, d/c activities limitations if indicated, with other d/c instructions as indicated by MD - patient able to verbalize understanding, all questions fully answered.  ? ?Patient escorted to the main entrance to meet his mom, and D/C home via private auto at 1150. ? ?Duane Salazar [name] ?12/14/2021 12:08 PM  ?

## 2021-12-14 NOTE — BHH Counselor (Signed)
CSW offered the Pt a packet of information including shelter and housing resources, food and clothing resources, a [employer], and suicide prevention information.  The Pt refused this information and stated that he did not need it.  CSW explained to the Pt that she would hold on to the information and that it would be available until 12:00pm when the Pt is discharged.    ?

## 2021-12-14 NOTE — BH IP Treatment Plan (Signed)
Interdisciplinary Treatment and Diagnostic Plan Update ? ?12/14/2021 ?Time of Session: 10:10am  ?Duane Salazar ?MRN: [id number] ? ?Principal Diagnosis: Schizoaffective disorder, depressive type ([place]) ? ?Secondary Diagnoses: Principal Problem: ?  Schizoaffective disorder, depressive type ([place]) ?Active Problems: ?  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[place]) ?  Essential hypertension ?  Morbid obesity with BMI of 40.0-44.9, adult ([place]) ?  Cocaine use disorder, severe, dependence ([place]) ?  Cannabis use disorder, mild, abuse ?  Diabetes ([place]) ? ? ?Current Medications:  ?Current Facility-Administered Medications  ?Medication Dose Route Frequency Provider Last Rate Last Admin  ? acetaminophen (TYLENOL) tablet 650 mg  650 mg Oral Q6H PRN Charmaine Downs C, NP   650 mg at 12/11/21 1116  ? alum & mag hydroxide-simeth (MAALOX/MYLANTA) 200-200-20 MG/5ML suspension 30 mL  30 mL Oral Q4H PRN Charmaine Downs C, NP      ? amLODipine (NORVASC) tablet 10 mg  10 mg Oral Daily Massengill, Ovid Curd, MD   10 mg at 12/14/21 0759  ? benztropine (COGENTIN) tablet 0.5 mg  0.5 mg Oral BID Charmaine Downs C, NP   0.5 mg at 12/14/21 0759  ? celecoxib (CELEBREX) capsule 200 mg  200 mg Oral Daily Onuoha, Josephine C, NP   200 mg at 12/14/21 0759  ? chlorproMAZINE (THORAZINE) injection 50 mg  50 mg Intramuscular TID PRN Janine Limbo, MD   50 mg at 12/12/21 2331  ? chlorproMAZINE (THORAZINE) tablet 150 mg  150 mg Oral QHS Nicholes Rough, NP   150 mg at 12/13/21 2128  ? chlorproMAZINE (THORAZINE) tablet 75 mg  75 mg Oral BID Nicholes Rough, NP   75 mg at 12/14/21 0759  ? diclofenac Sodium (VOLTAREN) 1 % topical gel 4 g  4 g Topical BID PRN Nicholes Rough, NP   4 g at 12/14/21 0758  ? divalproex (DEPAKOTE) DR tablet 750 mg  750 mg Oral BID Charmaine Downs C, NP   750 mg at 12/14/21 0802  ? fluticasone (FLONASE) 50 MCG/ACT nasal spray 1 spray  1 spray Each Nare PRN Charmaine Downs C, NP      ? gabapentin (NEURONTIN) capsule 400 mg  400 mg Oral TID  Charmaine Downs C, NP   400 mg at 12/14/21 0800  ? hydrOXYzine (ATARAX) tablet 25 mg  25 mg Oral TID PRN Charmaine Downs C, NP   25 mg at 12/13/21 2128  ? insulin aspart (novoLOG) injection 0-5 Units  0-5 Units Subcutaneous QHS Onuoha, Josephine C, NP      ? losartan (COZAAR) tablet 50 mg  50 mg Oral Daily Onuoha, Josephine C, NP   50 mg at 12/14/21 0800  ? magnesium hydroxide (MILK OF MAGNESIA) suspension 30 mL  30 mL Oral Daily PRN Charmaine Downs C, NP      ? menthol-cetylpyridinium (CEPACOL) lozenge 3 mg  1 lozenge Oral Q6H PRN Janine Limbo, MD   3 mg at 12/13/21 1149  ? metFORMIN (GLUCOPHAGE) tablet 500 mg  500 mg Oral BID WC Nkwenti, Doris, NP   500 mg at 12/14/21 0800  ? sertraline (ZOLOFT) tablet 100 mg  100 mg Oral Daily Nicholes Rough, NP   100 mg at 12/14/21 0801  ? traZODone (DESYREL) tablet 100 mg  100 mg Oral QHS PRN Charmaine Downs C, NP   100 mg at 12/13/21 2129  ? ?Current Outpatient Medications  ?Medication Sig Dispense Refill  ? [START ON 12/15/2021] amLODipine (NORVASC) 10 MG tablet Take 1 tablet (10 mg total) by mouth daily. 30 tablet 0  ? benztropine (COGENTIN)  0.5 MG tablet Take 1 tablet (0.5 mg total) by mouth 2 (two) times daily. 60 tablet 0  ? chlorproMAZINE (THORAZINE) 25 MG tablet Take 3 tablets (75 mg total) by mouth 2 (two) times daily. 180 tablet 0  ? chlorproMAZINE (THORAZINE) 50 MG tablet Take 3 tablets (150 mg total) by mouth at bedtime. 90 tablet 0  ? divalproex (DEPAKOTE) 250 MG DR tablet Take 3 tablets (750 mg total) by mouth 2 (two) times daily. 180 tablet 0  ? fluticasone (FLONASE) 50 MCG/ACT nasal spray Place 1 spray into both nostrils daily as needed for allergies or rhinitis. (Patient not taking: Reported on 12/09/2021) 16 g 0  ? gabapentin (NEURONTIN) 400 MG capsule Take 1 capsule (400 mg total) by mouth 3 (three) times daily. 90 capsule 0  ? hydrOXYzine (ATARAX) 25 MG tablet Take 1 tablet (25 mg total) by mouth every 6 (six) hours as needed for anxiety. 60  tablet 0  ? metFORMIN (GLUCOPHAGE) 500 MG tablet Take 1 tablet (500 mg total) by mouth 2 (two) times daily with a meal. 60 tablet 0  ? [START ON 12/15/2021] sertraline (ZOLOFT) 100 MG tablet Take 1 tablet (100 mg total) by mouth daily. 30 tablet 0  ? traZODone (DESYREL) 100 MG tablet Take 1 tablet (100 mg total) by mouth at bedtime as needed for sleep. 30 tablet 0  ? ?PTA Medications: ?No medications prior to admission.  ? ? ?Patient Stressors: Financial difficulties   ?Medication change or noncompliance   ?Substance abuse   ? ?Patient Strengths: Capable of independent living  ?Communication skills  ?Religious Affiliation  ?Supportive family/friends  ? ?Treatment Modalities: Medication Management, Group therapy, Case management,  ?1 to 1 session with clinician, Psychoeducation, Recreational therapy. ? ? ?Physician Treatment Plan for Primary Diagnosis: Schizoaffective disorder, depressive type ([place]) ?Long Term Goal(s): Improvement in symptoms so as ready for discharge  ? ?Short Term Goals: Ability to identify changes in lifestyle to reduce recurrence of condition will improve ?Ability to verbalize feelings will improve ?Ability to disclose and discuss suicidal ideas ?Ability to identify and develop effective coping behaviors will improve ?Ability to identify triggers associated with substance abuse/mental health issues will improve ? ?Medication Management: Evaluate patient's response, side effects, and tolerance of medication regimen. ? ?Therapeutic Interventions: 1 to 1 sessions, Unit Group sessions and Medication administration. ? ?Evaluation of Outcomes: Adequate for Discharge ? ?Physician Treatment Plan for Secondary Diagnosis: Principal Problem: ?  Schizoaffective disorder, depressive type ([place]) ?Active Problems: ?  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[place]) ?  Essential hypertension ?  Morbid obesity with BMI of 40.0-44.9, adult ([place]) ?  Cocaine use disorder, severe, dependence ([place]) ?  Cannabis use disorder, mild, abuse ?   Diabetes ([place]) ? ?Long Term Goal(s): Improvement in symptoms so as ready for discharge  ? ?Short Term Goals: Ability to identify changes in lifestyle to reduce recurrence of condition will improve ?Ability to verbalize feelings will improve ?Ability to disclose and discuss suicidal ideas ?Ability to identify and develop effective coping behaviors will improve ?Ability to identify triggers associated with substance abuse/mental health issues will improve    ? ?Medication Management: Evaluate patient's response, side effects, and tolerance of medication regimen. ? ?Therapeutic Interventions: 1 to 1 sessions, Unit Group sessions and Medication administration. ? ?Evaluation of Outcomes: Adequate for Discharge ? ? ?RN Treatment Plan for Primary Diagnosis: Schizoaffective disorder, depressive type ([place]) ?Long Term Goal(s): Knowledge of disease and therapeutic regimen to maintain health will improve ? ?Short Term Goals: Ability to remain  free from injury will improve, Ability to participate in decision making will improve, Ability to verbalize feelings will improve, Ability to disclose and discuss suicidal ideas, and Ability to identify and develop effective coping behaviors will improve ? ?Medication Management: RN will administer medications as ordered by provider, will assess and evaluate patient's response and provide education to patient for prescribed medication. RN will report any adverse and/or side effects to prescribing provider. ? ?Therapeutic Interventions: 1 on 1 counseling sessions, Psychoeducation, Medication administration, Evaluate responses to treatment, Monitor vital signs and CBGs as ordered, Perform/monitor CIWA, COWS, AIMS and Fall Risk screenings as ordered, Perform wound care treatments as ordered. ? ?Evaluation of Outcomes: Adequate for Discharge ? ? ?LCSW Treatment Plan for Primary Diagnosis: Schizoaffective disorder, depressive type ([place]) ?Long Term Goal(s): Safe transition to appropriate next  level of care at discharge, Engage patient in therapeutic group addressing interpersonal concerns. ? ?Short Term Goals: Engage patient in aftercare planning with referrals and resources, Increase social suppor

## 2021-12-14 NOTE — Discharge Summary (Signed)
Physician Discharge Summary Note ? ?Patient:  Duane Salazar is an 46 y.o., male ?MRN:  [id number] ?DOB:  [birth year] ?Patient phone:  [phone] (home)  ?Patient address:   ?Homeless ?[place] [address],  ?Total Time spent with patient: 30 minutes ? ?Date of Admission:  12/10/2021 ?Date of Discharge: 12/14/2021 ? ?Reason for Admission:  Duane Salazar is a 46 year old male who was transferred from [facility] for worsening of depression, along with suicidal ideation and homicidal ideation with multiple plans.  Patient has a past psychiatric history significant for schizoaffective disorder, with 2 previous suicide attempts; 1 via OD on meth and the second trying to jump off a bridge.  Patient has had multiple psychiatric hospitalizations over the last few years. He was admitted voluntarily for treatment and stabilization of his mood. ? ?Principal Problem: Schizoaffective disorder, depressive type ([place]) ?Discharge Diagnoses: Principal Problem: ?  Schizoaffective disorder, depressive type ([place]) ?Active Problems: ?  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  Essential hypertension ?  Morbid obesity with BMI of 40.0-44.9, adult ([place]) ?  Cocaine use disorder, severe, dependence ([place]) ?  Cannabis use disorder, mild, abuse ?  Diabetes ([place]) ? ?Past Psychiatric History: As above ? ?Past Medical History:  ?Past Medical History:  ?Diagnosis Date  ? 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[place]) 2004  ? AKI (acute kidney injury) ([place]) 03/21/2015  ? Arthritis Dx 2002  ? Diabetes type 2, controlled ([place]) 2010  ? Drug abuse ([place])   ? Headache   ? Hypertension Dx 2002  ? 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 Schizo affective schizophrenia ([place]) 1995  ? Schizophrenia ([place])   ? Seizures ([place])   ? Sleep apnea 1995  ? on CPAP  ?  ?Past Surgical History:  ?Procedure Laterality Date  ? PITUITARY SURGERY  2005 & 2012  ? SKIN BIOPSY    ? ?Family History:  ?Family History  ?Problem Relation Age of Onset  ? Hypertension Mother   ? Diabetes Mother   ? Heart Problems Mother   ?  Cancer Maternal Uncle   ? Alcoholism Maternal Uncle   ? Cancer Maternal Grandmother   ? Heart disease Neg Hx   ? ?Family Psychiatric  History: As above ?Social History:  ?Social History  ? ?Substance and Sexual Activity  ?Alcohol Use Yes  ? Alcohol/week: 2.0 standard drinks  ? Types: 2 Cans of beer per week  ? Comment: 2 cans on workdays; 6-pk on days off; varies  ?   ?Social History  ? ?Substance and Sexual Activity  ?Drug Use Yes  ? Types: Cocaine, Marijuana, Heroin  ? Comment: Reports use 2 grams Cocaine 10/28/21  ?  ?Social History  ? ?Socioeconomic History  ? Marital status: Single  ?  Spouse name: Not on file  ? Number of children: 2  ? Years of education: GED  ? Highest education level: Not on file  ?Occupational History  ? Not on file  ?Tobacco Use  ? Smoking status: Every Day  ?  Packs/day: 1.00  ?  Years: 20.00  ?  Pack years: 20.00  ?  Types: Cigarettes  ? Smokeless tobacco: Never  ? Tobacco comments:  ?  Smoking .5 ppd  ?Substance and Sexual Activity  ? Alcohol use: Yes  ?  Alcohol/week: 2.0 standard drinks  ?  Types: 2 Cans of beer per week  ?  Comment: 2 cans on workdays; 6-pk on days off; varies  ? Drug use: Yes  ?  Types: Cocaine, Marijuana, Heroin  ?  Comment: Reports use  2 grams Cocaine 10/28/21  ? Sexual activity: Yes  ?Other Topics Concern  ? Not on file  ?Social History Narrative  ? Lives with mom.  ? Incarcerated for 22 months in [place], [place]. From 2014-09/2014  ? ?Social Determinants of Health  ? ?Financial Resource Strain: Not on file  ?Food Insecurity: Not on file  ?Transportation Needs: Not on file  ?Physical Activity: Not on file  ?Stress: Not on file  ?Social Connections: Not on file  ? ?                                   HOSPITAL COURSE: ?During the patient's hospitalization, patient had extensive initial psychiatric evaluation, and follow-up psychiatric evaluations every day.  Psychiatric diagnoses provided & medications started upon initial assessment were as follows: ?  ?-Risperdal dc'd  to as to maximize Thorazine as monotherapy ?-Change Thorazine from PRN to 50 mg TID for psychosis ?-Continue Depakote 750 mg BID for mood stabilization ?-Continue Gabapentin 400 mg TID for anxiety ?-Continue Cogentin 0.5 mg daily for EPS prophylaxis ?-Continue Hydroxyzine 25 mg TID PRN for anxiety ?-Continue Norvasc 5 mg daily for htn ?-Continue Insulin Novolog 0-5 units as per sliding scale nightly ?-Continue Losartan 50 mg daily for hypertension ?-Continue Metformin 500 mg daily with breakfast for diabetes ?-Continue Trazodone 100 mg nightly as needed for insomnia ?  ?During the hospitalization, other adjustments were made to the patient's psychiatric medication regimen with the final discharge medications being as follows:  ?-Risperdal dc'd to as to maximize Thorazine as monotherapy ?-Increased Thorazine to 75 mg BID & 150 mg nightly for psychosis  ?-Started  Zoloft 50 mg & increased to 100 mg daily for MDD ?-Continue Depakote 750 mg BID for mood stabilization ?-Continue Gabapentin 400 mg TID for anxiety ?-Continue Cogentin 0.5 mg daily for EPS prophylaxis ?-Continue Hydroxyzine 25 mg TID PRN for anxiety ?-Continue Norvasc 10 mg daily for htn ?-Continue Insulin Novolog 0-5 units as per sliding scale nightly ?-Continue Losartan 50 mg daily for hypertension ?-Continue Metformin 500 mg BID with breakfast for diabetes ?-Continue Trazodone 100 mg nightly as needed for insomnia ?  ?Patient's care was discussed during the interdisciplinary team meeting every day during the hospitalization. The patient denies having side effects to prescribed psychiatric medication. The patient was evaluated each day by a clinical provider to ascertain response to treatment. Improvement was noted by the patient's report of decreasing symptoms, improved sleep and appetite, affect, medication tolerance, behavior, and participation in unit programming.  Patient was asked each day to complete a self inventory noting mood, mental status,  pain, new symptoms, anxiety and concerns.  Symptoms were reported as significantly decreased or resolved completely by discharge. The patient reports that their mood is stable.  ?The patient denied having suicidal thoughts for more than 48 hours prior to discharge.  Patient denies having homicidal thoughts.  Patient denies having auditory hallucinations.  Patient denies any visual hallucinations or other symptoms of psychosis. The patient was motivated to continue taking medication with a goal of continued improvement in mental health.  ?  ?The patient reports their target psychiatric symptoms of depression & anxiety responded well to the psychiatric medications, and the patient reports overall benefit from this psychiatric hospitalization. Supportive psychotherapy was provided to the patient. The patient also participated in regular group therapy while hospitalized. Coping skills, problem solving as well as relaxation therapies were also part of the unit programming. ?  ?  Labs were reviewed with the patient, and abnormal results were discussed with the patient and he is agreeable to following up all abnormal labs and medical conditions with his primary care provider after discharge. ?  ?Physical Findings: ?AIMS: Facial and Oral Movements ?Muscles of Facial Expression: None, normal ?Lips and Perioral Area: None, normal ?Jaw: None, normal ?Tongue: None, normal,Extremity Movements ?Upper (arms, wrists, hands, fingers): None, normal ?Lower (legs, knees, ankles, toes): None, normal, Trunk Movements ?Neck, shoulders, hips: None, normal, Overall Severity ?Severity of abnormal movements (highest score from questions above): None, normal ?Incapacitation due to abnormal movements: None, normal ?Patient's awareness of abnormal movements (rate only patient's report): No Awareness, Dental Status ?Current problems with teeth and/or dentures?: No ?Does patient usually wear dentures?: No  ?CIWA:  CIWA-Ar Total: 4 ?COWS:  n/a ?AIMS:  0 ? ?Musculoskeletal: ?Strength & Muscle Tone: within normal limits ?Gait & Station: normal ?Patient leans: N/A ? ?Psychiatric Specialty Exam: ? ?Presentation  ?General Appearance: Appropriate for [place]viron[place]

## 2021-12-21 ENCOUNTER — Other Ambulatory Visit: Payer: Self-pay

## 2022-01-08 ENCOUNTER — Ambulatory Visit (HOSPITAL_COMMUNITY)
Admission: EM | Admit: 2022-01-08 | Discharge: 2022-01-09 | Disposition: A | Discharge status: Home / Self Care | Payer: 59 | Attending: Urology | Admitting: Urology

## 2022-01-08 DIAGNOSIS — Z20822 Contact with and (suspected) exposure to covid-19: Secondary | ICD-10-CM | POA: Insufficient documentation

## 2022-01-08 DIAGNOSIS — F1994 Other psychoactive substance use, unspecified with psychoactive substance-induced mood disorder: Secondary | ICD-10-CM | POA: Diagnosis not present

## 2022-01-08 DIAGNOSIS — R45851 Suicidal ideations: Secondary | ICD-10-CM | POA: Insufficient documentation

## 2022-01-08 DIAGNOSIS — R45850 Homicidal ideations: Secondary | ICD-10-CM | POA: Insufficient documentation

## 2022-01-08 MED ORDER — SERTRALINE HCL 100 MG PO TABS
100.0000 mg | ORAL_TABLET | Freq: Every day | ORAL | Status: DC
  Administered 2022-01-09: 100 mg via ORAL
  Filled 2022-01-08: qty 1

## 2022-01-08 MED ORDER — CHLORPROMAZINE HCL 50 MG PO TABS
150.0000 mg | ORAL_TABLET | Freq: Every day | ORAL | Status: DC
  Administered 2022-01-09: 150 mg via ORAL
  Filled 2022-01-08: qty 3

## 2022-01-08 MED ORDER — CHLORPROMAZINE HCL 50 MG PO TABS: 75.0000 mg | ORAL_TABLET | Freq: Two times a day (BID) | ORAL | Status: DC

## 2022-01-08 MED ORDER — CHLORPROMAZINE HCL 50 MG PO TABS
75.0000 mg | ORAL_TABLET | Freq: Two times a day (BID) | ORAL | Status: DC
  Administered 2022-01-09 (×2): 75 mg via ORAL
  Filled 2022-01-08 (×2): qty 1

## 2022-01-08 MED ORDER — AMLODIPINE BESYLATE 10 MG PO TABS
10.0000 mg | ORAL_TABLET | Freq: Every day | ORAL | Status: DC
  Administered 2022-01-09: 10 mg via ORAL
  Filled 2022-01-08: qty 1

## 2022-01-08 MED ORDER — GABAPENTIN 400 MG PO CAPS
400.0000 mg | ORAL_CAPSULE | Freq: Three times a day (TID) | ORAL | Status: DC
  Administered 2022-01-09 (×3): 400 mg via ORAL
  Filled 2022-01-08 (×3): qty 1

## 2022-01-08 MED ORDER — HYDROXYZINE HCL 25 MG PO TABS: 25.0000 mg | ORAL_TABLET | Freq: Four times a day (QID) | ORAL | Status: DC | PRN

## 2022-01-08 MED ORDER — ALUM & MAG HYDROXIDE-SIMETH 200-200-20 MG/5ML PO SUSP: 30.0000 mL | ORAL | Status: DC | PRN

## 2022-01-08 MED ORDER — MAGNESIUM HYDROXIDE 400 MG/5ML PO SUSP: 30.0000 mL | Freq: Every day | ORAL | Status: DC | PRN

## 2022-01-08 MED ORDER — ACETAMINOPHEN 325 MG PO TABS: 650.0000 mg | ORAL_TABLET | Freq: Four times a day (QID) | ORAL | Status: DC | PRN

## 2022-01-08 MED ORDER — TRAZODONE HCL 100 MG PO TABS
100.0000 mg | ORAL_TABLET | Freq: Every evening | ORAL | Status: DC | PRN
  Administered 2022-01-09: 100 mg via ORAL
  Filled 2022-01-08: qty 1

## 2022-01-08 MED ORDER — DIVALPROEX SODIUM 500 MG PO DR TAB
750.0000 mg | DELAYED_RELEASE_TABLET | Freq: Two times a day (BID) | ORAL | Status: DC
  Administered 2022-01-09 (×2): 750 mg via ORAL
  Filled 2022-01-08 (×2): qty 1

## 2022-01-08 MED ORDER — METFORMIN HCL 500 MG PO TABS
500.0000 mg | ORAL_TABLET | Freq: Two times a day (BID) | ORAL | Status: DC
  Administered 2022-01-09 (×2): 500 mg via ORAL
  Filled 2022-01-08 (×2): qty 1

## 2022-01-08 MED ORDER — BENZTROPINE MESYLATE 0.5 MG PO TABS
0.5000 mg | ORAL_TABLET | Freq: Two times a day (BID) | ORAL | Status: DC
  Administered 2022-01-09 (×2): 0.5 mg via ORAL
  Filled 2022-01-08 (×2): qty 1

## 2022-01-08 NOTE — ED Triage Notes (Signed)
Pt reports to [facility] voluntarily accompanied by [employer]. Pt initially stated he flagged down [employer] because he wanted help to get to [facility], but later stated that a friend called. Pt observed with abrupt movement and difficulty following triage process. Pt reports feeling suicidal with a plan to walk into traffic. Pt also reports having thoughts of wanting to hurt others, but with no current plan. Pt reports using cocaine and crystal meth today. Pt reports experiences visual hallucinations and sees images of animals and people. Pt stated that he is prescribed Thorozine, but does not remember dosage and frequency. Pt is emergent. ?

## 2022-01-08 NOTE — ED Provider Notes (Signed)
[facility] Continuous Assessment Admission H&P ? ?Date: 01/08/22 ?Patient Name: Duane Salazar ?MRN: [id number] ?Chief Complaint:  ?Chief Complaint  ?Patient presents with  ? Addiction Problem  ? Suicidal  ?   ? ?Diagnoses:  ?Final diagnoses:  ?Substance induced mood disorder ([place])  ?Suicidal ideation  ?Homicidal ideations  ? ? ?HPI: Duane Salazar is a 46 year old male with psychiatric history of depression, schizoaffective disorder, polysubstance abuse (cocaine, crystal meth, cannabis), and suicidal attempts.  Patient presented voluntarily to [facility] via Event organiser.  Patient presented with chief complaint of visual hallucination, suicidal and homicidal ideation. ? ?This provider met with patient face-to-face and reviewed his chart.  On assessment, patient is alert and oriented x4. Patient's speech is garbled; he is able to maintain good eye contact. His thought process is coherent. His mood is depressed with congruent affect.  he did not appear to to be responding to any internal or external stimuli or experiencing any delusional thought content during assessment. ? ?Patient reported that he is feeling suicidal with plan to walk into traffic. Patient also reports that he is having "thoughts of getting violent towards others and killing myself." Patient denies homicidal plans/intent/target. He admits that he is upset towards his "baby mama" for refusing to give him a ride.he denies homicidal ideation towards her. He is unable to identify any recent stressor. ?He endorses visual hallucination of "seeing images of people and animal." He denies auditory hallucination. He says he used 10 grams of crystal meth in the past 1 week, he last used meth today. He says he has not slept in 7 days. He says he lives at home with his mother.  ? ?Patient gave verbal consent to contact his mother Jary Louvier [phone]. Multiple attempts to reach Ms Ellard Artis were unsuccessful.    ? ?PHQ 2-9:  ?Flowsheet Row ED from 11/26/2021 in  [facility] Video Visit from 10/01/2021 in [facility] Office Visit from 09/04/2021 in [facility]  ?Thoughts that you would be better off dead, or of hurting yourself in some way Several days Several days Not at all  ?PHQ-9 Total Score _0 ? ?  ?  ?Flowsheet Row Admission (Discharged) from 12/10/2021 in [facility] ED from 12/08/2021 in [facility]PT ED from 11/26/2021 in [facility]  ?C-SSRS RISK CATEGORY High Risk High Risk High Risk  ? ?  ?  ? ?Total Time spent with patient: 15 minutes ? ?Musculoskeletal  ?Strength & Muscle Tone: within normal limits ?Gait & Station: normal ?Patient leans: Right ? ?Psychiatric Specialty Exam  ?Presentation ?General Appearance: Disheveled ? ?Eye Contact:Good ? ?Speech:Garbled ? ?Speech Volume:Normal ? ?Handedness:Right ? ? ?Mood and Affect  ?Mood:Anxious ? ?Affect:Congruent ? ? ?Thought Process  ?Thought Processes:Coherent ? ?Descriptions of Associations:Intact ? ?Orientation:Full (Time, Place and Person) ? ?Thought Content:WDL ? Diagnosis of Schizophrenia or Schizoaffective disorder in past: Yes ? Duration of Psychotic Symptoms: Greater than six months ? ?Hallucinations:Hallucinations: Visual ? ?Ideas of Reference:None ? ?Suicidal Thoughts:Suicidal Thoughts: Yes, Active ? ?Homicidal Thoughts:Homicidal Thoughts: Yes, Passive ? ? ?Sensorium  ?Memory:Immediate Good; Recent Good; Remote Good ? ?Judgment:Good ? ?Insight:Good ? ? ?Executive Functions  ?Concentration:Good ? ?Attention Span:Good ? ?Recall:Good ? ?Fund of [place] ? ?Language:Good ? ? ?Psychomotor Activity  ?Psychomotor Activity:Psychomotor Activity: Normal ? ? ?Assets  ?Assets:Communication Skills; Desire for Improvement; Transportation; Social Support ? ? ?Sleep  ?Sleep:Sleep: Poor (pt  reports no sleep in 7 days) ? ? ?Nutritional  Assessment (For OBS and FBC admissions only) ?Has the patient had a weight loss or gain of 10 pounds or more in the last 3 months?: No ?Has the patient had a decrease in food intake/or appetite?: No ?Does the patient have dental problems?: No ?Does the patient have eating habits or behaviors that may be indicators of an eating disorder including binging or inducing vomiting?: No ?Has the patient recently lost weight without trying?: 0 ?Has the patient been eating poorly because of a decreased appetite?: 0 ?Malnutrition Screening Tool Score: 0 ? ? ? ?Physical Exam ?Vitals and nursing note reviewed.  ?Constitutional:   ?   General: He is not in acute distress. ?   Appearance: He is well-developed.  ?HENT:  ?   Head: Normocephalic and atraumatic.  ?Eyes:  ?   Conjunctiva/sclera: Conjunctivae normal.  ?Cardiovascular:  ?   Rate and Rhythm: Normal rate.  ?Pulmonary:  ?   Effort: Pulmonary effort is normal. No respiratory distress.  ?   Breath sounds: Normal breath sounds.  ?Abdominal:  ?   Palpations: Abdomen is soft.  ?   Tenderness: There is no abdominal tenderness.  ?Musculoskeletal:     ?   General: No swelling.  ?   Cervical back: Neck supple.  ?Skin: ?   General: Skin is warm and dry.  ?Neurological:  ?   Mental Status: He is alert and oriented to person, place, and time.  ?Psychiatric:     ?   Mood and Affect: Mood normal.  ? ?Review of Systems  ?Constitutional: Negative.  Negative for chills and fever.  ?HENT: Negative.    ?Eyes: Negative.   ?Respiratory: Negative.    ?Cardiovascular: Negative.  Negative for chest pain and palpitations.  ?Gastrointestinal: Negative.   ?Genitourinary: Negative.   ?Musculoskeletal: Negative.   ?Skin: Negative.   ?Neurological: Negative.   ?Endo/Heme/Allergies: Negative.   ?Psychiatric/Behavioral:  Positive for hallucinations, substance abuse and suicidal ideas.   ? ?Blood pressure (!) 152/114, pulse 91, temperature 98.2 ?F (36.8 ?C), temperature source Oral, resp. rate 20, SpO2 96  %. There is no height or weight on file to calculate BMI. ? ?Past Psychiatric History:  depression, schizoaffective disorder, polysubstance abuse (cocaine, crystal meth, cannabis), and suicidal attempts. ? ?Is the patient at risk to self? Yes  ?Has the patient been a risk to self in the past 6 months? Yes .    ?Has the patient been a risk to self within the distant past? Yes   ?Is the patient a risk to others? Yes   ?Has the patient been a risk to others in the past 6 months? No   ?Has the patient been a risk to others within the distant past? No  ? ?Past Medical History:  ?Past Medical History:  ?Diagnosis Date  ? 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[place]) 2004  ? AKI (acute kidney injury) ([place]) 03/21/2015  ? Arthritis Dx 2002  ? Diabetes type 2, controlled ([place]) 2010  ? Drug abuse ([place])   ? Headache   ? Hypertension Dx 2002  ? 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 Schizo affective schizophrenia ([place]) 1995  ? Schizophrenia ([place])   ? Seizures ([place])   ? Sleep apnea 1995  ? on CPAP  ?  ?Past Surgical History:  ?Procedure Laterality Date  ? PITUITARY SURGERY  2005 & 2012  ? SKIN BIOPSY    ? ? ?Family History:  ?Family History  ?Problem Relation Age of Onset  ?  Hypertension Mother   ? Diabetes Mother   ? Heart Problems Mother   ? Cancer Maternal Uncle   ? Alcoholism Maternal Uncle   ? Cancer Maternal Grandmother   ? Heart disease Neg Hx   ? ? ?Social History:  ?Social History  ? ?Socioeconomic History  ? Marital status: Single  ?  Spouse name: Not on file  ? Number of children: 2  ? Years of education: GED  ? Highest education level: Not on file  ?Occupational History  ? Not on file  ?Tobacco Use  ? Smoking status: Every Day  ?  Packs/day: 1.00  ?  Years: 20.00  ?  Pack years: 20.00  ?  Types: Cigarettes  ? Smokeless tobacco: Never  ? Tobacco comments:  ?  Smoking .5 ppd  ?Substance and Sexual Activity  ? Alcohol use: Yes  ?  Alcohol/week: 2.0 standard drinks  ?  Types: 2 Cans of beer per week  ?  Comment: 2 cans on workdays; 6-pk on days off;  varies  ? Drug use: Yes  ?  Types: Cocaine, Marijuana, Heroin  ?  Comment: Reports use 2 grams Cocaine 10/28/21  ? Sexual activity: Yes  ?Other Topics Concern  ? Not on file  ?Social History Narrative  ? Lives wi

## 2022-01-09 DIAGNOSIS — R45850 Homicidal ideations: Secondary | ICD-10-CM

## 2022-01-09 DIAGNOSIS — Z20822 Contact with and (suspected) exposure to covid-19: Secondary | ICD-10-CM | POA: Diagnosis not present

## 2022-01-09 DIAGNOSIS — R45851 Suicidal ideations: Secondary | ICD-10-CM | POA: Diagnosis not present

## 2022-01-09 DIAGNOSIS — F1994 Other psychoactive substance use, unspecified with psychoactive substance-induced mood disorder: Secondary | ICD-10-CM | POA: Diagnosis present

## 2022-01-09 LAB — COMPREHENSIVE METABOLIC PANEL
ALT: 42 U/L (ref 0–44)
AST: 61 U/L — ABNORMAL HIGH (ref 15–41)
Albumin: 4 g/dL (ref 3.5–5.0)
Alkaline Phosphatase: 53 U/L (ref 38–126)
Anion gap: 9 (ref 5–15)
BUN: 20 mg/dL (ref 6–20)
CO2: 21 mmol/L — ABNORMAL LOW (ref 22–32)
Calcium: 9 mg/dL (ref 8.9–10.3)
Chloride: 104 mmol/L (ref 98–111)
Creatinine, Ser: 0.99 mg/dL (ref 0.61–1.24)
GFR, Estimated: >60 mL/min (ref >60)
Glucose, Bld: 201 mg/dL — ABNORMAL HIGH (ref 70–99)
Potassium: 3.4 mmol/L — ABNORMAL LOW (ref 3.5–5.1)
Sodium: 134 mmol/L — ABNORMAL LOW (ref 135–145)
Total Bilirubin: 1.9 mg/dL — ABNORMAL HIGH (ref 0.3–1.2)
Total Protein: 7.4 g/dL (ref 6.5–8.1)

## 2022-01-09 LAB — CBC WITH DIFFERENTIAL/PLATELET
Abs Immature Granulocytes: 0.02 10*3/uL (ref 0.00–0.07)
Basophils Absolute: 0.1 10*3/uL (ref 0.0–0.1)
Basophils Relative: 1 %
Eosinophils Absolute: 0.7 10*3/uL — ABNORMAL HIGH (ref 0.0–0.5)
Eosinophils Relative: 7 %
HCT: 42.1 % (ref 39.0–52.0)
Hemoglobin: 13.2 g/dL (ref 13.0–17.0)
Immature Granulocytes: 0 %
Lymphocytes Relative: 33 %
Lymphs Abs: 3 10*3/uL (ref 0.7–4.0)
MCH: 23.5 pg — ABNORMAL LOW (ref 26.0–34.0)
MCHC: 31.4 g/dL (ref 30.0–36.0)
MCV: 74.9 fL — ABNORMAL LOW (ref 80.0–100.0)
Monocytes Absolute: 0.9 10*3/uL (ref 0.1–1.0)
Monocytes Relative: 10 %
Neutro Abs: 4.4 10*3/uL (ref 1.7–7.7)
Neutrophils Relative %: 49 %
Platelets: 273 10*3/uL (ref 150–400)
RBC: 5.62 MIL/uL (ref 4.22–5.81)
RDW: 16.7 % — ABNORMAL HIGH (ref 11.5–15.5)
WBC: 9.1 10*3/uL (ref 4.0–10.5)
nRBC: 0 % (ref 0.0–0.2)

## 2022-01-09 LAB — ETHANOL: Alcohol, Ethyl (B): <10 mg/dL (ref <10)

## 2022-01-09 LAB — LIPID PANEL
Cholesterol: 126 mg/dL (ref 0–200)
HDL: 52 mg/dL (ref >40)
LDL Cholesterol: 65 mg/dL (ref 0–99)
Total CHOL/HDL Ratio: 2.4 RATIO
Triglycerides: 45 mg/dL (ref <150)
VLDL: 9 mg/dL (ref 0–40)

## 2022-01-09 LAB — RESP PANEL BY RT-PCR (FLU A&B, COVID) ARPGX2
Influenza A by PCR: NEGATIVE
Influenza B by PCR: NEGATIVE
SARS Coronavirus 2 by RT PCR: NEGATIVE

## 2022-01-09 LAB — TSH: TSH: 0.329 u[IU]/mL — ABNORMAL LOW (ref 0.350–4.500)

## 2022-01-09 NOTE — ED Notes (Signed)
Patient discharging at this time. Patient currently denies SI,HI, and A/V/H. Patient denies any pain. Alert and vs stable. Printed AVS given and reviewed with patient along with resources and bus pass provided. All belongings returned to patient. No s/s of current distress.  ?

## 2022-01-09 NOTE — ED Notes (Signed)
Pt is in the bed sleeping. Respirations are even and unlabored. No acute distress noted. Will continue to monitor for safety. 

## 2022-01-09 NOTE — ED Notes (Signed)
Patient med compliant and sleeping. Cooperative/calm at this moment.  ?

## 2022-01-09 NOTE — BH Assessment (Signed)
Comprehensive Clinical Assessment (CCA) Note ? ?01/09/2022 ?Duane Salazar ?[id number] ? ?Discharge Disposition: ?Leandro Reasoner, NP, reviewed pt's chart and information and met with pt face-to-face and determined pt should receive continuous assessment overnight and be re-assessed by psychiatry in the morning. Pt has been accepted to the [facility]. ? ?The patient demonstrates the following risk factors for suicide: Chronic risk factors for suicide include: psychiatric disorder of Schizoaffective Disorder and substance use disorder. Acute risk factors for suicide include: family or marital conflict and loss (financial, interpersonal, professional). Protective factors for this patient include: positive social support and hope for the future. Considering these factors, the overall suicide risk at this point appears to be high. Patient is not appropriate for outpatient follow up. ? ?Therefore, a 1:1 sitter is recommended for suicide precautions. ? ?[facility] ED from 01/08/2022 in [facility] Admission (Discharged) from 12/10/2021 in [facility] ED from 12/08/2021 in [facility] DEPT  ?C-SSRS RISK CATEGORY High Risk High Risk High Risk  ? ?  ?Chief Complaint:  ?Chief Complaint  ?Patient presents with  ? Addiction Problem  ? Suicidal  ? ?Visit Diagnosis: Schizoaffective Disorder, Depressive Type; Amphetamine Abuse ? ?CCA Screening, Triage and Referral (STR) ?Duane Salazar is a 46 year old patient who was brought to the [facility] after the police were called to pick him up; pt states it was his "baby momma" that called them after he called her asking for a ride. Pt observed with abrupt movement and difficulty following triage process. Pt reports feeling suicidal with a plan to walk into traffic. Pt also reports having thoughts of wanting to hurt others, but with no current plan.  ? ?Pt reports using cocaine and crystal meth today; he states he used  10 grams of methamphetamine over the last week and that he has not slept in 7 days. Pt reports experiencing visual hallucinations and sees images of animals and people. Pt stated that he is prescribed Thorozine, but does not remember dosage and frequency.  ? ?Pt's reporting is difficult to obtain as his language is garbled. ? ?Pt's orientation is UTA. His recent/remote memory is intact. Pt was cooperative throughout the assessment process. Pt's insight, judgement, and impulse control is poor at this time. ? ?Patient Reported Information ?How did you hear about [place]? Legal System ? ?What Is the Reason for Your Visit/Call Today? Pt reports to [facility] voluntarily accompanied by [employer]. Pt initially stated he flagged down [employer] because he wanted help to get to [facility], but later stated that a friend called. Pt observed with abrupt movement and difficulty following triage process. Pt reports feeling suicidal with a plan to walk into traffic. Pt also reports having thoughts of wanting to hurt others, but with no current plan. Pt reports using cocaine and crystal meth today. Pt reports experiences visual hallucinations and sees images of animals and people. Pt stated that he is prescribed Thorozine, but does not remember dosage and frequency. Pt is emergent. ? ?How Long Has This Been Causing You Problems? > than 6 months ? ?What Do You Feel Would Help You the Most Today? Treatment for Depression or other mood problem; Alcohol or Drug Use Treatment ? ? ?Have You Recently Had Any Thoughts About Hurting Yourself? Yes ? ?Are You Planning to Commit Suicide/Harm Yourself At This time? Yes ? ? ?Have you Recently Had Thoughts About [place]? Yes ? ?Are You Planning to Harm Someone at This Time? Yes ? ?Explanation: no plan ? ? ?  Have You Used Any Alcohol or Drugs in the Past 24 Hours? Yes ? ?How Long Ago Did You Use Drugs or Alcohol? No data recorded ?What Did You Use and How Much? cocaine and crystal meth (unsure of how  much) ? ? ?Do You Currently Have a Therapist/Psychiatrist? -- (UTA) ? ?Name of Therapist/Psychiatrist: Patient is linked with Duane Salazar at [facility]. However, has a hx of non compliance. ? ? ?Have You Been Recently Discharged From Any Office Practice or Programs? -- (UTA) ? ?Explanation of Discharge From Practice/Program: [facility] [place], discharged last month after just leaving the premises  and never returning. ? ? ?  ?CCA Screening Triage Referral Assessment ?Type of Contact: Face-to-Face ? ?Telemedicine Service Delivery:   ?Is this Initial or Reassessment? Initial Assessment ? ?Date Telepsych consult ordered in [facility]:  12/08/21 ? ?Time Telepsych consult ordered in [facility]:  1158 ? ?Location of Assessment: [facility] ? ?Provider Location: [facility] ? ? ?Collateral Involvement: Pt provided verbal consent for clinician and NP to make contact with his mother, Duane Salazar, but none of the 3 phone calls placed were answered and the voice mailbox was not set up. ? ? ?Does Patient Have a Court Appointed Legal Guardian? No data recorded ?Name and Contact of Legal Guardian: No data recorded ?If Minor and Not Living with Parent(s), Who has Custody? N/A ? ?Is CPS involved or ever been involved? -- (UTA) ? ?Is APS involved or ever been involved? -- (UTA) ? ? ?Patient Determined To Be At Risk for Harm To Self or Others Based on Review of Patient Reported Information or Presenting Complaint? Yes, for Self-Harm ? ?Method: No data recorded ?Availability of Means: No data recorded ?Intent: No data recorded ?Notification Required: No data recorded ?Additional Information for Danger to Others Potential: No data recorded ?Additional Comments for Danger to Others Potential: No data recorded ?Are There Guns or Other Weapons in Your Home? No data recorded ?Types of Guns/Weapons: No data recorded ?Are These Weapons Safely Secured?                            No data recorded ?Who Could Verify You Are  Able To Have These Secured: No data recorded ?Do You Have any Outstanding Charges, Pending Court Dates, Parole/Probation? No data recorded ?Contacted To Inform of Risk of Harm To Self or Others: Unable to Contact: (Pt provided verbal consent for clinician and NP to make contact with his mother, Duane Salazar, but none of the 3 phone calls placed were answered and the voice mailbox was not set up.) ? ? ? ?Does Patient Present under Involuntary Commitment? No ? ?IVC Papers Initial File Date: No data recorded ? ?County of Residence: [place] ? ? ?Patient Currently Receiving the Following Services: -- (UTA) ? ? ?Determination of Need: Urgent (48 hours) ? ? ?Options For Referral: [facility] Urgent Care; Facility-Based Crisis; Medication Management; Chemical Dependency Intensive Outpatient Therapy (CDIOP); Outpatient Therapy ? ? ? ? ?CCA Biopsychosocial ?Patient Reported Schizophrenia/Schizoaffective Diagnosis in Past: Yes ? ? ?Strengths: Pt states he cares for his mother and that he is currently residing with her. ? ? ?Mental Health Symptoms ?Depression:   ?Irritability; Hopelessness; Worthlessness; Fatigue; Difficulty Concentrating; Sleep (too much or little); Change in energy/activity ?  ?Duration of Depressive symptoms:  ?Duration of Depressive Symptoms: Greater than two weeks ?  ?Mania:   ?None ?  ?Anxiety:    ?Worrying; Tension ?  ?Psychosis:   ?  Hallucinations ?  ?Duration of Psychotic symptoms:  ?Duration of Psychotic Symptoms: Greater than six months ?  ?Trauma:   ?None ?  ?Obsessions:   ?None ?  ?Compulsions:   ?N/A ?  ?Inattention:   ?Disorganized; Forgetful; Loses things ?  ?Hyperactivity/Impulsivity:   ?Feeling of restlessness ?  ?Oppositional/Defiant Behaviors:   ?Angry; Argumentative ?  ?Emotional Irregularity:   ?Recurrent suicidal behaviors/gestures/threats; Potentially harmful impulsivity ?  ?Other Mood/Personality Symptoms:   ?None noted ?  ? ?Mental Status Exam ?Appearance and self-care  ?Stature:   ?Tall ?   ?Weight:   ?Average weight ?  ?Clothing:   ?Disheveled ?  ?Grooming:   ?Neglected ?  ?Cosmetic use:   ?None ?  ?Posture/gait:   ?Slumped ?  ?Motor activity:   ?Agitated ?  ?Sensorium  ?Attention:   ?Normal ?  ?[place]

## 2022-01-09 NOTE — Progress Notes (Signed)
Substance Abuse Resources ? ? ?[facility] ?- Admissions are currently completed Monday through Friday at [facility]; both appointments and walk-ins are accepted.  Any individual that is a [facility] resident may present for a substance abuse screening and assessment for admission.  A person may be referred by numerous sources or self-refer.   Potential clients will be screened for medical necessity and appropriateness for the program.  Clients must meet criteria for high-intensity residential treatment services.  If clinically appropriate, a client will continue with the comprehensive clinical assessment and intake process, as well as enrollment in the [facility]. ? ?Address: [address] ?[place], [place] [address] ?Admin Hours: Mon-Fri 8AM to [place] Hours: 24/7 ?Phone: [phone] ?Fax: [phone] ? ?[facility] (Detox) Facility Based Crisis:  ?These are 3 locations for services: Please call before arrival:   ? ?[facility])  ?Address: [address] [place], [place] [address] ?Phone: [phone] ? ?[facility]) ?Address: [address] [place], [place] [address] ?Phone#: [phone] ? ?[facility]) ?Address: [address] [place], [place], [place] [address] ?Phone#: [phone] ? ? ?[facility]): (offers outpatient therapy and intensive outpatient substance abuse therapy).  ?[address] [place], [place] [address] ?Phone: [phone] ? ?[facility]: ? ? Phone: [phone] ? ?[facility] ?SA Intensive Outpatient Program (SAIOP) means structured individual and group addiction activities and services that are provided at an outpatient program designed to assist adult and adolescent consumers to begin recovery and learn skills for recovery maintenance. The [facility] program is offered at  least 3 hours a day, 3 days a week. SAIOP services shall include a structured program consisting of, but not limited to, the following services: ?Individual counseling and support; Group counseling and support; Family counseling, training or support; Biochemical assays to identify recent drug use (e.g., urine drug screens); Strategies for relapse prevention to include community and social support systems in treatment; Life skills; Crisis contingency planning; Disease Management; and Treatment support activities that have been adapted or specifically designed for persons with physical disabilities, or persons with co-occurring disorders of mental illness and substance abuse/dependence or mental retardation/developmental disability and substance abuse/dependence. ? ?Phone: [phone]  ? ?[facility]) ? ?Address: [place], [place], [place] [address] ?Phone: [phone] ? ? ?[facility] ?Address: [address], [place], [place] [address] ?Phone: [phone] ? ?- a combination of group and individual sessions to meet the participants needs. This allows participants to engage in treatment and remain involved in their home and work life. ?- Transitional housing places program participants in a supportive living environment while they complete a treatment program and work to secure independent housing. ?- The Substance Abuse Intensive Outpatient Treatment Program at [facility] consists of structured group sessions and individual sessions that are designed to teach participants early recovery and relapse prevention skills. ?-[facility] works with the [employer] to provide a housing and treatment program for homeless veterans.  ? ?[facility].  ? ?Address: [address] [place], [place] [address] ?Phone#: [phone]  ? ?: Referrals to RTSA facilities can be made by [facility] and [facility].   Referrals are also accepted from physicians, private providers, hospital emergency rooms, family members, or any person who has knowledge of someone in the need of our services. ? ?  The [facility] will also offer the following outpatient services: (Monday through Friday 8am-5pm) ?  ?Partial Hospitalization Program (PHP) ?Substance Abuse Intensive Outpatient Program (SA-IOP) ?Group Therapy ?Medication Management ?Peer Living Room ?We also provide (24/7):  ?Assessments: Our mental health clinician and providers will conduct a focused mental health evaluation, assessing for immediate safety concerns and further mental health needs. ?Referral: Our team will provide resources and help connect to community based mental health treatment, when indicated, including psychotherapy, psychiatry, and other specialized behavioral health or substance use disorder services (for those not already in treatment). ?Transitional Care: Our team providers in person bridging and/or telephonic follow-up during the patient's transition to outpatient services.  ? ?[facility] ?24-Hour Call Center: ?[phone] ?Behavioral Health Crisis Line: ?[phone] ? ?[place]ie [place], MSW, LCSW-A, LCAS ?Phone: [phone] ?Disposition/TOC ? ?

## 2022-01-09 NOTE — ED Notes (Signed)
Patient cooperative and med compliant. Patient ate breakfast and states still feeling depressed but denies SI,HI, and A/V/H with no plan/intent. Patient denies pain and expressed no further questions/concerns at this moment. No aggressive/violent behavior.  ?

## 2022-01-09 NOTE — ED Provider Notes (Signed)
FBC/OBS ASAP Discharge Summary ? ?Date and Time: 01/09/2022 5:17 PM  ?Name: Duane Salazar  ?MRN:  [id number]  ? ?Discharge Diagnoses:  ?Final diagnoses:  ?Substance induced mood disorder ([place])  ?Suicidal ideation  ?Homicidal ideations  ? ? ?Subjective: Duane Salazar is a 46 year old African-American male well-known to this service seeking additional outpatient resources for substance abuse.  NP spoke to Duane Salazar for possible treatment facility programs and additional resources. Patient to be discharged, and follow-up with resources provided by CSW.  Patient was recently discharged from inpatient admission, however states he was not able to follow-up with discharge scheduled appointments.  States he went back to using cocaine and methamphetamines. ? ?During evaluation Duane Salazar is walking with right-side limp, in no acute distress.  He is alert/oriented x 4; calm/cooperative; and mood congruent with affect. He is speaking in a clear tone . However can be difficult to understand at times. Patient has remained calm throughout assessment and has answered questions appropriately.   ? ?At this time Duane Salazar is educated and verbalizes understanding of mental health resources and other crisis services in the community. He is instructed to call 911 and present to the nearest emergency room should he experience any suicidal/homicidal ideation, auditory/visual/hallucinations, or detrimental worsening of his mental health condition. He was a also advised by Probation officer that he could call the toll-free phone on insurance card to assist with identifying in network counselors and agencies or number on back of Medicaid card to speak with care coordinator.  ?  ? ?Per admission assessment note:Duane Salazar is a 46 year old male with psychiatric history of depression, schizoaffective disorder, polysubstance abuse (cocaine, crystal meth, cannabis), and suicidal attempts.  Patient presented voluntarily to [facility] via Event organiser.   Patient presented with chief complaint of visual hallucination, suicidal and homicidal ideation.This provider met with patient face-to-face and reviewed his chart.  On assessment, patient is alert and oriented x4. Patient's speech is garbled; he is able to maintain good eye contact. " ?Stay Summary:  ? ?Total Time spent with patient: 15 minutes ? ?Past Psychiatric History: ?Past Medical History:  ?Past Medical History:  ?Diagnosis Date  ? 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 AKI (acute kidney injury) ([place]) 03/21/2015  ? Arthritis Dx 2002  ? Diabetes type 2, controlled ([place]) 2010  ? Drug abuse ([place])   ? Headache   ? Hypertension Dx 2002  ? 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 Schizo affective schizophrenia ([place]) 1995  ? Schizophrenia ([place])   ? Seizures ([place])   ? Sleep apnea 1995  ? on CPAP  ?  ?Past Surgical History:  ?Procedure Laterality Date  ? PITUITARY SURGERY  2005 & 2012  ? SKIN BIOPSY    ? ?Family History:  ?Family History  ?Problem Relation Age of Onset  ? Hypertension Mother   ? Diabetes Mother   ? Heart Problems Mother   ? Cancer Maternal Uncle   ? Alcoholism Maternal Uncle   ? Cancer Maternal Grandmother   ? Heart disease Neg Hx   ? ?Family Psychiatric History:  ?Social History:  ?Social History  ? ?Substance and Sexual Activity  ?Alcohol Use Yes  ? Alcohol/week: 2.0 standard drinks  ? Types: 2 Cans of beer per week  ? Comment: 2 cans on workdays; 6-pk on days off; varies  ?   ?Social History  ? ?Substance and Sexual Activity  ?Drug Use Yes  ? Types: Cocaine, Marijuana, Heroin  ? Comment: Reports use 2 grams Cocaine 10/28/21  ?  ?  Social History  ? ?Socioeconomic History  ? Marital status: Single  ?  Spouse name: Not on file  ? Number of children: 2  ? Years of education: GED  ? Highest education level: Not on file  ?Occupational History  ? Not on file  ?Tobacco Use  ? Smoking status: Every Day  ?  Packs/day: 1.00  ?  Years: 20.00  ?  Pack years: 20.00  ?  Types: Cigarettes  ? Smokeless tobacco: Never  ? Tobacco  comments:  ?  Smoking .5 ppd  ?Substance and Sexual Activity  ? Alcohol use: Yes  ?  Alcohol/week: 2.0 standard drinks  ?  Types: 2 Cans of beer per week  ?  Comment: 2 cans on workdays; 6-pk on days off; varies  ? Drug use: Yes  ?  Types: Cocaine, Marijuana, Heroin  ?  Comment: Reports use 2 grams Cocaine 10/28/21  ? Sexual activity: Yes  ?Other Topics Concern  ? Not on file  ?Social History Narrative  ? Lives with mom.  ? Incarcerated for 22 months in [place], [place]. From 2014-09/2014  ? ?Social Determinants of Health  ? ?Financial Resource Strain: Not on file  ?Food Insecurity: Not on file  ?Transportation Needs: Not on file  ?Physical Activity: Not on file  ?Stress: Not on file  ?Social Connections: Not on file  ? ?SDOH:  ?SDOH Screenings  ? ?Alcohol Screen: Medium Risk  ? Last Alcohol Screening Score (AUDIT): 9  ?Depression (PHQ2-9): Medium Risk  ? PHQ-2 Score: 17  ?Financial Resource Strain: Not on file  ?Food Insecurity: Not on file  ?Housing: Not on file  ?Physical Activity: Not on file  ?Social Connections: Not on file  ?Stress: Not on file  ?Tobacco Use: High Risk  ? Smoking Tobacco Use: Every Day  ? Smokeless Tobacco Use: Never  ? Passive Exposure: Not on file  ?Transportation Needs: Not on file  ? ? ?Tobacco Cessation:  N/A, patient does not currently use tobacco products ? ?Current Medications:  ?Current Facility-Administered Medications  ?Medication Dose Route Frequency Provider Last Rate Last Admin  ? acetaminophen (TYLENOL) tablet 650 mg  650 mg Oral Q6H PRN Ajibola, Ene A, NP      ? alum & mag hydroxide-simeth (MAALOX/MYLANTA) 200-200-20 MG/5ML suspension 30 mL  30 mL Oral Q4H PRN Ajibola, Ene A, NP      ? amLODipine (NORVASC) tablet 10 mg  10 mg Oral Daily Ajibola, Ene A, NP   10 mg at 01/09/22 0901  ? benztropine (COGENTIN) tablet 0.5 mg  0.5 mg Oral BID Ajibola, Ene A, NP   0.5 mg at 01/09/22 0903  ? chlorproMAZINE (THORAZINE) tablet 150 mg  150 mg Oral QHS Ajibola, Ene A, NP   150 mg at 01/09/22  0054  ? chlorproMAZINE (THORAZINE) tablet 75 mg  75 mg Oral BID Ajibola, Ene A, NP   75 mg at 01/09/22 1501  ? divalproex (DEPAKOTE) DR tablet 750 mg  750 mg Oral BID Ajibola, Ene A, NP   750 mg at 01/09/22 0900  ? gabapentin (NEURONTIN) capsule 400 mg  400 mg Oral TID Ajibola, Ene A, NP   400 mg at 01/09/22 1501  ? hydrOXYzine (ATARAX) tablet 25 mg  25 mg Oral Q6H PRN Ajibola, Ene A, NP      ? magnesium hydroxide (MILK OF MAGNESIA) suspension 30 mL  30 mL Oral Daily PRN Ajibola, Ene A, NP      ? metFORMIN (GLUCOPHAGE) tablet 500 mg  500  mg Oral BID WC Ajibola, Ene A, NP   500 mg at 01/09/22 0900  ? sertraline (ZOLOFT) tablet 100 mg  100 mg Oral Daily Ajibola, Ene A, NP   100 mg at 01/09/22 0902  ? traZODone (DESYREL) tablet 100 mg  100 mg Oral QHS PRN Ajibola, Ene A, NP   100 mg at 01/09/22 0145  ? ?Current Outpatient Medications  ?Medication Sig Dispense Refill  ? amLODipine (NORVASC) 10 MG tablet Take 1 tablet (10 mg total) by mouth daily. 30 tablet 0  ? benztropine (COGENTIN) 0.5 MG tablet Take 1 tablet (0.5 mg total) by mouth 2 (two) times daily. 60 tablet 0  ? chlorproMAZINE (THORAZINE) 25 MG tablet Take 3 tablets (75 mg total) by mouth 2 (two) times daily. 180 tablet 0  ? chlorproMAZINE (THORAZINE) 50 MG tablet Take 3 tablets (150 mg total) by mouth at bedtime. 90 tablet 0  ? divalproex (DEPAKOTE) 250 MG DR tablet Take 3 tablets (750 mg total) by mouth 2 (two) times daily. 180 tablet 0  ? fluticasone (FLONASE) 50 MCG/ACT nasal spray Place 1 spray into both nostrils daily as needed for allergies or rhinitis. 16 g 0  ? gabapentin (NEURONTIN) 400 MG capsule Take 1 capsule (400 mg total) by mouth 3 (three) times daily. 90 capsule 0  ? hydrOXYzine (ATARAX) 25 MG tablet Take 1 tablet (25 mg total) by mouth every 6 (six) hours as needed for anxiety. 60 tablet 0  ? metFORMIN (GLUCOPHAGE) 500 MG tablet Take 1 tablet (500 mg total) by mouth 2 (two) times daily with a meal. 60 tablet 0  ? sertraline (ZOLOFT) 100 MG  tablet Take 1 tablet (100 mg total) by mouth daily. 30 tablet 0  ? traZODone (DESYREL) 100 MG tablet Take 1 tablet (100 mg total) by mouth at bedtime as needed for sleep. 30 tablet 0  ? ? ?PTA Medications: (Not in

## 2022-01-09 NOTE — Discharge Instructions (Signed)
Substance Abuse Resources ? ? ?[facility] ?- Admissions are currently completed Monday through Friday at [facility]; both appointments and walk-ins are accepted.  Any individual that is a [facility] resident may present for a substance abuse screening and assessment for admission.  A person may be referred by numerous sources or self-refer.   Potential clients will be screened for medical necessity and appropriateness for the program.  Clients must meet criteria for high-intensity residential treatment services.  If clinically appropriate, a client will continue with the comprehensive clinical assessment and intake process, as well as enrollment in the [facility]. ? ?Address: [address] ?[place], [place] [address] ?Admin Hours: Mon-Fri 8AM to [place] Hours: 24/7 ?Phone: [phone] ?Fax: [phone] ? ?[facility] (Detox) Facility Based Crisis:  ?These are 3 locations for services: Please call before arrival:   ? ?[facility])  ?Address: [address] [place], [place] [address] ?Phone: [phone] ? ?[facility]) ?Address: [address] [place], [place] [address] ?Phone#: [phone] ? ?[facility]) ?Address: [address] [place], [place], [place] [address] ?Phone#: [phone] ? ? ?[facility]): (offers outpatient therapy and intensive outpatient substance abuse therapy).  ?[address] [place], [place] [address] ?Phone: [phone] ? ?[facility]: ? ? Phone: [phone] ? ?[facility] ?SA Intensive Outpatient Program (SAIOP) means structured individual and group addiction activities and services that are provided at an outpatient program designed to assist adult and adolescent consumers to begin recovery and learn skills for recovery maintenance. The [facility] program is offered at  least 3 hours a day, 3 days a week. SAIOP services shall include a structured program consisting of, but not limited to, the following services: ?Individual counseling and support; Group counseling and support; Family counseling, training or support; Biochemical assays to identify recent drug use (e.g., urine drug screens); Strategies for relapse prevention to include community and social support systems in treatment; Life skills; Crisis contingency planning; Disease Management; and Treatment support activities that have been adapted or specifically designed for persons with physical disabilities, or persons with co-occurring disorders of mental illness and substance abuse/dependence or mental retardation/developmental disability and substance abuse/dependence. ? ?Phone: [phone]  ? ?[facility]) ? ?Address: [place], [place], [place] [address] ?Phone: [phone] ? ? ?[facility] ?Address: [address], [place], [place] [address] ?Phone: [phone] ? ?- a combination of group and individual sessions to meet the participants needs. This allows participants to engage in treatment and remain involved in their home and work life. ?- Transitional housing places program participants in a supportive living environment while they complete a treatment program and work to secure independent housing. ?- The Substance Abuse Intensive Outpatient Treatment Program at [facility] consists of structured group sessions and individual sessions that are designed to teach participants early recovery and relapse prevention skills. ?-[facility] works with the [employer] to provide a housing and treatment program for homeless veterans.  ? ?[facility].  ? ?Address: [address] [place],  [address] ?Phone#: [phone]  ? ?: Referrals to RTSA facilities can be made by [facility].   Referrals are also accepted from physicians, private providers, hospital emergency rooms, family members, or any person who has knowledge of someone in the need of our services. ? ?  [facility] will also offer the following outpatient services: (Monday through Friday 8am-5pm) ?  ?Partial Hospitalization Program (PHP) ?Substance Abuse Intensive Outpatient Program (SA-IOP) ?Group Therapy ?Medication Management ?Peer Living Room ?We also provide (24/7):  ?Assessments: Our mental health clinician and providers will conduct a focused mental health evaluation, assessing for immediate safety concerns and further mental health needs. ?Referral: Our team will provide resources and help connect to community based mental health treatment, when indicated, including psychotherapy, psychiatry, and other specialized behavioral health or substance use disorder services (for those not already in treatment). ?Transitional Care: Our team providers in person bridging and/or telephonic follow-up during the patient's transition to outpatient services.  ? ?[facility] ?24-Hour Call Center: ?[phone] ?Behavioral Health Crisis Line: ?[phone] ? ?

## 2022-01-10 LAB — PROLACTIN: Prolactin: 9.1 ng/mL (ref 4.0–15.2)

## 2022-01-26 ENCOUNTER — Other Ambulatory Visit: Payer: Self-pay

## 2022-01-27 ENCOUNTER — Other Ambulatory Visit: Payer: Self-pay

## 2022-01-29 ENCOUNTER — Other Ambulatory Visit: Payer: Self-pay

## 2022-01-31 ENCOUNTER — Telehealth (HOSPITAL_COMMUNITY): Payer: Self-pay | Admitting: Internal Medicine

## 2022-01-31 NOTE — BH Assessment (Signed)
Care Management - [facility] Follow Up Discharges   Writer attempted to make contact with patient today and was unsuccessful.  The phone number listed in epic is not a working number.   Per chart review, patient was provided with substance abuse outpatient resources.

## 2022-02-07 ENCOUNTER — Ambulatory Visit (HOSPITAL_COMMUNITY)
Admission: EM | Admit: 2022-02-07 | Discharge: 2022-02-07 | Disposition: A | Discharge status: Home / Self Care | Payer: 59

## 2022-02-07 DIAGNOSIS — Z765 Malingerer [conscious simulation]: Secondary | ICD-10-CM | POA: Diagnosis not present

## 2022-02-07 DIAGNOSIS — F332 Major depressive disorder, recurrent severe without psychotic features: Secondary | ICD-10-CM

## 2022-02-07 DIAGNOSIS — F1994 Other psychoactive substance use, unspecified with psychoactive substance-induced mood disorder: Secondary | ICD-10-CM | POA: Diagnosis not present

## 2022-02-07 NOTE — Discharge Summary (Signed)
Duane Salazar to be D/C'd Home per NP order. Discussed with the patient and all questions fully answered. An After Visit Summary was printed and given to the patient. Patient escorted out and D/C home via private auto.  [name]  02/07/2022 9:38 AM

## 2022-02-07 NOTE — Discharge Instructions (Signed)
Take all medications as prescribed. Keep all follow-up appointments as scheduled.  Do not consume alcohol or use illegal drugs while on prescription medications. Report any adverse effects from your medications to your primary care provider promptly.  In the event of recurrent symptoms or worsening symptoms, call 911, a crisis hotline, or go to the nearest emergency department for evaluation.   

## 2022-02-07 NOTE — ED Provider Notes (Addendum)
Behavioral Health Urgent Care Medical Screening Exam  Patient Name: Duane Salazar MRN: [id number] Date of Evaluation: 02/07/22 Chief Complaint:   Diagnosis:  Final diagnoses:  Major depressive disorder, recurrent severe without psychotic features ([place])  Malingering  Substance induced mood disorder ([place])    History of Present illness: Duane Salazar is a 46 y.o. male well know to this service.  Stated " I need to get back on my medications at a lower dose."  Reports he has been off his medications for the past 2 or 3 weeks and would like to be monitored and restarted on medications.  States he used cocaine last night witch was the first time in 3 to 4 days.  Stated " I do have drugs in my system."  He reports homelessness and ongoing suicidal ideations.  "I usually stay overnight, to get my meds straight."  Denies auditory or visual hallucinations.  Discussed making additional outpatient resources available for substance abuse and outpatient mental health services.   [name], 47 y.o., male patient seen face to face by this provider, consulted with Dr. Mamie Levers; and chart reviewed on 02/07/22.  On evaluation Duane Salazar reports suicidal ideations.  During evaluation Duane Salazar is sitting in no acute distress.  He is alert/oriented x 4; calm/cooperative; and mood congruent with affect. He is speaking in a clear tone at moderate volume, and normal pace; with good eye contact. His thought process is coherent and relevant; There is no indication that he is currently responding to internal/external stimuli or experiencing delusional thought content; and he is denying psychosis or paranoia.  Patient has remained calm throughout assessment and has answered questions appropriately.    At this time Duane Salazar is educated and verbalizes understanding of mental health resources and other crisis services in the community. He is instructed to call 911 and present to the nearest emergency room should he  experience any suicidal/homicidal ideation, auditory/visual/hallucinations, or detrimental worsening of his mental health condition. He  was a also advised by Probation officer that he could call the toll-free phone on insurance card to assist with identifying in network counselors and agencies or number on back of Medicaid card t speak with care coordinator   Psychiatric Specialty Exam  Presentation  General Appearance:Disheveled  Eye Contact:Good  Speech:Clear and Coherent  Speech Volume:Normal (thick toung)  Handedness:Right   Mood and Affect  Mood:Anxious  Affect:Congruent   Thought Process  Thought Processes:Coherent  Descriptions of Associations:Intact  Orientation:Full (Time, Place and Person)  Thought Content:Logical  Diagnosis of Schizophrenia or Schizoaffective disorder in past: No  Duration of Psychotic Symptoms: N/A  Hallucinations:None telling him to hurt himself and others when upset, feeling agitated Voices telling her to attack others when he feels frustrated agitated None reported  Ideas of Reference:None  Suicidal Thoughts:Yes, Passive Without Intent With Access to Means  Homicidal Thoughts:Yes, Active Without Intent With Access to Means   Sensorium  Memory:Immediate Fair; Recent Fair; Remote Fair  Judgment:Fair  Insight:Fair   Executive Functions  Concentration:Fair  Attention Span:Fair  [place]   Psychomotor Activity  Psychomotor Activity:Shuffling Gait   Assets  Assets:Financial Resources/Insurance; Social Support; Housing   Sleep  Sleep:Fair  Number of hours: 7   Nutritional Assessment (For OBS and FBC admissions only) Has the patient had a weight loss or gain of 10 pounds or more in the last 3 months?: No Has the patient had a decrease in food intake/or appetite?: No Does the patient have dental problems?:  No Does the patient have eating habits or behaviors that may be  indicators of an eating disorder including binging or inducing vomiting?: No Has the patient recently lost weight without trying?: 0 Has the patient been eating poorly because of a decreased appetite?: 0 Malnutrition Screening Tool Score: 0    Physical Exam: Physical Exam Vitals and nursing note reviewed.  Cardiovascular:     Rate and Rhythm: Normal rate.     Pulses: Normal pulses.  Neurological:     General: No focal deficit present.  Psychiatric:        Mood and Affect: Mood normal.        Thought Content: Thought content normal.    Review of Systems  Cardiovascular: Negative.   Genitourinary: Negative.   Skin: Negative.   Psychiatric/Behavioral:  Positive for depression, substance abuse and suicidal ideas. Negative for hallucinations. The patient is nervous/anxious.   All other systems reviewed and are negative.  Blood pressure (!) 145/87, pulse 94, temperature 98.2 F (36.8 C), temperature source Oral, resp. rate 20, SpO2 100 %. There is no height or weight on file to calculate BMI.  Musculoskeletal: Strength & Muscle Tone: within normal limits Gait & Station: normal Patient leans: N/A   [name] Discharge Disposition for Follow up and Recommendations: Based on my evaluation the patient does not appear to have an emergency medical condition and can be discharged with resources and follow up care in outpatient services for Medication Management and Substance Abuse Intensive Outpatient Program   D[place]ill [place], NP 02/07/2022, 9:47 AM

## 2022-02-07 NOTE — ED Provider Notes (Signed)
Multiple attempts made to contact mother's boyfriend and patient's uncle regarding duty to warn.  Contact information provided in chart is not valid/ wrong number.  NP followed up with [facility] nonemergent to file a report.  Additional information was warranted.  Case staffed with attending psychiatrist.
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
## 2022-02-22 ENCOUNTER — Encounter: Payer: Self-pay | Admitting: Physician Assistant

## 2022-02-22 ENCOUNTER — Ambulatory Visit: Payer: 59 | Admitting: Physician Assistant

## 2022-02-22 ENCOUNTER — Ambulatory Visit: Payer: Self-pay

## 2022-02-22 ENCOUNTER — Other Ambulatory Visit: Payer: Self-pay

## 2022-02-22 VITALS — BP 125/101 | HR 93 | Resp 18 | Ht 75.0 in | Wt 248.0 lb

## 2022-02-22 DIAGNOSIS — E220 Acromegaly and pituitary gigantism: Secondary | ICD-10-CM | POA: Diagnosis not present

## 2022-02-22 DIAGNOSIS — M25561 Pain in right knee: Secondary | ICD-10-CM

## 2022-02-22 DIAGNOSIS — M1990 Unspecified osteoarthritis, unspecified site: Secondary | ICD-10-CM

## 2022-02-22 DIAGNOSIS — M25551 Pain in right hip: Secondary | ICD-10-CM | POA: Diagnosis not present

## 2022-02-22 DIAGNOSIS — G5621 Lesion of ulnar nerve, right upper limb: Secondary | ICD-10-CM

## 2022-02-22 DIAGNOSIS — G5691 Unspecified mononeuropathy of right upper limb: Secondary | ICD-10-CM

## 2022-02-22 DIAGNOSIS — F411 Generalized anxiety disorder: Secondary | ICD-10-CM

## 2022-02-22 DIAGNOSIS — G8929 Other chronic pain: Secondary | ICD-10-CM

## 2022-02-22 DIAGNOSIS — F17210 Nicotine dependence, cigarettes, uncomplicated: Secondary | ICD-10-CM

## 2022-02-22 MED ORDER — GABAPENTIN 400 MG PO CAPS
400.0000 mg | ORAL_CAPSULE | Freq: Three times a day (TID) | ORAL | 1 refills | Status: DC
Start: 2022-02-22 — End: 2022-04-28
  Filled 2022-02-22: qty 90, 30d supply, fill #0
  Filled 2022-04-02: qty 90, 30d supply, fill #1

## 2022-02-22 MED ORDER — CELECOXIB 200 MG PO CAPS
200.0000 mg | ORAL_CAPSULE | Freq: Two times a day (BID) | ORAL | 1 refills | Status: DC
  Filled 2022-02-22: qty 60, 30d supply, fill #0

## 2022-02-22 MED ORDER — KETOROLAC TROMETHAMINE 60 MG/2ML IM SOLN
60.0000 mg | Freq: Once | INTRAMUSCULAR | Status: AC
  Administered 2022-02-22: 60 mg via INTRAMUSCULAR

## 2022-02-22 NOTE — Telephone Encounter (Signed)
Pt called, number in summary doesn't have VM set up, left VM on [phone] for pt to call office back to discuss with a nurse.   Summary: Arthritis pain, seeking appt   [phone] Pt called requesting an appt, says he is having trouble walking and getting out of bed. Says this is arthritis pain in his right hip. No appts soon enough for patient

## 2022-02-23 ENCOUNTER — Other Ambulatory Visit: Payer: Self-pay

## 2022-02-23 ENCOUNTER — Encounter: Payer: Self-pay | Admitting: Physician Assistant

## 2022-02-23 DIAGNOSIS — G8929 Other chronic pain: Secondary | ICD-10-CM | POA: Insufficient documentation

## 2022-02-25 ENCOUNTER — Other Ambulatory Visit: Payer: Self-pay

## 2022-03-05 ENCOUNTER — Telehealth (HOSPITAL_COMMUNITY): Payer: Self-pay | Admitting: Internal Medicine

## 2022-03-05 NOTE — BH Assessment (Signed)
Care Management - [place] Follow Up Discharges    Writer attempted to make contact with patient today and was unsuccessful.  The phone number listed in epic is not a working number.    Per chart review, patient was provided with substance abuse outpatient resources and shelter resources.

## 2022-03-09 ENCOUNTER — Other Ambulatory Visit: Payer: Self-pay

## 2022-03-09 ENCOUNTER — Encounter: Payer: Self-pay | Admitting: Orthopaedic Surgery

## 2022-03-09 ENCOUNTER — Ambulatory Visit: Payer: 59 | Admitting: Orthopaedic Surgery

## 2022-03-09 ENCOUNTER — Ambulatory Visit (INDEPENDENT_AMBULATORY_CARE_PROVIDER_SITE_OTHER): Payer: 59

## 2022-03-09 DIAGNOSIS — M79641 Pain in right hand: Secondary | ICD-10-CM

## 2022-03-09 DIAGNOSIS — M1611 Unilateral primary osteoarthritis, right hip: Secondary | ICD-10-CM

## 2022-03-09 NOTE — Progress Notes (Unsigned)
Office Visit Note   Patient: Duane Salazar           Date of Birth: 15-Sep-1975           MRN: [id number] Visit Date: 03/09/2022              Requested by: Mayers, Loraine Grip, PA-C [address] [place],  [place] [address] PCP: Ladell Pier, MD   Assessment & Plan: Visit Diagnoses:  1. Pain in right hand   2. Unilateral primary osteoarthritis, right hip     Plan: Impression is chronic right hip pain and right hand pain and paresthesias primarily to the ring and small fingers.  In regards to the hip, we have discussed various treatment options to include intra-articular cortisone injection versus total hip arthroplasty.  He is currently not interested in injection as this would only temporize his pain at best.  He has underlying type 2 diabetes and hepatitis C in addition to his pituitary tumor.  We will obtain a hemoglobin A1c today and make referral to infectious disease for his hepatitis C as well as referral to neuro for his pituitary tumor to get these under control prior to proceeding with surgery.  He understands and agrees.  In regards to the ulnar nerve neuropathy, I would like to start with a nerve conduction study both upper extremities.  He will follow-up with [place] once completed.  Call with concerns or questions in meantime.  Follow-Up Instructions: Return for after NCS.   Orders:  Orders Placed This Encounter  Procedures   XR Hand Complete Right   Hemoglobin A1C   No orders of the defined types were placed in this encounter.     Procedures: No procedures performed   Clinical Data: No additional findings.   Subjective: Chief Complaint  Patient presents with   Right Hand - Pain   Right Hip - Pain    HPI patient is a pleasant 46 year old gentleman with underlying pituitary tumor who comes in today with right hip pain and right hand pain and paresthesias.  In regards to his hip, has had pain for the past 2 years.  The majority of his pain is to the  groin.  Pain is worse with walking as well as lifting his leg.  He has been taking Tylenol without relief.  No previous right hip injection.  In regards to the right hand, he has been complaining of constant throbbing sensation as well as hypersensitivity and numbness to the ring and small fingers for the past year or so.  He denies any injury.  He is recently started noticing muscle wasting the intrinsic muscles between the thumb and index finger of both hands.  Any use of these fingers seems to worsen his symptoms.  He notes that he recently had an IM cortisone injection which provided relief as well as helped range of motion of these fingers.  Of note, he has not been seen in follow-up for his pituitary tumor since being released from prison about 2 years ago.  Review of Systems as detailed in HPI.  All others reviewed and are negative.   Objective: Vital Signs: There were no vitals taken for this visit.  Physical Exam well-developed well-nourished gentleman no acute distress but alert and oriented x3.  Ortho Exam right hip exam shows markedly positive logroll with very little rotation.  Positive FADIR.  Right hand exam shows marked and diffuse tenderness throughout the ring and small fingers.  He is unable to  fully extend and flex the ring and small fingers.  He has muscle wasting along the intrinsic muscle between the thumb and index finger both hands.  Decree sensation throughout the right ring and small fingers. Negative tinel at the elbow.    Specialty Comments:  No specialty comments available.  Imaging: XR Hand Complete Right  Result Date: 03/09/2022 Diffuse arthritis throughout the hand and fingers.    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[place]) 09/04/2021   Schizoaffective disorder ([place]) 02/11/2020   Schizophrenia ([place]) 02/10/2020   Hep C w/o coma, chronic ([place]) 10/21/2015   Injury of right Achilles tendon 10/17/2015   Tinea pedis 10/17/2015   Onychomycosis of toenail 10/17/2015   Secondary adrenal insufficiency ([place]) 10/17/2015   Diabetes mellitus type 2 in obese ([place])    Alcohol use disorder, mild, abuse 09/18/2015   Cannabis use disorder, mild, abuse 09/18/2015   Opioid use disorder, moderate, dependence ([place]) 09/18/2015   Tobacco use disorder 09/18/2015   Cocaine use disorder, severe, dependence ([place]) 09/16/2015   Schizoaffective disorder, depressive type ([place])    Suicidal ideation 05/07/2015   Polysubstance dependence ([place]) 05/07/2015   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[facility])    Chronic pain syndrome 03/11/2015   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   Chronic headache 01/02/2015   Status post transsphenoidal pituitary resection ([place]) 01/02/2015   Essential hypertension 10/21/2014   Morbid obesity with BMI of 40.0-44.9, adult ([place]) 10/21/2014   Sleep apnea    Arthritis    Diabetes type 2, uncontrolled    Adrenal insufficiency ([place]) 12/23/2005   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[place]) 12/08/2005   Past Medical History:  Diagnosis Date   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[place]) 2004   AKI (acute kidney injury) ([place]) 03/21/2015   Arthritis Dx 2002   Diabetes type 2, controlled ([place]) 2010   Drug abuse ([place])    Headache    Hypertension Dx 2002   Pituitary macroadenoma ([place]) 2004   Schizo affective schizophrenia ([place]) 1995   Schizophrenia ([place])    Seizures ([place])    Sleep apnea 1995   on CPAP    Family History   Problem Relation Age of Onset   Hypertension Mother    Diabetes Mother    Heart Problems Mother    Cancer Maternal Uncle    Alcoholism Maternal Uncle    Cancer Maternal Grandmother    Heart disease Neg Hx     Past Surgical History:  Procedure Laterality Date   PITUITARY SURGERY  2005 & 2012   SKIN BIOPSY     Social History   Occupational History   Not on file  Tobacco Use   Smoking status: Every Day    Packs/day: 1.00    Years: 20.00    Total pack years: 20.00    Types: Cigarettes   Smokeless tobacco: Never   Tobacco comments:    Smoking .5 ppd  Substance and Sexual Activity   Alcohol use: Yes    Alcohol/week: 2.0  standard drinks of alcohol    Types: 2 Cans of beer per week    Comment: 2 cans on workdays; 6-pk on days off; varies   Drug use: Yes    Types: Cocaine, Marijuana, Heroin    Comment: Reports use 2 grams Cocaine 10/28/21   Sexual activity: Yes

## 2022-03-10 ENCOUNTER — Other Ambulatory Visit: Payer: Self-pay

## 2022-03-10 LAB — HEMOGLOBIN A1C
Hgb A1c MFr Bld: 6.9 % of total Hgb — ABNORMAL HIGH (ref <5.7)
Mean Plasma Glucose: 151 mg/dL
eAG (mmol/L): 8.4 mmol/L

## 2022-03-11 ENCOUNTER — Other Ambulatory Visit: Payer: Self-pay

## 2022-03-12 ENCOUNTER — Encounter: Payer: Self-pay | Admitting: Neurology

## 2022-03-18 ENCOUNTER — Telehealth: Payer: Self-pay

## 2022-03-18 NOTE — Telephone Encounter (Signed)
Rcvd call from Duane Salazar(same address/cell) asking who is calling the number registered to this account. I noticed there are 3 of them with same address and phone number. Duane Salazar said the other room mate called last week asking the same thing. I advised the caller that we do not have him listed as a patient and have not attempted to reach him. He asked for names of patients that could be associated with the same phone number or address and I declined and advised him of [place].   Because this is now the second call from another household member I reported this to my supervisor Duane Salazar) for any needed security measures. Also added a note that all staff can see stating to be careful with anything over phone since shared address and phone info.

## 2022-03-22 ENCOUNTER — Telehealth: Payer: Self-pay

## 2022-03-22 ENCOUNTER — Other Ambulatory Visit (HOSPITAL_COMMUNITY): Payer: Self-pay

## 2022-03-22 NOTE — Telephone Encounter (Signed)
[facility] Patient Teacher, English as a foreign language completed.    The patient is insured through [employer].  Medication will need a PA  We will continue to follow to see if copay assistance is needed.  Ileene Patrick, [facility] Patient [facility] Phone: [phone] Fax:  [phone]

## 2022-03-23 ENCOUNTER — Encounter: Payer: Self-pay | Admitting: Family

## 2022-03-23 ENCOUNTER — Ambulatory Visit (INDEPENDENT_AMBULATORY_CARE_PROVIDER_SITE_OTHER): Payer: 59 | Admitting: Family

## 2022-03-23 ENCOUNTER — Other Ambulatory Visit: Payer: Self-pay

## 2022-03-23 VITALS — BP 143/104 | HR 80 | Temp 97.7°F | Resp 16 | Ht 75.5 in | Wt 261.0 lb

## 2022-03-23 DIAGNOSIS — F1990 Other psychoactive substance use, unspecified, uncomplicated: Secondary | ICD-10-CM | POA: Diagnosis not present

## 2022-03-23 DIAGNOSIS — B182 Chronic viral hepatitis C: Secondary | ICD-10-CM

## 2022-03-23 NOTE — Assessment & Plan Note (Signed)
Continues to use marijuana and cocaine although less than previous. Discussed risk of possible reinfection with continued drug use. Recommended cessation and discussed available resources.

## 2022-03-23 NOTE — Progress Notes (Signed)
Subjective:    Patient ID: Duane Salazar, male    DOB: 11-26-1975, 46 y.o.   MRN: [id number]  Chief Complaint  Patient presents with   New Patient (Initial Visit)    Hep C     HPI:  Duane Salazar is a 46 y.o. male with previous medical history of depression, diabetes, polysubstance use, hypertension, and schizoaffective disorder presents today for evaluation of Hepatitis C.  [name] had a positive Hepatitis C antibody test in November 2016 with positive RNA level of 17,831 in February 2017. Risk factors for Hepatitis C include IV drug use and tattoos. No treatment since initial diagnosis. No personal or family history of liver disease. No current symptoms and denies abdominal pain, nausea, vomiting, fatigue, scleral icterus or jaundice.  Continues to use cocaine, marijuana and alcohol on occasion.    No Known Allergies    Outpatient Medications Prior to Visit  Medication Sig Dispense Refill   amLODipine (NORVASC) 10 MG tablet Take 1 tablet (10 mg total) by mouth daily. 30 tablet 0   benztropine (COGENTIN) 0.5 MG tablet Take 1 tablet (0.5 mg total) by mouth 2 (two) times daily. 60 tablet 0   celecoxib (CELEBREX) 200 MG capsule Take 1 capsule (200 mg total) by mouth 2 (two) times daily. 60 capsule 1   chlorproMAZINE (THORAZINE) 25 MG tablet Take 3 tablets (75 mg total) by mouth 2 (two) times daily. 180 tablet 0   chlorproMAZINE (THORAZINE) 50 MG tablet Take 3 tablets (150 mg total) by mouth at bedtime. 90 tablet 0   gabapentin (NEURONTIN) 400 MG capsule Take 1 capsule (400 mg total) by mouth 3 (three) times daily. 90 capsule 1   hydrOXYzine (ATARAX) 25 MG tablet Take 1 tablet (25 mg total) by mouth every 6 (six) hours as needed for anxiety. 60 tablet 0   metFORMIN (GLUCOPHAGE) 500 MG tablet Take 1 tablet (500 mg total) by mouth 2 (two) times daily with a meal. 60 tablet 0   traZODone (DESYREL) 100 MG tablet Take 1 tablet (100 mg total) by mouth at bedtime as needed for sleep. 30 tablet  0   divalproex (DEPAKOTE) 250 MG DR tablet Take 3 tablets (750 mg total) by mouth 2 (two) times daily. (Patient not taking: Reported on 03/23/2022) 180 tablet 0   fluticasone (FLONASE) 50 MCG/ACT nasal spray Place 1 spray into both nostrils daily as needed for allergies or rhinitis. (Patient not taking: Reported on 03/23/2022) 16 g 0   sertraline (ZOLOFT) 100 MG tablet Take 1 tablet (100 mg total) by mouth daily. (Patient not taking: Reported on 03/23/2022) 30 tablet 0   No facility-administered medications prior to visit.     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  Respiratory:  Negative for cough, chest tightness, shortness of breath and wheezing.   Cardiovascular:  Negative for chest pain.  Gastrointestinal:  Negative for abdominal distention, abdominal pain, constipation, diarrhea, nausea and vomiting.  Neurological:  Negative for weakness and headaches.  Hematological:  Does not bruise/bleed easily.      Objective:    BP (!) 143/104   Pulse 80   Temp 97.7 F (36.5 C) (Oral)   Resp 16   Ht 6' 3.5" (1.918 m)   Wt 261 lb (118.4 kg)   SpO2 100%   BMI 32.19 kg/m  Nursing note and vital signs reviewed.  Physical Exam Constitutional:      General: He is not in acute distress.    Appearance: He is well-developed.  Cardiovascular:     Rate and Rhythm: Normal rate and regular rhythm.     Heart sounds: Normal heart sounds. No murmur  heard.    No friction rub. No gallop.  Pulmonary:     Effort: Pulmonary effort is normal. No respiratory distress.     Breath sounds: Normal breath sounds. No wheezing or rales.  Chest:     Chest wall: No tenderness.  Abdominal:     General: Bowel sounds are normal. There is no distension.     Palpations: Abdomen is soft. There is no mass.     Tenderness: There is no abdominal tenderness. There is no guarding or rebound.  Skin:    General: Skin is warm and dry.  Neurological:     Mental Status: He is alert and oriented to person, place, and time.  Psychiatric:        Behavior: Behavior normal.        Thought Content: Thought content normal.        Judgment: Judgment normal.         03/23/2022    9:52 AM 02/22/2022    1:17 PM 11/26/2021    8:47 AM 10/01/2021   11:40 AM 09/04/2021    3:17 PM  Depression screen PHQ 2/9  Decreased Interest '3 1   1  '$ Down, Depressed, Hopeless '3 1   3  '$ PHQ - 2 Score '6 2   4  '$ Altered sleeping  1   0  Tired, decreased energy  1   3  Change in appetite  1   2  Feeling bad or failure about yourself   1   1  Trouble concentrating  1   2  Moving slowly or fidgety/restless  1   0  Suicidal thoughts  1   0  PHQ-9 Score  9   12  Difficult doing work/chores  Extremely dIfficult        Information is confidential and restricted. Go to Review Flowsheets to unlock data.       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/2021   Schizoaffective disorder ([place]) 02/11/2020   Schizophrenia ([place]) 02/10/2020   Hep C w/o coma, chronic ([place]) 10/21/2015   Injury of right Achilles tendon 10/17/2015  Tinea pedis 10/17/2015   Onychomycosis of toenail 10/17/2015   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   Diabetes mellitus type 2 in obese [facility])    Alcohol use disorder, mild, abuse 09/18/2015   Cannabis use disorder, mild, abuse 09/18/2015   Opioid use disorder, moderate, dependence ([place]) 09/18/2015   Tobacco use disorder 09/18/2015   Cocaine use disorder, severe, dependence ([place]) 09/16/2015   Schizoaffective disorder, depressive type ([place])    Suicidal ideation 05/07/2015   Polysubstance dependence ([place]) 05/07/2015   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 Newey is a 46 y/o AA male with chronic Hepatitis C with risk factors of IV drug use and tattoos. Treatment naive and symptomatic. Reviewed the basics of Hepatitis C including transmission, risks if left untreated, lab work, treatment options and plan of care. Check lab work today including Hepatitis B status, HIV status, and Hepatitis C. He would prefer Mavyret if appropriate.  Plan for follow up in 1 month after start of medication pending lab work results.       Relevant Orders   Hepatic function panel   CBC   Hepatitis B surface antigen   Hepatitis B  surface antibody,quantitative   Hepatitis C genotype   Hepatitis C RNA quantitative   HIV Antibody (routine testing w rflx)   Liver Fibrosis, FibroTest-ActiTest   Protime-INR     Other   Substance use disorder    Continues to use marijuana and cocaine although less than previous. Discussed risk of possible reinfection with continued drug use. Recommended cessation and discussed available resources.         I have discontinued Sair Gearin's fluticasone, divalproex, and sertraline. I am also having him maintain his amLODipine, chlorproMAZINE, chlorproMAZINE, metFORMIN, hydrOXYzine, benztropine, traZODone, gabapentin, and celecoxib.    Follow-up: 1 month after starting medication or sooner if needed.    Terri Piedra, MSN, FNP-C Nurse Practitioner [facility] [place] number: [phone]

## 2022-03-23 NOTE — Assessment & Plan Note (Signed)
Duane Salazar is a 46 y/o AA male with chronic Hepatitis C with risk factors of IV drug use and tattoos. Treatment naive and symptomatic. Reviewed the basics of Hepatitis C including transmission, risks if left untreated, lab work, treatment options and plan of care. Check lab work today including Hepatitis B status, HIV status, and Hepatitis C. He would prefer Mavyret if appropriate.  Plan for follow up in 1 month after start of medication pending lab work results.

## 2022-03-23 NOTE — Patient Instructions (Signed)
Nice to see you.  We will check your lab work today.  Have a great day and stay safe!  Limit acetaminophen (Tylenol) usage to no more than 2 grams (2,000 mg) per day.  Avoid alcohol.  Do not share toothbrushes or razors.  Practice safe sex to protect against transmission as well as sexually transmitted disease.    Hepatitis C Hepatitis C is a viral infection of the liver. It can lead to scarring of the liver (cirrhosis), liver failure, or liver cancer. Hepatitis C may go undetected for months or years because people with the infection may not have symptoms, or they may have only mild symptoms. What are the causes? This condition is caused by the hepatitis C virus (HCV). The virus can spread from person to person (is contagious) through: Blood. Childbirth. A woman who has hepatitis C can pass it to her baby during birth. Bodily fluids, such as breast milk, tears, semen, vaginal fluids, and saliva. Blood transfusions or organ transplants done in the [place] before 1992.  What increases the risk? The following factors may make you more likely to develop this condition: Having contact with unclean (contaminated) needles or syringes. This may result from: Acupuncture. Tattoing. Body piercing. Injecting drugs. Having unprotected sex with someone who is infected. Needing treatment to filter your blood (kidney dialysis). Having HIV (human immunodeficiency virus) or AIDS (acquired immunodeficiency syndrome). Working in a job that involves contact with blood or bodily fluids, such as health care.  What are the signs or symptoms? Symptoms of this condition include: Fatigue. Loss of appetite. Nausea. Vomiting. Abdominal pain. Dark yellow urine. Yellowish skin and eyes (jaundice). Itchy skin. Clay-colored bowel movements. Joint pain. Bleeding and bruising easily. Fluid building up in your stomach (ascites).  In some cases, you may not have any symptoms. How is this  diagnosed? This condition is diagnosed with: Blood tests. Other tests to check how well your liver is functioning. They may include: Magnetic resonance elastography (MRE). This imaging test uses MRIs and sound waves to measure liver stiffness. Transient elastography. This imaging test uses ultrasounds to measure liver stiffness. Liver biopsy. This test requires taking a small tissue sample from your liver to examine it under a microscope.  How is this treated? Your health care provider may perform noninvasive tests or a liver biopsy to help decide the best course of treatment. Treatment may include: Antiviral medicines and other medicines. Follow-up treatments every 6-12 months for infections or other liver conditions. Receiving a donated liver (liver transplant).  Follow these instructions at home: Medicines Take over-the-counter and prescription medicines only as told by your health care provider. Take your antiviral medicine as told by your health care provider. Do not stop taking the antiviral even if you start to feel better. Do not take any medicines unless approved by your health care provider, including over-the-counter medicines and birth control pills. Activity Rest as needed. Do not have sex unless approved by your health care provider. Ask your health care provider when you may return to school or work. Eating and drinking Eat a balanced diet with plenty of fruits and vegetables, whole grains, and lowfat (lean) meats or non-meat proteins (such as beans or tofu). Drink enough fluids to keep your urine clear or pale yellow. Do not drink alcohol. General instructions Do not share toothbrushes, nail clippers, or razors. Wash your hands frequently with soap and water. If soap and water are not available, use hand sanitizer. Cover any cuts or open sores on your   skin to prevent spreading the virus. Keep all follow-up visits as told by your health care provider. This is important.  You may need follow-up visits every 6-12 months. How is this prevented? There is no vaccine for hepatitis C. The only way to prevent the disease is to reduce the risk of exposure to the virus. Make sure you: Wash your hands frequently with soap and water. If soap and water are not available, use hand sanitizer. Do not share needles or syringes. Practice safe sex and use condoms. Avoid handling blood or bodily fluids without gloves or other protection. Avoid getting tattoos or piercings in shops or other locations that are not clean.  Contact a health care provider if: You have a fever. You develop abdominal pain. You pass dark urine. You pass clay-colored stools. You develop joint pain. Get help right away if: You have increasing fatigue or weakness. You lose your appetite. You cannot eat or drink without vomiting. You develop jaundice or your jaundice gets worse. You bruise or bleed easily. Summary Hepatitis C is a viral infection of the liver. It can lead to scarring of the liver (cirrhosis), liver failure, or liver cancer. The hepatitis C virus (HCV) causes this condition. The virus can pass from person to person (is contagious). You should not take any medicines unless approved by your health care provider. This includes over-the-counter medicines and birth control pills. This information is not intended to replace advice given to you by your health care provider. Make sure you discuss any questions you have with your health care provider. Document Released: 08/13/2000 Document Revised: 09/21/2016 Document Reviewed: 09/21/2016 Elsevier Interactive Patient Education  2018 [employer]  

## 2022-03-26 ENCOUNTER — Ambulatory Visit: Payer: 59 | Attending: Internal Medicine | Admitting: Internal Medicine

## 2022-03-26 ENCOUNTER — Other Ambulatory Visit: Payer: Self-pay

## 2022-03-26 ENCOUNTER — Encounter: Payer: Self-pay | Admitting: Internal Medicine

## 2022-03-26 VITALS — BP 138/99 | HR 77 | Ht 75.5 in | Wt 261.0 lb

## 2022-03-26 DIAGNOSIS — E669 Obesity, unspecified: Secondary | ICD-10-CM

## 2022-03-26 DIAGNOSIS — E1159 Type 2 diabetes mellitus with other circulatory complications: Secondary | ICD-10-CM | POA: Diagnosis not present

## 2022-03-26 DIAGNOSIS — B182 Chronic viral hepatitis C: Secondary | ICD-10-CM

## 2022-03-26 DIAGNOSIS — E2749 Other adrenocortical insufficiency: Secondary | ICD-10-CM

## 2022-03-26 DIAGNOSIS — E893 Postprocedural hypopituitarism: Secondary | ICD-10-CM

## 2022-03-26 DIAGNOSIS — Z6832 Body mass index (BMI) 32.0-32.9, adult: Secondary | ICD-10-CM

## 2022-03-26 DIAGNOSIS — E1169 Type 2 diabetes mellitus with other specified complication: Secondary | ICD-10-CM | POA: Diagnosis not present

## 2022-03-26 DIAGNOSIS — M1611 Unilateral primary osteoarthritis, right hip: Secondary | ICD-10-CM | POA: Diagnosis not present

## 2022-03-26 DIAGNOSIS — F251 Schizoaffective disorder, depressive type: Secondary | ICD-10-CM

## 2022-03-26 DIAGNOSIS — Z1211 Encounter for screening for malignant neoplasm of colon: Secondary | ICD-10-CM

## 2022-03-26 DIAGNOSIS — I152 Hypertension secondary to endocrine disorders: Secondary | ICD-10-CM

## 2022-03-26 LAB — HEPATIC FUNCTION PANEL
AG Ratio: 1.6 (calc) (ref 1.0–2.5)
ALT: 19 U/L (ref 9–46)
AST: 17 U/L (ref 10–40)
Albumin: 4.3 g/dL (ref 3.6–5.1)
Alkaline phosphatase (APISO): 54 U/L (ref 36–130)
Bilirubin, Direct: 0.2 mg/dL (ref 0.0–0.2)
Globulin: 2.7 g/dL (calc) (ref 1.9–3.7)
Indirect Bilirubin: 0.4 mg/dL (calc) (ref 0.2–1.2)
Total Bilirubin: 0.6 mg/dL (ref 0.2–1.2)
Total Protein: 7 g/dL (ref 6.1–8.1)

## 2022-03-26 LAB — CBC
HCT: 44.5 % (ref 38.5–50.0)
Hemoglobin: 13.9 g/dL (ref 13.2–17.1)
MCH: 23.8 pg — ABNORMAL LOW (ref 27.0–33.0)
MCHC: 31.2 g/dL — ABNORMAL LOW (ref 32.0–36.0)
MCV: 76.1 fL — ABNORMAL LOW (ref 80.0–100.0)
MPV: 11.3 fL (ref 7.5–12.5)
Platelets: 264 10*3/uL (ref 140–400)
RBC: 5.85 10*6/uL — ABNORMAL HIGH (ref 4.20–5.80)
RDW: 14.2 % (ref 11.0–15.0)
WBC: 6.8 10*3/uL (ref 3.8–10.8)

## 2022-03-26 LAB — HEPATITIS B SURFACE ANTIGEN: Hepatitis B Surface Ag: NONREACTIVE

## 2022-03-26 LAB — HEPATITIS C RNA QUANTITATIVE
HCV Quantitative Log: 5.5 log IU/mL — ABNORMAL HIGH
HCV RNA, PCR, QN: 315000 IU/mL — ABNORMAL HIGH

## 2022-03-26 LAB — PROTIME-INR
INR: 1
Prothrombin Time: 10.9 s (ref 9.0–11.5)

## 2022-03-26 LAB — GLUCOSE, POCT (MANUAL RESULT ENTRY): POC Glucose: 177 mg/dl — AB (ref 70–99)

## 2022-03-26 LAB — HEPATITIS C GENOTYPE

## 2022-03-26 LAB — HEPATITIS B SURFACE ANTIBODY, QUANTITATIVE: Hep B S AB Quant (Post): <5 m[IU]/mL — ABNORMAL LOW (ref >=10)

## 2022-03-26 LAB — HIV ANTIBODY (ROUTINE TESTING W REFLEX): HIV 1&2 Ab, 4th Generation: NONREACTIVE

## 2022-03-26 MED ORDER — METFORMIN HCL 500 MG PO TABS
500.0000 mg | ORAL_TABLET | Freq: Two times a day (BID) | ORAL | 5 refills | Status: AC
  Filled 2022-03-26 – 2022-04-28 (×2): qty 60, 30d supply, fill #0

## 2022-03-26 MED ORDER — KETOROLAC TROMETHAMINE 60 MG/2ML IM SOLN
60.0000 mg | Freq: Once | INTRAMUSCULAR | Status: AC
  Administered 2022-03-26: 60 mg via INTRAMUSCULAR

## 2022-03-26 MED ORDER — AMLODIPINE BESYLATE 10 MG PO TABS
10.0000 mg | ORAL_TABLET | Freq: Every day | ORAL | 6 refills | Status: AC
  Filled 2022-03-26 – 2022-04-28 (×2): qty 30, 30d supply, fill #0

## 2022-03-26 MED ORDER — CELECOXIB 200 MG PO CAPS
200.0000 mg | ORAL_CAPSULE | Freq: Every day | ORAL | 3 refills | Status: AC
  Filled 2022-03-26 – 2022-04-28 (×2): qty 30, 30d supply, fill #0

## 2022-03-26 MED ORDER — PREDNISONE 5 MG PO TABS
5.0000 mg | ORAL_TABLET | Freq: Every day | ORAL | 1 refills | Status: AC
  Filled 2022-03-26 – 2022-04-02 (×2): qty 30, 30d supply, fill #0
  Filled 2022-04-28: qty 30, 30d supply, fill #1

## 2022-03-26 NOTE — Progress Notes (Signed)
Patient ID: Duane Salazar, male    DOB: 27-Jun-1976  MRN: [id number]  CC: Medication Refill   Subjective: Duane Salazar is a 46 y.o. male who presents for chronic disease management. 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  Currently on Cogentin and Thorazine.  Reports he is taking them daily. No BH provider as yet.   Had planned to call [facility].  He has Friday insurance. Denies any active thoughts of suicide.  Reports his depression is better because he is now staying with his mother and away from "the scenery" that was negatively impacting his mental health.  Hep C: He is seen ID for chronic hepatitis C diagnosis.  Looks like they plan to start treatment.  Patient advised to stay away from street drug use.  OA RT hip: He has seen orthopedics Dr. Erlinda Hong.  Diagnosed with osteoarthritis of the right hip and ulnar neuropathy of the right hand.  Patient interested in total hip replacement.  The orthopedics recommended that he see the infectious disease specialist for treatment of hepatitis C and referred him to neuro for past history of pituitary tumor before proceeding with THR.   Wants a Toradol shot today. Needing refill on Celebrex.  DM: Lab Results  Component Value Date   HGBA1C 6.9 (H) 03/09/2022  Currently on metformin.  Needs refill.  Trying to do better with his eating habits.  He also has history of hypertension and is supposed to be on Norvasc but has been out of that and requesting refill as well.  He tries to limit salt in the foods.  No chest pains or shortness of breath.  Adrenal insuff/Acromegaly: out of Prednisone for a while.  Feels down and weak when not on it.  Used  to be followed by endocrinology at [facility] but has not seen them in over 2 to 3 years.  I had referred him to [facility] endocrinology earlier this year.  They tried to contact him unsuccessfully. 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[place]) 09/04/2021   Schizoaffective disorder ([place]) 02/11/2020   Schizophrenia ([place]) 02/10/2020   Hep C w/o coma, chronic ([place]) 10/21/2015   Injury of right Achilles tendon 10/17/2015   Tinea pedis 10/17/2015   Onychomycosis of toenail 10/17/2015   Secondary adrenal insufficiency ([place]) 10/17/2015   Diabetes mellitus type 2 in obese ([place])    Alcohol use disorder, mild, abuse 09/18/2015   Cannabis use disorder, mild, abuse 09/18/2015   Opioid use disorder, moderate, dependence ([place]) 09/18/2015   Tobacco use disorder 09/18/2015   Cocaine use disorder, severe, dependence ([place]) 09/16/2015   Schizoaffective disorder, depressive type ([place])    Suicidal ideation 05/07/2015   Polysubstance dependence ([place])  05/07/2015   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[facility])    Chronic pain syndrome 03/11/2015   Pituitary macroadenoma ([place]) 03/11/2015   Chronic headache 01/02/2015   Status post transsphenoidal pituitary resection ([place]) 01/02/2015   Essential hypertension 10/21/2014   Morbid obesity with BMI of 40.0-44.9, adult ([place]) 10/21/2014   Sleep apnea    Arthritis    Diabetes type 2, uncontrolled    Adrenal insufficiency ([place])  12/23/2005   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[place]) 12/08/2005     Current Outpatient Medications on File Prior to Visit  Medication Sig Dispense Refill   amLODipine (NORVASC) 10 MG tablet Take 1 tablet (10 mg total) by mouth daily. 30 tablet 0   benztropine (COGENTIN) 0.5 MG tablet Take 1 tablet (0.5 mg total) by mouth 2 (two) times daily. 60 tablet 0   celecoxib (CELEBREX) 200 MG capsule Take 1 capsule (200 mg total) by mouth 2 (two) times daily. 60 capsule 1   chlorproMAZINE (THORAZINE) 25 MG tablet Take 3 tablets (75 mg total) by mouth 2 (two) times daily. 180 tablet 0   chlorproMAZINE (THORAZINE) 50 MG tablet Take 3 tablets (150 mg total) by mouth at bedtime. 90 tablet 0   gabapentin (NEURONTIN) 400 MG capsule Take 1 capsule (400 mg total) by mouth 3 (three) times daily. 90 capsule 1   hydrOXYzine (ATARAX) 25 MG tablet Take 1 tablet (25 mg total) by mouth every 6 (six) hours as needed for anxiety. 60 tablet 0   metFORMIN (GLUCOPHAGE) 500 MG tablet Take 1 tablet (500 mg total) by mouth 2 (two) times daily with a meal. 60 tablet 0   traZODone (DESYREL) 100 MG tablet Take 1 tablet (100 mg total) by mouth at bedtime as needed for sleep. 30 tablet 0   No current facility-administered medications on file prior to visit.    No Known Allergies  Social History   Socioeconomic History   Marital status: Single    Spouse name: Not on file   Number of children: 2   Years of education: GED   Highest education level: Not on file  Occupational History   Not on file  Tobacco Use   Smoking status: Every Day    Packs/day: 1.00    Years: 20.00    Total pack years: 20.00    Types: Cigarettes   Smokeless tobacco: Never   Tobacco comments:    Smoking .5 ppd  Substance and Sexual Activity   Alcohol use: Yes    Alcohol/week: 2.0 standard drinks of alcohol    Types: 2 Cans of beer per week    Comment: 2 cans on workdays; 6-pk on days off; varies   Drug use: Yes    Types: Cocaine, Marijuana, Heroin     Comment: Reports use 2 grams Cocaine   Sexual activity: Yes  Other Topics Concern   Not on file  Social History Narrative   Lives with mom.   Incarcerated for 22 months in [place], [place]. From 2014-09/2014   Social Determinants of Health   Financial Resource Strain: Not on file  Food Insecurity: Not on file  Transportation Needs: Not on file  Physical Activity: Not on file  Stress: Not on file  Social Connections: Not on file  Intimate Partner Violence: Not on file    Family History  Problem Relation Age of Onset   Hypertension Mother    Diabetes Mother    Heart Problems Mother    Cancer Maternal Uncle    Alcoholism Maternal Uncle  Cancer Maternal Grandmother    Heart disease Neg Hx     Past Surgical History:  Procedure Laterality Date   PITUITARY SURGERY  2005 & 2012   SKIN BIOPSY      ROS: Review of Systems Negative except as stated above  PHYSICAL EXAM: BP (!) 138/99   Pulse 77   Ht 6' 3.5" (1.918 m)   Wt 261 lb (118.4 kg)   SpO2 98%   BMI 32.19 kg/m   Physical Exam  General appearance - alert, well appearing, middle-aged African-American male and in no distress Mental status - normal mood, behavior, speech, dress, motor activity, and thought processes Neck - supple, no significant adenopathy Chest - clear to auscultation, no wheezes, rales or rhonchi, symmetric air entry Heart - normal rate, regular rhythm, normal S1, S2, no murmurs, rubs, clicks or gallops Extremities - peripheral pulses normal, no pedal edema, no clubbing or cyanosis      Latest Ref Rng & Units 03/23/2022   10:13 AM 01/09/2022   12:08 AM 12/08/2021    7:21 PM  CMP  Glucose 70 - 99 mg/dL  201  270   BUN 6 - 20 mg/dL  20  20   Creatinine 0.61 - 1.24 mg/dL  0.99  0.91   Sodium 135 - 145 mmol/L  134  137   Potassium 3.5 - 5.1 mmol/L  3.4  4.1   Chloride 98 - 111 mmol/L  104  108   CO2 22 - 32 mmol/L  21  18   Calcium 8.9 - 10.3 mg/dL  9.0  9.1   Total Protein 6.1 - 8.1 g/dL  7.0  7.4  7.7   Total Bilirubin 0.2 - 1.2 mg/dL 0.6  1.9  0.2   Alkaline Phos 38 - 126 U/L  53  48   AST 10 - 40 U/L 17  61  27   ALT 9 - 46 U/L 19  42  24    Lipid Panel     Component Value Date/Time   CHOL 126 01/09/2022 0008   CHOL 138 09/04/2021 1550   TRIG 45 01/09/2022 0008   HDL 52 01/09/2022 0008   HDL 40 09/04/2021 1550   CHOLHDL 2.4 01/09/2022 0008   VLDL 9 01/09/2022 0008   LDLCALC 65 01/09/2022 0008   LDLCALC 81 09/04/2021 1550    CBC    Component Value Date/Time   WBC 6.8 03/23/2022 1013   RBC 5.85 (H) 03/23/2022 1013   HGB 13.9 03/23/2022 1013   HGB 12.2 (L) 09/04/2021 1550   HCT 44.5 03/23/2022 1013   HCT 39.0 09/04/2021 1550   PLT 264 03/23/2022 1013   PLT 276 09/04/2021 1550   MCV 76.1 (L) 03/23/2022 1013   MCV 76 (L) 09/04/2021 1550   MCH 23.8 (L) 03/23/2022 1013   MCHC 31.2 (L) 03/23/2022 1013   RDW 14.2 03/23/2022 1013   RDW 14.4 09/04/2021 1550   LYMPHSABS 3.0 01/09/2022 0008   MONOABS 0.9 01/09/2022 0008   EOSABS 0.7 (H) 01/09/2022 0008   BASOSABS 0.1 01/09/2022 0008    ASSESSMENT AND PLAN: 1. Diabetes mellitus type 2 in obese (HCC) At goal. Continue metformin.  Refill sent to the pharmacy. Discussed on encourage healthy eating habits. - POCT glucose (manual entry) - metFORMIN (GLUCOPHAGE) 500 MG tablet; Take 1 tablet (500 mg total) by mouth 2 (two) times daily with a meal.  Dispense: 60 tablet; Refill: 5 - Ambulatory referral to Ophthalmology - Basic Metabolic Panel - Microalbumin /  creatinine urine ratio  2. Hypertension associated with diabetes ([place]) Not at goal but he has been out of amlodipine.  Refill sent to the pharmacy. - amLODipine (NORVASC) 10 MG tablet; Take 1 tablet (10 mg total) by mouth daily.  Dispense: 30 tablet; Refill: 6  3. Secondary adrenal insufficiency ([place]) Discussed the importance of getting in with an endocrinologist.  He does not wish to go back to [facility] due to the increased distance.  Advised that he update his  primary phone number at the front desk today before he leaves so that the specialist would be able to call to schedule the appointment. Restart prednisone. Recommend taking calcium 600 mg twice a day and vitamin D 400 mg daily while on prednisone. - Ambulatory referral to Endocrinology - predniSONE (DELTASONE) 5 MG tablet; Take 1 tablet (5 mg total) by mouth daily with breakfast.  Dispense: 30 tablet; Refill: 1  4. 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 Primary osteoarthritis of right hip Given Toradol shot today. - celecoxib (CELEBREX) 200 MG capsule; Take 1 capsule (200 mg total) by mouth daily.  Dispense: 30 capsule; Refill: 3 - ketorolac (TORADOL) injection 60 mg  6. Schizoaffective disorder, depressive type [facility]) We will refer him to behavioral health.  Patient also given printed information on behavioral health resources/providers in the [place] area.  I do not think he would get in with [facility] behavioral health now that he has insurance.  7. Chronic hepatitis C without hepatic coma (HCC) Followed by ID.  Awaiting instructions to start treatment.  8. Screening for colon cancer Discussed colon cancer screening.  Patient prefers to have colonoscopy.  Referral submitted. - Ambulatory referral to Gastroenterology      Patient was given the opportunity to ask questions.  Patient verbalized understanding of the plan and was able to repeat key elements of the plan.   This documentation was completed using Radio producer.  Any transcriptional errors are unintentional.  Orders Placed This Encounter  Procedures   POCT glucose (manual entry)     Requested Prescriptions    No prescriptions requested or ordered in this encounter    No follow-ups on file.  Karle Plumber, MD, FACP

## 2022-03-26 NOTE — Patient Instructions (Signed)
[facility] [address], [place], [place] [address] [phone] or [phone] Walk-in urgent care 24/7 for anyone  For [facility] ONLY New patient assessments and therapy walk-ins: Monday and Wednesday 8am-11am First and second Friday 1pm-5pm New patient psychiatry and medication management walk-ins: Mondays, Wednesdays, Thursdays, Fridays 8am-11am No psychiatry walk-ins on Tuesday   *Accepts all insurance and uninsured for Urgent Care needs *Accepts Medicaid and uninsured for outpatient treatment   [facility] (Therapy and psychiatry) [facility] (near [place]) [address] [place], [place] [address] [phone] Fax: [phone] ([place])   [facility] [place],  [place]  [address] [phone] Call for appointment  [facility] of the [place] (Therapy only)  [facility] [address], [place], [place] [address] Monday - Friday: 8:30 a.m.-12 p.m. / 1 p.m.-2:30 p.m.  [facility] [address] [place], [place] [address] Monday-Friday: 8:30 a.m.-12 p.m. / 2-3:30 p.m. (INSURANCE REQUIRED -MEDICAID ACCEPTED) They do offer a sliding fee scale $20-$30/session   [facility] [place], [place] [address] Phone: [place] [address] [place] [address]  Phone: [phone] (Does not accept Medicaid) (only one provider accepts Medicare) [facility] [address] (at [employer]) [place], [place] [address] (Accepts Medicaid and Medicare)  [facility]  [place] [address]  [place], [place] [address]  Phone: [phone]  [address] [place], [place] [address] Phone: [phone] [facility] Medicaid) [facility] (Therapy only)  [address] [place], [place] [address] Phone: [phone]   [facility] (Therapy only)  [place], [place] [address] Phone: [phone] [facility])   [place] [address] [place], [place] [address] Phone: [phone] ([place]) [employer] [phone] [address] [place], [place] [address] Phone: [phone] [facility]) [facility] (Psychiatry only)  [phone] [address], [place], [place] [address] (Accepts Medicaid and Medicare) [facility] (Psychiatry and therapy)  [place], [place], [place] [address] [phone] [facility] Medicare) [place] (psychiatry and therapy) [address], [place], [place] [address] [phone]  [facility] at [address], [place], [place], [place] [address] [phone] Accept [address] [place], [place], [place], [place],  and the following types of Medicaid; Alliance, [place], Partners, [place], [place], [employer], Healthy [place], [place] Complete, and [place], as well as offering a Manufacturing systems engineer and private payment options. Provides In-Office Appointments, Virtual Appointments, and Phone Consultations Offers medication management for ages 46 years old and up, including,  Medication Management for Suboxone and [place] [phone] [address], [place], [place] [address] ([place] Medicaid and Medicare)         19.  [facility] (therapy only)  [address] [place], [place] [address]            [phone] (Accepts medicare) 20. [facility]  (Suboxone and methodone) [phone] [address], [place], [place] [address] To Be Eligible for Opioid Treatment at [facility] you must be at least 48 years of age you have already tried other  interventions that were not successful such as opioid  detox, inpatient rehab for opioids, or outpatient counseling specifically for opioid dependency your [facility] drug test must be completely free of benzodiazepines (klonopin, xanax, valium, ativan, or other benz) you have reliable transportation to the [facility] clinic in [place] you recognize that counseling is a critical component of [facility]' Opioid Program and you agree to attend all required counseling sessions you are committed to total drug abstinence and will conscientiously strive to remain free of alcohol, marijuana, and other illicit substances while in treatment you desire a peaceful treatment atmosphere in which personal responsibility and respect toward staff and clients is the norm   21. [facility] [address], [place], [place] [address] Offers SAIOP (Substance Abuse Intensive Outpatient Program) [phone] 22. [facility] [address] [place], [place] [address] [phone] (Accepts medicare)  For those who are tech savvy, go on psychology today, type in your local city (i.e. [place]. [place]) and specify your insurance at the top of the screen after you search. (Medicaid if needed). You can also specify whether you are interested in therapy and psychiatry.  www.psychologytoday.com/us      

## 2022-03-27 LAB — BASIC METABOLIC PANEL
BUN/Creatinine Ratio: 14 (ref 9–20)
BUN: 11 mg/dL (ref 6–24)
CO2: 20 mmol/L (ref 20–29)
Calcium: 9.8 mg/dL (ref 8.7–10.2)
Chloride: 105 mmol/L (ref 96–106)
Creatinine, Ser: 0.77 mg/dL (ref 0.76–1.27)
Glucose: 129 mg/dL — ABNORMAL HIGH (ref 70–99)
Potassium: 4.1 mmol/L (ref 3.5–5.2)
Sodium: 141 mmol/L (ref 134–144)
eGFR: 111 mL/min/{1.73_m2} (ref >59)

## 2022-03-27 LAB — MICROALBUMIN / CREATININE URINE RATIO
Creatinine, Urine: 292.6 mg/dL
Microalb/Creat Ratio: 63 mg/g creat — ABNORMAL HIGH (ref 0–29)
Microalbumin, Urine: 185.4 ug/mL

## 2022-03-31 ENCOUNTER — Encounter: Payer: Self-pay | Admitting: Neurology

## 2022-03-31 ENCOUNTER — Ambulatory Visit: Payer: 59 | Admitting: Neurology

## 2022-03-31 DIAGNOSIS — Z029 Encounter for administrative examinations, unspecified: Secondary | ICD-10-CM

## 2022-04-01 ENCOUNTER — Other Ambulatory Visit: Payer: Self-pay

## 2022-04-02 ENCOUNTER — Emergency Department (HOSPITAL_COMMUNITY): Payer: 59

## 2022-04-02 ENCOUNTER — Other Ambulatory Visit: Payer: Self-pay

## 2022-04-02 ENCOUNTER — Emergency Department (HOSPITAL_COMMUNITY)
Admission: EM | Admit: 2022-04-02 | Discharge: 2022-04-02 | Disposition: A | Discharge status: Home / Self Care | Payer: 59 | Attending: Emergency Medicine | Admitting: Emergency Medicine

## 2022-04-02 DIAGNOSIS — Z7984 Long term (current) use of oral hypoglycemic drugs: Secondary | ICD-10-CM | POA: Diagnosis not present

## 2022-04-02 DIAGNOSIS — R519 Headache, unspecified: Secondary | ICD-10-CM | POA: Insufficient documentation

## 2022-04-02 DIAGNOSIS — G5621 Lesion of ulnar nerve, right upper limb: Secondary | ICD-10-CM | POA: Diagnosis not present

## 2022-04-02 DIAGNOSIS — Z79899 Other long term (current) drug therapy: Secondary | ICD-10-CM | POA: Insufficient documentation

## 2022-04-02 DIAGNOSIS — M25551 Pain in right hip: Secondary | ICD-10-CM | POA: Insufficient documentation

## 2022-04-02 DIAGNOSIS — I10 Essential (primary) hypertension: Secondary | ICD-10-CM | POA: Diagnosis not present

## 2022-04-02 DIAGNOSIS — E119 Type 2 diabetes mellitus without complications: Secondary | ICD-10-CM | POA: Insufficient documentation

## 2022-04-02 LAB — CBC WITH DIFFERENTIAL/PLATELET
Abs Immature Granulocytes: 0.01 10*3/uL (ref 0.00–0.07)
Basophils Absolute: 0.1 10*3/uL (ref 0.0–0.1)
Basophils Relative: 1 %
Eosinophils Absolute: 0.2 10*3/uL (ref 0.0–0.5)
Eosinophils Relative: 2 %
HCT: 43.8 % (ref 39.0–52.0)
Hemoglobin: 13.8 g/dL (ref 13.0–17.0)
Immature Granulocytes: 0 %
Lymphocytes Relative: 36 %
Lymphs Abs: 2.9 10*3/uL (ref 0.7–4.0)
MCH: 23.6 pg — ABNORMAL LOW (ref 26.0–34.0)
MCHC: 31.5 g/dL (ref 30.0–36.0)
MCV: 75 fL — ABNORMAL LOW (ref 80.0–100.0)
Monocytes Absolute: 0.8 10*3/uL (ref 0.1–1.0)
Monocytes Relative: 10 %
Neutro Abs: 4.1 10*3/uL (ref 1.7–7.7)
Neutrophils Relative %: 51 %
Platelets: 263 10*3/uL (ref 150–400)
RBC: 5.84 MIL/uL — ABNORMAL HIGH (ref 4.22–5.81)
RDW: 15.2 % (ref 11.5–15.5)
WBC: 8 10*3/uL (ref 4.0–10.5)
nRBC: 0 % (ref 0.0–0.2)

## 2022-04-02 LAB — URINALYSIS, ROUTINE W REFLEX MICROSCOPIC
Bacteria, UA: NONE SEEN
Bilirubin Urine: NEGATIVE
Glucose, UA: NEGATIVE mg/dL
Hgb urine dipstick: NEGATIVE
Ketones, ur: NEGATIVE mg/dL
Leukocytes,Ua: NEGATIVE
Nitrite: NEGATIVE
Protein, ur: 30 mg/dL — AB
Specific Gravity, Urine: 1.014 (ref 1.005–1.030)
pH: 5 (ref 5.0–8.0)

## 2022-04-02 LAB — BASIC METABOLIC PANEL
Anion gap: 13 (ref 5–15)
BUN: 9 mg/dL (ref 6–20)
CO2: 20 mmol/L — ABNORMAL LOW (ref 22–32)
Calcium: 9.2 mg/dL (ref 8.9–10.3)
Chloride: 105 mmol/L (ref 98–111)
Creatinine, Ser: 0.83 mg/dL (ref 0.61–1.24)
GFR, Estimated: >60 mL/min (ref >60)
Glucose, Bld: 79 mg/dL (ref 70–99)
Potassium: 3.8 mmol/L (ref 3.5–5.1)
Sodium: 138 mmol/L (ref 135–145)

## 2022-04-02 LAB — RAPID URINE DRUG SCREEN, HOSP PERFORMED
Amphetamines: NOT DETECTED
Barbiturates: NOT DETECTED
Benzodiazepines: NOT DETECTED
Cocaine: POSITIVE — AB
Opiates: NOT DETECTED
Tetrahydrocannabinol: POSITIVE — AB

## 2022-04-02 LAB — MAGNESIUM: Magnesium: 1.8 mg/dL (ref 1.7–2.4)

## 2022-04-02 MED ORDER — HYDROCODONE-ACETAMINOPHEN 5-325 MG PO TABS
1.0000 | ORAL_TABLET | Freq: Four times a day (QID) | ORAL | 0 refills | Status: AC | PRN
  Filled 2022-04-02: qty 8, 2d supply, fill #0

## 2022-04-02 MED ORDER — METHOCARBAMOL 500 MG PO TABS
750.0000 mg | ORAL_TABLET | Freq: Once | ORAL | Status: AC
  Administered 2022-04-02: 750 mg via ORAL
  Filled 2022-04-02: qty 2

## 2022-04-02 MED ORDER — ACETAMINOPHEN 325 MG PO TABS
650.0000 mg | ORAL_TABLET | Freq: Four times a day (QID) | ORAL | Status: DC | PRN
Start: 2022-04-02 — End: 2022-04-02

## 2022-04-02 MED ORDER — KETOROLAC TROMETHAMINE 30 MG/ML IJ SOLN
30.0000 mg | Freq: Once | INTRAMUSCULAR | Status: AC
  Administered 2022-04-02: 30 mg via INTRAMUSCULAR
  Filled 2022-04-02: qty 1

## 2022-04-02 NOTE — ED Notes (Signed)
Pt presents from the lobby with complaints of right finger numbness and right leg weakness. Pt states that he has been having numbness and weakness for months but it started getting worse the last few days.

## 2022-04-02 NOTE — ED Triage Notes (Signed)
[place] [facility] for R leg pain and decreased mobility in R hand. Pt reports pain is worse with exertion, because he has walked a lot recently. Pt reports hx of brain tumor and was supposed to get a nerve study done, and was told to return to hospital if pain in leg increased w/ decreased mobility in hand. 160/100.

## 2022-04-02 NOTE — ED Notes (Signed)
Transported to XR  

## 2022-04-02 NOTE — Discharge Instructions (Signed)
Please return to the ED with any new or worsening signs or symptoms Please follow-up with your PCP for further management Please follow-up with your orthopedic doctor, Dr. Erlinda Hong, for further management of your ulnar neuropathy Please begin taking pain medication I prescribed you only as needed.  Please only take these if your pain is very severe.

## 2022-04-02 NOTE — ED Provider Notes (Signed)
[facility] Provider Note   CSN: [id number] Arrival date & time: 04/02/22  5397     History  Chief Complaint  Patient presents with   Leg Pain    Duane Salazar is a 46 y.o. male with medical history of drug abuse, headache, schizophrenia, seizures, hypertension, arthritis, acromegaly, pituitary macroadenoma, type 2 diabetes.  Patient presents to ED for evaluation of right hip and right hand pain.  Patient reports that he has a history of osteoarthritis of his right hip.  The patient is being seen by Dr. Erlinda Hong of orthopedics as well for his pain in his right hand which has been found to be due to ulnar neuropathy.  Patient states that this morning he woke up with worsening of his right hand ulnar neuropathy as well as worsening of his right hip pain.  The patient states that he does not remember falling or injuring himself to exacerbate his injuries.  Patient reports that he is currently taking gabapentin, Celebrex for his ulnar neuropathy however he states that the pain persists and is not controlled on these medications.  Patient denies any fevers, nausea, vomiting, diarrhea, abdominal pain, numbness or tingling.   Leg Pain Associated symptoms: no fever        Home Medications Prior to Admission medications   Medication Sig Start Date End Date Taking? Authorizing Provider  HYDROcodone-acetaminophen (NORCO/VICODIN) 5-325 MG tablet Take 1 tablet by mouth every 6 (six) hours as needed for severe pain. 04/02/22  Yes Azucena Cecil, PA-C  amLODipine (NORVASC) 10 MG tablet Take 1 tablet (10 mg total) by mouth daily. 03/26/22   Ladell Pier, MD  benztropine (COGENTIN) 0.5 MG tablet Take 1 tablet (0.5 mg total) by mouth 2 (two) times daily. 12/14/21   Nicholes Rough, NP  celecoxib (CELEBREX) 200 MG capsule Take 1 capsule (200 mg total) by mouth daily. 03/26/22   Ladell Pier, MD  chlorproMAZINE (THORAZINE) 25 MG tablet Take 3 tablets (75 mg  total) by mouth 2 (two) times daily. 12/14/21   Nicholes Rough, NP  chlorproMAZINE (THORAZINE) 50 MG tablet Take 3 tablets (150 mg total) by mouth at bedtime. 12/14/21   Nicholes Rough, NP  gabapentin (NEURONTIN) 400 MG capsule Take 1 capsule (400 mg total) by mouth 3 (three) times daily. 02/22/22 04/23/22  Mayers, Cari S, PA-C  hydrOXYzine (ATARAX) 25 MG tablet Take 1 tablet (25 mg total) by mouth every 6 (six) hours as needed for anxiety. 12/14/21   Nicholes Rough, NP  metFORMIN (GLUCOPHAGE) 500 MG tablet Take 1 tablet (500 mg total) by mouth 2 (two) times daily with a meal. 03/26/22   Ladell Pier, MD  predniSONE (DELTASONE) 5 MG tablet Take 1 tablet (5 mg total) by mouth daily with breakfast. 03/26/22   Ladell Pier, MD  traZODone (DESYREL) 100 MG tablet Take 1 tablet (100 mg total) by mouth at bedtime as needed for sleep. 12/14/21   Nicholes Rough, NP      Allergies    Patient has no known allergies.    Review of Systems   Review of Systems  Constitutional:  Negative for fever.  Musculoskeletal:  Positive for arthralgias and myalgias.  All other systems reviewed and are negative.   Physical Exam Updated Vital Signs BP (!) 148/100 (BP Location: Right Arm)   Pulse 84   Temp 98 F (36.7 C) (Oral)   Resp 18   SpO2 100%  Physical Exam Vitals and nursing note reviewed.  Constitutional:  General: He is not in acute distress.    Appearance: Normal appearance. He is not ill-appearing, toxic-appearing or diaphoretic.  HENT:     Head: Normocephalic and atraumatic.     Nose: Nose normal. No congestion.     Mouth/Throat:     Mouth: Mucous membranes are moist.     Pharynx: Oropharynx is clear.  Eyes:     Extraocular Movements: Extraocular movements intact.     Conjunctiva/sclera: Conjunctivae normal.     Pupils: Pupils are equal, round, and reactive to light.  Cardiovascular:     Rate and Rhythm: Normal rate and regular rhythm.  Pulmonary:     Effort: Pulmonary effort is  normal.     Breath sounds: Normal breath sounds. No wheezing.  Abdominal:     General: Abdomen is flat. Bowel sounds are normal.     Palpations: Abdomen is soft.     Tenderness: There is no abdominal tenderness.  Musculoskeletal:     Cervical back: Normal range of motion and neck supple. No tenderness.     Comments: Nonfocal tenderness to right hip.  Patient has full range of motion to hip.  Pain is worsened with movement.  Skin:    General: Skin is warm and dry.     Capillary Refill: Capillary refill takes less than 2 seconds.  Neurological:     General: No focal deficit present.     Mental Status: He is alert and oriented to person, place, and time.     Comments: Decreased sensation to fifth and fourth fingers.     ED Results / Procedures / Treatments   Labs (all labs ordered are listed, but only abnormal results are displayed) Labs Reviewed  CBC WITH DIFFERENTIAL/PLATELET - Abnormal; Notable for the following components:      Result Value   RBC 5.84 (*)    MCV 75.0 (*)    MCH 23.6 (*)    All other components within normal limits  BASIC METABOLIC PANEL - Abnormal; Notable for the following components:   CO2 20 (*)    All other components within normal limits  URINALYSIS, ROUTINE W REFLEX MICROSCOPIC - Abnormal; Notable for the following components:   Protein, ur 30 (*)    All other components within normal limits  RAPID URINE DRUG SCREEN, HOSP PERFORMED - Abnormal; Notable for the following components:   Cocaine POSITIVE (*)    Tetrahydrocannabinol POSITIVE (*)    All other components within normal limits  MAGNESIUM    EKG None  Radiology CT Head Wo Contrast  Result Date: 04/02/2022 CLINICAL DATA:  Headache. EXAM: CT HEAD WITHOUT CONTRAST TECHNIQUE: Contiguous axial images were obtained from the base of the skull through the vertex without intravenous contrast. RADIATION DOSE REDUCTION: This exam was performed according to the departmental dose-optimization program  which includes automated exposure control, adjustment of the mA and/or kV according to patient size and/or use of iterative reconstruction technique. COMPARISON:  10/28/2021 FINDINGS: Brain: There is no evidence for acute hemorrhage, hydrocephalus, mass lesion, or abnormal extra-axial fluid collection. No definite CT evidence for acute infarction. Vascular: No hyperdense vessel or unexpected calcification. Skull: No evidence for fracture. No worrisome lytic or sclerotic lesion. Sinuses/Orbits: The visualized paranasal sinuses and mastoid air cells are clear. Visualized portions of the globes and intraorbital fat are unremarkable. Other: None. IMPRESSION: No acute intracranial abnormality. Electronically Signed   By: Misty Stanley M.D.   On: 04/02/2022 11:13   DG Hip Unilat W or Wo Pelvis 2-3 Views  Right  Result Date: 04/02/2022 CLINICAL DATA:  pain EXAM: DG HIP (WITH OR WITHOUT PELVIS) 2-3V RIGHT COMPARISON:  September 11, 2021 FINDINGS: There is no evidence of hip fracture or dislocation. As before, again seen is the loss of the joint space, subchondral sclerosis and lucencies. Lucencies are more prominent at the femoral head. Mild degenerative changes of the visualized left hip joint. Severe lumbar spondylosis with loss of the disc space, endplate osteophytes and endplate sclerosis at the L4-L5 and L5-S1 levels. IMPRESSION: Severe degenerative changes of the right hip joint without significant interval change. Severe lumbar spondylosis. Electronically Signed   By: Frazier Richards M.D.   On: 04/02/2022 10:05    Procedures Procedures   Medications Ordered in ED Medications  ketorolac (TORADOL) 30 MG/ML injection 30 mg (30 mg Intramuscular Given 04/02/22 1000)  methocarbamol (ROBAXIN) tablet 750 mg (750 mg Oral Given 04/02/22 1007)    ED Course/ Medical Decision Making/ A&P                           Medical Decision Making Amount and/or Complexity of Data Reviewed Labs: ordered. Radiology:  ordered.  Risk Prescription drug management.   46 year old male presents to the ED for evaluation.  Please see HPI for further details  On exam, the patient is afebrile and nontachycardic.  The patient lung sounds are clear bilaterally, he is not hypoxic.  Patient abdomen soft compressible all 4 quadrants.  Patient right hip has no overlying skin change, has intact range of motion however patient does have pain worsened with movement.  Patient has history of osteoarthritis, most likely cause of patient pain in right hip due to osteoarthritis.  Patient has a diagnosis of ulnar neuropathy and has decree sensation to fourth and fifth fingers of right hand.  Patient states he is taking gabapentin, Celebrex without relief of symptoms.  Patient requesting her chronic pain medication.  Patient also requesting TOC consult to assist with medication financial assistance as well as travel back home.  Patient treated with 30 mg Toradol, 750 Robaxin.  Patient worked up utilizing imaging of right hip which shows osteoarthritis.  Patient also had CT head done due to past history of pituitary macroadenoma.  CT head does not show any signs of increased macroadenoma, midline shift or herniation.  The patient was requesting narcotic pain medication due to pain.  Advised the patient that he would not be able to pick his narcotic pain medication up through [facility] consult as they do not help with narcotic pain medication.  The patient was also requesting assistance getting a ride home.  I have had TOC provide the patient with a taxi pass.  Patient encouraged to follow back up with his orthopedic doctor for further management.  Patient given return precautions and voiced understanding.  Patient safe for discharge.  Final Clinical Impression(s) / ED Diagnoses Final diagnoses:  Right hip pain  Ulnar neuropathy of right upper extremity    Rx / DC Orders ED Discharge Orders          Ordered     HYDROcodone-acetaminophen (NORCO/VICODIN) 5-325 MG tablet  Every 6 hours PRN        04/02/22 1350              Azucena Cecil, [place] 04/02/22 1359    Wynona Dove A, DO 04/08/22 2311

## 2022-04-05 ENCOUNTER — Other Ambulatory Visit: Payer: Self-pay

## 2022-04-06 ENCOUNTER — Other Ambulatory Visit: Payer: Self-pay

## 2022-04-09 ENCOUNTER — Other Ambulatory Visit: Payer: Self-pay

## 2022-04-09 ENCOUNTER — Encounter: Payer: Self-pay | Admitting: Physical Medicine and Rehabilitation

## 2022-04-09 ENCOUNTER — Ambulatory Visit: Payer: 59 | Admitting: Physical Medicine and Rehabilitation

## 2022-04-09 ENCOUNTER — Other Ambulatory Visit: Payer: Self-pay | Admitting: Physician Assistant

## 2022-04-09 ENCOUNTER — Telehealth: Payer: Self-pay | Admitting: Orthopaedic Surgery

## 2022-04-09 DIAGNOSIS — R202 Paresthesia of skin: Secondary | ICD-10-CM

## 2022-04-09 MED ORDER — TRAMADOL HCL 50 MG PO TABS
50.0000 mg | ORAL_TABLET | Freq: Two times a day (BID) | ORAL | 0 refills | Status: AC | PRN
  Filled 2022-04-09: qty 30, 15d supply, fill #0

## 2022-04-09 NOTE — Progress Notes (Unsigned)
Pt state pain and numbness in both hands. Pt state his can feel pain that from his wrist and thumb that shoot up to his fingers tips. Pt state he can straighten out his fingers. Pt state he has been dropping items and it hard for him to grip items. Pt state he takes over the counter pain meds to help ease his pain. Pt state he left handed.  Numeric Pain Rating Scale and Functional Assessment Average Pain 8   In the last MONTH (on 0-10 scale) has pain interfered with the following?  1. General activity like being  able to carry out your everyday physical activities such as walking, climbing stairs, carrying groceries, or moving a chair?  Rating(10)   -BT,

## 2022-04-09 NOTE — Telephone Encounter (Signed)
Tried calling pt to advise, no answer and no voice mail box

## 2022-04-09 NOTE — Telephone Encounter (Signed)
Patient came in today stating he was supposed to get pain medication (Tramadol) from [facility] from his last visit with [place] on 07/11 for right hip and knee he would like to know why he never received the medication please advise he would like a call at [phone]

## 2022-04-09 NOTE — Telephone Encounter (Signed)
Please advise 

## 2022-04-09 NOTE — Telephone Encounter (Signed)
Likely forgot to send in and I apologize.  I just sent in

## 2022-04-11 NOTE — Procedures (Unsigned)
EMG & NCV Findings: Evaluation of the left median motor and the right median motor nerves showed prolonged distal onset latency (L4.8, R4.7 ms) and decreased conduction velocity (Elbow-Wrist, L48, R44 m/s).  The left ulnar motor nerve showed reduced amplitude (2.0 mV) and decreased conduction velocity (B Elbow-Wrist, 40 m/s).  The right ulnar motor nerve showed prolonged distal onset latency (10.3 ms), reduced amplitude (0.1 mV), decreased conduction velocity (B Elbow-Wrist, 20 m/s), and decreased conduction velocity (A Elbow-B Elbow, 36 m/s).  The left median (across palm) sensory and the right median (across palm) sensory nerves showed no response (Palm) and prolonged distal peak latency (L4.8, R4.8 ms).  The left ulnar sensory and the right ulnar sensory nerves showed no response (Wrist).  All remaining nerves (as indicated in the following tables) were within normal limits.  Left vs. Right side comparison data for the ulnar motor nerve indicates abnormal L-R latency difference (6.2 ms), abnormal L-R amplitude difference (95.0 %), abnormal L-R velocity difference (B Elbow-Wrist, 20 m/s), and abnormal L-R velocity difference (A Elbow-B Elbow, 19 m/s).  All remaining left vs. right side differences were within normal limits.    Needle evaluation of the right first dorsal interosseous muscle showed decreased insertional activity, widespread spontaneous activity, decreased motor unit amplitude, diminished recruitment, and very decreased interference pattern.  The left first dorsal interosseous muscle showed decreased insertional activity, widespread spontaneous activity, and diminished recruitment.  All remaining muscles (as indicated in the following table) showed no evidence of electrical instability.    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   a moderate bilateral median nerve  entrapment at the wrist (carpal tunnel syndrome) affecting sensory and motor components. There is no significant electrodiagnostic evidence of any brachial plexopathy or cervical radiculopathy.   Recommendations: 1.  Follow-up with referring physician. 2.  Continue current management of symptoms. 3.  Suggest surgical evaluation.  ___________________________ Laurence Spates FAAPMR Board Certified, American Board of Physical Medicine and Rehabilitation    Nerve Conduction Studies Anti Sensory Summary Table   Stim Site NR Peak (ms) Norm Peak (ms) P-T Amp (V) Norm P-T Amp Site1 Site2 Delta-P (ms) Dist (cm) Vel (m/s) Norm Vel (m/s)  Left Median Acr Palm Anti Sensory (2nd Digit)  31.9C  Wrist    *4.8 <3.6 12.5 >10 Wrist Palm  0.0    Palm *NR  <2.0          Right Median Acr Palm Anti Sensory (2nd Digit)  30.8C  Wrist    *4.8 <3.6 12.0 >10 Wrist Palm  0.0    Palm *NR  <2.0          Left Radial Anti Sensory (Base 1st Digit)  31.3C  Wrist    2.5 <3.1 9.8  Wrist Base 1st Digit 2.5 0.0    Right Radial Anti Sensory (Base 1st Digit)  30.6C  Wrist    2.9 <3.1 13.8  Wrist Base 1st Digit 2.9 0.0    Left Ulnar Anti Sensory (5th Digit)  31.9C  Wrist *NR  <3.7  >15.0 Wrist 5th Digit  14.0  >38  Right Ulnar Anti Sensory (5th Digit)  30.9C  Wrist *NR  <3.7  >15.0 Wrist 5th Digit  14.0  >38   Motor Summary Table   Stim Site NR Onset (ms) Norm Onset (ms) O-P Amp (mV) Norm O-P Amp Site1 Site2 Delta-0 (ms) Dist (cm) Vel (m/s) Norm Vel (m/s)  Left Median Motor (Abd Poll Brev)  31.5C  Wrist    *4.8 <4.2 5.1 >5 Elbow Wrist 6.1 29.0 *48 >50  Elbow    10.9  4.7         Right Median Motor (Abd Poll Brev)  30C  Wrist    *4.7 <4.2 8.3 >5 Elbow Wrist 5.9 26.0 *44 >50  Elbow    10.6  7.9         Left Ulnar Motor (Abd Dig Min)  31.5C  Wrist    4.1 <4.2 *2.0 >3 B Elbow Wrist 5.9 23.5 *40 >53  B Elbow    10.0  1.7  A Elbow B Elbow 2.2 12.0 55 >53  A Elbow    12.2  1.8         Right Ulnar Motor (Abd Dig  Min)  30.1C  Wrist    *10.3 <4.2 *0.1 >3 B Elbow Wrist 12.0 24.5 *20 >53  B Elbow    22.3  0.1  A Elbow B Elbow 3.3 12.0 *36 >53  A Elbow    25.6  0.1          EMG   Side Muscle Nerve Root Ins Act Fibs Psw Amp Dur Poly Recrt Int Fraser Din Comment  Right 1stDorInt Ulnar C8-T1 *Decr *4+ *4+ *Decr Nml 0 *Reduced *25% no MUAPS  Left 1stDorInt Ulnar C8-T1 *Decr *4+ *4+ Nml Nml 0 *Reduced Nml   Left Abd Poll Brev Median C8-T1 Nml Nml Nml Nml Nml 0 Nml Nml   Left ExtDigCom   Nml Nml Nml Nml Nml 0 Nml Nml   Left Triceps Radial C6-7-8 Nml Nml Nml Nml Nml 0 Nml Nml   Left Deltoid Axillary C5-6 Nml Nml Nml Nml Nml 0 Nml Nml     Nerve Conduction Studies Anti Sensory Left/Right Comparison   Stim Site L Lat (ms) R Lat (ms) L-R Lat (ms) L Amp (V) R Amp (V) L-R Amp (%) Site1 Site2 L Vel (m/s) R Vel (m/s) L-R Vel (m/s)  Median Acr Palm Anti Sensory (2nd Digit)  31.9C  Wrist *4.8 *4.8 0.0 12.5 12.0 4.0 Wrist Palm     Palm             Radial Anti Sensory (Base 1st Digit)  31.3C  Wrist 2.5 2.9 0.4 9.8 13.8 29.0 Wrist Base 1st Digit     Ulnar Anti Sensory (5th Digit)  31.9C  Wrist       Wrist 5th Digit      Motor Left/Right Comparison   Stim Site L Lat (ms) R Lat (ms) L-R Lat (ms) L Amp (mV) R Amp (mV) L-R Amp (%) Site1 Site2 L Vel (m/s) R Vel (m/s) L-R Vel (m/s)  Median Motor (Abd Poll Brev)  31.5C  Wrist *4.8 *4.7 0.1 5.1 8.3 38.6 Elbow Wrist *48 *44 4  Elbow 10.9 10.6 0.3 4.7 7.9 40.5       Ulnar Motor (Abd Dig Min)  31.5C  Wrist 4.1 *10.3 *6.2 *2.0 *0.1 *95.0 B Elbow Wrist *40 *20 *20  B Elbow 10.0 22.3 12.3 1.7 0.1 94.1 A Elbow B Elbow 55 *36 *19  A Elbow 12.2 25.6 13.4 1.8 0.1 94.4          Waveforms:

## 2022-04-12 ENCOUNTER — Other Ambulatory Visit: Payer: Self-pay

## 2022-04-13 NOTE — Progress Notes (Signed)
Duane Salazar - 46 y.o. male MRN [id number]  Date of birth: September 23, 1975  Office Visit Note: Visit Date: 04/09/2022 PCP: Ladell Pier, MD Referred by: Leandrew Koyanagi, MD  Subjective: Chief Complaint  Patient presents with   Right Hand - Pain, Numbness   Left Hand - Pain, Numbness   Right Wrist - Pain, Numbness   Left Wrist - Pain, Numbness   HPI:  Duane Salazar is a 46 y.o. male who comes in today at the request of Dr. Eduard Roux for electrodiagnostic study of the Bilateral upper extremities.  Patient is Left hand dominant.  He reports just over a year of significant worsening pain numbness and weakness in both hands right much more than the left.  He has noted atrophy in the first webspace of both hands right more than left.  He denies any frank radicular symptoms down the arms but gets symptoms up the arms.  Most of the numbness and tingling is in the fourth and fifth digits bilaterally.  Worsening at night and now constant.  Really having difficulty using his hands for gripping.  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   ROS Otherwise per HPI.  Assessment & Plan: Visit Diagnoses:    ICD-10-CM   1. 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  Unfortunately very few if any motor unit action potentials on the left FDI.  This portends a poor outcome even with surgical decompression.  Given the severity of the lesion itself its hard to fully determine location although most likely at the elbow.  Also, a moderate bilateral median nerve entrapment at the wrist affecting sensory and motor components.  This may be asymptomatic released overshadowed by the ulnar nerve symptoms.   There is no significant electrodiagnostic evidence of any  brachial plexopathy or cervical radiculopathy.  This does not seem to be an underlying polyneuropathy although if needed a more extensive study at a tertiary care center could be considered.  Recommendations: 1.  Follow-up with referring physician. 2.  Continue current management of symptoms. 3.  Suggest surgical evaluation.  Meds & Orders: No orders of the defined types were placed in this encounter.   Orders Placed This Encounter  Procedures   NCV with EMG (electromyography)    Follow-up: Return in about 2 weeks (around 04/23/2022) for  Eduard Roux, MD.   Procedures: No procedures performed  EMG & NCV Findings: Evaluation of the left median motor and the right median motor nerves showed prolonged distal onset latency (L4.8, R4.7 ms) and decreased conduction velocity (Elbow-Wrist, L48, R44 m/s).  The left ulnar motor nerve showed reduced amplitude (2.0 mV) and decreased conduction velocity (B Elbow-Wrist, 40 m/s).  The right ulnar motor nerve showed prolonged distal onset latency (10.3 ms), reduced amplitude (0.1 mV), decreased conduction velocity (B Elbow-Wrist, 20 m/s), and decreased conduction velocity (A Elbow-B Elbow, 36 m/s).  The left median (across palm) sensory and the right median (across palm) sensory nerves showed no response (Palm) and prolonged distal peak latency (L4.8, R4.8 ms).  The left ulnar sensory and the right ulnar sensory nerves showed no response (Wrist).  All remaining nerves (as indicated in the following tables) were within normal limits.  Left vs. Right side comparison data for the ulnar motor nerve  indicates abnormal L-R latency difference (6.2 ms), abnormal L-R amplitude difference (95.0 %), abnormal L-R velocity difference (B Elbow-Wrist, 20 m/s), and abnormal L-R velocity difference (A Elbow-B Elbow, 19 m/s).  All remaining left vs. right side differences were within normal limits.    Needle evaluation of the right first dorsal interosseous muscle showed decreased  insertional activity, widespread spontaneous activity, decreased motor unit amplitude, diminished recruitment, and very decreased interference pattern.  The left first dorsal interosseous muscle showed decreased insertional activity, widespread spontaneous activity, and diminished recruitment.  All remaining muscles (as indicated in the following table) showed no evidence of electrical instability.    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  Unfortunately very few if any motor unit action potentials on the left FDI.  This portends a poor outcome even with surgical decompression.  Given the severity of the lesion itself its hard to fully determine location although most likely at the elbow.  Also, a moderate bilateral median nerve entrapment at the wrist affecting sensory and motor components.  This may be asymptomatic released overshadowed by the ulnar nerve symptoms.   There is no significant electrodiagnostic evidence of any brachial plexopathy or cervical radiculopathy.  This does not seem to be an underlying polyneuropathy although if needed a more extensive study at a tertiary care center could be considered.  Recommendations: 1.  Follow-up with referring physician. 2.  Continue current management of symptoms. 3.  Suggest surgical evaluation.  ___________________________ Laurence Spates FAAPMR Board Certified, American Board of Physical Medicine and Rehabilitation    Nerve Conduction Studies Anti Sensory Summary Table   Stim Site NR Peak (ms) Norm Peak (ms) P-T Amp (V) Norm P-T Amp Site1 Site2 Delta-P (ms) Dist (cm) Vel (m/s) Norm Vel (m/s)  Left Median Acr Palm Anti Sensory (2nd Digit)  31.9C  Wrist    *4.8 <3.6 12.5 >10 Wrist Palm  0.0    Palm *NR  <2.0          Right Median Acr Palm Anti Sensory (2nd Digit)  30.8C  Wrist    *4.8 <3.6 12.0 >10 Wrist Palm   0.0    Palm *NR  <2.0          Left Radial Anti Sensory (Base 1st Digit)  31.3C  Wrist    2.5 <3.1 9.8  Wrist Base 1st Digit 2.5 0.0    Right Radial Anti Sensory (Base 1st Digit)  30.6C  Wrist    2.9 <3.1 13.8  Wrist Base 1st Digit 2.9 0.0    Left Ulnar Anti Sensory (5th Digit)  31.9C  Wrist *NR  <3.7  >15.0 Wrist 5th Digit  14.0  >38  Right Ulnar Anti Sensory (5th Digit)  30.9C  Wrist *NR  <3.7  >15.0 Wrist 5th Digit  14.0  >38   Motor Summary Table   Stim Site NR Onset (ms) Norm Onset (ms) O-P Amp (mV) Norm O-P Amp Site1 Site2 Delta-0 (ms) Dist (cm) Vel (m/s) Norm Vel (m/s)  Left Median Motor (Abd Poll Brev)  31.5C  Wrist    *4.8 <4.2 5.1 >5 Elbow Wrist 6.1 29.0 *48 >50  Elbow    10.9  4.7         Right Median Motor (Abd Poll Brev)  30C  Wrist    *4.7 <4.2 8.3 >5 Elbow Wrist 5.9 26.0 *44 >50  Elbow    10.6  7.9  Left Ulnar Motor (Abd Dig Min)  31.5C  Wrist    4.1 <4.2 *2.0 >3 B Elbow Wrist 5.9 23.5 *40 >53  B Elbow    10.0  1.7  A Elbow B Elbow 2.2 12.0 55 >53  A Elbow    12.2  1.8         Right Ulnar Motor (Abd Dig Min)  30.1C  Wrist    *10.3 <4.2 *0.1 >3 B Elbow Wrist 12.0 24.5 *20 >53  B Elbow    22.3  0.1  A Elbow B Elbow 3.3 12.0 *36 >53  A Elbow    25.6  0.1          EMG   Side Muscle Nerve Root Ins Act Fibs Psw Amp Dur Poly Recrt Int Fraser Din Comment  Right 1stDorInt Ulnar C8-T1 *Decr *4+ *4+ *Decr Nml 0 *Reduced *25% no MUAPS  Left 1stDorInt Ulnar C8-T1 *Decr *4+ *4+ Nml Nml 0 *Reduced Nml   Left Abd Poll Brev Median C8-T1 Nml Nml Nml Nml Nml 0 Nml Nml   Left ExtDigCom   Nml Nml Nml Nml Nml 0 Nml Nml   Left Triceps Radial C6-7-8 Nml Nml Nml Nml Nml 0 Nml Nml   Left Deltoid Axillary C5-6 Nml Nml Nml Nml Nml 0 Nml Nml     Nerve Conduction Studies Anti Sensory Left/Right Comparison   Stim Site L Lat (ms) R Lat (ms) L-R Lat (ms) L Amp (V) R Amp (V) L-R Amp (%) Site1 Site2 L Vel (m/s) R Vel (m/s) L-R Vel (m/s)  Median Acr Palm Anti Sensory (2nd Digit)   31.9C  Wrist *4.8 *4.8 0.0 12.5 12.0 4.0 Wrist Palm     Palm             Radial Anti Sensory (Base 1st Digit)  31.3C  Wrist 2.5 2.9 0.4 9.8 13.8 29.0 Wrist Base 1st Digit     Ulnar Anti Sensory (5th Digit)  31.9C  Wrist       Wrist 5th Digit      Motor Left/Right Comparison   Stim Site L Lat (ms) R Lat (ms) L-R Lat (ms) L Amp (mV) R Amp (mV) L-R Amp (%) Site1 Site2 L Vel (m/s) R Vel (m/s) L-R Vel (m/s)  Median Motor (Abd Poll Brev)  31.5C  Wrist *4.8 *4.7 0.1 5.1 8.3 38.6 Elbow Wrist *48 *44 4  Elbow 10.9 10.6 0.3 4.7 7.9 40.5       Ulnar Motor (Abd Dig Min)  31.5C  Wrist 4.1 *10.3 *6.2 *2.0 *0.1 *95.0 B Elbow Wrist *40 *20 *20  B Elbow 10.0 22.3 12.3 1.7 0.1 94.1 A Elbow B Elbow 55 *36 *19  A Elbow 12.2 25.6 13.4 1.8 0.1 94.4          Waveforms:                      Clinical History: No specialty comments available.     Objective:  VS:  HT:    WT:   BMI:     BP:   HR: bpm  TEMP: ( )  RESP:  Physical Exam Musculoskeletal:        General: Tenderness present.     Comments: Inspection reveals atrophy of the bilateral FDI and hand intrinsics but no atrophy of the bilateral APB.  There is no swelling, color changes, allodynia or dystrophic changes. There is 5 out of 5 strength in the bilateral wrist extension and long finger flexion  but 3/5 right  finger abduction and 4/5 left.  There is decreased sensation to light touch in the ulnar nerve distribution on the right more than the left.. There is a positive Froment's test bilaterally. There is a negative Hoffmann's test bilaterally.  Skin:    General: Skin is warm and dry.     Findings: No erythema or rash.  Neurological:     General: No focal deficit present.     Mental Status: He is alert and oriented to person, place, and time.     Sensory: No sensory deficit.     Motor: No weakness or abnormal muscle tone.     Coordination: Coordination normal.     Gait: Gait abnormal.  Psychiatric:        Mood and  Affect: Mood normal.        Behavior: Behavior normal.        Thought Content: Thought content normal.      Imaging: No results found.

## 2022-04-20 ENCOUNTER — Emergency Department (HOSPITAL_COMMUNITY)
Admission: EM | Admit: 2022-04-20 | Discharge: 2022-04-20 | Disposition: A | Discharge status: Home / Self Care | Payer: 59 | Attending: Emergency Medicine | Admitting: Emergency Medicine

## 2022-04-20 ENCOUNTER — Ambulatory Visit: Payer: 59 | Admitting: Orthopaedic Surgery

## 2022-04-20 ENCOUNTER — Emergency Department (EMERGENCY_DEPARTMENT_HOSPITAL)
Admission: EM | Admit: 2022-04-20 | Discharge: 2022-04-21 | Disposition: A | Discharge status: Home / Self Care | Payer: 59 | Source: Home / Self Care | Attending: Emergency Medicine | Admitting: Emergency Medicine

## 2022-04-20 ENCOUNTER — Ambulatory Visit (HOSPITAL_COMMUNITY)
Admission: RE | Admit: 2022-04-20 | Discharge: 2022-04-20 | Disposition: A | Discharge status: Home / Self Care | Payer: 59 | Attending: Psychiatry | Admitting: Psychiatry

## 2022-04-20 DIAGNOSIS — F17210 Nicotine dependence, cigarettes, uncomplicated: Secondary | ICD-10-CM | POA: Insufficient documentation

## 2022-04-20 DIAGNOSIS — E119 Type 2 diabetes mellitus without complications: Secondary | ICD-10-CM | POA: Insufficient documentation

## 2022-04-20 DIAGNOSIS — Z79899 Other long term (current) drug therapy: Secondary | ICD-10-CM | POA: Insufficient documentation

## 2022-04-20 DIAGNOSIS — F19188 Other psychoactive substance abuse with other psychoactive substance-induced disorder: Secondary | ICD-10-CM | POA: Insufficient documentation

## 2022-04-20 DIAGNOSIS — Z20822 Contact with and (suspected) exposure to covid-19: Secondary | ICD-10-CM | POA: Insufficient documentation

## 2022-04-20 DIAGNOSIS — R45850 Homicidal ideations: Secondary | ICD-10-CM | POA: Insufficient documentation

## 2022-04-20 DIAGNOSIS — F1910 Other psychoactive substance abuse, uncomplicated: Secondary | ICD-10-CM

## 2022-04-20 DIAGNOSIS — Z7984 Long term (current) use of oral hypoglycemic drugs: Secondary | ICD-10-CM | POA: Insufficient documentation

## 2022-04-20 DIAGNOSIS — F1994 Other psychoactive substance use, unspecified with psychoactive substance-induced mood disorder: Secondary | ICD-10-CM | POA: Diagnosis present

## 2022-04-20 DIAGNOSIS — I10 Essential (primary) hypertension: Secondary | ICD-10-CM | POA: Insufficient documentation

## 2022-04-20 DIAGNOSIS — R45851 Suicidal ideations: Secondary | ICD-10-CM

## 2022-04-20 DIAGNOSIS — F259 Schizoaffective disorder, unspecified: Secondary | ICD-10-CM | POA: Diagnosis present

## 2022-04-20 DIAGNOSIS — F1490 Cocaine use, unspecified, uncomplicated: Secondary | ICD-10-CM

## 2022-04-20 LAB — BASIC METABOLIC PANEL
Anion gap: 5 (ref 5–15)
BUN: 16 mg/dL (ref 6–20)
CO2: 24 mmol/L (ref 22–32)
Calcium: 9 mg/dL (ref 8.9–10.3)
Chloride: 109 mmol/L (ref 98–111)
Creatinine, Ser: 0.84 mg/dL (ref 0.61–1.24)
GFR, Estimated: >60 mL/min (ref >60)
Glucose, Bld: 164 mg/dL — ABNORMAL HIGH (ref 70–99)
Potassium: 4.1 mmol/L (ref 3.5–5.1)
Sodium: 138 mmol/L (ref 135–145)

## 2022-04-20 LAB — CBC WITH DIFFERENTIAL/PLATELET
Abs Immature Granulocytes: 0.01 10*3/uL (ref 0.00–0.07)
Basophils Absolute: 0.1 10*3/uL (ref 0.0–0.1)
Basophils Relative: 1 %
Eosinophils Absolute: 0.3 10*3/uL (ref 0.0–0.5)
Eosinophils Relative: 5 %
HCT: 42.1 % (ref 39.0–52.0)
Hemoglobin: 13.3 g/dL (ref 13.0–17.0)
Immature Granulocytes: 0 %
Lymphocytes Relative: 44 %
Lymphs Abs: 3 10*3/uL (ref 0.7–4.0)
MCH: 23.9 pg — ABNORMAL LOW (ref 26.0–34.0)
MCHC: 31.6 g/dL (ref 30.0–36.0)
MCV: 75.6 fL — ABNORMAL LOW (ref 80.0–100.0)
Monocytes Absolute: 0.8 10*3/uL (ref 0.1–1.0)
Monocytes Relative: 13 %
Neutro Abs: 2.5 10*3/uL (ref 1.7–7.7)
Neutrophils Relative %: 37 %
Platelets: 221 10*3/uL (ref 150–400)
RBC: 5.57 MIL/uL (ref 4.22–5.81)
RDW: 15.7 % — ABNORMAL HIGH (ref 11.5–15.5)
WBC: 6.7 10*3/uL (ref 4.0–10.5)
nRBC: 0 % (ref 0.0–0.2)

## 2022-04-20 LAB — RESP PANEL BY RT-PCR (FLU A&B, COVID) ARPGX2
Influenza A by PCR: NEGATIVE
Influenza B by PCR: NEGATIVE
SARS Coronavirus 2 by RT PCR: NEGATIVE

## 2022-04-20 LAB — ACETAMINOPHEN LEVEL: Acetaminophen (Tylenol), Serum: <10 ug/mL — ABNORMAL LOW (ref 10–30)

## 2022-04-20 LAB — SALICYLATE LEVEL: Salicylate Lvl: <7 mg/dL — ABNORMAL LOW (ref 7.0–30.0)

## 2022-04-20 MED ORDER — STERILE WATER FOR INJECTION IJ SOLN
INTRAMUSCULAR | Status: AC
  Administered 2022-04-20: 1.2 mL
  Filled 2022-04-20: qty 10

## 2022-04-20 MED ORDER — DIPHENHYDRAMINE HCL 50 MG/ML IJ SOLN
25.0000 mg | Freq: Once | INTRAMUSCULAR | Status: DC
  Filled 2022-04-20: qty 1

## 2022-04-20 MED ORDER — ZIPRASIDONE MESYLATE 20 MG IM SOLR
20.0000 mg | Freq: Once | INTRAMUSCULAR | Status: AC
  Administered 2022-04-20: 20 mg via INTRAMUSCULAR
  Filled 2022-04-20: qty 20

## 2022-04-20 MED ORDER — AMLODIPINE BESYLATE 5 MG PO TABS
10.0000 mg | ORAL_TABLET | Freq: Every day | ORAL | Status: DC
  Administered 2022-04-20 – 2022-04-21 (×2): 10 mg via ORAL
  Filled 2022-04-20 (×2): qty 2

## 2022-04-20 MED ORDER — METFORMIN HCL 500 MG PO TABS
500.0000 mg | ORAL_TABLET | Freq: Two times a day (BID) | ORAL | Status: DC
  Administered 2022-04-20 – 2022-04-21 (×2): 500 mg via ORAL
  Filled 2022-04-20 (×2): qty 1

## 2022-04-20 MED ORDER — BENZTROPINE MESYLATE 0.5 MG PO TABS
0.5000 mg | ORAL_TABLET | Freq: Two times a day (BID) | ORAL | Status: DC
  Administered 2022-04-20 – 2022-04-21 (×3): 0.5 mg via ORAL
  Filled 2022-04-20 (×3): qty 1

## 2022-04-20 MED ORDER — TRAZODONE HCL 100 MG PO TABS
100.0000 mg | ORAL_TABLET | Freq: Every day | ORAL | Status: DC
  Administered 2022-04-20: 100 mg via ORAL
  Filled 2022-04-20: qty 1

## 2022-04-20 MED ORDER — CHLORPROMAZINE HCL 25 MG PO TABS
50.0000 mg | ORAL_TABLET | Freq: Three times a day (TID) | ORAL | Status: DC
  Administered 2022-04-20: 50 mg via ORAL
  Filled 2022-04-20: qty 2

## 2022-04-20 MED ORDER — TRAZODONE HCL 100 MG PO TABS: 100.0000 mg | ORAL_TABLET | Freq: Every evening | ORAL | Status: DC | PRN

## 2022-04-20 MED ORDER — CHLORPROMAZINE HCL 25 MG PO TABS
150.0000 mg | ORAL_TABLET | Freq: Every day | ORAL | Status: DC
  Administered 2022-04-20: 150 mg via ORAL
  Filled 2022-04-20: qty 2

## 2022-04-20 MED ORDER — CHLORPROMAZINE HCL 25 MG PO TABS
50.0000 mg | ORAL_TABLET | Freq: Two times a day (BID) | ORAL | Status: DC
  Administered 2022-04-20 – 2022-04-21 (×2): 50 mg via ORAL
  Filled 2022-04-20 (×2): qty 2

## 2022-04-20 MED ORDER — HYDROXYZINE HCL 25 MG PO TABS
25.0000 mg | ORAL_TABLET | Freq: Three times a day (TID) | ORAL | Status: DC | PRN
  Administered 2022-04-20: 25 mg via ORAL
  Filled 2022-04-20: qty 1

## 2022-04-20 MED ORDER — BENZTROPINE MESYLATE 0.5 MG PO TABS: 0.5000 mg | ORAL_TABLET | Freq: Every morning | ORAL | Status: DC

## 2022-04-20 NOTE — Consult Note (Cosign Needed Addendum)
[facility] ED ASSESSMENT   Reason for Consult:  Psychiatry evaluation Referring Physician:  ER Physician Patient Identification: Duane Salazar MRN:  [id number] ED Chief Complaint: Substance induced mood disorder ([place])  Diagnosis:  Principal Problem:   Substance induced mood disorder ([place]) Active Problems:   Schizoaffective disorder [facility])   ED Assessment Time Calculation: No data recorded  Subjective:   Duane Salazar is a 46 y.o. male patient admitted with suicide ideation and Homicidal ideation using a gun.  Patient reported he has a gun. Patient flagged down  Police officer stating he he was thinking of doing something "Violent or harming himself"  Patient became aggressive and agitated in the ER and was IVC by the ER Physician.Duane Salazar  HPI:  This morning patient was seen by this provider and patient asked to be discharged.  He stated that he is still feeling suicidal and homicidal but will not act on it.  Now he stated he has no gun although Gun is easy to obtain he added.  He is difficult to redirect with [employer] officers at the bed side.  Patient's UDS is not done yet but he reported he uses Marijuana and Cocaine off and on.  Patient was hospitalized at [facility] back in April and was supposed to follow up with [place] clinic.  Patient reported that he used to get Monthly injection but same was stopped and he is supposed to take Thorazine.  He stopped that too.  This morning he want to leave unannounced to go to [facility] to resume injection.  Patient was informed he is under Involuntary commitment and can not leave until he gets treated for disruptive mood.  We will resume home medications, seek inpatient hospitalization.  Meanwhile patient is given Geodon to treat agitation  Past Psychiatric History: Hx of Schizoaffective disorder, Schizophrenia, Polysubstance abuse.  Multiple inpatient Psychiatric Hospitalizations.  Last admission at [facility] in April.  Multiple ER Visits  and [place] visits as well.  Risk to Self or Others: Is the patient at risk to self? Yes Has the patient been a risk to self in the past 6 months? Yes Has the patient been a risk to self within the distant past? Yes Is the patient a risk to others? Yes Has the patient been a risk to others in the past 6 months? Yes Has the patient been a risk to others within the distant past? Yes  [place] Scale:  [place] ED from 04/20/2022 in [place] DEPT Most recent reading at 04/20/2022  5:34 AM ED from 04/20/2022 in [place] DEPT Most recent reading at 04/20/2022  4:54 AM ED from 04/02/2022 in [place] Most recent reading at 04/02/2022  6:43 AM  C-SSRS RISK CATEGORY High Risk High Risk No Risk       AIMS:  , , ,  ,   ASAM:    Substance Abuse:     Past Medical History:  Past Medical History:  Diagnosis Date   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[place]) 2004   AKI (acute kidney injury) ([place]) 03/21/2015   Arthritis Dx 2002   Diabetes type 2, controlled ([place]) 2010   Drug abuse ([place])    Headache    Hypertension Dx 2002   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   Schizo affective schizophrenia ([place]) 1995   Schizophrenia ([place])    Seizures ([place])    Sleep apnea 1995   on CPAP    Past Surgical History:  Procedure Laterality  Date   PITUITARY SURGERY  2005 & 2012   SKIN BIOPSY     Family History:  Family History  Problem Relation Age of Onset   Hypertension Mother    Diabetes Mother    Heart Problems Mother    Cancer Maternal Uncle    Alcoholism Maternal Uncle    Cancer Maternal Grandmother    Heart disease Neg Hx    Family Psychiatric  History: Maternal uncle -Alcoholic Social History:  Social History   Substance and Sexual Activity  Alcohol Use Yes   Alcohol/week: 2.0 standard drinks of alcohol   Types: 2 Cans of beer per week   Comment: 2 cans on workdays; 6-pk on days off; varies     Social History    Substance and Sexual Activity  Drug Use Yes   Types: Cocaine, Marijuana, Heroin   Comment: Reports use 2 grams Cocaine    Social History   Socioeconomic History   Marital status: Single    Spouse name: Not on file   Number of children: 2   Years of education: GED   Highest education level: Not on file  Occupational History   Not on file  Tobacco Use   Smoking status: Every Day    Packs/day: 1.00    Years: 20.00    Total pack years: 20.00    Types: Cigarettes   Smokeless tobacco: Never   Tobacco comments:    Smoking .5 ppd  Substance and Sexual Activity   Alcohol use: Yes    Alcohol/week: 2.0 standard drinks of alcohol    Types: 2 Cans of beer per week    Comment: 2 cans on workdays; 6-pk on days off; varies   Drug use: Yes    Types: Cocaine, Marijuana, Heroin    Comment: Reports use 2 grams Cocaine   Sexual activity: Yes  Other Topics Concern   Not on file  Social History Narrative   Lives with mom.   Incarcerated for 22 months in [place], [place]. From 2014-09/2014   Social Determinants of Health   Financial Resource Strain: Not on file  Food Insecurity: Not on file  Transportation Needs: Not on file  Physical Activity: Not on file  Stress: Not on file  Social Connections: Not on file   Additional Social History:    Allergies:  No Known Allergies  Labs: No results found for this or any previous visit (from the past 48 hour(s)).  Current Facility-Administered Medications  Medication Dose Route Frequency Provider Last Rate Last Admin   benztropine (COGENTIN) tablet 0.5 mg  0.5 mg Oral BID Charmaine Downs C, NP       chlorproMAZINE (THORAZINE) tablet 50 mg  50 mg Oral TID Charmaine Downs C, NP       diphenhydrAMINE (BENADRYL) injection 25 mg  25 mg Intramuscular Once Charmaine Downs C, NP       traZODone (DESYREL) tablet 100 mg  100 mg Oral QHS Charmaine Downs C, NP       Current Outpatient Medications  Medication Sig Dispense Refill   amLODipine  (NORVASC) 10 MG tablet Take 1 tablet (10 mg total) by mouth daily. 30 tablet 6   benztropine (COGENTIN) 0.5 MG tablet Take 1 tablet (0.5 mg total) by mouth 2 (two) times daily. 60 tablet 0   celecoxib (CELEBREX) 200 MG capsule Take 1 capsule (200 mg total) by mouth daily. 30 capsule 3   chlorproMAZINE (THORAZINE) 25 MG tablet Take 3 tablets (75 mg total) by mouth 2 (two)  times daily. 180 tablet 0   chlorproMAZINE (THORAZINE) 50 MG tablet Take 3 tablets (150 mg total) by mouth at bedtime. 90 tablet 0   gabapentin (NEURONTIN) 400 MG capsule Take 1 capsule (400 mg total) by mouth 3 (three) times daily. 90 capsule 1   HYDROcodone-acetaminophen (NORCO/VICODIN) 5-325 MG tablet Take 1 tablet by mouth every 6 (six) hours as needed for severe pain. 8 tablet 0   hydrOXYzine (ATARAX) 25 MG tablet Take 1 tablet (25 mg total) by mouth every 6 (six) hours as needed for anxiety. 60 tablet 0   metFORMIN (GLUCOPHAGE) 500 MG tablet Take 1 tablet (500 mg total) by mouth 2 (two) times daily with a meal. 60 tablet 5   predniSONE (DELTASONE) 5 MG tablet Take 1 tablet (5 mg total) by mouth daily with breakfast. 30 tablet 1   traMADol (ULTRAM) 50 MG tablet Take 1 tablet (50 mg total) by mouth every 12 (twelve) hours as needed. 30 tablet 0   traZODone (DESYREL) 100 MG tablet Take 1 tablet (100 mg total) by mouth at bedtime as needed for sleep. 30 tablet 0    Musculoskeletal: Strength & Muscle Tone: within normal limits Gait & Station: normal Patient leans: Front   Psychiatric Specialty Exam: Presentation  General Appearance: Casual; Neat  Eye Contact:Good  Speech:Clear and Coherent; Pressured  Speech Volume:Increased  Handedness:Right   Mood and Affect  Mood:Angry; Irritable; Labile  Affect:Congruent; Labile; Full Range   Thought Process  Thought Processes:Coherent; Goal Directed  Descriptions of Associations:Intact  Orientation:Full (Time, Place and Person)  Thought Content:Logical  History  of Schizophrenia/Schizoaffective disorder:No  Duration of Psychotic Symptoms:N/A  Hallucinations:Hallucinations: None  Ideas of Reference:None  Suicidal Thoughts:Suicidal Thoughts: Yes, Passive SI Passive Intent and/or Plan: -- (states he is not going to act on his suicidal ideation.)  Homicidal Thoughts:Homicidal Thoughts: Yes, Passive HI Passive Intent and/or Plan: -- (not going to act on it.)   Sensorium  Memory:Immediate Good; Recent Good; Remote Good  Judgment:Poor  Insight:Fair   Executive Functions  Concentration:Poor  Attention Span:Poor  [place]  Language:Other (comment) (UTA)   Psychomotor Activity  Psychomotor Activity:Psychomotor Activity: Restlessness   Assets  Assets:Communication Skills    Sleep  Sleep:Sleep: Fair   Physical Exam:  Unable to assess, patient agitated and needing Geodon Physical Exam ROS-Unable to engage in assessment. Blood pressure (!) 147/99, pulse 97, temperature 97.6 F (36.4 C), temperature source Oral, resp. rate 20, height 6' 3.5" (1.918 m), SpO2 98 %. Body mass index is 32.19 kg/m.  Medical Decision Making: Patient meets criteria for inpatient treatment for threatening suicide and homicidal ideations with plan to use gun.  He also stated he has a gun and also getting a gun is easy for him.  For his safety and safety of people around him we will seek inpatient Mental health treatment  Problem 1: Schizoaffective disorder, Depressed type  Problem 2: Cannabis use disorder, moderate use  Problem 3: Cocaine use disorder, unspecified use.  Disposition:  Admit, seek placement.  Delfin Gant, NP-PMHNP-BC 04/20/2022 10:44 AM

## 2022-04-20 NOTE — ED Provider Notes (Signed)
Emergency Medicine Observation Re-evaluation Note  Duane Salazar is a 46 y.o. male, seen on rounds today.  Pt initially presented to the ED for complaints of Suicidal Currently, the patient is resting.  He presented loc with si and is under ivc.  Physical Exam  BP (!) 146/103 (BP Location: Left Arm)   Pulse 76   Temp 97.7 F (36.5 C) (Oral)   Resp 18   Ht 1.918 m (6' 3.5")   SpO2 100%   BMI 32.19 kg/m  Physical Exam General: wdwn Cardiac: rrr Lungs:  normal respirations Psych: patient was agitated and required geodon, now resting  ED Course / MDM  EKG:   I have reviewed the labs performed to date as well as medications administered while in observation.  Recent changes in the last 24 hours include medical clearance.  Plan  Current plan is for [facility] inpatient plaement Duane Salazar is under involuntary commitment.      Pattricia Boss, MD 04/20/22 [phone]

## 2022-04-20 NOTE — BH Assessment (Cosign Needed)
Pt brought in by [employer] to the [facility]. Pt is yelling, aggressive, violent and banging on the glass doors, and heard saying "they are gonna take me to jail, damn, I'm not gonna make it, let me out, now".   Unable to speak to or redirect patient. [employer] reports patient was brought in from [facility].   I spoke with Dr Tyrone Nine at [facility], who reports patient was seen at the ED and sent over to the [facility] for psychiatric evaluation. Dr Tyrone Nine is informed of the patient's current presentation, and he agrees for the patient to be brought back to the ED.  Pt is transferred back to [facility] by [employer] due to needing higher level of care at this time as there are no appropriate beds available at the [facility].

## 2022-04-20 NOTE — ED Notes (Signed)
Pt changed into purple scrubs and belongings placed in hospital bag. Pt has one bag labeled and located in nurse's station 9-12 cabinet.

## 2022-04-20 NOTE — ED Notes (Signed)
Pt seen talking on a cell phone that this writer was unaware pt had on his person. After talking on the phone, pt came to desk asking for clothing so he could leave. Pt informed he was under IVC and could not have clothing. Pt stated 'I brought myself here' and despite repeated explanation of IVC status, pt began walking to Network engineer desk asking for belongings and to speak to a dr. Deirdre Evener called to hallway and attempted to direct pt back to bed. Dr Jeanell Sparrow informed and geodon injection ordered. [place] NP at bedside to speak to pt before administration, also informed pt of IVC status. Eventually pt agreed to take injection.

## 2022-04-20 NOTE — ED Provider Notes (Signed)
[facility] DEPT Provider Note   CSN: [id number] Arrival date & time: 04/20/22  6295     History  Chief Complaint  Patient presents with   Suicidal    Duane Salazar is a 46 y.o. male.  46 yo M here with a chief complaints of suicidal and homicidal ideation.  Patient was actually just seen in the ED medically cleared and went to be [facility].  Reportedly there he was too violent to stay there and was sent back here for evaluation.        Home Medications Prior to Admission medications   Medication Sig Start Date End Date Taking? Authorizing Provider  amLODipine (NORVASC) 10 MG tablet Take 1 tablet (10 mg total) by mouth daily. 03/26/22   Ladell Pier, MD  benztropine (COGENTIN) 0.5 MG tablet Take 1 tablet (0.5 mg total) by mouth 2 (two) times daily. 12/14/21   Nicholes Rough, NP  celecoxib (CELEBREX) 200 MG capsule Take 1 capsule (200 mg total) by mouth daily. 03/26/22   Ladell Pier, MD  chlorproMAZINE (THORAZINE) 25 MG tablet Take 3 tablets (75 mg total) by mouth 2 (two) times daily. 12/14/21   Nicholes Rough, NP  chlorproMAZINE (THORAZINE) 50 MG tablet Take 3 tablets (150 mg total) by mouth at bedtime. 12/14/21   Nicholes Rough, NP  gabapentin (NEURONTIN) 400 MG capsule Take 1 capsule (400 mg total) by mouth 3 (three) times daily. 02/22/22 05/06/22  Mayers, Cari S, PA-C  HYDROcodone-acetaminophen (NORCO/VICODIN) 5-325 MG tablet Take 1 tablet by mouth every 6 (six) hours as needed for severe pain. 04/02/22   Azucena Cecil, PA-C  hydrOXYzine (ATARAX) 25 MG tablet Take 1 tablet (25 mg total) by mouth every 6 (six) hours as needed for anxiety. 12/14/21   Nicholes Rough, NP  metFORMIN (GLUCOPHAGE) 500 MG tablet Take 1 tablet (500 mg total) by mouth 2 (two) times daily with a meal. 03/26/22   Ladell Pier, MD  predniSONE (DELTASONE) 5 MG tablet Take 1 tablet (5 mg total) by mouth daily with breakfast. 03/26/22   Ladell Pier, MD  traMADol  (ULTRAM) 50 MG tablet Take 1 tablet (50 mg total) by mouth every 12 (twelve) hours as needed. 04/09/22   Aundra Dubin, PA-C  traZODone (DESYREL) 100 MG tablet Take 1 tablet (100 mg total) by mouth at bedtime as needed for sleep. 12/14/21   Nicholes Rough, NP      Allergies    Patient has no known allergies.    Review of Systems   Review of Systems  Physical Exam Updated Vital Signs BP (!) 147/99 (BP Location: Right Arm)   Pulse 97   Temp 97.6 F (36.4 C) (Oral)   Resp 20   Ht 6' 3.5" (1.918 m)   SpO2 98%   BMI 32.19 kg/m  Physical Exam Vitals and nursing note reviewed.  Constitutional:      Appearance: He is well-developed.  HENT:     Head: Normocephalic and atraumatic.  Eyes:     Pupils: Pupils are equal, round, and reactive to light.  Neck:     Vascular: No JVD.  Cardiovascular:     Rate and Rhythm: Normal rate and regular rhythm.     Heart sounds: No murmur heard.    No friction rub. No gallop.  Pulmonary:     Effort: No respiratory distress.     Breath sounds: No wheezing.  Abdominal:     General: There is no distension.  Tenderness: There is no abdominal tenderness. There is no guarding or rebound.  Musculoskeletal:        General: Normal range of motion.     Cervical back: Normal range of motion and neck supple.  Skin:    Coloration: Skin is not pale.     Findings: No rash.  Neurological:     Mental Status: He is alert and oriented to person, place, and time.  Psychiatric:        Behavior: Behavior normal.     ED Results / Procedures / Treatments   Labs (all labs ordered are listed, but only abnormal results are displayed) Labs Reviewed  RESP PANEL BY RT-PCR (FLU A&B, COVID) [id number]    EKG None  Radiology No results found.  Procedures Procedures    Medications Ordered in ED Medications - No data to display  ED Course/ Medical Decision Making/ A&P                           Medical Decision Making  46 yo M with a significant past  medical history of acromegaly and has significant mental illness supposed be on Thorazine but not taking it at home.  Had waved down police and told them that he was suicidal and homicidal and they brought him here initially.  He was medically cleared and then sent to behavioral health urgent care, there they felt he was to violence and was sent back here.  Again I feel he is medically clear for TTS evaluation.  IVC paperwork filled out.   The patients results and plan were reviewed and discussed.   Any x-rays performed were independently reviewed by myself.   Differential diagnosis were considered with the presenting HPI.  Medications - No data to display  Vitals:   04/20/22 0534 04/20/22 0547  BP:  (!) 147/99  Pulse:  97  Resp:  20  Temp:  97.6 F (36.4 C)  TempSrc:  Oral  SpO2:  98%  Height: 6' 3.5" (1.918 m)     Final diagnoses:  Suicidal ideation  Polysubstance abuse ([place])          Final Clinical Impression(s) / ED Diagnoses Final diagnoses:  Suicidal ideation  Polysubstance abuse [facility])    Rx / DC Orders ED Discharge Orders     None         Deno Etienne, DO 04/20/22 6759

## 2022-04-20 NOTE — ED Triage Notes (Signed)
Pt sent back from [facility]. Prior note:  Pt arrives with PD, flagged them down asking for help, stated he wanted help because he was 'thinking about doing something violent' or harming himself. Pt is concerned about going back to jail. Hx acromegaly, DM, htn. States he has been taking thorazine with no effect. Admits 'a blunt and a couple of lines' tonight. Reports he has access to guns or 'anything I need'.

## 2022-04-20 NOTE — ED Notes (Signed)
Patient refused shower at this time

## 2022-04-20 NOTE — ED Notes (Signed)
Pt left with police officers

## 2022-04-20 NOTE — ED Triage Notes (Signed)
Pt arrives with PD, flagged them down asking for help, stated he wanted help because he was 'thinking about doing something violent' or harming himself. Pt is concerned about going back to jail. Hx acromegaly, DM, htn. States he has been taking thorazine with no effect. Admits 'a blunt and a couple of lines' tonight. Reports he has access to guns or 'anything I need'.

## 2022-04-20 NOTE — H&P (Signed)
Behavioral Health Medical Screening Exam  Duane Salazar is a 46 y.o. male.well known to this service. Pt brought in by [employer] to the [facility] Pt is yelling, aggressive, violent and banging on the glass doors, and heard saying "they are gonna take me to jail, damn, I'm not gonna make it, let me out, now".    Unable to speak to or redirect patient. [employer] reports patient was brought in from [facility].    I spoke with Dr Tyrone Nine at [facility], who reports patient was seen at the ED and sent over to the [facility] for psychiatric evaluation. Dr Tyrone Nine is informed of the patient's current presentation, and he agrees for the patient to be brought back to the ED.   Pt is transferred back to [facility] by [employer] due to needing higher level of care at this time as there are no appropriate beds available at the [facility]          On evaluation, patient is alert. Unable to assess orientation as patient is very agitated and aggressive. Speech is pressured.  Pt  is extremely anxious with  no eye contact. Mood is anxious, affect is congruent with mood. Unable to assess thought process and thought content. Unable to assess for SI/HI/AVH as judgement is impaired.   Total Time spent with patient: 20 minutes  Psychiatric Specialty Exam:  Presentation  General Appearance: Disheveled  Eye Contact:Absent  Speech:Other (comment) (loud and pressured)  Speech Volume:Increased  Handedness:Right   Mood and Affect  Mood:Anxious  Affect:Congruent   Thought Process  Thought Processes:Other (comment) (UTA)  Descriptions of Associations:Circumstantial  Orientation:Other (comment) (UTA)  Thought Content:Other (comment) (UTA)  History of Schizophrenia/Schizoaffective disorder:No  Duration of Psychotic Symptoms:N/A  Hallucinations:Hallucinations: Other (comment) (UTA)  Ideas of Reference:None (UTA)  Suicidal Thoughts:Suicidal Thoughts: -- (UTA)  Homicidal Thoughts:Homicidal Thoughts: -- (UTA)   Sensorium  Memory:Other (comment)  (UTA)  Judgment:Impaired  Insight:Other (comment) (UTA)   Executive Functions  Concentration:Other (comment) (UTA)  Attention Span:Other (comment) (UTA)  Recall:Other (comment) (UTA)  Fund of Knowledge:Other (comment) (UTA)  Language:Other (comment) (UTA)   Psychomotor Activity  Psychomotor Activity:Psychomotor Activity: Other (comment) (UTA)   Assets  Assets:Other (comment) (UTA)   Sleep  Sleep:Sleep: -- (UTA)    Physical Exam: Physical Exam Constitutional:      General: He is in acute distress.  Eyes:     General:        Right eye: No discharge.        Left eye: No discharge.  Pulmonary:     Effort: No respiratory distress.  Neurological:     Mental Status: He is alert.  Psychiatric:        Mood and Affect: Mood is anxious. Affect is angry and inappropriate.        Speech: Speech is rapid and pressured.        Behavior: Behavior is agitated, aggressive and hyperactive.        Cognition and Memory: Cognition is impaired.        Judgment: Judgment is impulsive and inappropriate.    Review of Systems  Eyes:  Negative for discharge.  Respiratory:  Positive for shortness of breath. Negative for cough.   Gastrointestinal:  Negative for diarrhea, nausea and vomiting.  Neurological:  Negative for seizures and weakness.  Psychiatric/Behavioral:  The patient is nervous/anxious.    There were no vitals taken for this visit. There is no height or weight on file to calculate BMI.  Musculoskeletal: Strength & Muscle Tone: increased Gait &  Station: normal Patient leans: N/A  [place] Scale:  [place] ED from 04/20/2022 in [place] DEPT Most recent reading at 04/20/2022  5:34 AM ED from 04/20/2022 in [facility] DEPT Most recent reading at 04/20/2022  4:54 AM ED from 04/02/2022 in [facility] Most recent reading at 04/02/2022  6:43 AM  C-SSRS RISK CATEGORY High Risk  High Risk No Risk       Recommendations:  Based on my evaluation the patient appears to have an emergency medical condition for which I recommend the patient be transferred to the emergency department for further evaluation.  Disp-Pt transported to [facility] by [employer]. There are no appropriate beds available at the [facility]   Randon Goldsmith, NP 04/20/2022, 6:26 AM

## 2022-04-20 NOTE — BH Assessment (Signed)
[facility] Assessment Progress Note   Per Charmaine Downs, NP, this pt requires psychiatric hospitalization at this time.  Pt presents under IVC initiated by [place], DO.  [facility] will not be able to accommodate this pt today.  At the direction of Hampton Abbot, MD this writer has sought placement for pt at facilities outside of the [facility] system.  The following facilities have been contacted to seek placement for this pt, with results as noted:  Beds available, information sent, decision pending: [name]  Not referred: [facility] (financial criteria) [facility] (financial criteria) [facility] (financial criteria) [facility] (financial criteria) [facility] (financial criteria)  At capacity: [facility]   Jalene Mullet, [place] Coordinator [phone]

## 2022-04-20 NOTE — ED Notes (Signed)
Pt's phone placed in belongings bag by security

## 2022-04-20 NOTE — BH Assessment (Signed)
Per Dr. Dwyane Dee in the 9am morning huddle/bed meeting, Provider to assess. No TTS CCA is needed at this time.

## 2022-04-20 NOTE — ED Provider Notes (Signed)
[facility] DEPT Provider Note   CSN: [id number] Arrival date & time: 04/20/22  0425     History  Chief Complaint  Patient presents with   Suicidal    Duane Salazar is a 46 y.o. male.  46 yo M with a chief complaints of suicidal and homicidal ideation.  This been going on for months.  He tells me that he cannot take it any longer, he has been on Thorazine but he has not been taking it as he supposed to but says its not working.  He decided to do a line of cocaine and smokes marijuana after he said he was clean for about 70 days.  He found some police officers in a parking lot and he waved them down and demanded they take him here to be evaluated.        Home Medications Prior to Admission medications   Medication Sig Start Date End Date Taking? Authorizing Provider  amLODipine (NORVASC) 10 MG tablet Take 1 tablet (10 mg total) by mouth daily. 03/26/22   Ladell Pier, MD  benztropine (COGENTIN) 0.5 MG tablet Take 1 tablet (0.5 mg total) by mouth 2 (two) times daily. 12/14/21   Nicholes Rough, NP  celecoxib (CELEBREX) 200 MG capsule Take 1 capsule (200 mg total) by mouth daily. 03/26/22   Ladell Pier, MD  chlorproMAZINE (THORAZINE) 25 MG tablet Take 3 tablets (75 mg total) by mouth 2 (two) times daily. 12/14/21   Nicholes Rough, NP  chlorproMAZINE (THORAZINE) 50 MG tablet Take 3 tablets (150 mg total) by mouth at bedtime. 12/14/21   Nicholes Rough, NP  gabapentin (NEURONTIN) 400 MG capsule Take 1 capsule (400 mg total) by mouth 3 (three) times daily. 02/22/22 05/06/22  Mayers, Cari S, PA-C  HYDROcodone-acetaminophen (NORCO/VICODIN) 5-325 MG tablet Take 1 tablet by mouth every 6 (six) hours as needed for severe pain. 04/02/22   Azucena Cecil, PA-C  hydrOXYzine (ATARAX) 25 MG tablet Take 1 tablet (25 mg total) by mouth every 6 (six) hours as needed for anxiety. 12/14/21   Nicholes Rough, NP  metFORMIN (GLUCOPHAGE) 500 MG tablet Take 1 tablet (500  mg total) by mouth 2 (two) times daily with a meal. 03/26/22   Ladell Pier, MD  predniSONE (DELTASONE) 5 MG tablet Take 1 tablet (5 mg total) by mouth daily with breakfast. 03/26/22   Ladell Pier, MD  traMADol (ULTRAM) 50 MG tablet Take 1 tablet (50 mg total) by mouth every 12 (twelve) hours as needed. 04/09/22   Aundra Dubin, PA-C  traZODone (DESYREL) 100 MG tablet Take 1 tablet (100 mg total) by mouth at bedtime as needed for sleep. 12/14/21   Nicholes Rough, NP      Allergies    Patient has no known allergies.    Review of Systems   Review of Systems  Physical Exam Updated Vital Signs BP (!) 171/128 (BP Location: Right Arm)   Pulse (!) 104   Temp 97.6 F (36.4 C) (Oral)   Resp 20   SpO2 97%  Physical Exam Vitals and nursing note reviewed.  Constitutional:      Appearance: He is well-developed.  HENT:     Head: Normocephalic and atraumatic.  Eyes:     Pupils: Pupils are equal, round, and reactive to light.  Neck:     Vascular: No JVD.  Cardiovascular:     Rate and Rhythm: Normal rate and regular rhythm.     Heart sounds: No murmur heard.  No friction rub. No gallop.  Pulmonary:     Effort: No respiratory distress.     Breath sounds: No wheezing.  Abdominal:     General: There is no distension.     Tenderness: There is no abdominal tenderness. There is no guarding or rebound.  Musculoskeletal:        General: Normal range of motion.     Cervical back: Normal range of motion and neck supple.  Skin:    Coloration: Skin is not pale.     Findings: No rash.  Neurological:     Mental Status: He is alert and oriented to person, place, and time.  Psychiatric:        Mood and Affect: Affect is blunt.        Behavior: Behavior normal.     ED Results / Procedures / Treatments   Labs (all labs ordered are listed, but only abnormal results are displayed) Labs Reviewed - No data to display  EKG None  Radiology No results  found.  Procedures Procedures    Medications Ordered in ED Medications - No data to display  ED Course/ Medical Decision Making/ A&P                           Medical Decision Making  46 yo M with a chief complaints of suicidal and homicidal ideation.  The patient tells me that he is on Thorazine and is not working.  He has not been taking it as he should but thinks it is not working anyway.  He decided to smoke marijuana into a line of cocaine.  He then waved down some please officers and told them that he was thinking about harming himself and others.  I asked him some simple questions at bedside which made him very angry with me.  I asked him why he did not go to the behavioral urgent care center as they would be able to see him right away and here he be held against his will for 3 to 4 hours until someone came in in the morning to evaluate him.  Eventually the patient demanded to leave and police are going to escort him to the behavioral health.  5:09 AM:  I have discussed the diagnosis/risks/treatment options with the patient.  Evaluation and diagnostic testing in the emergency department does not suggest an emergent condition requiring admission or immediate intervention beyond what has been performed at this time.  They will follow up with  [place]. We also discussed returning to the ED immediately if new or worsening sx occur. We discussed the sx which are most concerning (e.g., sudden worsening pain, fever, inability to tolerate by mouth) that necessitate immediate return. Medications administered to the patient during their visit and any new prescriptions provided to the patient are listed below.  Medications given during this visit Medications - No data to display   The patient appears reasonably screen and/or stabilized for discharge and I doubt any other medical condition or other [facility] requiring further screening, evaluation, or treatment in the ED at this time prior to discharge.           Final Clinical Impression(s) / ED Diagnoses Final diagnoses:  Suicidal ideation    Rx / DC Orders ED Discharge Orders     None         Deno Etienne, DO 04/20/22 3785

## 2022-04-21 DIAGNOSIS — F1490 Cocaine use, unspecified, uncomplicated: Secondary | ICD-10-CM

## 2022-04-21 DIAGNOSIS — F1994 Other psychoactive substance use, unspecified with psychoactive substance-induced mood disorder: Secondary | ICD-10-CM

## 2022-04-21 NOTE — Progress Notes (Signed)
This CSW attempted to contact the pt to verify the address before deciding on a cab voucher. Anderson Malta, RN, stated the pt verified his address in the chart. The pt will receive a cab voucher and be discharged home. TOC signing off.  Ulysees Barns, MSW, [email or web address]

## 2022-04-21 NOTE — Discharge Instructions (Addendum)
For your behavioral health needs you are advised to follow up with [facility] at your earliest opportunity:      [facility]      [address], [place], [place] [address]      [phone]      They offer psychiatry/medication management and therapy.  New patients are seen in their walk-in clinic.  Walk-in hours are Monday, Wednesday, Thursday and Friday from 8:00 am - 11:00 am for psychiatry, and Monday and Wednesday from 8:00 am - 11:00 am for therapy.  Walk-in patients are seen on a first come, first served basis, so try to arrive as early as possible for the best chance of being seen the same day.  BE SURE TO TAKE THE ELEVATOR TO THE SECOND FLOOR.  Please note that to be eligible for services you must bring an ID or a piece of mail with your name and a [facility] address.  _______________________________________________  Discharge recommendations:  Patient is to take medications as prescribed. Please see information for follow-up appointment with psychiatry and therapy. Please follow up with your primary care provider for all medical related needs.   Therapy: We recommend that patient participate in individual therapy to address mental health concerns.  Medications: The patient is to contact a medical professional and/or outpatient provider to address any new side effects that develop. Patient should update outpatient providers of any new medications and/or medication changes.   Atypical antipsychotics: If you are prescribed an atypical antipsychotic, it is recommended that your height, weight, BMI, blood pressure, fasting lipid panel, and fasting blood sugar be monitored by your outpatient providers.  Safety:  The patient should abstain from use of illicit substances/drugs and abuse of any medications. If symptoms worsen or do not continue to improve or if the patient becomes actively suicidal or homicidal then it is  recommended that the patient return to the closest hospital emergency department, the [facility], or call 911 for further evaluation and treatment. National Suicide Prevention Lifeline 1-800-SUICIDE or 332-116-1978.  About 988 988 offers 24/7 access to trained crisis counselors who can help people experiencing mental health-related distress. People can call or text 988 or chat 988lifeline.org for themselves or if they are worried about a loved one who may need crisis support.  Crisis Mobile: [facility]:                     (463)823-3650 (for crisis response 24 hours a day) [place]:                                            [phone]

## 2022-04-21 NOTE — ED Provider Notes (Addendum)
Emergency Medicine Observation Re-evaluation Note  Dresden Schoenfelder is a 46 y.o. male, seen on rounds today.  Pt initially presented to the ED for complaints of Suicidal   Physical Exam  BP (!) 146/100 (BP Location: Right Arm)   Pulse 83   Temp (!) 97.3 F (36.3 C) (Oral)   Resp 17   Ht 6' 3.5" (1.918 m)   SpO2 99%   BMI 32.19 kg/m  Physical Exam General: Appears to be resting comfortably in bed, no acute distress. Cardiac: Regular rate, normal heart rate, non-emergent blood pressure for this morning's vitals. Lungs: No increased work of breathing.  Equal chest rise appreciated Psych: Calm, asleep in bed.   ED Course / MDM  EKG:   I have reviewed the labs performed to date as well as medications administered while in observation.   Plan  Current plan is for IVC for outside placement. Aviraj Titsworth is under involuntary commitment.      Tretha Sciara, MD 04/21/22 [phone] Addendum: I received update from behavioral health team that on reassessment this morning, they feel the patient is stable for outpatient care and management.  They have provided resources and they have rescinded the IVC.  Based upon this, there is no indication to continue holding the patient.  Patient discharged for outpatient care and management.   Tretha Sciara, MD 04/21/22 1224

## 2022-04-21 NOTE — ED Notes (Signed)
Pt took a shower and is resting comfortably

## 2022-04-21 NOTE — Discharge Summary (Signed)
[facility] Psych ED Discharge  04/21/2022 11:43 AM Duane Salazar  MRN:  [id number]  Principal Problem: Substance induced mood disorder [facility]) Discharge Diagnoses: Principal Problem:   Substance induced mood disorder ([place]) Active Problems:   Schizoaffective disorder ([place])   Cocaine use  Clinical Impression:  Final diagnoses:  Suicidal ideation  Polysubstance abuse ([place])   Subjective: Duane Salazar is a 46 y.o. male patient admitted with suicide ideation and Homicidal ideation using a gun.  Patient reported he has a gun. Patient flagged down  Police officer stating he he was thinking of doing something "Violent or harming himself"  Patient became aggressive and agitated in the ER and was IVC by the ER Physician  On evaluation patient is alert and oriented x 4, pleasant, and cooperative. Speech is clear and coherent. Mood is depressed and affect is congruent with mood. Thought process is coherent and thought content is logical. Denies auditory and visual hallucinations. No indication that patient is responding to internal stimuli. No evidence of delusional thought content. Denies suicidal ideations. Denies homicidal ideations. Patient reports that he would like to resume outpatient treatment at [facility].  ED Assessment Time Calculation: Start Time: 1013 Stop Time: 1038 Total Time in Minutes (Assessment Completion): 25   Past Psychiatric History: Hx of Schizoaffective disorder, Schizophrenia, Polysubstance abuse.  Multiple inpatient Psychiatric Hospitalizations.  Last admission at [facility] in April.  Multiple ER Visits and [place] visits as well.  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   Schizo affective schizophrenia ([place]) 1995   Schizophrenia ([place])    Seizures  ([place])    Sleep apnea 1995   on CPAP    Past Surgical History:  Procedure Laterality Date   PITUITARY SURGERY  2005 & 2012   SKIN BIOPSY     Family History:  Family History  Problem Relation Age of Onset   Hypertension Mother    Diabetes Mother    Heart Problems Mother    Cancer Maternal Uncle    Alcoholism Maternal Uncle    Cancer Maternal Grandmother    Heart disease Neg Hx     Social History:  Social History   Substance and Sexual Activity  Alcohol Use Yes   Alcohol/week: 2.0 standard drinks of alcohol   Types: 2 Cans of beer per week   Comment: 2 cans on workdays; 6-pk on days off; varies     Social History   Substance and Sexual Activity  Drug Use Yes   Types: Cocaine, Marijuana, Heroin   Comment: Reports use 2 grams Cocaine    Social History   Socioeconomic History   Marital status: Single    Spouse name: Not on file   Number of children: 2   Years of education: GED   Highest education level: Not on file  Occupational History   Not on file  Tobacco Use   Smoking status: Every Day    Packs/day: 1.00    Years: 20.00    Total pack years: 20.00    Types: Cigarettes   Smokeless tobacco: Never   Tobacco comments:    Smoking .5 ppd  Substance and Sexual Activity   Alcohol use: Yes    Alcohol/week: 2.0 standard drinks of alcohol  Types: 2 Cans of beer per week    Comment: 2 cans on workdays; 6-pk on days off; varies   Drug use: Yes    Types: Cocaine, Marijuana, Heroin    Comment: Reports use 2 grams Cocaine   Sexual activity: Yes  Other Topics Concern   Not on file  Social History Narrative   Lives with mom.   Incarcerated for 22 months in [place], [place]. From 2014-09/2014   Social Determinants of Health   Financial Resource Strain: Not on file  Food Insecurity: Not on file  Transportation Needs: Not on file  Physical Activity: Not on file  Stress: Not on file  Social Connections: Not on file    Tobacco Cessation:  A prescription for an  FDA-approved tobacco cessation medication was offered at discharge and the patient refused  Current Medications: Current Facility-Administered Medications  Medication Dose Route Frequency Provider Last Rate Last Admin   amLODipine (NORVASC) tablet 10 mg  10 mg Oral Daily Pattricia Boss, MD   10 mg at 04/21/22 0958   benztropine (COGENTIN) tablet 0.5 mg  0.5 mg Oral BID Charmaine Downs C, NP   0.5 mg at 04/21/22 5465   chlorproMAZINE (THORAZINE) tablet 150 mg  150 mg Oral QHS Pattricia Boss, MD   150 mg at 04/20/22 2136   chlorproMAZINE (THORAZINE) tablet 50 mg  50 mg Oral BID Charmaine Downs C, NP   50 mg at 04/21/22 0354   diphenhydrAMINE (BENADRYL) injection 25 mg  25 mg Intramuscular Once Charmaine Downs C, NP       hydrOXYzine (ATARAX) tablet 25 mg  25 mg Oral TID PRN Charmaine Downs C, NP   25 mg at 04/20/22 1208   metFORMIN (GLUCOPHAGE) tablet 500 mg  500 mg Oral BID WC Pattricia Boss, MD   500 mg at 04/21/22 0958   traZODone (DESYREL) tablet 100 mg  100 mg Oral QHS Charmaine Downs C, NP   100 mg at 04/20/22 2137   Current Outpatient Medications  Medication Sig Dispense Refill   benztropine (COGENTIN) 0.5 MG tablet Take 1 tablet (0.5 mg total) by mouth 2 (two) times daily. (Patient taking differently: Take 0.5 mg by mouth every morning.) 60 tablet 0   celecoxib (CELEBREX) 200 MG capsule Take 1 capsule (200 mg total) by mouth daily. 30 capsule 3   gabapentin (NEURONTIN) 400 MG capsule Take 1 capsule (400 mg total) by mouth 3 (three) times daily. 90 capsule 1   metFORMIN (GLUCOPHAGE) 500 MG tablet Take 1 tablet (500 mg total) by mouth 2 (two) times daily with a meal. 60 tablet 5   predniSONE (DELTASONE) 5 MG tablet Take 1 tablet (5 mg total) by mouth daily with breakfast. 30 tablet 1   traMADol (ULTRAM) 50 MG tablet Take 1 tablet (50 mg total) by mouth every 12 (twelve) hours as needed. (Patient taking differently: Take 50 mg by mouth every morning.) 30 tablet 0   traZODone (DESYREL)  100 MG tablet Take 1 tablet (100 mg total) by mouth at bedtime as needed for sleep. 30 tablet 0   amLODipine (NORVASC) 10 MG tablet Take 1 tablet (10 mg total) by mouth daily. (Patient not taking: Reported on 04/20/2022) 30 tablet 6   chlorproMAZINE (THORAZINE) 50 MG tablet Take 3 tablets (150 mg total) by mouth at bedtime. (Patient not taking: Reported on 04/20/2022) 90 tablet 0   HYDROcodone-acetaminophen (NORCO/VICODIN) 5-325 MG tablet Take 1 tablet by mouth every 6 (six) hours as needed for severe pain. (Patient not taking:  Reported on 04/20/2022) 8 tablet 0   hydrOXYzine (ATARAX) 25 MG tablet Take 1 tablet (25 mg total) by mouth every 6 (six) hours as needed for anxiety. (Patient not taking: Reported on 04/20/2022) 60 tablet 0   PTA Medications: (Not in a hospital admission)   [place] Scale:  [place] ED from 04/20/2022 in [place] DEPT Most recent reading at 04/20/2022  5:34 AM ED from 04/20/2022 in [place] DEPT Most recent reading at 04/20/2022  4:54 AM ED from 04/02/2022 in [place] Most recent reading at 04/02/2022  6:43 AM  C-SSRS RISK CATEGORY High Risk High Risk No Risk       Musculoskeletal: Strength & Muscle Tone: within normal limits Gait & Station: normal Patient leans: N/A  Psychiatric Specialty Exam: Presentation  General Appearance: Casual  Eye Contact:Good  Speech:Clear and Coherent; Normal Rate  Speech Volume:Normal  Handedness:Right   Mood and Affect  Mood:Dysphoric  Affect:Congruent   Thought Process  Thought Processes:Coherent; Goal Directed; Linear  Descriptions of Associations:Intact  Orientation:Full (Time, Place and Person)  Thought Content:Logical  History of Schizophrenia/Schizoaffective disorder:No  Duration of Psychotic Symptoms:N/A  Hallucinations:Hallucinations: None  Ideas of Reference:None  Suicidal Thoughts:Suicidal  Thoughts: No SI Passive Intent and/or Plan: -- (states he is not going to act on his suicidal ideation.)  Homicidal Thoughts:Homicidal Thoughts: No HI Passive Intent and/or Plan: -- (not going to act on it.)   Sensorium  Memory:Immediate Good; Recent Good; Remote Good  Judgment:Intact  Insight:Fair   Executive Functions  Concentration:Good  Attention Span:Good  [place]  Language:Good   Psychomotor Activity  Psychomotor Activity:Psychomotor Activity: Normal   Assets  Assets:Communication Skills; Desire for Improvement; Housing   Sleep  Sleep:Sleep: Fair    Physical Exam: Physical Exam Constitutional:      General: He is not in acute distress.    Appearance: He is not ill-appearing, toxic-appearing or diaphoretic.  HENT:     Right Ear: External ear normal.     Left Ear: External ear normal.  Cardiovascular:     Rate and Rhythm: Normal rate.  Pulmonary:     Effort: Pulmonary effort is normal. No respiratory distress.  Musculoskeletal:        General: Normal range of motion.  Neurological:     Mental Status: He is alert and oriented to person, place, and time.  Psychiatric:        Thought Content: Thought content is not paranoid or delusional. Thought content does not include homicidal or suicidal ideation.    Review of Systems  Constitutional:  Negative for chills, diaphoresis, fever, malaise/fatigue and weight loss.  Cardiovascular:  Negative for chest pain and palpitations.  Gastrointestinal:  Negative for diarrhea, nausea and vomiting.  Neurological:  Negative for dizziness and seizures.  Psychiatric/Behavioral:  Positive for depression and substance abuse. Negative for hallucinations, memory loss and suicidal ideas. The patient is nervous/anxious and has insomnia.    Blood pressure (!) 146/100, pulse 83, temperature (!) 97.3 F (36.3 C), temperature source Oral, resp. rate 17, height 6' 3.5" (1.918 m), SpO2 99 %. Body  mass index is 32.19 kg/m.   Demographic Factors:  Male, Low socioeconomic status, and Unemployed  Loss Factors: Financial problems/change in socioeconomic status  Historical Factors: Prior suicide attempts and Family history of mental illness or substance abuse  Risk Reduction Factors:   Religious beliefs about death and Living with another person, especially a relative  Continued Clinical Symptoms:  Alcohol/Substance Abuse/Dependencies Previous Psychiatric Diagnoses and Treatments Medical Diagnoses and Treatments/Surgeries  Cognitive Features That Contribute To Risk:  None    Suicide Risk:  Mild:  Suicidal ideation of limited frequency, intensity, duration, and specificity.  There are no identifiable plans, no associated intent, mild dysphoria and related symptoms, good self-control (both objective and subjective assessment), few other risk factors, and identifiable protective factors, including available and accessible social support.   Medical Decision Making: At time of discharge, patient denies SI, HI, AVH and is able to contract for safety. He demonstrated no overt evidence of psychosis or mania. Prior to discharge the patient verbalized that they understood warning signs, triggers, and symptoms of worsening mental health and how to access emergency mental health care if they felt it was needed. Patient was instructed to call 911 or return to the emergency room if they experienced any concerning symptoms after discharge. Patient voiced understanding and agreed to this.  Problem 1: Schizoaffective disorder, Depressed type   Problem 2: Cannabis use disorder, moderate use   Problem 3: Cocaine use disorder, unspecified use.   Disposition: No evidence of imminent risk to self or others at present.   Patient does not meet criteria for psychiatric inpatient admission. Discussed crisis plan, support from social network, calling 911, coming to the Emergency Department, and  calling Suicide Hotline.     Discharge Instructions      For your behavioral health needs you are advised to follow up with [facility] at your earliest opportunity:      [facility]      [address] [place], [place] [address]      [phone]      They offer psychiatry/medication management and therapy.  New patients are seen in their walk-in clinic.  Walk-in hours are Monday, Wednesday, Thursday and Friday from 8:00 am - 11:00 am for psychiatry, and Monday and Wednesday from 8:00 am - 11:00 am for therapy.  Walk-in patients are seen on a first come, first served basis, so try to arrive as early as possible for the best chance of being seen the same day.  BE SURE TO TAKE THE ELEVATOR TO THE SECOND FLOOR.  Please note that to be eligible for services you must bring an ID or a piece of mail with your name and a [facility] address.  _______________________________________________  Discharge recommendations:  Patient is to take medications as prescribed. Please see information for follow-up appointment with psychiatry and therapy. Please follow up with your primary care provider for all medical related needs.   Therapy: We recommend that patient participate in individual therapy to address mental health concerns.  Medications: The patient is to contact a medical professional and/or outpatient provider to address any new side effects that develop. Patient should update outpatient providers of any new medications and/or medication changes.   Atypical antipsychotics: If you are prescribed an atypical antipsychotic, it is recommended that your height, weight, BMI, blood pressure, fasting lipid panel, and fasting blood sugar be monitored by your outpatient providers.  Safety:  The patient should abstain from use of illicit substances/drugs and abuse of any medications. If symptoms worsen or do not continue to improve or if the  patient becomes actively suicidal or homicidal then it is recommended that the patient return to the closest hospital emergency department, the [facility], or call 911 for further evaluation and treatment. National Suicide Prevention Lifeline 1-800-SUICIDE  or 719-485-8001.  About 988 988 offers 24/7 access to trained crisis counselors who can help people experiencing mental health-related distress. People can call or text 988 or chat 988lifeline.org for themselves or if they are worried about a loved one who may need crisis support.  Crisis Mobile: [facility]:                     (919)820-8963 (for crisis response 24 hours a day) [place]:                                            [phone]        Rozetta Nunnery, NP 04/21/2022, 11:43 AM

## 2022-04-21 NOTE — BH Assessment (Signed)
[facility] Assessment Progress Note   Per Lindon Romp, NP, this pt does not require psychiatric hospitalization at this time.  Pt presents under IVC initiated by EDP Deno Etienne, DO which has been rescinded by [facility].  Pt is psychiatrically cleared.  Discharge instructions include referral information for [facility].  EDP Tretha Sciara, MD and pt's nurse, Anderson Malta, have been notified.  Jalene Mullet, [place] Triage Specialist [phone]

## 2022-04-28 ENCOUNTER — Other Ambulatory Visit: Payer: Self-pay | Admitting: Internal Medicine

## 2022-04-28 ENCOUNTER — Other Ambulatory Visit: Payer: Self-pay

## 2022-04-28 DIAGNOSIS — G5691 Unspecified mononeuropathy of right upper limb: Secondary | ICD-10-CM

## 2022-04-28 MED ORDER — GABAPENTIN 400 MG PO CAPS
400.0000 mg | ORAL_CAPSULE | Freq: Three times a day (TID) | ORAL | 1 refills | Status: AC
  Filled 2022-04-28 – 2022-05-07 (×2): qty 90, 30d supply, fill #0

## 2022-04-29 ENCOUNTER — Other Ambulatory Visit: Payer: Self-pay

## 2022-04-30 ENCOUNTER — Ambulatory Visit (INDEPENDENT_AMBULATORY_CARE_PROVIDER_SITE_OTHER): Payer: Self-pay | Admitting: Orthopaedic Surgery

## 2022-04-30 ENCOUNTER — Other Ambulatory Visit: Payer: Self-pay

## 2022-04-30 ENCOUNTER — Encounter: Payer: Self-pay | Admitting: Orthopaedic Surgery

## 2022-04-30 ENCOUNTER — Other Ambulatory Visit: Payer: Self-pay | Admitting: Physician Assistant

## 2022-04-30 DIAGNOSIS — G5621 Lesion of ulnar nerve, right upper limb: Secondary | ICD-10-CM | POA: Insufficient documentation

## 2022-04-30 DIAGNOSIS — G5601 Carpal tunnel syndrome, right upper limb: Secondary | ICD-10-CM | POA: Insufficient documentation

## 2022-04-30 DIAGNOSIS — M1611 Unilateral primary osteoarthritis, right hip: Secondary | ICD-10-CM

## 2022-04-30 MED ORDER — ACETAMINOPHEN-CODEINE 300-30 MG PO TABS
1.0000 | ORAL_TABLET | Freq: Every day | ORAL | 0 refills | Status: AC | PRN
  Filled 2022-04-30 (×2): qty 30, 15d supply, fill #0

## 2022-04-30 NOTE — Progress Notes (Signed)
Office Visit Note   Patient: Duane Salazar           Date of Birth: 05/07/1976           MRN: [id number] Visit Date: 04/30/2022              Requested by: Ladell Pier, MD [address],  [place] [address] PCP: Ladell Pier, MD   Assessment & Plan: Visit Diagnoses:  1. Right carpal tunnel syndrome   2. Cubital tunnel syndrome on right   3. Primary osteoarthritis of right hip     Plan: EMGs confirm severe right ulnar neuropathy and moderate carpal tunnel syndrome.  Based on these findings I have recommended cubital tunnel and carpal tunnel release.  Jackelyn Poling will call the patient to schedule surgery.  In regards to his right hip DJD he is bone-on-bone and has elected to move forward with a right total hip replacement.  Patient will need to establish good oral care and dentition prior to surgery.  He will see a dentist in the meantime.  Patient does have a history of drug use.  Lives with his mother.  We will need to address the hip once he has recovered from the cubital tunnel and carpal tunnel surgeries.  Follow-Up Instructions: No follow-ups on file.   Orders:  No orders of the defined types were placed in this encounter.  Meds ordered this encounter  Medications   acetaminophen-codeine (TYLENOL #3) 300-30 MG tablet    Sig: Take 1-2 tablets by mouth daily as needed for up to 7 days for moderate pain.    Dispense:  30 tablet    Refill:  0      Procedures: No procedures performed   Clinical Data: No additional findings.   Subjective: Chief Complaint  Patient presents with   Right Hand - Pain, Follow-up    HPI Mr. Motton returns today for follow-up of right upper extremity EMGs and right hip DJD.  Review of Systems   Objective: Vital Signs: There were no vitals taken for this visit.  Physical Exam  Ortho Exam Examination of the right upper extremity shows severe atrophy of the right hand. Examination of right hip shows pain with  flexion past 70 degrees.  Very limited range of motion and circumduction secondary to pain. Specialty Comments:  No specialty comments available.  Imaging: No results found.   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   Substance induced mood disorder ([place]) 11/12/2021   Malingering 11/12/2021   Right hand weakness 11/07/2021   Neuropathy of hand, right 11/07/2021   Suicidal ideations 10/29/2021   Substance use disorder 10/29/2021   Ulnar neuropathy of right upper extremity 09/04/2021   H/O acromegaly 09/04/2021   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   Essential hypertension  10/21/2014   Morbid obesity with BMI of 40.0-44.9, adult ([place]) 10/21/2014   Sleep apnea    Arthritis    Diabetes type 2, uncontrolled    Adrenal insufficiency ([place]) 12/23/2005   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   Past Medical History:  Diagnosis Date   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   AKI (acute kidney injury) ([place]) 03/21/2015   Arthritis Dx 2002   Diabetes type 2, controlled ([place]) 2010   Drug abuse ([place])    Headache    Hypertension Dx 2002   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   Schizo affective schizophrenia ([place]) 1995   Schizophrenia ([place])    Seizures ([place])    Sleep apnea 1995   on CPAP    Family History  Problem Relation Age of Onset   Hypertension Mother    Diabetes Mother    Heart Problems Mother    Cancer Maternal Uncle    Alcoholism Maternal Uncle    Cancer Maternal Grandmother    Heart disease Neg Hx     Past Surgical History:  Procedure Laterality Date   PITUITARY SURGERY  2005 & 2012   SKIN BIOPSY     Social History   Occupational History   Not on file  Tobacco Use   Smoking status: Every Day    Packs/day: 1.00    Years: 20.00    Total pack years: 20.00    Types: Cigarettes   Smokeless tobacco: Never   Tobacco comments:    Smoking .5 ppd  Substance and Sexual Activity   Alcohol use: Yes    Alcohol/week: 2.0 standard drinks of alcohol    Types: 2 Cans of beer per week    Comment: 2 cans on workdays; 6-pk on days off; varies   Drug use: Yes    Types: Cocaine, Marijuana, Heroin    Comment: Reports use 2 grams Cocaine   Sexual activity: Yes

## 2022-05-04 ENCOUNTER — Other Ambulatory Visit: Payer: Self-pay

## 2022-05-06 ENCOUNTER — Other Ambulatory Visit: Payer: Self-pay

## 2022-05-07 ENCOUNTER — Other Ambulatory Visit: Payer: Self-pay

## 2022-05-07 ENCOUNTER — Other Ambulatory Visit: Payer: Self-pay | Admitting: Internal Medicine

## 2022-05-07 MED ORDER — TRAZODONE HCL 100 MG PO TABS
100.0000 mg | ORAL_TABLET | Freq: Every evening | ORAL | 0 refills | Status: AC | PRN
  Filled 2022-05-07: qty 30, 30d supply, fill #0

## 2022-05-10 ENCOUNTER — Other Ambulatory Visit: Payer: Self-pay

## 2022-05-11 ENCOUNTER — Other Ambulatory Visit: Payer: Self-pay

## 2022-05-20 ENCOUNTER — Emergency Department (HOSPITAL_COMMUNITY): Payer: Self-pay

## 2022-05-20 ENCOUNTER — Other Ambulatory Visit: Payer: Self-pay

## 2022-05-20 ENCOUNTER — Encounter (HOSPITAL_COMMUNITY): Payer: Self-pay | Admitting: Emergency Medicine

## 2022-05-20 ENCOUNTER — Emergency Department (HOSPITAL_COMMUNITY)
Admission: EM | Admit: 2022-05-20 | Discharge: 2022-05-20 | Disposition: A | Discharge status: Home / Self Care | Payer: Self-pay | Attending: Emergency Medicine | Admitting: Emergency Medicine

## 2022-05-20 DIAGNOSIS — M79641 Pain in right hand: Secondary | ICD-10-CM | POA: Insufficient documentation

## 2022-05-20 DIAGNOSIS — W19XXXA Unspecified fall, initial encounter: Secondary | ICD-10-CM | POA: Insufficient documentation

## 2022-05-20 DIAGNOSIS — Y906 Blood alcohol level of 120-199 mg/100 ml: Secondary | ICD-10-CM | POA: Insufficient documentation

## 2022-05-20 DIAGNOSIS — Z79899 Other long term (current) drug therapy: Secondary | ICD-10-CM | POA: Insufficient documentation

## 2022-05-20 DIAGNOSIS — M1611 Unilateral primary osteoarthritis, right hip: Secondary | ICD-10-CM | POA: Insufficient documentation

## 2022-05-20 DIAGNOSIS — I10 Essential (primary) hypertension: Secondary | ICD-10-CM | POA: Insufficient documentation

## 2022-05-20 DIAGNOSIS — E119 Type 2 diabetes mellitus without complications: Secondary | ICD-10-CM | POA: Insufficient documentation

## 2022-05-20 DIAGNOSIS — F332 Major depressive disorder, recurrent severe without psychotic features: Secondary | ICD-10-CM | POA: Insufficient documentation

## 2022-05-20 DIAGNOSIS — Z7984 Long term (current) use of oral hypoglycemic drugs: Secondary | ICD-10-CM | POA: Insufficient documentation

## 2022-05-20 DIAGNOSIS — R45851 Suicidal ideations: Secondary | ICD-10-CM | POA: Insufficient documentation

## 2022-05-20 HISTORY — DX: Unspecified viral hepatitis C without hepatic coma: B19.20

## 2022-05-20 LAB — RAPID URINE DRUG SCREEN, HOSP PERFORMED
Amphetamines: NOT DETECTED
Barbiturates: NOT DETECTED
Benzodiazepines: NOT DETECTED
Cocaine: POSITIVE — AB
Opiates: NOT DETECTED
Tetrahydrocannabinol: POSITIVE — AB

## 2022-05-20 LAB — TROPONIN I (HIGH SENSITIVITY)
Troponin I (High Sensitivity): 6 ng/L (ref <18)
Troponin I (High Sensitivity): 7 ng/L (ref <18)

## 2022-05-20 LAB — COMPREHENSIVE METABOLIC PANEL
ALT: 35 U/L (ref 0–44)
AST: 41 U/L (ref 15–41)
Albumin: 4.4 g/dL (ref 3.5–5.0)
Alkaline Phosphatase: 59 U/L (ref 38–126)
Anion gap: 14 (ref 5–15)
BUN: 9 mg/dL (ref 6–20)
CO2: 20 mmol/L — ABNORMAL LOW (ref 22–32)
Calcium: 9.4 mg/dL (ref 8.9–10.3)
Chloride: 102 mmol/L (ref 98–111)
Creatinine, Ser: 0.77 mg/dL (ref 0.61–1.24)
GFR, Estimated: >60 mL/min (ref >60)
Glucose, Bld: 126 mg/dL — ABNORMAL HIGH (ref 70–99)
Potassium: 3.7 mmol/L (ref 3.5–5.1)
Sodium: 136 mmol/L (ref 135–145)
Total Bilirubin: 0.5 mg/dL (ref 0.3–1.2)
Total Protein: 8.1 g/dL (ref 6.5–8.1)

## 2022-05-20 LAB — CBC WITH DIFFERENTIAL/PLATELET
Abs Immature Granulocytes: 0.02 10*3/uL (ref 0.00–0.07)
Basophils Absolute: 0.1 10*3/uL (ref 0.0–0.1)
Basophils Relative: 1 %
Eosinophils Absolute: 0.1 10*3/uL (ref 0.0–0.5)
Eosinophils Relative: 1 %
HCT: 47 % (ref 39.0–52.0)
Hemoglobin: 15.3 g/dL (ref 13.0–17.0)
Immature Granulocytes: 0 %
Lymphocytes Relative: 34 %
Lymphs Abs: 3.2 10*3/uL (ref 0.7–4.0)
MCH: 24.2 pg — ABNORMAL LOW (ref 26.0–34.0)
MCHC: 32.6 g/dL (ref 30.0–36.0)
MCV: 74.4 fL — ABNORMAL LOW (ref 80.0–100.0)
Monocytes Absolute: 0.6 10*3/uL (ref 0.1–1.0)
Monocytes Relative: 6 %
Neutro Abs: 5.6 10*3/uL (ref 1.7–7.7)
Neutrophils Relative %: 58 %
Platelets: 271 10*3/uL (ref 150–400)
RBC: 6.32 MIL/uL — ABNORMAL HIGH (ref 4.22–5.81)
RDW: 17.2 % — ABNORMAL HIGH (ref 11.5–15.5)
WBC: 9.4 10*3/uL (ref 4.0–10.5)
nRBC: 0 % (ref 0.0–0.2)

## 2022-05-20 LAB — SALICYLATE LEVEL: Salicylate Lvl: <7 mg/dL — ABNORMAL LOW (ref 7.0–30.0)

## 2022-05-20 LAB — ACETAMINOPHEN LEVEL: Acetaminophen (Tylenol), Serum: <10 ug/mL — ABNORMAL LOW (ref 10–30)

## 2022-05-20 LAB — LIPASE, BLOOD: Lipase: 28 U/L (ref 11–51)

## 2022-05-20 LAB — ETHANOL: Alcohol, Ethyl (B): 133 mg/dL — ABNORMAL HIGH (ref <10)

## 2022-05-20 MED ORDER — TRAZODONE HCL 100 MG PO TABS: 100.0000 mg | ORAL_TABLET | Freq: Every evening | ORAL | Status: DC | PRN

## 2022-05-20 MED ORDER — FAMOTIDINE 20 MG PO TABS
20.0000 mg | ORAL_TABLET | Freq: Two times a day (BID) | ORAL | Status: DC
  Administered 2022-05-20 (×2): 20 mg via ORAL
  Filled 2022-05-20 (×2): qty 1

## 2022-05-20 MED ORDER — ALUM & MAG HYDROXIDE-SIMETH 200-200-20 MG/5ML PO SUSP
30.0000 mL | Freq: Once | ORAL | Status: AC
  Administered 2022-05-20: 30 mL via ORAL
  Filled 2022-05-20: qty 30

## 2022-05-20 MED ORDER — GABAPENTIN 400 MG PO CAPS
400.0000 mg | ORAL_CAPSULE | Freq: Three times a day (TID) | ORAL | Status: DC
  Administered 2022-05-20 (×3): 400 mg via ORAL
  Filled 2022-05-20 (×3): qty 1

## 2022-05-20 MED ORDER — PREDNISONE 5 MG PO TABS
5.0000 mg | ORAL_TABLET | Freq: Every day | ORAL | Status: DC
  Filled 2022-05-20: qty 1

## 2022-05-20 MED ORDER — LIDOCAINE VISCOUS HCL 2 % MT SOLN
15.0000 mL | Freq: Once | OROMUCOSAL | Status: AC
  Administered 2022-05-20: 15 mL via ORAL
  Filled 2022-05-20: qty 15

## 2022-05-20 MED ORDER — CHLORPROMAZINE HCL 25 MG PO TABS
75.0000 mg | ORAL_TABLET | Freq: Two times a day (BID) | ORAL | Status: DC
  Administered 2022-05-20 (×2): 75 mg via ORAL
  Filled 2022-05-20 (×4): qty 3

## 2022-05-20 MED ORDER — ACETAMINOPHEN 325 MG PO TABS
650.0000 mg | ORAL_TABLET | ORAL | Status: DC | PRN
  Administered 2022-05-20: 650 mg via ORAL
  Filled 2022-05-20: qty 2

## 2022-05-20 MED ORDER — METFORMIN HCL 500 MG PO TABS
500.0000 mg | ORAL_TABLET | Freq: Two times a day (BID) | ORAL | Status: DC
  Administered 2022-05-20: 500 mg via ORAL
  Filled 2022-05-20: qty 1

## 2022-05-20 MED ORDER — BENZTROPINE MESYLATE 1 MG PO TABS
0.5000 mg | ORAL_TABLET | Freq: Two times a day (BID) | ORAL | Status: DC
  Administered 2022-05-20 (×2): 0.5 mg via ORAL
  Filled 2022-05-20 (×4): qty 1

## 2022-05-20 MED ORDER — CELECOXIB 200 MG PO CAPS
200.0000 mg | ORAL_CAPSULE | Freq: Every day | ORAL | Status: DC
  Administered 2022-05-20: 200 mg via ORAL
  Filled 2022-05-20: qty 1

## 2022-05-20 MED ORDER — FAMOTIDINE 20 MG PO TABS
20.0000 mg | ORAL_TABLET | Freq: Once | ORAL | Status: AC
  Administered 2022-05-20: 20 mg via ORAL
  Filled 2022-05-20: qty 1

## 2022-05-20 MED ORDER — AMLODIPINE BESYLATE 5 MG PO TABS
10.0000 mg | ORAL_TABLET | Freq: Every day | ORAL | Status: DC
  Administered 2022-05-20: 10 mg via ORAL
  Filled 2022-05-20: qty 2

## 2022-05-20 MED ORDER — HYDROCODONE-ACETAMINOPHEN 5-325 MG PO TABS
1.0000 | ORAL_TABLET | Freq: Once | ORAL | Status: AC
  Administered 2022-05-20: 1 via ORAL
  Filled 2022-05-20: qty 1

## 2022-05-20 NOTE — ED Notes (Signed)
Patient's mother called this RN and voiced concerns about patient's safety and not wanting him discharged.  This RN listened to mother and reached out to MD and LCSW to have them reach out to her about her concerns.

## 2022-05-20 NOTE — ED Notes (Signed)
EDPA into see pt, pt alert, NAD, calm, interactive. Denies SI.

## 2022-05-20 NOTE — Progress Notes (Signed)
TOC CSW has attempted to contact pts mom, Kam Rahimi [phone].  I received a message from [place] telling me that the call can not be completed as dialed.    Sadie Pickar Tarpley-Carter, MSW, LCSW-A Pronouns:  She/Her/Hers [facility] Clinical Social Worker Direct Number:  [phone] [email or web address]

## 2022-05-20 NOTE — ED Triage Notes (Addendum)
Patient [place] ( homeless) reports GERD flare up with persistent hiccups this morning , he also fell this morning and reported right hip and left hand pain . Patient added that he is suicidal , plans to shoot himself.

## 2022-05-20 NOTE — ED Provider Triage Note (Signed)
Emergency Medicine Provider Triage Evaluation Note  Duane Salazar , a 46 y.o. male  was evaluated in triage.  Pt complains of fall and chest pain.   Patient reports chronic right hip pain leading to multiple falls, has had a few tonight, landed on his right hip worsening his pain. Denies other areas of acute injury. Denies head injury or LOC.   Also complaining of chest pain, burning to the chest/throat, no alleviating/aggravating factors. Denies dyspnea, diaphoresis, or vomiting.    Review of Systems  Per above.   Physical Exam  BP (!) 122/97 (BP Location: Right Arm)   Pulse 88   Temp 98 F (36.7 C)   Resp 18   SpO2 100%  Gen:   Awake, no distress   Resp:  Normal effort  MSK:   No midline spinal tenderness. Right hip diffusely tender to palpation. 2+ PT pulse int he RLE.  Other:  Patient refusing to cooperate during portions of exam limiting assessment.   Medical Decision Making  Medically screening exam initiated at 4:46 AM.  Appropriate orders placed.  Duane Salazar was informed that the remainder of the evaluation will be completed by another provider, this initial triage assessment does not replace that evaluation, and the importance of remaining in the ED until their evaluation is complete.  Fall.  Hip pain.  Chest pain.    Amaryllis Dyke, [place] 05/20/22 [phone]

## 2022-05-20 NOTE — Progress Notes (Addendum)
TOC CSW spoke with pts mom, Berneda Rose. [employer] wants [place] to do a 50/52 to commit pt.  CSW brought this to the doctor and RN.  CSW also attempted to contact [employer], who is currently unavailable.  CSW spoke to [employer] about finding her son incompetent, because the hospital does not have that authority to make choices for pt against his will.  CSW explained that Psychiatry did not find pt a threat to himself and/or to others.  [employer] wants pt reassessed and not discharged.  CSW assured [employer] she would share this information with doctor and RN.  Indigo Chaddock Tarpley-Carter, MSW, LCSW-A Pronouns:  She/Her/Hers [facility] Clinical Social Worker Direct Number:  [phone] [email or web address]

## 2022-05-20 NOTE — BH Assessment (Signed)
Comprehensive Clinical Assessment (CCA) Note  05/20/2022 Duane Salazar [id number]  Disposition:  Per Darrol Angel, NP, patient can be discharged to follow-up with OP Providers  The patient demonstrates the following risk factors for suicide: Chronic risk factors for suicide include: psychiatric disorder of depression, substance use disorder, previous suicide attempts x2, and medical illness chronic pain . Acute risk factors for suicide include: unemployment and social withdrawal/isolation. Protective factors for this patient include: hope for the future. Considering these factors, the overall suicide risk at this point appears to be moderate. Patient is appropriate for outpatient follow up.   Chief Complaint:  Chief Complaint  Patient presents with   GERD / Fall     Suicidal    Visit Diagnosis: F33.2 MDD Recurrent Severe, F14.20 Cocaine Use Disorder Severe    CCA Screening, Triage and Referral (STR)  Patient Reported Information How did you hear about [place]? Legal System  What Is the Reason for Your Visit/Call Today? Patient was brought to the [facility] by th police.  Patient is a known substance abuser with a history of homelessness and he admits to cocaine and marijuana use 1 week ago.  Patient states that he needs hip replacement and he is unable to walk and he has fallen several times. Patient states that last night he was frustrated with life in general and made statements about wanting to kill himself by blowing his brains out.  Patient states that he never had any access to a gun. Patient does, however state that he has two prior suicide attempts by trying to jump off a bridge.  Patient states that he is feeling better today and states that he is having positive thoughts and states that he wants to put his life back together and get things in order. Patient denies HI/Psychosis.  Patient states that he is currently residing with his mother and states that he can return there.  He is pending  disability.Patient states that his appetite and sleep are good.  He denies any history of abuse or self-mutilation. patient has an upcoming court date for the charge of Common Law Robbery in 5 days (05/25/2022).  Patient is requesting to be discharged home and he is contracting for safety.  How Long Has This Been Causing You Problems? > than 6 months  What Do You Feel Would Help You the Most Today? Alcohol or Drug Use Treatment; Treatment for Depression or other mood problem   Have You Recently Had Any Thoughts About Hurting Yourself? Yes  Are You Planning to Commit Suicide/Harm Yourself At This time? No   Have you Recently Had Thoughts About [place]? No  Are You Planning to Harm Someone at This Time? No  Explanation: Pt reports, Pt reports, if he had the opportunity he would kill his uncle and mothers boyfriend.   Have You Used Any Alcohol or Drugs in the Past 24 Hours? Yes  How Long Ago Did You Use Drugs or Alcohol? No data recorded What Did You Use and How Much? cocaine and marijuana, unknown amount one week ago.   Do You Currently Have a Therapist/Psychiatrist? No  Name of Therapist/Psychiatrist: Patient is linked with Roanna Epley at [facility]. However, has a hx of non compliance.   Have You Been Recently Discharged From Any Office Practice or Programs? No  Explanation of Discharge From Practice/Program: [facility] [place], discharged last month after just leaving the premises  and never returning.     CCA Screening Triage Referral Assessment Type of  Contact: Tele-Assessment  Telemedicine Service Delivery:   Is this Initial or Reassessment? Initial Assessment  Date Telepsych consult ordered in [facility]:  05/20/22  Time Telepsych consult ordered in [facility]:  1158  Location of Assessment: [facility] ED  Provider Location: [facility] Assessment Services   Collateral Involvement: Patient refused family to be called   Does Patient Have a [place]?  No  Legal Guardian Contact Information: No data recorded Copy of Legal Guardianship Form: No data recorded Legal Guardian Notified of Arrival: No data recorded Legal Guardian Notified of Pending Discharge: No data recorded If Minor and Not Living with Parent(s), Who has Custody? N/A  Is CPS involved or ever been involved? Never  Is APS involved or ever been involved? Never   Patient Determined To Be At Risk for Harm To Self or Others Based on Review of Patient Reported Information or Presenting Complaint? Yes, for Self-Harm  Method: No Plan  Availability of Means: No access or NA  Intent: Clearly intends on inflicting harm that could cause death  Notification Required: Another person is identifiable and needs to be warned to ensure safety (DUTY TO WARN)  Additional Information for Danger to Others Potential: No data recorded Additional Comments for Danger to Others Potential: Pt reports, he can get guns from gang member.  Are There Guns or Other Weapons in [place]? -- (Pt is homeless but has access to guns and knives.)  Types of Guns/Weapons: No data recorded Are These Weapons Safely Secured?                            No data recorded Who Could Verify You Are Able To Have These Secured: No data recorded Do You Have any Outstanding Charges, Pending Court Dates, Parole/Probation? No data recorded Contacted To Inform of Risk of Harm To Self or Others: Unable to Contact: (Attempted to contact mother to warn her pt wants to kill her brother but there was no answer)    Does Patient Present under Involuntary Commitment? No  IVC Papers Initial File Date: No data recorded  [place] of Residence: [place]   Patient Currently Receiving the Following Services: Not Receiving Services   Determination of Need: Urgent (48 hours)   Options For Referral: Chemical Dependency Intensive Outpatient Therapy (CDIOP); Medication Management; Outpatient Therapy     CCA  Biopsychosocial Patient Reported Schizophrenia/Schizoaffective Diagnosis in Past: No   Strengths: Pt says he has no strengths.   Mental Health Symptoms Depression:   Fatigue; Difficulty Concentrating; Change in energy/activity   Duration of Depressive symptoms:  Duration of Depressive Symptoms: Greater than two weeks   Mania:   None   Anxiety:    Worrying; Restlessness; Difficulty concentrating   Psychosis:   None   Duration of Psychotic symptoms:    Trauma:   None   Obsessions:   None   Compulsions:   None   Inattention:   Disorganized; Forgetful; Loses things   Hyperactivity/Impulsivity:   Feeling of restlessness   Oppositional/Defiant Behaviors:   Angry; Argumentative   Emotional Irregularity:   Recurrent suicidal behaviors/gestures/threats; Potentially harmful impulsivity   Other Mood/Personality Symptoms:   None noted    Mental Status Exam Appearance and self-care  Stature:   Tall   Weight:   Average weight   Clothing:   Disheveled   Grooming:   Neglected   Cosmetic use:   None   Posture/gait:   Stooped; Normal   Motor activity:  Agitated; Restless   Sensorium  Attention:   Distractible   Concentration:   Anxiety interferes   Orientation:   Place; Person; Situation   Recall/memory:   Defective in Short-term   Affect and Mood  Affect:   Anxious; Depressed   Mood:   Anxious; Depressed   Relating  Eye contact:   Normal   Facial expression:   Anxious; Depressed   Attitude toward examiner:   Cooperative   Thought and Language  Speech flow:  Flight of Ideas; Slurred; Garbled   Thought content:   Appropriate to Mood and Circumstances   Preoccupation:   Suicide   Hallucinations:   Auditory   Organization:  No data recorded  [employer] of Knowledge:   Fair   Intelligence:   Average   Abstraction:   Normal   Judgement:   Impaired; Poor   Reality Testing:   Distorted    Insight:   Poor; Shallow   Decision Making:   Impulsive   Social Functioning  Social Maturity:   Impulsive   Social Judgement:   "Games developer"; Victimized   Stress  Stressors:   Family conflict; Relationship   Coping Ability:   Deficient supports   Skill Deficits:   Lobbyist   Supports:   Support needed     Religion: Religion/Spirituality Are You A Religious Person?: Yes What is Your Religious Affiliation?: Christian How Might This Affect Treatment?: Not assessed  Leisure/Recreation: Leisure / Recreation Do You Have Hobbies?: No  Exercise/Diet: Exercise/Diet Do You Exercise?: No Have You Gained or Lost A Significant Amount of Weight in the Past Six Months?: No Do You Follow a Special Diet?: No Do You Have Any Trouble Sleeping?: Yes Explanation of Sleeping Difficulties: Has not been sleeping for last 2-3 days due to drug use.   CCA Employment/Education Employment/Work Situation: Employment / Work Situation Employment Situation: Unemployed Patient's Job has Been Impacted by Current Illness: No Has Patient ever Been in Passenger transport manager?: No  Education: Education Last Grade Completed: 22 Did [place]?: No Did You Have An Individualized Education Program (IIEP): No Did You Have Any Difficulty At [employer]?: No Patient's Education Has Been Impacted by Current Illness: No   CCA Family/Childhood History Family and Relationship History: Family history Marital status: Single Does patient have children?: Yes How many children?: 2 How is patient's relationship with their children?: Pt has no relationship w/ his children.  Childhood History:  Childhood History By whom was/is the patient raised?: Mother Did patient suffer from severe childhood neglect?: No Has patient ever been sexually abused/assaulted/raped as an adolescent or adult?: No Was the patient ever a victim of a crime or a disaster?: No Witnessed domestic violence?:  No Has patient been affected by domestic violence as an adult?: No  Child/Adolescent Assessment:     CCA Substance Use Alcohol/Drug Use: Alcohol / Drug Use Pain Medications: None Prescriptions: Pt is unsure. Over the Counter: None History of alcohol / drug use?: Yes Longest period of sobriety (when/how long): Per chart, pt provided he had accomplished 28 days of sobriety Negative Consequences of Use: Legal, Personal relationships Withdrawal Symptoms: None Substance #1 Name of Substance 1: cocaine 1 - Age of First Use: 18 1 - Amount (size/oz): 100.00 1 - Frequency: 1-2 x month 1 - Duration: since onset 1 - Last Use / Amount: $100 1 week ago 1 - Method of Aquiring: off the street 1- Route of Use: smoke Substance #2 Name of Substance 2: marijuana  2 - Age of First Use: Adolescent 2 - Amount (size/oz): $20 2 - Frequency: 2-3 times per week 2 - Duration: Ongoing 2 - Last Use / Amount: 1 week ago 2 - Method of Aquiring: off the street 2 - Route of Substance Use: smoke                     ASAM's:  Six Dimensions of Multidimensional Assessment  Dimension 1:  Acute Intoxication and/or Withdrawal Potential:   Dimension 1:  Description of individual's past and current experiences of substance use and withdrawal: Patient does not identify any current withdrawal symptoms  Dimension 2:  Biomedical Conditions and Complications:   Dimension 2:  Description of patient's biomedical conditions and  complications: Patient states that he has multiple medical issues compromised by his use of drugs and alcohol  Dimension 3:  Emotional, Behavioral, or Cognitive Conditions and Complications:  Dimension 3:  Description of emotional, behavioral, or cognitive conditions and complications: Patient states that he has been diagnosed with schizoaffective disorder and is currently non-compliant with his medication  Dimension 4:  Readiness to Change:  Dimension 4:  Description of Readiness to  Change criteria: Patient has been a chronic relapser and resistant to changing the thinge he needs to change in order to be successful in recovery  Dimension 5:  Relapse, Continued use, or Continued Problem Potential:  Dimension 5:  Relapse, continued use, or continued problem potential critiera description: Patient has failed to stay clean and sober after treatment attempts  Dimension 6:  Recovery/Living Environment:  Dimension 6:  Recovery/Iiving environment criteria description: Patient states he lives with his mother  ASAM Severity Score: ASAM's Severity Rating Score: 14  ASAM Recommended Level of Treatment: ASAM Recommended Level of Treatment: Level III Residential Treatment   Substance use Disorder (SUD) Substance Use Disorder (SUD)  Checklist Symptoms of Substance Use: Continued use despite having a persistent/recurrent physical/psychological problem caused/exacerbated by use, Continued use despite persistent or recurrent social, interpersonal problems, caused or exacerbated by use, Evidence of tolerance, Evidence of withdrawal (Comment), Large amounts of time spent to obtain, use or recover from the substance(s), Persistent desire or unsuccessful efforts to cut down or control use, Recurrent use that results in a failure to fulfill major role obligations (work, school, home), Presence of craving or strong urge to use, Repeated use in physically hazardous situations, Social, occupational, recreational activities given up or reduced due to use, Substance(s) often taken in larger amounts or over longer times than was intended  Recommendations for Services/Supports/Treatments: Recommendations for Services/Supports/Treatments Recommendations For Services/Supports/Treatments: CD-IOP Intensive Chemical Dependency Program, IT trainer (Assertive Community Treatment), Individual Therapy, Medication Management, Peer Support, Peer Support Services, Transitional Living, SAIOP (Substance Abuse Intensive Outpatient  Program), CST [employer] Support Team), Residential-Level 3  Discharge Disposition:    DSM5 Diagnoses: Patient Active Problem List   Diagnosis Date Noted   Right carpal tunnel syndrome 04/30/2022   Cubital tunnel syndrome on right 04/30/2022   Cocaine use 04/21/2022   Primary osteoarthritis of right hip 03/26/2022   Chronic pain of right knee 02/23/2022   Chronic right hip pain 02/23/2022   Diabetes (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/2021   Schizoaffective disorder ([place]) 02/11/2020   Schizophrenia ([place]) 02/10/2020   Hep C w/o coma, chronic ([place]) 10/21/2015   Injury of right Achilles tendon 10/17/2015   Tinea pedis 10/17/2015   Onychomycosis of toenail 10/17/2015   Secondary adrenal insufficiency ([place]) 10/17/2015   Diabetes mellitus type 2 in obese ([place])    Alcohol use disorder, mild, abuse 09/18/2015   Cannabis use disorder, mild, abuse 09/18/2015   Opioid use disorder, moderate, dependence ([place]) 09/18/2015   Tobacco use disorder 09/18/2015   Cocaine use disorder, severe, dependence ([place]) 09/16/2015   Schizoaffective disorder, depressive type ([place])    Suicidal ideation 05/07/2015   Polysubstance dependence ([place]) 05/07/2015   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[facility])    Chronic pain syndrome 03/11/2015   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   Chronic headache 01/02/2015   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   Essential hypertension 10/21/2014   Morbid obesity with BMI of 40.0-44.9, adult ([place]) 10/21/2014    Sleep apnea    Arthritis    Diabetes type 2, uncontrolled    Adrenal insufficiency ([place]) 12/23/2005   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

## 2022-05-20 NOTE — ED Notes (Signed)
Patient spoke with social worker on the phone.

## 2022-05-20 NOTE — TOC CAGE-AID Note (Signed)
Transition of Care [facility]) - CAGE-AID Screening   Patient Details  Name: Broxton Broady MRN: [id number] Date of Birth: 12/07/1975  Transition of Care [facility]) CM/SW Contact:    Vandora Jaskulski C Tarpley-Carter, [place] Phone Number: 05/20/2022, 7:14 PM   Clinical Narrative: Pt refuse to participated in [place].  Pt stated he does use substance.  Pt was offered resources, due to no usage of substance.  Pt stated he goes to court tomorrow and he may be going to jail, so he does not need resources right now.  CSW will provide resources in AVS.  Dereonna Lensing Tarpley-Carter, MSW, LCSW-A Pronouns:  She/Her/Hers [facility] HealthTransitions of Care Clinical Social Worker Direct Number:  [phone] [email or web address]      CAGE-AID Screening: Substance Abuse Screening unable to be completed due to: : Patient Refused             Substance Abuse Education Offered: Yes  Substance abuse interventions: Scientist, clinical (histocompatibility and immunogenetics)

## 2022-05-20 NOTE — ED Notes (Signed)
Cab called for patient, staff in lobby given cab voucher.

## 2022-05-20 NOTE — ED Provider Notes (Signed)
[facility] EMERGENCY DEPARTMENT Provider Note   CSN: [id number] Arrival date & time: 05/20/22  0427     History  Chief Complaint  Patient presents with   GERD / Fall     Suicidal     Duane Salazar is a 46 y.o. male.  Patient presents to the hospital complaining of a fall with right-sided hip and hand pain.  He further endorses suicidal ideation with a plan to use a gun.  He states he has attempted suicide in the past.  He denies losing consciousness or hitting his head during the fall.  States that he has had multiple falls due to chronic right hip pain.  During this most recent fall he fell directly on the right hip.  He complains of right-sided hand pain but states that he has carpal tunnel syndrome and is scheduled for surgery in October.  At this time he is denying chest pain, GERD symptoms, shortness of breath, nausea, vomiting, abdominal pain.  He endorses SI with a plan to shoot himself but denies hallucinations or homicidal ideations.  Past medical history significant for type II DM, sleep apnea, hypertension, schizophrenia, drug abuse, hepatitis C, arthritis, pituitary macroadenoma, acromegaly, right carpal tunnel syndrome, cocaine use, malingering, suicidal ideations  HPI     Home Medications Prior to Admission medications   Medication Sig Start Date End Date Taking? Authorizing Provider  amLODipine (NORVASC) 10 MG tablet Take 1 tablet (10 mg total) by mouth daily. 03/26/22  Yes Ladell Pier, MD  benztropine (COGENTIN) 0.5 MG tablet Take 1 tablet (0.5 mg total) by mouth 2 (two) times daily. 12/14/21  Yes Nicholes Rough, NP  celecoxib (CELEBREX) 200 MG capsule Take 1 capsule (200 mg total) by mouth daily. 03/26/22  Yes Ladell Pier, MD  chlorproMAZINE (THORAZINE) 25 MG tablet Take 75 mg by mouth 2 (two) times daily.   Yes [provider]  gabapentin (NEURONTIN) 400 MG capsule Take 1 capsule (400 mg total) by mouth 3 (three) times daily.  04/28/22 06/27/22 Yes Ladell Pier, MD  metFORMIN (GLUCOPHAGE) 500 MG tablet Take 1 tablet (500 mg total) by mouth 2 (two) times daily with a meal. 03/26/22  Yes Ladell Pier, MD  predniSONE (DELTASONE) 5 MG tablet Take 1 tablet (5 mg total) by mouth daily with breakfast. 03/26/22  Yes Ladell Pier, MD  traZODone (DESYREL) 100 MG tablet Take 1 tablet (100 mg total) by mouth at bedtime as needed for sleep. 05/07/22  Yes Ladell Pier, MD  chlorproMAZINE (THORAZINE) 50 MG tablet Take 3 tablets (150 mg total) by mouth at bedtime. Patient not taking: Reported on 04/20/2022 12/14/21   Nicholes Rough, NP  HYDROcodone-acetaminophen (NORCO/VICODIN) 5-325 MG tablet Take 1 tablet by mouth every 6 (six) hours as needed for severe pain. Patient not taking: Reported on 04/20/2022 04/02/22   Azucena Cecil, PA-C  hydrOXYzine (ATARAX) 25 MG tablet Take 1 tablet (25 mg total) by mouth every 6 (six) hours as needed for anxiety. Patient not taking: Reported on 04/20/2022 12/14/21   Nicholes Rough, NP  traMADol (ULTRAM) 50 MG tablet Take 1 tablet (50 mg total) by mouth every 12 (twelve) hours as needed. Patient not taking: Reported on 05/20/2022 04/09/22   Aundra Dubin, PA-C      Allergies    Patient has no known allergies.    Review of Systems   Review of Systems  Respiratory:  Negative for shortness of breath.   Cardiovascular:  Negative for  chest pain.  Gastrointestinal:  Negative for abdominal pain, nausea and vomiting.  Musculoskeletal:  Positive for arthralgias.  Psychiatric/Behavioral:  Positive for suicidal ideas.     Physical Exam Updated Vital Signs BP (!) 122/97 (BP Location: Right Arm)   Pulse 88   Temp 98 F (36.7 C)   Resp 18   SpO2 100%  Physical Exam Vitals and nursing note reviewed.  Constitutional:      General: He is not in acute distress.    Appearance: He is well-developed.  HENT:     Head: Normocephalic and atraumatic.  Eyes:     Conjunctiva/sclera:  Conjunctivae normal.  Cardiovascular:     Rate and Rhythm: Normal rate and regular rhythm.     Heart sounds: No murmur heard. Pulmonary:     Effort: Pulmonary effort is normal. No respiratory distress.     Breath sounds: Normal breath sounds.  Abdominal:     Palpations: Abdomen is soft.     Tenderness: There is no abdominal tenderness.  Musculoskeletal:        General: No swelling or tenderness.     Cervical back: Neck supple.     Comments: Pain with passive flexion of the right hip  Skin:    General: Skin is warm and dry.     Capillary Refill: Capillary refill takes less than 2 seconds.  Neurological:     Mental Status: He is alert.     ED Results / Procedures / Treatments   Labs (all labs ordered are listed, but only abnormal results are displayed) Labs Reviewed  COMPREHENSIVE METABOLIC PANEL - Abnormal; Notable for the following components:      Result Value   CO2 20 (*)    Glucose, Bld 126 (*)    All other components within normal limits  CBC WITH DIFFERENTIAL/PLATELET - Abnormal; Notable for the following components:   RBC 6.32 (*)    MCV 74.4 (*)    MCH 24.2 (*)    RDW 17.2 (*)    All other components within normal limits  ETHANOL - Abnormal; Notable for the following components:   Alcohol, Ethyl (B) 133 (*)    All other components within normal limits  RAPID URINE DRUG SCREEN, HOSP PERFORMED - Abnormal; Notable for the following components:   Cocaine POSITIVE (*)    Tetrahydrocannabinol POSITIVE (*)    All other components within normal limits  ACETAMINOPHEN LEVEL - Abnormal; Notable for the following components:   Acetaminophen (Tylenol), Serum <10 (*)    All other components within normal limits  SALICYLATE LEVEL - Abnormal; Notable for the following components:   Salicylate Lvl <4.2 (*)    All other components within normal limits  RESP PANEL BY RT-PCR (FLU A&B, COVID) ARPGX2  LIPASE, BLOOD  TROPONIN I (HIGH SENSITIVITY)  TROPONIN I (HIGH SENSITIVITY)     EKG EKG Interpretation  Date/Time:  Thursday May 20 2022 04:54:34 EDT Ventricular Rate:  88 PR Interval:  182 QRS Duration: 102 QT Interval:  354 QTC Calculation: 428 R Axis:   -4 Text Interpretation: Normal sinus rhythm Cannot rule out Anterior infarct , age undetermined  similar to Aug 2023 Confirmed by Sherwood Gambler [phone]) on 05/20/2022 7:27:21 AM  Radiology CT Hip Right Wo Contrast  Result Date: 05/20/2022 CLINICAL DATA:  Right hip pain. EXAM: CT OF THE RIGHT HIP WITHOUT CONTRAST TECHNIQUE: Multidetector CT imaging of the right hip was performed according to the standard protocol. Multiplanar CT image reconstructions were also generated. RADIATION DOSE REDUCTION:  This exam was performed according to the departmental dose-optimization program which includes automated exposure control, adjustment of the mA and/or kV according to patient size and/or use of iterative reconstruction technique. COMPARISON:  Right hip x-rays from same day. FINDINGS: Bones/Joint/Cartilage No fracture or dislocation. Severe right hip joint space narrowing superiorly with bone-on-bone apposition, subchondral sclerosis, and subchondral cystic change. Large joint effusion. Ligaments Ligaments are suboptimally evaluated by CT. Muscles and Tendons Grossly intact. Soft tissue No fluid collection or hematoma.  No soft tissue mass. IMPRESSION: 1.  No acute osseous abnormality. 2. Severe right hip osteoarthritis with large joint effusion. 3. Radiopaque density seen on today's x-ray is not visualized on CT. Electronically Signed   By: Titus Dubin M.D.   On: 05/20/2022 09:12   DG Hip Unilat With Pelvis 2-3 Views Right  Result Date: 05/20/2022 CLINICAL DATA:  Right hip pain. EXAM: DG HIP (WITH OR WITHOUT PELVIS) 2-3V RIGHT COMPARISON:  04/02/2022. FINDINGS: SI joints and symphysis pubis unremarkable. Degenerative disc disease noted lower lumbar spine. There is essentially complete loss of joint space in the right  hip with subchondral sclerosis and subchondral cyst formation. No evidence for an acute fracture. A geometric radiodensity measuring 15 mm projects over the right inferior pubic ramus on all 3 films, new in the interval but indeterminate etiology. IMPRESSION: 1. Severe degenerative changes in the right hip. No femoral neck fracture evident. 2. 15 mm radiopaque density noted just inferior to the right acetabulum new in the interval but indeterminate and potentially external to the patient. CT imaging could be used to further evaluate as clinically warranted. Electronically Signed   By: Misty Stanley M.D.   On: 05/20/2022 05:37   DG Chest 2 View  Result Date: 05/20/2022 CLINICAL DATA:  Chest pain.  Right hip pain EXAM: CHEST - 2 VIEW COMPARISON:  02/07/2021 FINDINGS: Normal heart size and mediastinal contours. Low volume chest. No acute infiltrate or edema. No effusion or pneumothorax. No acute osseous findings. IMPRESSION: No active cardiopulmonary disease. Electronically Signed   By: Jorje Guild M.D.   On: 05/20/2022 05:34    Procedures Procedures    Medications Ordered in ED Medications  acetaminophen (TYLENOL) tablet 650 mg (has no administration in time range)  alum & mag hydroxide-simeth (MAALOX/MYLANTA) 200-200-20 MG/5ML suspension 30 mL (30 mLs Oral Given 05/20/22 0453)    And  lidocaine (XYLOCAINE) 2 % viscous mouth solution 15 mL (15 mLs Oral Given 05/20/22 0453)  famotidine (PEPCID) tablet 20 mg (20 mg Oral Given 05/20/22 1610)  HYDROcodone-acetaminophen (NORCO/VICODIN) 5-325 MG per tablet 1 tablet (1 tablet Oral Given 05/20/22 9604)    ED Course/ Medical Decision Making/ A&P                           Medical Decision Making Amount and/or Complexity of Data Reviewed Radiology: ordered.  Risk OTC drugs. Prescription drug management.   The patient presents to the hospital with a chief complaint of right hip pain.  Differential diagnosis includes fracture, dislocation,  osteoarthritis, avascular necrosis, and others  The patient also states he has plans to kill himself by shooting himself.  He denies HI, hallucinations.  IVC paperwork filled out by earlier provider.  I reviewed the patient's past medical history and noted multiple visits for suicidal ideations and right hip pain.  I ordered and reviewed labs.  Pertinent results include UDS positive for cocaine and THC, alcohol 133, unremarkable lipase, unremarkable CBC, unremarkable CMP, negative troponins  I ordered and personally interpreted imaging including plain films of the chest and right hip, and CT of the right hip.  No active cardiopulmonary disease noted. 1. Severe degenerative changes in the right hip. No femoral neck  fracture evident.  2. 15 mm radiopaque density noted just inferior to the right  acetabulum new in the interval but indeterminate and potentially  external to the patient. CT imaging could be used to further  evaluate as clinically warranted.  CT ordered to follow-up right hip images. 1.  No acute osseous abnormality.  2. Severe right hip osteoarthritis with large joint effusion.  3. Radiopaque density seen on today's x-ray is not visualized on CT.  I agree with the radiologist findings  I ordered the patient hydrocodone for pain which seemed to improve his symptoms.  The patient is medically clear at this time for evaluation by TTS.  The patient's hip pain is likely inflammation due to the recent fall combined with his severe underlying osteoarthritis.  The patient is followed by orthopedics currently for his carpal tunnel syndrome.  He may follow-up with their office as needed for treatment for the right hip after discharge        Final Clinical Impression(s) / ED Diagnoses Final diagnoses:  Osteoarthritis of right hip, unspecified osteoarthritis type  Suicidal ideation    Rx / DC Orders ED Discharge Orders     None         Ronny Bacon 05/20/22  1118    Sherwood Gambler, MD 05/21/22 [phone]

## 2022-05-20 NOTE — Discharge Instructions (Addendum)
You were seen in the emergency department for your hip pain and suicidal ideation.  You have arthritis of your hip that is likely causing your pain.  Psychiatry evaluated you and would like you to follow-up with psychiatry as an outpatient.  Have given you the outpatient behavioral health center that you can follow-up with in the next few days.  Also given you a list of resources for substance use.  You should return to the emergency department if you are having thoughts of wanting to hurt yourself or others, or if you have any other new or concerning symptoms.

## 2022-05-20 NOTE — ED Notes (Signed)
TTS initiated, in progress

## 2022-05-20 NOTE — ED Notes (Signed)
Belongings in locker #2

## 2022-05-20 NOTE — ED Notes (Signed)
Pt alert, NAD, calm, interactive, resps e/u, speaking in clear complete sentences. C/o HA, R hand and hip pain, and acid reflux, and also agitation and anxiousness. Denies SI.

## 2022-05-20 NOTE — ED Notes (Signed)
TTS machine set up. [facility] notified, pt ready.

## 2022-05-20 NOTE — ED Notes (Addendum)
Patient's mother updated again on the plan of care. Patient's mother continues to want patient to be admitted involuntarily, and not discharged. This information relayed to DO Kneller who is at bedside reassessing patient. Patient has been calm and cooperative with this RN and staff, denying SI/HI at this time.

## 2022-05-20 NOTE — ED Provider Notes (Signed)
Secretary approached me about this patient.  His IVC paperwork was missing.  This paperwork was located and does not appear to be signed by a physician and has not been faxed to the magistrate.  I went spoke with the patient and he denied any SI/HI or AVH.  I do not see an indication for filling out this paperwork at this time.  He has already been seen by behavioral health and we are awaiting their recommendations.  He will remain voluntary at this time.   Darliss Ridgel 05/20/22 1701    Tretha Sciara, MD 05/20/22 Bosie Helper

## 2022-06-01 NOTE — Progress Notes (Signed)
Based on pt's recent hospitalizations and multiple ED visits he is not a candidate for [facility]. Spoke with Jackelyn Poling at Dr Phoebe Sharps office notifying her of above.

## 2022-06-09 ENCOUNTER — Encounter (HOSPITAL_BASED_OUTPATIENT_CLINIC_OR_DEPARTMENT_OTHER): Payer: Self-pay

## 2022-06-09 ENCOUNTER — Ambulatory Visit (HOSPITAL_BASED_OUTPATIENT_CLINIC_OR_DEPARTMENT_OTHER): Admit: 2022-06-09 | Payer: Self-pay | Admitting: Orthopaedic Surgery

## 2022-06-09 SURGERY — CARPAL TUNNEL RELEASE
Anesthesia: Regional | Laterality: Right

## 2022-06-16 ENCOUNTER — Encounter: Payer: Self-pay | Admitting: Orthopaedic Surgery

## 2022-06-25 ENCOUNTER — Ambulatory Visit: Payer: Self-pay | Admitting: Internal Medicine

## 2022-06-30 DEATH — deceased

## 2022-09-15 ENCOUNTER — Other Ambulatory Visit: Payer: Self-pay

## 2023-05-15 IMAGING — CR DG HIP (WITH OR WITHOUT PELVIS) 2-3V*R*
3 series · 3 of 3 positions shown · non-contrast
Comparison: None.

CLINICAL DATA: Chronic right hip pain

EXAM:
DG HIP (WITH OR WITHOUT PELVIS) 2-3V RIGHT

[pelvis ap]
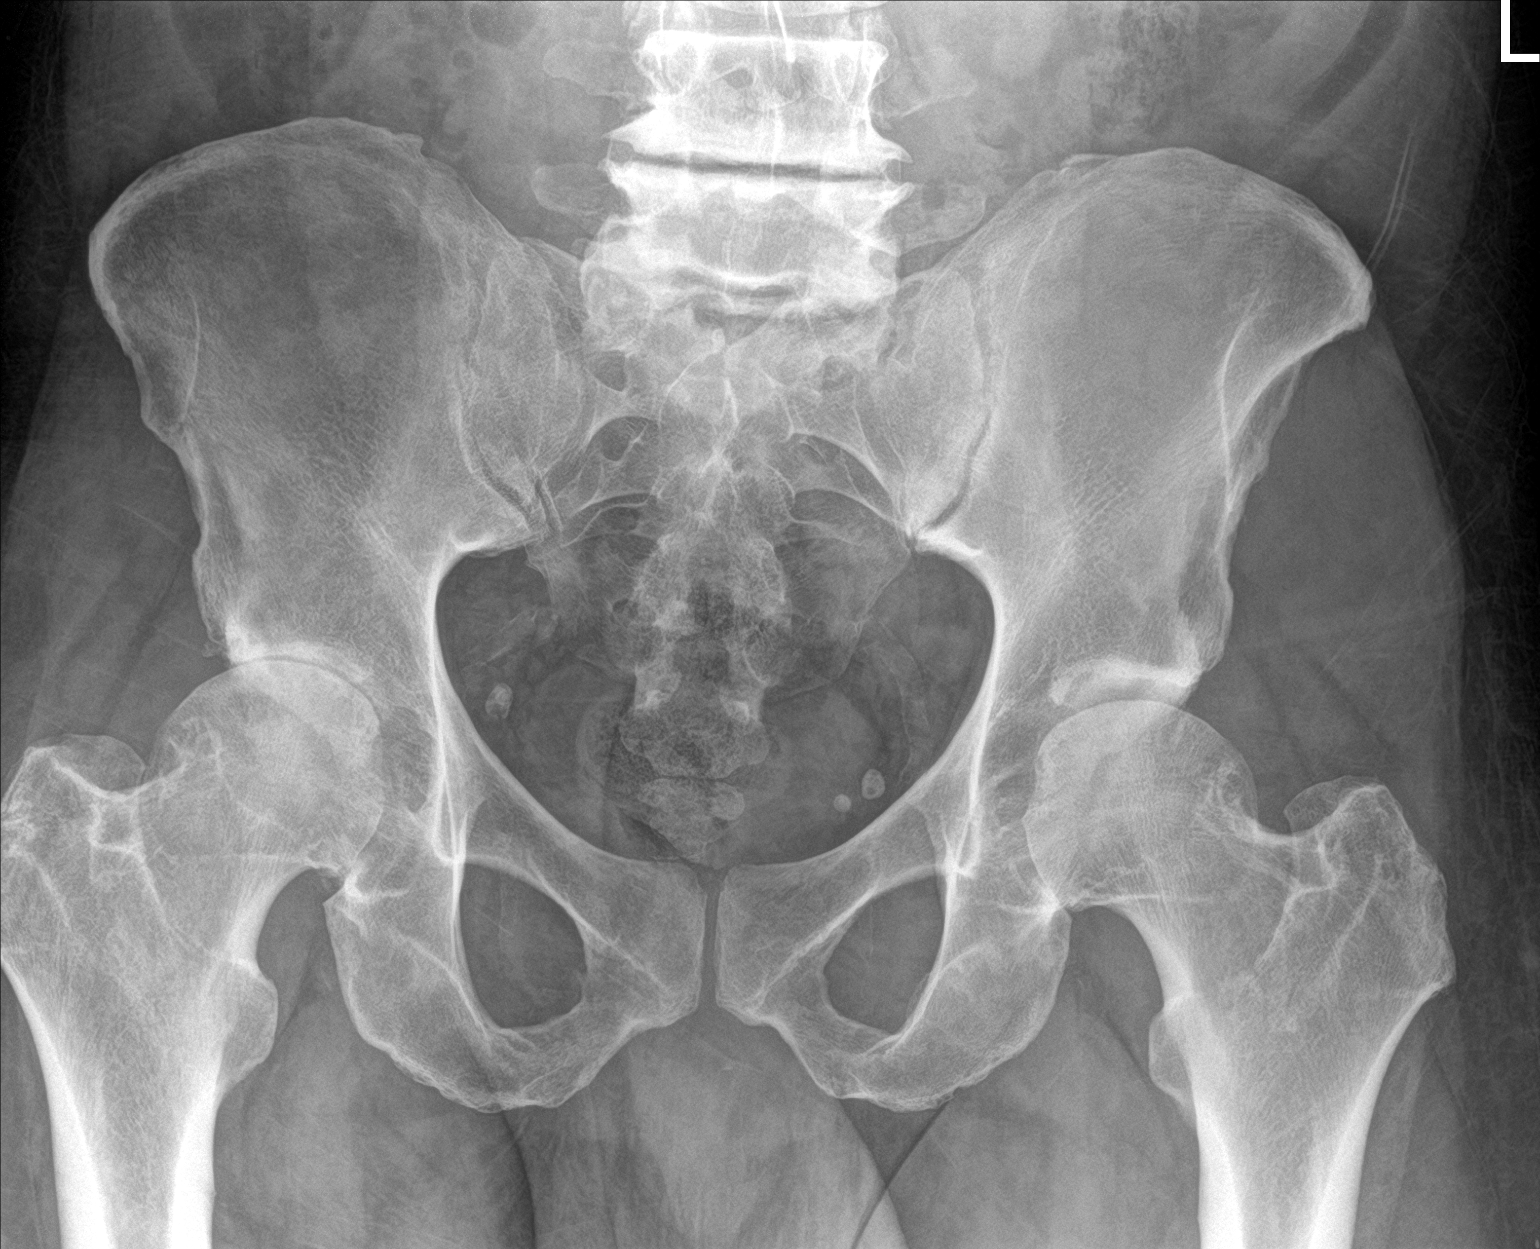

[hip ap]
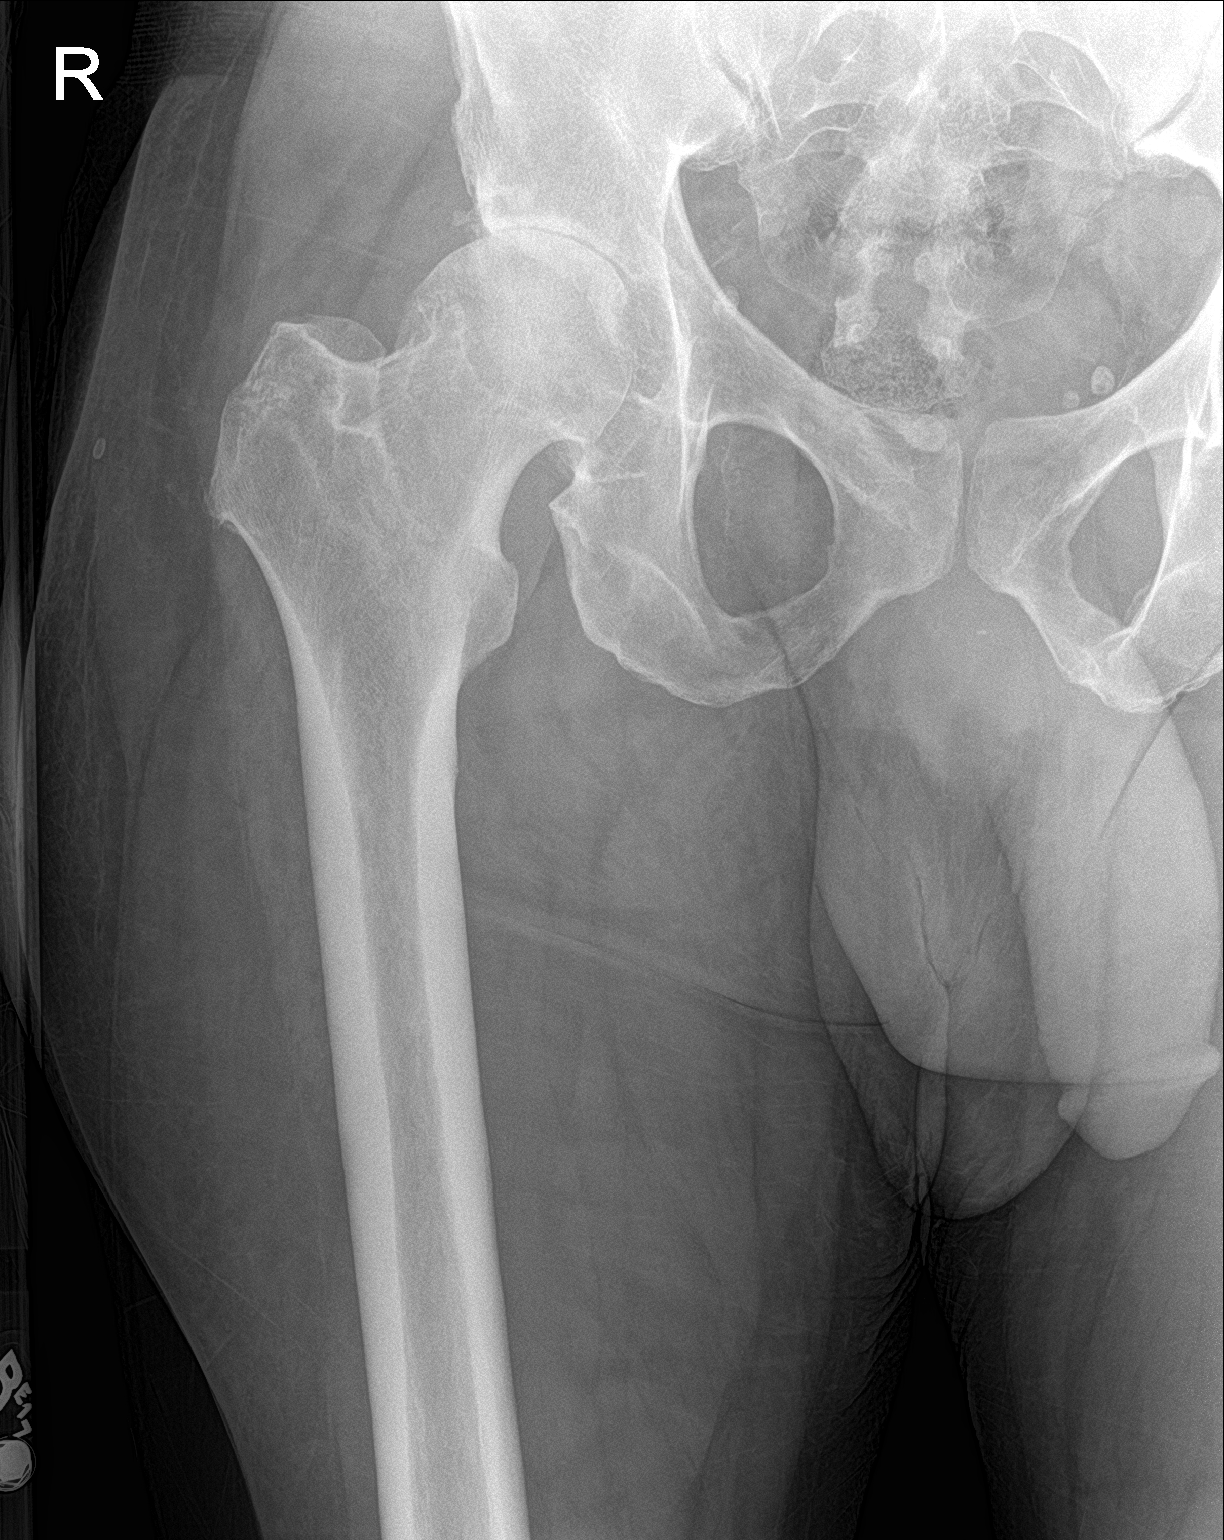

[hip lat]
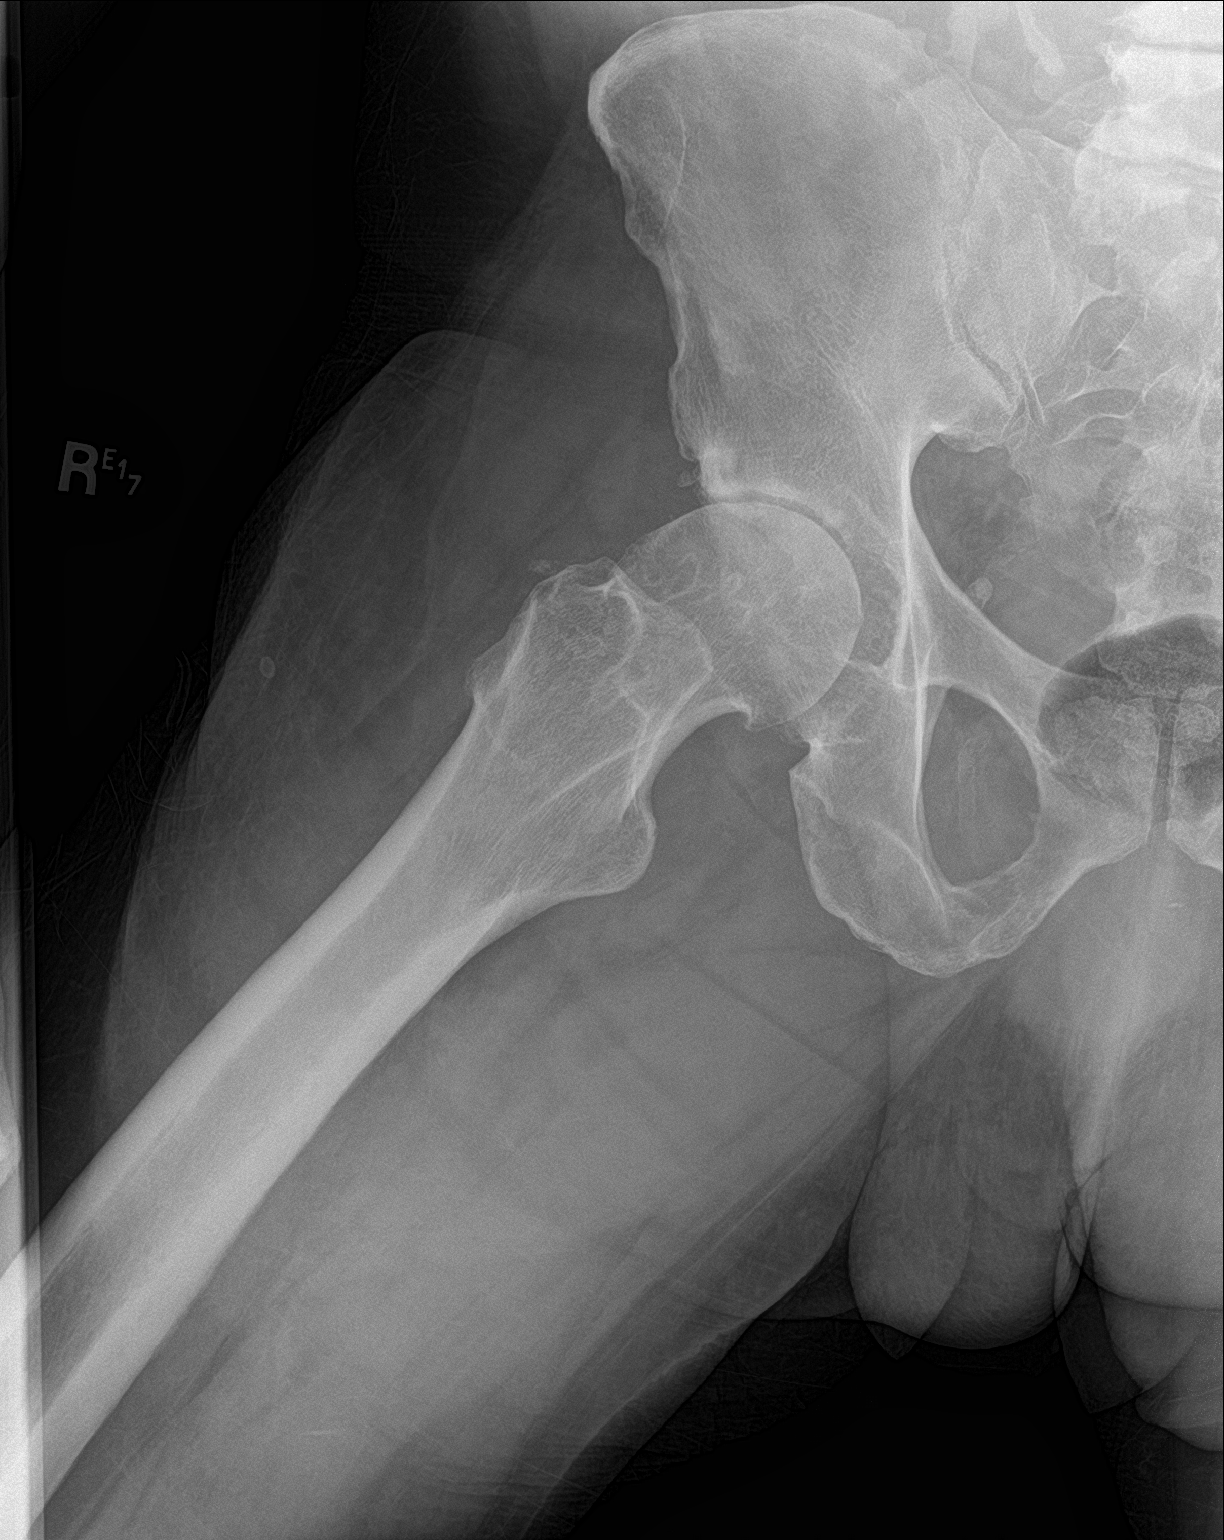

[3 of 3 positions shown; findings below may reference images not displayed]

FINDINGS: No recent fracture or dislocation is seen. There are no focal lytic
lesions. There is marked joint space narrowing in the right hip.
Small bony spurs seen. Degenerative changes are noted in the
visualized lower lumbar spine with disc space narrowing, bony spurs
and facet hypertrophy.
IMPRESSION: No recent fracture or dislocation is seen. Degenerative changes are
noted with marked joint space narrowing and bony spurs in the right
hip. Lumbar spondylosis.

## 2023-07-01 IMAGING — CT CT HEAD W/O CM
3 series · 15 of 47 positions shown, 18 images · non-contrast
Comparison: September 11, 2015.

CLINICAL DATA: Weakness.



[Series 3: head 5.0 h30s · axial · 0.47mm/px · z∈[-181,-46]mm · 9 of 33 slices shown, 12 images]
[im 3/33  brain]
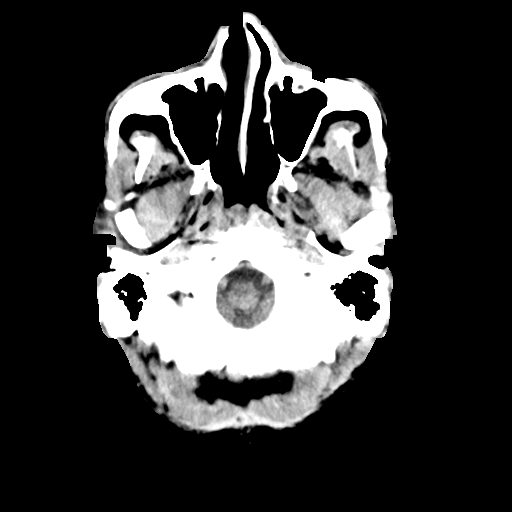
[im 3/33  bone]
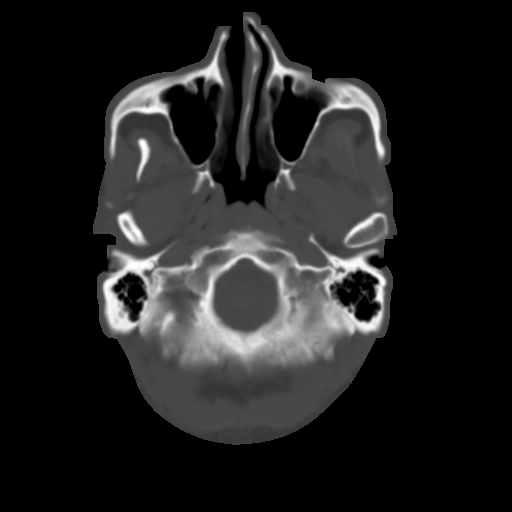
[im 6/33  brain]
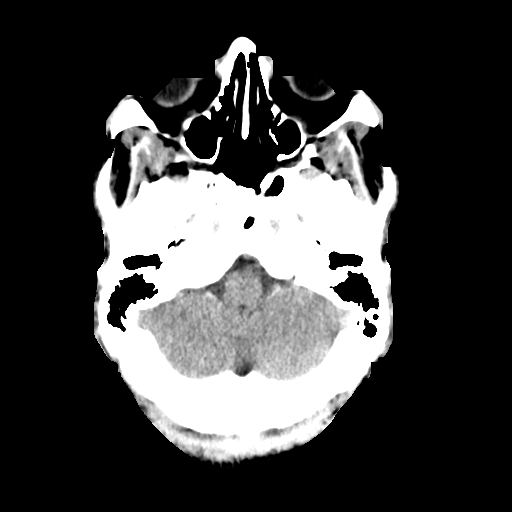
[im 9/33  brain]
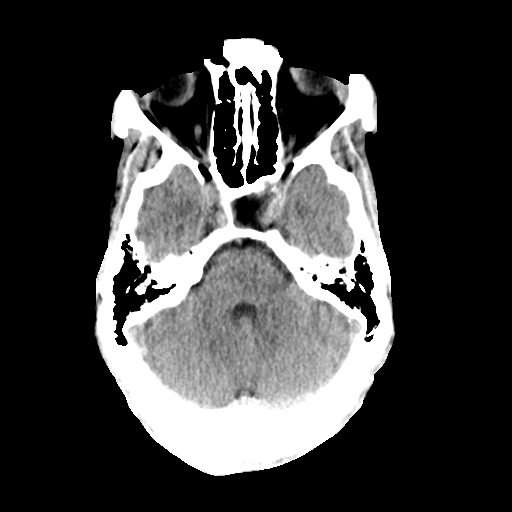
[im 13/33  brain]
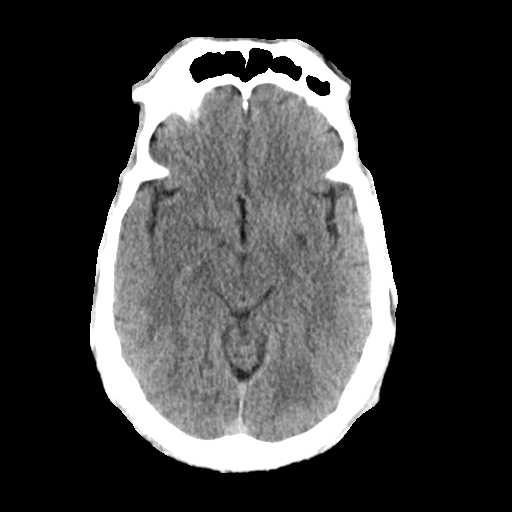
[im 17/33  brain]
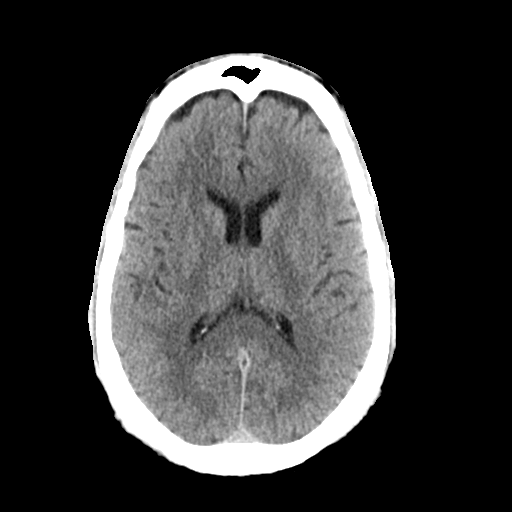
[im 17/33  bone]
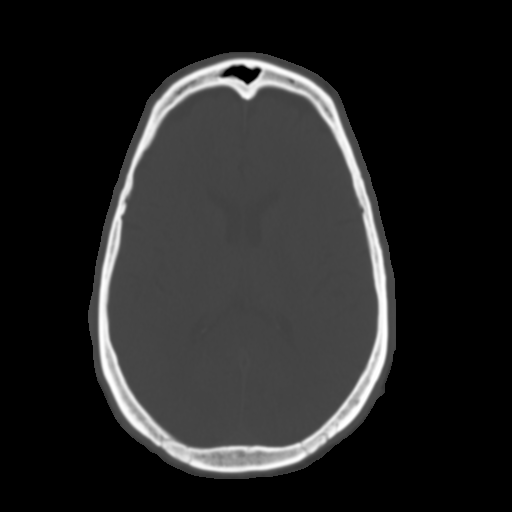
[im 20/33  brain]
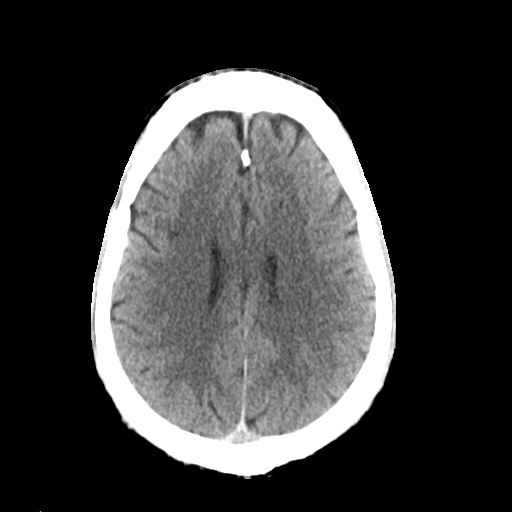
[im 24/33  brain]
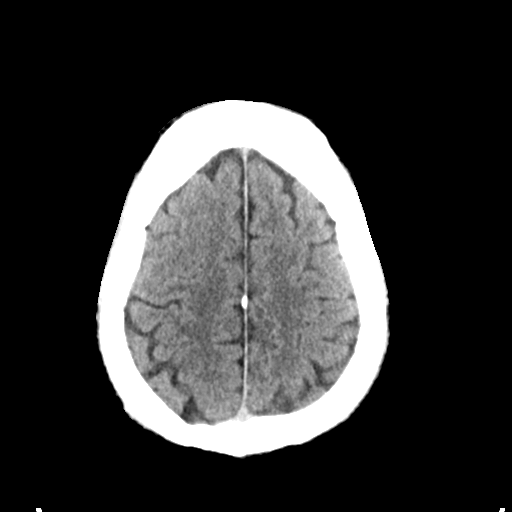
[im 27/33  brain]
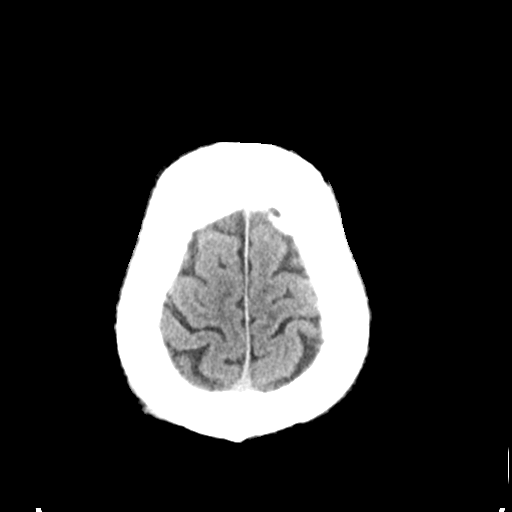
[im 30/33  brain]
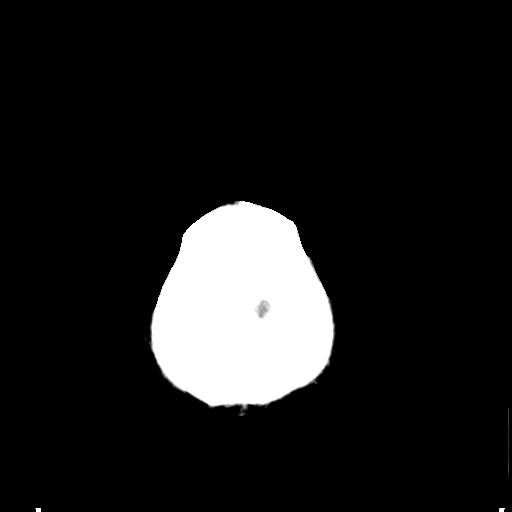
[im 30/33  bone]
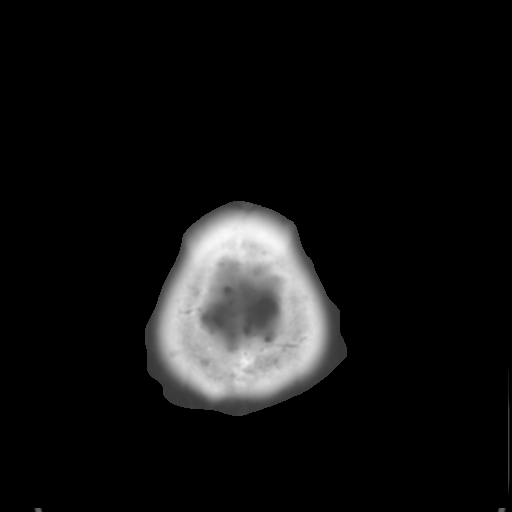

[Series 5: head 3.0 mpr cor · coronal · 0.32mm/px · 3 of 75 slices shown]
[im 25/75  brain]
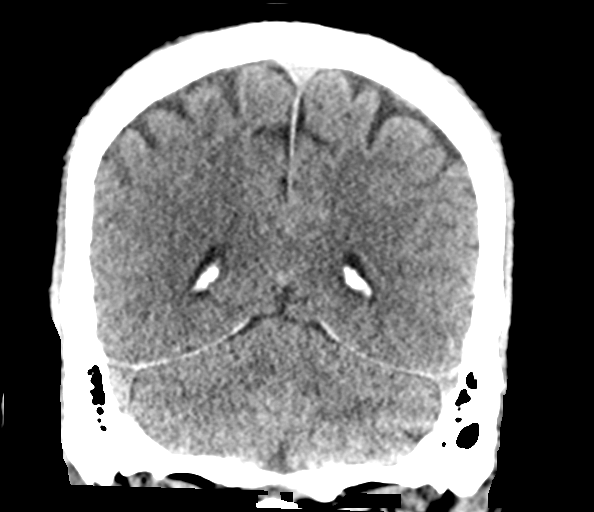
[im 33/75  brain]
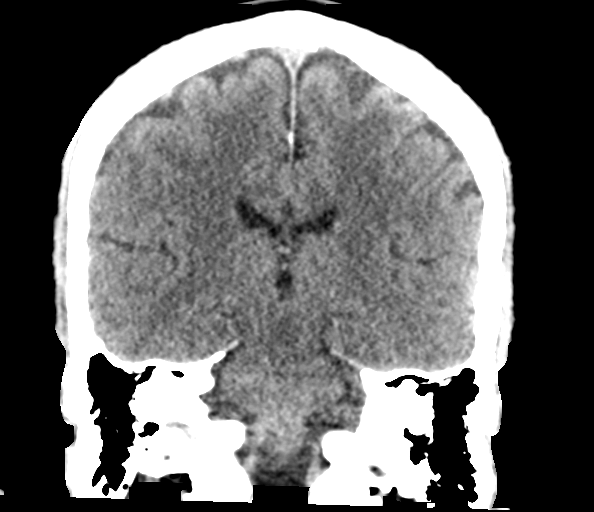
[im 42/75  brain]
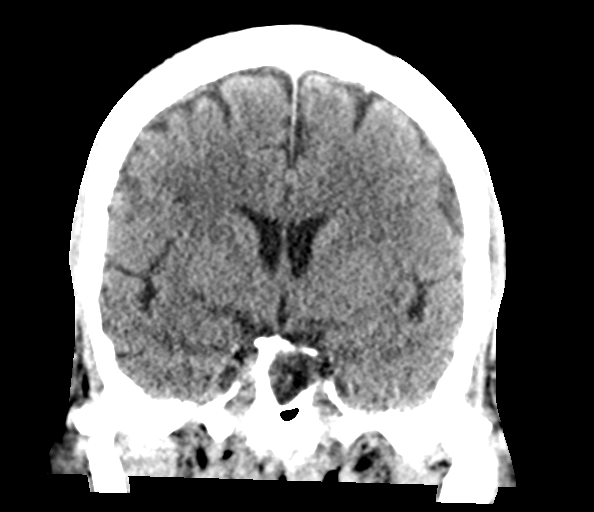

[Series 6: head 3.0 mpr sag · sagittal · 0.32mm/px · 3 of 67 slices shown]
[im 23/67  brain]
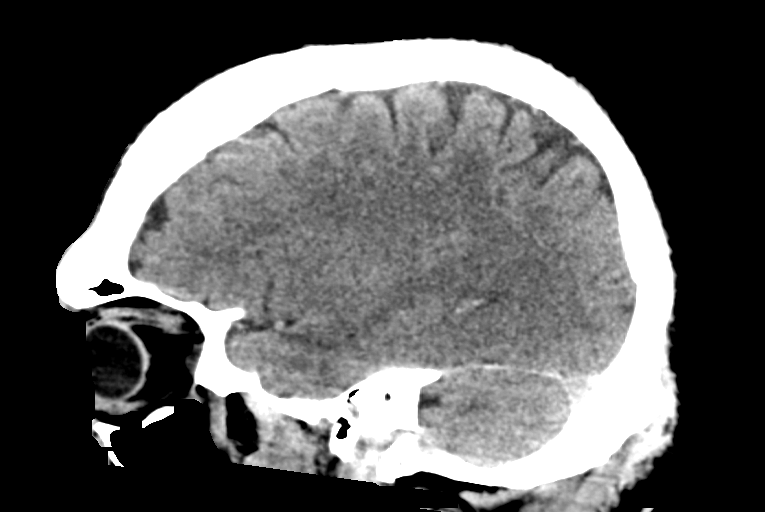
[im 34/67  brain]
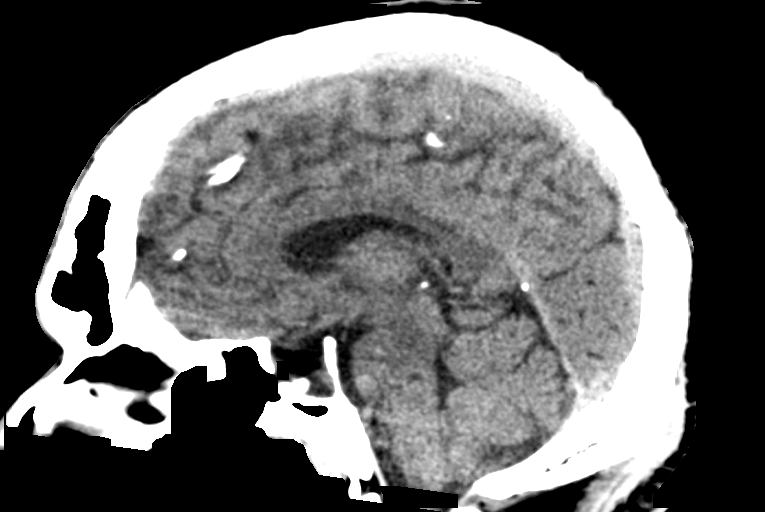
[im 45/67  brain]
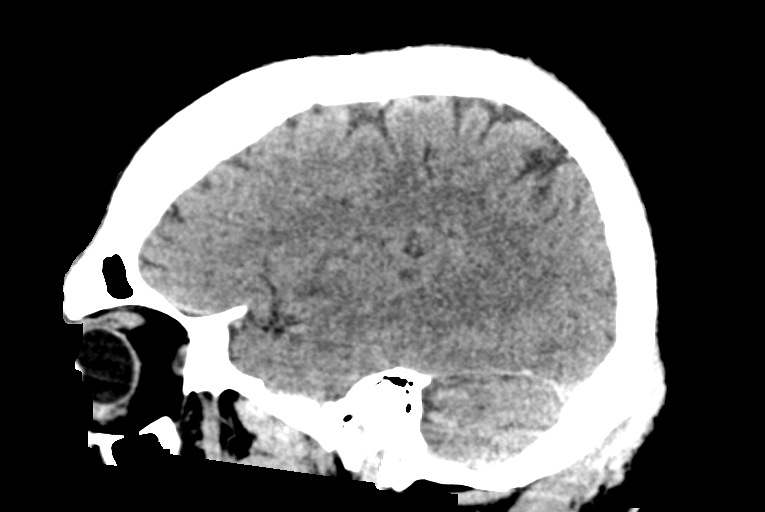

[15 of 47 positions shown; findings below may reference images not displayed]

FINDINGS: Brain: No evidence of acute infarction, hemorrhage, hydrocephalus,
extra-axial collection or mass lesion/mass effect.

Vascular: No hyperdense vessel or unexpected calcification.

Skull: Normal. Negative for fracture or focal lesion.

Sinuses/Orbits: No acute finding.

Other: None.
IMPRESSION: No acute intracranial abnormality seen.
